# Patient Record
Sex: Female | Born: 1945
Health system: Southern US, Community
[De-identification: ages and names within clinical notes are randomized; demographics above are authoritative.]

## PROBLEM LIST (undated history)

## (undated) DIAGNOSIS — M1990 Unspecified osteoarthritis, unspecified site: Secondary | ICD-10-CM

## (undated) DIAGNOSIS — I4510 Unspecified right bundle-branch block: Secondary | ICD-10-CM

## (undated) DIAGNOSIS — E785 Hyperlipidemia, unspecified: Secondary | ICD-10-CM

## (undated) DIAGNOSIS — Z8673 Personal history of transient ischemic attack (TIA), and cerebral infarction without residual deficits: Secondary | ICD-10-CM

## (undated) DIAGNOSIS — E119 Type 2 diabetes mellitus without complications: Secondary | ICD-10-CM

## (undated) DIAGNOSIS — F32A Depression, unspecified: Secondary | ICD-10-CM

## (undated) DIAGNOSIS — I2510 Atherosclerotic heart disease of native coronary artery without angina pectoris: Secondary | ICD-10-CM

## (undated) DIAGNOSIS — E039 Hypothyroidism, unspecified: Secondary | ICD-10-CM

## (undated) DIAGNOSIS — I471 Supraventricular tachycardia, unspecified: Secondary | ICD-10-CM

## (undated) DIAGNOSIS — F319 Bipolar disorder, unspecified: Secondary | ICD-10-CM

## (undated) DIAGNOSIS — E538 Deficiency of other specified B group vitamins: Secondary | ICD-10-CM

## (undated) DIAGNOSIS — K219 Gastro-esophageal reflux disease without esophagitis: Secondary | ICD-10-CM

## (undated) DIAGNOSIS — E063 Autoimmune thyroiditis: Secondary | ICD-10-CM

## (undated) DIAGNOSIS — I10 Essential (primary) hypertension: Secondary | ICD-10-CM

## (undated) HISTORY — DX: Essential (primary) hypertension: I10

## (undated) HISTORY — DX: Personal history of transient ischemic attack (TIA), and cerebral infarction without residual deficits: Z86.73

## (undated) HISTORY — DX: Bipolar disorder, unspecified: F31.9

## (undated) HISTORY — DX: Type 2 diabetes mellitus without complications: E11.9

## (undated) HISTORY — DX: Autoimmune thyroiditis: E06.3

## (undated) HISTORY — DX: Hyperlipidemia, unspecified: E78.5

## (undated) HISTORY — DX: Deficiency of other specified B group vitamins: E53.8

## (undated) HISTORY — DX: Gastro-esophageal reflux disease without esophagitis: K21.9

## (undated) HISTORY — DX: Supraventricular tachycardia, unspecified: I47.10

## (undated) HISTORY — DX: Supraventricular tachycardia: I47.1

---

## 1997-10-03 ENCOUNTER — Other Ambulatory Visit: Admission: RE | Admit: 1997-10-03 | Discharge: 1997-10-03 | Payer: Self-pay | Admitting: Obstetrics and Gynecology

## 1998-08-14 ENCOUNTER — Ambulatory Visit (HOSPITAL_COMMUNITY): Admission: RE | Admit: 1998-08-14 | Discharge: 1998-08-14 | Payer: Self-pay | Admitting: Gastroenterology

## 1998-11-20 ENCOUNTER — Other Ambulatory Visit: Admission: RE | Admit: 1998-11-20 | Discharge: 1998-11-20 | Payer: Self-pay | Admitting: Obstetrics and Gynecology

## 1999-10-09 ENCOUNTER — Encounter: Payer: Self-pay | Admitting: Obstetrics and Gynecology

## 1999-10-09 ENCOUNTER — Other Ambulatory Visit: Admission: RE | Admit: 1999-10-09 | Discharge: 1999-10-09 | Payer: Self-pay | Admitting: Obstetrics and Gynecology

## 1999-10-09 ENCOUNTER — Encounter: Admission: RE | Admit: 1999-10-09 | Discharge: 1999-10-09 | Payer: Self-pay | Admitting: Obstetrics and Gynecology

## 2000-10-31 ENCOUNTER — Encounter: Payer: Self-pay | Admitting: Obstetrics and Gynecology

## 2000-10-31 ENCOUNTER — Encounter: Admission: RE | Admit: 2000-10-31 | Discharge: 2000-10-31 | Payer: Self-pay | Admitting: Obstetrics and Gynecology

## 2000-10-31 ENCOUNTER — Other Ambulatory Visit: Admission: RE | Admit: 2000-10-31 | Discharge: 2000-10-31 | Payer: Self-pay | Admitting: Obstetrics and Gynecology

## 2001-04-13 ENCOUNTER — Encounter: Payer: Self-pay | Admitting: Family Medicine

## 2001-04-13 ENCOUNTER — Ambulatory Visit (HOSPITAL_COMMUNITY): Admission: RE | Admit: 2001-04-13 | Discharge: 2001-04-13 | Payer: Self-pay | Admitting: Family Medicine

## 2001-06-29 ENCOUNTER — Encounter: Payer: Self-pay | Admitting: Gastroenterology

## 2001-06-29 ENCOUNTER — Encounter: Admission: RE | Admit: 2001-06-29 | Discharge: 2001-06-29 | Payer: Self-pay | Admitting: Gastroenterology

## 2001-11-16 ENCOUNTER — Other Ambulatory Visit: Admission: RE | Admit: 2001-11-16 | Discharge: 2001-11-16 | Payer: Self-pay | Admitting: Obstetrics and Gynecology

## 2001-12-06 ENCOUNTER — Encounter: Admission: RE | Admit: 2001-12-06 | Discharge: 2001-12-06 | Payer: Self-pay | Admitting: Obstetrics and Gynecology

## 2001-12-06 ENCOUNTER — Encounter: Payer: Self-pay | Admitting: Obstetrics and Gynecology

## 2002-11-27 ENCOUNTER — Other Ambulatory Visit: Admission: RE | Admit: 2002-11-27 | Discharge: 2002-11-27 | Payer: Self-pay | Admitting: Obstetrics and Gynecology

## 2002-11-27 ENCOUNTER — Encounter: Admission: RE | Admit: 2002-11-27 | Discharge: 2002-11-27 | Payer: Self-pay | Admitting: Obstetrics and Gynecology

## 2003-12-09 ENCOUNTER — Encounter: Admission: RE | Admit: 2003-12-09 | Discharge: 2003-12-09 | Payer: Self-pay | Admitting: Obstetrics and Gynecology

## 2005-03-17 ENCOUNTER — Ambulatory Visit (HOSPITAL_COMMUNITY): Admission: RE | Admit: 2005-03-17 | Discharge: 2005-03-17 | Payer: Self-pay | Admitting: Family Medicine

## 2006-10-31 ENCOUNTER — Ambulatory Visit (HOSPITAL_COMMUNITY): Admission: RE | Admit: 2006-10-31 | Discharge: 2006-10-31 | Payer: Self-pay | Admitting: Family Medicine

## 2007-01-26 HISTORY — PX: THYROIDECTOMY: SHX17

## 2007-07-18 ENCOUNTER — Encounter: Admission: RE | Admit: 2007-07-18 | Discharge: 2007-07-18 | Payer: Self-pay | Admitting: Endocrinology

## 2007-07-25 ENCOUNTER — Encounter: Admission: RE | Admit: 2007-07-25 | Discharge: 2007-07-25 | Payer: Self-pay | Admitting: Endocrinology

## 2007-07-25 ENCOUNTER — Other Ambulatory Visit: Admission: RE | Admit: 2007-07-25 | Discharge: 2007-07-25 | Payer: Self-pay | Admitting: Interventional Radiology

## 2007-07-25 ENCOUNTER — Encounter (INDEPENDENT_AMBULATORY_CARE_PROVIDER_SITE_OTHER): Payer: Self-pay | Admitting: Interventional Radiology

## 2007-10-19 ENCOUNTER — Encounter: Payer: Self-pay | Admitting: Cardiology

## 2007-10-24 ENCOUNTER — Inpatient Hospital Stay (HOSPITAL_COMMUNITY): Admission: RE | Admit: 2007-10-24 | Discharge: 2007-10-25 | Payer: Self-pay | Admitting: Surgery

## 2007-10-27 ENCOUNTER — Ambulatory Visit (HOSPITAL_COMMUNITY): Admission: RE | Admit: 2007-10-27 | Discharge: 2007-10-27 | Payer: Self-pay | Admitting: Surgery

## 2008-01-26 HISTORY — PX: LAPAROSCOPIC CHOLECYSTECTOMY: SUR755

## 2008-05-01 ENCOUNTER — Ambulatory Visit (HOSPITAL_COMMUNITY): Admission: RE | Admit: 2008-05-01 | Discharge: 2008-05-01 | Payer: Self-pay | Admitting: Family Medicine

## 2008-05-07 ENCOUNTER — Encounter: Admission: RE | Admit: 2008-05-07 | Discharge: 2008-05-07 | Payer: Self-pay | Admitting: Surgery

## 2008-05-31 ENCOUNTER — Encounter (INDEPENDENT_AMBULATORY_CARE_PROVIDER_SITE_OTHER): Payer: Self-pay | Admitting: Surgery

## 2008-05-31 ENCOUNTER — Ambulatory Visit (HOSPITAL_COMMUNITY): Admission: RE | Admit: 2008-05-31 | Discharge: 2008-06-02 | Payer: Self-pay | Admitting: Surgery

## 2008-05-31 ENCOUNTER — Encounter: Payer: Self-pay | Admitting: Cardiology

## 2009-04-22 ENCOUNTER — Encounter: Admission: RE | Admit: 2009-04-22 | Discharge: 2009-04-22 | Payer: Self-pay | Admitting: Neurology

## 2009-04-22 ENCOUNTER — Encounter: Payer: Self-pay | Admitting: Cardiology

## 2009-05-31 ENCOUNTER — Encounter: Payer: Self-pay | Admitting: Cardiology

## 2009-09-30 ENCOUNTER — Encounter: Payer: Self-pay | Admitting: Cardiology

## 2009-11-06 ENCOUNTER — Ambulatory Visit: Payer: Self-pay | Admitting: Cardiology

## 2009-11-06 DIAGNOSIS — Z8669 Personal history of other diseases of the nervous system and sense organs: Secondary | ICD-10-CM | POA: Insufficient documentation

## 2009-11-06 DIAGNOSIS — R002 Palpitations: Secondary | ICD-10-CM | POA: Insufficient documentation

## 2009-11-06 DIAGNOSIS — I10 Essential (primary) hypertension: Secondary | ICD-10-CM | POA: Insufficient documentation

## 2009-11-06 DIAGNOSIS — E785 Hyperlipidemia, unspecified: Secondary | ICD-10-CM | POA: Insufficient documentation

## 2010-02-16 ENCOUNTER — Encounter: Payer: Self-pay | Admitting: Family Medicine

## 2010-02-18 ENCOUNTER — Other Ambulatory Visit: Payer: Self-pay | Admitting: Gastroenterology

## 2010-02-24 NOTE — Consult Note (Signed)
Summary: Consultation Report  Consultation Report   Imported By: Dorise Hiss 11/06/2009 10:19:16  _____________________________________________________________________  External Attachment:    Type:   Image     Comment:   External Document

## 2010-02-24 NOTE — Assessment & Plan Note (Signed)
Summary: NP-DIAPHOR - SOB   Visit Type:  Initial Consult Referring Provider:  Dr. Avie Echevaria Primary Provider:  Dr. Dwana Melena   History of Present Illness: 65 year old woman presents for cardiology consultation. Records are reviewed with history outlined below. She is referred specifically with complaints of episodic diaphoresis and palpitations. She correlates these symptoms to the duration of time that she has been on Plavix, beginning approximately 6 months ago. She describes sudden sweats that occur without known precipitant, no specific rash however. She also reports a feeling of a very forceful heartbeat, not necessarily rapid, and not clearly associated with dizziness or syncope. She states that she stopped Plavix with the approval of Dr. Sandria Manly over the last week or so, and indicates that the symptoms have improved. She states that she almost canceled this visit because she was feeling better.  I also reviewed other reported symptoms. She describes a fairly long-standing, recurrent discomfort in the right subcostal area. This is sporadic and nonexertional. She reports a long-standing history of recurrent syncope since the 1970s, possibly neurocardiogenic based on description. She reports ongoing problems with anxiety, followup with a psychiatrist, and in fact recent recommendation to advance Klonopin dosing. She experiences intermittent forgetfulness, no recent visual changes.  Ms. Dolder indicates that she has no health insurance, and is not particularly interested in pursuing any testing at this time.  She states that she underwent cardiac evaluation several years ago by Dr. Daleen Squibb, including stress testing which by her recollection was normal. I can locate no records at this time.  Preventive Screening-Counseling & Management  Alcohol-Tobacco     Smoking Status: quit     Year Quit: 1985     Pack years: 48yrs/ 1pk per day      Drug Use:  no.    Current Medications (verified): 1)   Lovastatin 20 Mg Tabs (Lovastatin) .... Take 1 Tablet By Mouth Once A Day 2)  Synthroid 100 Mcg Tabs (Levothyroxine Sodium) .... Take 1 Tablet By Mouth Once A Day 3)  Budeprion Xl 150 Mg Xr24h-Tab (Bupropion Hcl) .... Take 1 Tablet By Mouth Once A Day 4)  Align  Caps (Probiotic Product) .... Take 1 Tablet By Mouth Once A Day 5)  Dexilant 60 Mg Cpdr (Dexlansoprazole) .... Take 1 Tablet By Mouth Once A Day 6)  Venlafaxine Hcl 150 Mg Xr24h-Cap (Venlafaxine Hcl) .... Take 1 Tablet By Mouth Once A Day 7)  Promethazine Hcl 25 Mg Tabs (Promethazine Hcl) .... Take One By Mouth Every 4-6 Hours As Needed Nausea 8)  Hyomax-Sl 0.125 Mg Subl (Hyoscyamine Sulfate) .... Take 1 Tablet By Mouth Four Times A Day As Needed Cramps 9)  Clonazepam 1 Mg Tabs (Clonazepam) .... Take 1/2-1 Tablet By Mouth Three Times A Day 10)  Cyanocobalamin 1000 Mcg/ml Soln (Cyanocobalamin) .Marland Kitchen.. 1ml Sq Monthly 11)  Vitamin D 1000 Unit Tabs (Cholecalciferol) .... Take 1 Tablet By Mouth Once A Day 12)  Aleve 220 Mg Tabs (Naproxen Sodium) .... Take 1 Tablet By Mouth Two Times A Day 13)  Caltrate 600+d 600-400 Mg-Unit Tabs (Calcium Carbonate-Vitamin D) .... Take 1 Tablet By Mouth Once A Day 14)  Aspir-Low 81 Mg Tbec (Aspirin) .... Take 1 Tablet By Mouth Once A Day 15)  Folic Acid 800 Mcg Tabs (Folic Acid) .... Take 1 Tablet By Mouth Once A Day 16)  Omnaris 50 Mcg/act Susp (Ciclesonide) .... 2 Spray/nostrils Daily  Allergies (verified): 1)  ! Augmentin 2)  ! Sulfa 3)  ! Jonne Ply  Comments:  Nurse/Medical Assistant: The patient's medication bottles and allergies were reviewed with the patient and were updated in the Medication and Allergy Lists.  Past History:  Family History: Last updated: 11/06/2009 Father: committed suicide at age 52 Mother: died age 72 with stroke Siblings: Sister died age 48 with brain aneurysm, Brother with reported heart disease under age 64  Social History: Last updated: 11/06/2009 Alcohol Use - no Drug  Use - no Married - homemaker Tobacco Use - Former (quit 30 years ago)  Past Medical History: Hyperlipidemia Hypertension Bipolar disorder Hashimoto's thyroiditis Allergic Rhinitis G E R D History of TIA versus migraine as well as right amaurosis fugax and ataxia - Dr. Sandria Manly B12 deficiency  Past Surgical History: Laparoscopic cholecystectomy 2010 Thyroidectomy 2009  Family History: Father: committed suicide at age 48 Mother: died age 43 with stroke Siblings: Sister died age 26 with brain aneurysm, Brother with reported heart disease under age 62  Social History: Alcohol Use - no Drug Use - no Married - homemaker Tobacco Use - Former (quit 30 years ago) Drug Use:  no Smoking Status:  quit Pack years:  36yrs/ 1pk per day  Review of Systems  The patient denies anorexia, fever, chest pain, dyspnea on exertion, peripheral edema, prolonged cough, abdominal pain, melena, hematochezia, and severe indigestion/heartburn.         Reports cataracts in the past. Occasional joint aches. Eczema. Prior hemorrhoids. Otherwise negative except as already outlined above.  Vital Signs:  Patient profile:   65 year old female Height:      61 inches Weight:      176 pounds BMI:     33.38 Pulse rate:   71 / minute BP sitting:   118 / 68  (left arm) Cuff size:   large  Vitals Entered By: Carlye Grippe (November 06, 2009 2:31 PM)  Nutrition Counseling: Patient's BMI is greater than 25 and therefore counseled on weight management options.  Physical Exam  Additional Exam:  Overweight woman in no acute distress. HEENT: Conjunctiva and lids normal, oropharynx with moist mucosa. Neck: Supple, no elevated JVP or carotid bruits. Lungs: Clear to auscultation, nonlabored. Cardiac: Regular rate and rhythm, soft systolic murmur at the base with preserved second heart sound, no pericardial rub or S3 gallop. Abdomen: Soft, nontender, bowel sounds present. Skin: Warm and dry. Musculoskeletal: No  kyphosis. Extremities: No pitting edema, distal pulses full. Neuropsychiatric: Alert and oriented x3, animated but grossly appropriate affect.   Problems:  Medical Problems Added: 1)  Dx of Syncope, Hx of  (ICD-V12.49) 2)  Dx of Palpitations  (ICD-785.1) 3)  Dx of Bipolar Disorder Unspecified  (ICD-296.80) 4)  Dx of Ibs  (ICD-564.1) 5)  Dx of Fatigue  (ICD-780.79) 6)  Dx of Essential Hypertension, Benign  (ICD-401.1) 7)  Dx of Hyperlipidemia  (ICD-272.4) 8)  Dx of Dm  (ICD-250.00)  CXR  Procedure date:  10/19/2007  Findings:       Clinical Data: Preop for thyroid nodule    CHEST - 2 VIEW    Comparison: No priors    Findings: Heart and mediastinal contours normal.  Minimal scarring   at the right medial lung base.  No active process.  Osseous   structures intact.    IMPRESSION:   Minimal scarring at the right medial lung base - no active disease.    Read By:  Bernerd Limbo,  M.D.    EKG  Procedure date:  11/06/2009  Findings:      Normal sinus rhythm  at 74 beats per minute with normal intervals.  Impression & Recommendations:  Problem # 1:  PALPITATIONS (ICD-785.1)  Patient reports episodic feeling of a forceful heartbeat, not necessarily very rapid, and not specifically associated with dizziness or syncope. No clear precipitant for these symptoms. She does seem to relate them loosely to some episodes of diaphoresis that she has experienced over the last several months. The symptoms have improved having stopped Plavix approximately one week ago. While not a common complaint that I hear from patients related to Plavix, this is listed as a potential side effect. Resting electrocardiogram is normal. I did discuss with her the possibility of transient dysrhythmias, and the potential for further testing to include cardiac monitoring and possibly an echocardiogram. At this point she did not want to proceed with any additional testing and preferred observation off Plavix.  She will continue to followup with Dr. Margo Aye. If her symptoms progress, we can certainly see her back and discuss additional evaluation, assuming she is willing to proceed with further testing.  Her updated medication list for this problem includes:    Aspir-low 81 Mg Tbec (Aspirin) .Marland Kitchen... Take 1 tablet by mouth once a day  Problem # 2:  SYNCOPE, HX OF (ICD-V12.49)  Possibly neurocardiogenic based on patient's description. She has had recurrences dating back to the 1970s by her account.  Other Orders: EKG w/ Interpretation (93000)  Patient Instructions: 1)  Follow up as needed.

## 2010-02-24 NOTE — Letter (Signed)
Summary: External Correspondence/ OFFICE VISIT DR. ZACK HALL  External Correspondence/ OFFICE VISIT DR. ZACK HALL   Imported By: Dorise Hiss 10/21/2009 14:57:36  _____________________________________________________________________  External Attachment:    Type:   Image     Comment:   External Document

## 2010-02-24 NOTE — Op Note (Signed)
Summary: Operative Report  Operative Report   Imported By: Dorise Hiss 11/06/2009 10:22:03  _____________________________________________________________________  External Attachment:    Type:   Image     Comment:   External Document

## 2010-03-10 ENCOUNTER — Other Ambulatory Visit: Payer: Self-pay | Admitting: Gastroenterology

## 2010-03-10 DIAGNOSIS — R634 Abnormal weight loss: Secondary | ICD-10-CM

## 2010-03-13 ENCOUNTER — Ambulatory Visit
Admission: RE | Admit: 2010-03-13 | Discharge: 2010-03-13 | Disposition: A | Discharge status: Home / Self Care | Payer: Self-pay | Source: Ambulatory Visit | Attending: Gastroenterology | Admitting: Gastroenterology

## 2010-03-13 DIAGNOSIS — R634 Abnormal weight loss: Secondary | ICD-10-CM

## 2010-03-13 MED ORDER — IOHEXOL 300 MG/ML  SOLN
100.0000 mL | Freq: Once | INTRAMUSCULAR | Status: AC | PRN
  Administered 2010-03-13: 100 mL via INTRAVENOUS

## 2010-03-13 MED ORDER — IOHEXOL 300 MG/ML  SOLN: 100.0000 mL | Freq: Once | INTRAMUSCULAR | Status: AC | PRN

## 2010-04-21 ENCOUNTER — Other Ambulatory Visit: Payer: Self-pay | Admitting: Gastroenterology

## 2010-04-28 ENCOUNTER — Ambulatory Visit
Admission: RE | Admit: 2010-04-28 | Discharge: 2010-04-28 | Disposition: A | Discharge status: Home / Self Care | Payer: Self-pay | Source: Ambulatory Visit | Attending: Gastroenterology | Admitting: Gastroenterology

## 2010-05-05 LAB — COMPREHENSIVE METABOLIC PANEL
ALT: 11 U/L (ref 0–35)
ALT: 22 U/L (ref 0–35)
ALT: 23 U/L (ref 0–35)
AST: 17 U/L (ref 0–37)
AST: 33 U/L (ref 0–37)
AST: 59 U/L — ABNORMAL HIGH (ref 0–37)
Albumin: 2.9 g/dL — ABNORMAL LOW (ref 3.5–5.2)
Albumin: 3.2 g/dL — ABNORMAL LOW (ref 3.5–5.2)
Alkaline Phosphatase: 72 U/L (ref 39–117)
Alkaline Phosphatase: 76 U/L (ref 39–117)
Alkaline Phosphatase: 81 U/L (ref 39–117)
BUN: 3 mg/dL — ABNORMAL LOW (ref 6–23)
BUN: 3 mg/dL — ABNORMAL LOW (ref 6–23)
BUN: 4 mg/dL — ABNORMAL LOW (ref 6–23)
CO2: 27 mEq/L (ref 19–32)
CO2: 28 mEq/L (ref 19–32)
Calcium: 8.5 mg/dL (ref 8.4–10.5)
Calcium: 8.6 mg/dL (ref 8.4–10.5)
Calcium: 9 mg/dL (ref 8.4–10.5)
Chloride: 104 mEq/L (ref 96–112)
Chloride: 105 mEq/L (ref 96–112)
Chloride: 105 mEq/L (ref 96–112)
Creatinine, Ser: 0.7 mg/dL (ref 0.4–1.2)
Creatinine, Ser: 0.74 mg/dL (ref 0.4–1.2)
GFR calc Af Amer: >60 mL/min (ref >60)
GFR calc Af Amer: >60 mL/min (ref >60)
GFR calc non Af Amer: >60 mL/min (ref >60)
GFR calc non Af Amer: >60 mL/min (ref >60)
GFR calc non Af Amer: >60 mL/min (ref >60)
Glucose, Bld: 105 mg/dL — ABNORMAL HIGH (ref 70–99)
Glucose, Bld: 120 mg/dL — ABNORMAL HIGH (ref 70–99)
Potassium: 3.6 mEq/L (ref 3.5–5.1)
Potassium: 3.7 mEq/L (ref 3.5–5.1)
Sodium: 137 mEq/L (ref 135–145)
Sodium: 138 mEq/L (ref 135–145)
Total Bilirubin: 0.4 mg/dL (ref 0.3–1.2)
Total Bilirubin: 0.5 mg/dL (ref 0.3–1.2)
Total Bilirubin: 0.7 mg/dL (ref 0.3–1.2)
Total Protein: 5.8 g/dL — ABNORMAL LOW (ref 6.0–8.3)
Total Protein: 5.9 g/dL — ABNORMAL LOW (ref 6.0–8.3)
Total Protein: 6.6 g/dL (ref 6.0–8.3)

## 2010-05-05 LAB — HEPATIC FUNCTION PANEL
ALT: 24 U/L (ref 0–35)
Bilirubin, Direct: 0.1 mg/dL (ref 0.0–0.3)
Indirect Bilirubin: 0.6 mg/dL (ref 0.3–0.9)
Total Protein: 5.8 g/dL — ABNORMAL LOW (ref 6.0–8.3)

## 2010-05-05 LAB — CBC
HCT: 32.9 % — ABNORMAL LOW (ref 36.0–46.0)
HCT: 33.8 % — ABNORMAL LOW (ref 36.0–46.0)
Hemoglobin: 11.5 g/dL — ABNORMAL LOW (ref 12.0–15.0)
MCHC: 34.2 g/dL (ref 30.0–36.0)
MCHC: 34.5 g/dL (ref 30.0–36.0)
MCHC: 35.6 g/dL (ref 30.0–36.0)
MCV: 88.3 fL (ref 78.0–100.0)
MCV: 89.3 fL (ref 78.0–100.0)
Platelets: 338 10*3/uL (ref 150–400)
Platelets: 349 10*3/uL (ref 150–400)
RBC: 3.62 MIL/uL — ABNORMAL LOW (ref 3.87–5.11)
RBC: 4.18 MIL/uL (ref 3.87–5.11)
RDW: 13.3 % (ref 11.5–15.5)
RDW: 13.6 % (ref 11.5–15.5)
RDW: 13.7 % (ref 11.5–15.5)
WBC: 16.7 10*3/uL — ABNORMAL HIGH (ref 4.0–10.5)
WBC: 17.1 10*3/uL — ABNORMAL HIGH (ref 4.0–10.5)
WBC: 8.5 10*3/uL (ref 4.0–10.5)

## 2010-05-05 LAB — URINALYSIS, ROUTINE W REFLEX MICROSCOPIC
Bilirubin Urine: NEGATIVE
Glucose, UA: NEGATIVE mg/dL
Hgb urine dipstick: NEGATIVE
Ketones, ur: NEGATIVE mg/dL
Nitrite: NEGATIVE
Protein, ur: NEGATIVE mg/dL
Urobilinogen, UA: 0.2 mg/dL (ref 0.0–1.0)

## 2010-05-05 LAB — DIFFERENTIAL
Basophils Relative: 1 % (ref 0–1)
Lymphs Abs: 2.5 10*3/uL (ref 0.7–4.0)
Monocytes Absolute: 0.6 10*3/uL (ref 0.1–1.0)
Monocytes Relative: 8 % (ref 3–12)
Neutro Abs: 4.9 10*3/uL (ref 1.7–7.7)
Neutrophils Relative %: 58 % (ref 43–77)

## 2010-05-05 LAB — TYPE AND SCREEN: ABO/RH(D): A POS

## 2010-05-05 LAB — LIPASE, BLOOD: Lipase: 13 U/L (ref 11–59)

## 2010-06-09 NOTE — Consult Note (Signed)
NAMENEIDY, GUERRIERI NO.:  [id number]   MEDICAL RECORD NO.:  [id number]          PATIENT TYPE:  OIB   LOCATION:  1601                         FACILITY:  [facility]   PHYSICIAN:  Bernette Redbird, M.D.   DATE OF BIRTH:  07-07-1945   DATE OF CONSULTATION:  05/31/2008  DATE OF DISCHARGE:                                 CONSULTATION   REFERRING PHYSICIAN:  Dr. Cyndia Bent   We were asked to see Ms. Cina today in consultation for common bile  duct stones by Dr. Ephriam Knuckles strep of the surgery service.   HISTORY OF PRESENT ILLNESS:  This is a 65 year old female with a history  of diabetes mellitus, manic depression, total thyroidectomy done  September 2009 who reports episodes of epigastric pain in right upper  quadrant, abdominal pain with nausea, increasing gas and bloating,  particularly after eating.  She also reports significant dysphagia to  pills as well as solids, sometimes regurgitating her foods and  medicines.  The patient had a total thyroidectomy in September 2009,  still having symptoms of tightness in the area of her surgery.  A recent  thyroid ultrasound was fortunately negative.  The patient tells me she  had a colonoscopy in 2000 by Dr. Tasia Catchings which was reported to be  normal.   PAST MEDICAL HISTORY:  Significant for hypertension, hyperlipidemia,  type 2 diabetes, IBS, manic depression, Hashimoto's thyroiditis.  She is  status post thyroidectomy October 24, 2007.  Her primary care  physician is Dr. Simone Curia in [place], and her endocrinologist  is Dr. Talmage Nap.   CURRENT MEDICATIONS:  Per chart but include no anticoagulants, no  antiplatelets.   She has allergies to FLAGYL, AUGMENTIN, SULFA AND ASPIRIN.   REVIEW OF SYSTEMS:  Significant for some hearing loss, fatigue,  occasional palpitations, joint pain and tightness in her neck.   SOCIAL HISTORY:  Negative for tobacco and alcohol.   FAMILY HISTORY:  Negative for GI  diseases.   PHYSICAL EXAMINATION:  GENERAL:  She is alert and oriented, very  pleasant to speak with, in no apparent distress.  Her family is in the  room with her. CARDIOVASCULAR SYSTEM:  Mildly tachycardic but has no  murmurs, rubs or gallops.  LUNGS:  Clear to auscultation with no wheezes or crackles.  ABDOMEN:  Soft with no bowel sounds.  Her bandages are clean and dry.   LABORATORY DATA:  Labs done May 28, 2008, are significant for a  hemoglobin of 12.8, hematocrit 37.3, white count 8.5, platelets 427,000.  BMET is within normal limits.  AST 17, ALT 11, alk phos 81, total  bilirubin 0.4.   RADIOLOGICAL EXAMINATIONS:  Include an ultrasound of her abdomen done  May 01, 2008, that showed a gallbladder filled with stones.  Her common  bile duct at that time was 6 mm.  On May 07, 2008, she had an  ultrasound of her thyroid area, post thyroidectomy.  Ultrasound was  normal.  Today, May 31, 2008, she had an intraoperative cholangiogram  that was positive for filling defects suspicious for multiple common  bile duct stones.  ASSESSMENT:  Dr. Molly Maduro Buccini has seen and examined the patient,  collected history and reviewed her chart.  His impression is this is a  65 year old female, status post laparoscopic cholecystectomy today,  common bile duct stones on intraoperative cholangiogram, dysphagia to  solids and pills, thyroidectomy 8 months ago; still symptoms of  tightness remain in her thyroid area.   PLAN:  Upper endoscopy with possible dilation of her esophagus tomorrow.  Endoscopic retrograde cholangiopancreatography immediately following  endoscopy to remove common bile duct stones.  The procedures as well as  the risks, particularly of pancreatitis, sedation, perforation and  bleeding were explained to the patient.  She understands these risks and  consents to the procedures.   Thanks very much for this consultation.      Stephani Police, PA     ______________________________  Bernette Redbird, M.D.    MLY/MEDQ  D:  05/31/2008  T:  06/01/2008  Job:  [id number]   cc:   Donna Bernard, M.D.  Fax: [phone]   Currie Paris, M.D.  [address]  [place]  [place] [address]   Petra Kuba, M.D.  Fax: [phone]

## 2010-06-09 NOTE — Op Note (Signed)
[name], [name]              ACCOUNT NO.:  [id number]   MEDICAL RECORD NO.:  [id number]          PATIENT TYPE:  OIB   LOCATION:  1601                         FACILITY:  [facility]   PHYSICIAN:  John C. Madilyn Fireman, M.D.    DATE OF BIRTH:  1945-07-23   DATE OF PROCEDURE:  06/01/2008  DATE OF DISCHARGE:                               OPERATIVE REPORT   INDICATIONS FOR PROCEDURE:  Common bile duct stones seen on  intraoperative cholangiogram.   PROCEDURE:  The patient was placed in the prone position and placed on  the pulse monitor with continuous low-flow oxygen delivered by nasal  cannula.  She was sedated 100 mcg IV fentanyl, 8 mg IV Versed, 25 mg IV  Phenergan and 1 mg IV glucagon.  The Olympus side-viewing endoscope was  advanced blindly in the oropharynx, esophagus and stomach.  The pylorus  was traversed and papilla of Vater located on the medial duodenal wall.  He had a normal appearance and was cannulated with Wilson-Cook  sphincterotome.  No pancreatic duct cannulation was performed.  A  cholangiogram was obtained which showed a dilated duct and one moderate  sized stone and two or three small fragments.  A large sphincterotomy  was performed, and the stone and smaller fragments were removed.  The  last cholangiogram showed a lot of air from the balloon having popped  but no obvious visible residual stones.  There was good drainage at the  end of the procedure.  Scope was then withdrawn, and the patient  returned to the recovery room in stable condition.  She tolerated the  procedure well.  There were no immediate complications.   IMPRESSION:  Common bile duct stones removed after sphincterotomy.           ______________________________  Everardo All Madilyn Fireman, M.D.     JCH/MEDQ  D:  06/01/2008  T:  06/01/2008  Job:  [id number]   cc:   Currie Paris, M.D.  [address]  [place]  [place] [address]

## 2010-06-09 NOTE — Op Note (Signed)
[name], PARCHMENT NO.:  [id number]   MEDICAL RECORD NO.:  [id number]          PATIENT TYPE:  OIB   LOCATION:  1601                         [facility]ACILITY:  [facility]   PHYSICIAN:  Currie Paris, M.D.DATE OF BIRTH:  [birth year]   DATE OF PROCEDURE:  05/31/2008  DATE OF DISCHARGE:                               OPERATIVE REPORT   OFFICE MEDICAL RECORD NUMBER:  [id number].   PREOPERATIVE DIAGNOSIS:  Chronic calculus cholecystitis.   POSTOPERATIVE DIAGNOSES:  1. Chronic calculus cholecystitis.  2. Choledocholithiasis.   OPERATION PERFORMED:  Laparoscopic cholecystectomy with operative  cholangiogram.   SURGEON:  Dr. Jamey Ripa.   ASSISTANT:  Dr. Zachery Dakins.   ANESTHESIA:  General endotracheal.   CLINICAL HISTORY:  This is a 65 year old lady with biliary type symptoms  and gallstones found.  After discussion with the patient, she elected to  have a cholecystectomy.  The gallbladder ultrasound showed what looked  like a wall echo shadow suggestive of a gallbladder filled with stones.   DESCRIPTION OF PROCEDURE:  The patient was seen in the holding area and  she had no further questions.  She confirmed that cholecystectomy was  the planned procedure.   The patient was taken to the operating room after satisfactory general  endotracheal anesthesia had been obtained.  The abdomen was prepped and  draped and a timeout done.   I started by making an incision in the umbilicus, entering the  peritoneal cavity under direct vision and placing a Hassan.  I used  local for all incisions.   The gallbladder was contracted and covered with omentum.  A 10-11 trocar  was placed in the epigastrium and two 5s laterally.  The patient was  placed in reversed Trendelenburg and tilted to the left.   These adhesions of the omentum were fairly dense and the gallbladder was  quite contracted, white appearing and very firm, consistent with a  gallbladder filled with stones.  The area  of the pylorus was stuck about  halfway up on the gallbladder and carefully taken down sharply with  scissor to avoid cautery injury to the stomach.   Eventually, I was able to free up the ampulla of the gallbladder and  grasped that.  Once that was done, I was then able to start dissecting  in the area of the cystic duct.  Again, it very fibrotic in here.  I did  see initially the common duct and the hepatic artery, so I had those two  structures identified.  I could then see a structure coming off of the  common duct and with some careful dissection and freeing up the  peritoneum on the anterior and posterior side got around the cystic  duct.  It was fairly dilated.  I dissected a little higher and could see  the cystic artery clearly coming off the hepatic artery and dissected  that out.  I put one clip on the cystic artery near its origin, one on  the cystic duct near the gallbladder.   The cystic duct was opened and operative angiography done.  It showed a  dilated common duct  with multiple stones.  The cystic duct actually  looked a little longer than it did visually.  There was good flow in the  duodenum, but clearly several stones.   The cystic duct catheter was removed.  The cystic duct was completely  divided and then ligated doubly with 0 PDS.   The gallbladder was then removed from below to above.  Additional  vessels were noted and clipped.  There was really no plane of dissection  between the back wall and the gallbladder and we entered the gallbladder  was spilled several stones which had to be retrieved at the end of the  case.  I basically had to carve the gallbladder out of the liver.  Once  it was out, we placed it in a bag.  I spent a lot of time irrigating  trying to making sure everything was dry.  A couple of areas that were  oozing on the bed of the gallbladder were cauterized and because of some  oozing, I went ahead and put some Surgicel in.  I waited 5  minutes and  it appeared to be dry, but because of a large raw surface, I also put  some FloSeal on and some additional Surgicel.  After watching this for  few minutes it remained dry, I felt we were okay in terms of no  bleeding.   The lateral ports were removed.  The umbilical port was removed and the  site closed with the pursestring.  The epigastric port was removed.  The  skin was closed with 4-0 Monocryl, subcuticular Dermabond and Steri-  Strips.   The patient tolerated the procedure well and there were no  complications.  Estimated blood loss was about 100 mL.  She was taken to  the recovery room in satisfactory condition.      Currie Paris, M.D.  Electronically Signed     CJS/MEDQ  D:  05/31/2008  T:  06/01/2008  Job:  [id number]   cc:   Donna Bernard, M.D.  Fax: [phone]

## 2010-06-09 NOTE — Op Note (Signed)
NAMEKARLE, DESROSIER NO.:  [id number]   MEDICAL RECORD NO.:  [id number]          PATIENT TYPE:  INP   LOCATION:  0001                         FACILITY:  [facility]   PHYSICIAN:  Currie Paris, M.D.DATE OF BIRTH:  08/06/1945   DATE OF PROCEDURE:  10/24/2007  DATE OF DISCHARGE:                               OPERATIVE REPORT   CCS [phone]   PREOPERATIVE DIAGNOSES:  1. Large right thyroid nodule with atypical cells on biopsy.  2. Possible thyroiditis.   POSTOPERATIVE DIAGNOSES:  1. Large right thyroid nodule with atypical cells on biopsy.  2. Possible thyroiditis.   OPERATION:  Total thyroidectomy.   CLINICAL HISTORY:  This patient is a 65 year old lady who was recently  found to have an approximately 2.5 cm nodule in the mid inferior right  lobe which was dominant.  The left lobe was somewhat smaller but had  some inhomogeneous texture.  After discussion with the patient, we  elected to do a total thyroidectomy.   DESCRIPTION OF PROCEDURE:  Patient was seen in the holding area and had  no further questions.  We reviewed the risks and complications again.   Patient was taken to the operating room.  After satisfactory general  endotracheal anesthesia had been obtained, the neck was prepped and  draped.  A time-out was done.  I made a transverse incision in a skin  fold and divided the platysmal muscle with cautery.  The subplatysmal  flaps were raised, the midline fascia opened, and the thyroid gland  exposed to the right lobe.  A middle vein was clipped.  I rotated the  gland medially and freed up the superior pole vessels and divided those,  ligating with 2-0 silks and clips.  I stayed very close to the thyroid  gland.  The gland was nodular, whitish-appearing, somewhat sclerotic-  looking.  I found some inferior pole vessels that were very superficial  and divided those and a few vessels coming up on the thyroid.  At this  point, I saw what looked like a  parathyroid gland that was clearly  ischemic and was going to have to come out with the thyroid regardless,  as this was fairly anterior.  I took a small biopsy of that and sent  that for frozen section and kept the remainder in some saline for a few  minutes.   I rotated the gland medially, and we located the area of the nerve and  staying well away from that, stayed up on the thyroid gland and divided  the small vessels as they entered the thyroid, staying up in the  surgical plane.  As I got closer where the nerve was entering the  larynx, I was able to stay up on the thyroid and avoid the nerve.  I got  the gland freed up until I was across the midline.   The left lobe was approached in a similar fashion.  There was really no  pyramidal lobe noted.  The left lobe was fairly diminutive and actually  smaller than what it had appeared on the ultrasound.  Nevertheless, we  rotated it medially and again divided the superior pole vessels, found a  few vessels coming into the anterior pole.  Here, I saw what appeared to  be parathyroid glands, and I kept those lateral, again, staying right up  on the gland to avoid injury to parathyroid tissue or the nerve.   Careful palpation of the neck revealed no other thyroid tissue.  There  was nothing to suggest any lymphadenopathy either.  I irrigated to make  sure everything was dry.  There was a small bleeder on the right side,  right as where the nerve entered, and I held some Surgicel on it  for a  few minutes, but it continued to ooze just a little bit, so I elected to  put some FloSeal and then Surgicel on top, and this allowed it to stay  dry.   Once that was accomplished, I closed in layers with 4-0 Vicryl followed  by some staples and Steri-Strips on the skin.  The patient tolerated the  procedure well, and there were no complications.  All counts were  correct.   Prior to closing, we did confirm that the small biopsy showed   parathyroid gland, so I minced the remainder of that parathyroid gland  and implanted it in the right sternohyoid muscle.      Currie Paris, M.D.  Electronically Signed     CJS/MEDQ  D:  10/24/2007  T:  10/24/2007  Job:  [id number]   cc:   Donna Bernard, M.D.  Fax: [phone]   Dorisann Frames, M.D.

## 2010-10-26 LAB — COMPREHENSIVE METABOLIC PANEL
AST: 15
Albumin: 3.8
BUN: 8
Creatinine, Ser: 0.76
GFR calc Af Amer: >60
Total Protein: 6.9

## 2010-10-26 LAB — CBC
HCT: 37.6
MCV: 89.6
Platelets: 427 — ABNORMAL HIGH
RDW: 13.2

## 2010-10-26 LAB — CALCIUM
Calcium: 8.7
Calcium: 8.7

## 2010-10-26 LAB — URINALYSIS, ROUTINE W REFLEX MICROSCOPIC
Bilirubin Urine: NEGATIVE
Glucose, UA: NEGATIVE
Hgb urine dipstick: NEGATIVE
Nitrite: NEGATIVE
Specific Gravity, Urine: 1.017
pH: 7

## 2010-10-26 LAB — DIFFERENTIAL
Lymphocytes Relative: 29
Lymphs Abs: 2.3
Monocytes Absolute: 0.5
Monocytes Relative: 6
Neutro Abs: 5.1

## 2011-01-06 ENCOUNTER — Encounter: Payer: Self-pay | Admitting: Cardiology

## 2011-01-07 ENCOUNTER — Encounter (INDEPENDENT_AMBULATORY_CARE_PROVIDER_SITE_OTHER): Payer: No Typology Code available for payment source | Admitting: *Deleted

## 2011-01-07 ENCOUNTER — Encounter: Payer: No Typology Code available for payment source | Admitting: *Deleted

## 2011-01-07 DIAGNOSIS — R55 Syncope and collapse: Secondary | ICD-10-CM

## 2011-01-07 NOTE — Progress Notes (Unsigned)
Pt presents for holter monitor placement per Dr. Sandria Manly d/t syncope. Monitor placed and instructions for use given to pt. Pt verbalized understanding. She plans to return monitor on 01/11/11.

## 2011-01-11 ENCOUNTER — Telehealth: Payer: Self-pay | Admitting: *Deleted

## 2011-01-11 NOTE — Telephone Encounter (Signed)
Pt left message with Lynden Ang to have nurse call her. Attempted to reach pt. Left message to call back on voicemail.

## 2011-01-15 NOTE — Telephone Encounter (Signed)
Left message for pt to call if she still needs to s/w nurse.

## 2011-01-28 ENCOUNTER — Telehealth: Payer: Self-pay | Admitting: *Deleted

## 2011-01-28 NOTE — Telephone Encounter (Signed)
Pt states she just wanted to report that this week she had a weird experience. She states around 6 pm her heart started pounding. She laid down and it started slowing doing. Then she had chills. She also felt weak. She had a flutter in her chest. She states Dr. Sandria Manly got the results of the heart monitor that he ordered. She also told him that she needed her TSH checked.  Pt notified that she can either schedule appt w/Dr. Diona Browner to reestablish with him, as she has not been seen by him since Oct 2011, or she can notify Dr. Sandria Manly of her concerns, as he has been treating her for these symptoms and ordered the Holter monitor. She states she will d/w Dr. Sandria Manly and call back to schedule if she feels this is necessary.

## 2011-01-28 NOTE — Telephone Encounter (Signed)
Pt left message asking for a return call b/c she would like something that happened noted in her file.

## 2011-02-05 ENCOUNTER — Ambulatory Visit (INDEPENDENT_AMBULATORY_CARE_PROVIDER_SITE_OTHER): Payer: No Typology Code available for payment source | Admitting: Cardiology

## 2011-02-05 ENCOUNTER — Encounter: Payer: Self-pay | Admitting: *Deleted

## 2011-02-05 ENCOUNTER — Encounter: Payer: Self-pay | Admitting: Cardiology

## 2011-02-05 VITALS — BP 121/78 | HR 69 | Ht 61.0 in | Wt 174.0 lb

## 2011-02-05 DIAGNOSIS — I10 Essential (primary) hypertension: Secondary | ICD-10-CM

## 2011-02-05 DIAGNOSIS — I471 Supraventricular tachycardia, unspecified: Secondary | ICD-10-CM

## 2011-02-05 DIAGNOSIS — R0609 Other forms of dyspnea: Secondary | ICD-10-CM | POA: Insufficient documentation

## 2011-02-05 DIAGNOSIS — R0602 Shortness of breath: Secondary | ICD-10-CM

## 2011-02-05 DIAGNOSIS — R0600 Dyspnea, unspecified: Secondary | ICD-10-CM | POA: Insufficient documentation

## 2011-02-05 DIAGNOSIS — R002 Palpitations: Secondary | ICD-10-CM

## 2011-02-05 MED ORDER — ATENOLOL 25 MG PO TABS: 25.0000 mg | ORAL_TABLET | Freq: Every day | ORAL | Status: DC

## 2011-02-05 NOTE — Patient Instructions (Signed)
Follow up as scheduled in [place]. Start Atenolol 25 mg Take 1 tablet by mouth every night.  Your physician has requested that you have a lexiscan myoview. For further information please visit [email or web address] Please follow instruction sheet, as given.  Your physician has requested that you have an echocardiogram. Echocardiography is a painless test that uses sound waves to create images of your heart. It provides your doctor with information about the size and shape of your heart and how well your heart's chambers and valves are working. This procedure takes approximately one hour. There are no restrictions for this procedure.

## 2011-02-05 NOTE — Progress Notes (Signed)
Clinical Summary [name] is a 66 y.o.female presenting to the office for a followup visit. She has not been seen since October 2011 at which time cardiac testing was discussed to further assess palpitations, however she preferred no testing at that point. She has been followed by Dr. Sandria Manly in the interim. He requested a Holter monitor be placed, performed back in December, which revealed sinus rhythm with occasional PVCs and brief bursts of probable PSVT, doubt atrial fibrillation. Heart rate increased to the range of 150-170 beats per minute. There were no pauses.  She reports a fairly chronic history of dyspnea on exertion, intermittent feelings of palpitations described as a forceful heartbeat and also rapid rate, not exclusively when standing. Also reports that she is fatigued most of the time, no reproducible exertional chest pain. Has had episodes where she falls without frank syncope, states that she feels like she is "giving out." It is interesting to note that she experienced none of these symptoms while she was wearing her monitor.  Today we reviewed her monitor results. At this point I am not certain whether she truly is having any PSVT that is symptom provoking. She is on no specific rate lowering medications. Reports that her thyroid status has been stable. She does plan to see Dr. Margo Aye soon for regular visit. She is concerned because of some history of CAD in her family, and we discussed further avenues for cardiac testing, which we had touched on to some degree at her last visit.   Allergies  Allergen Reactions  . Aspirin   . ZOX:[id number] Acid+Aspartame   . Sulfonamide Derivatives     Current Outpatient Prescriptions  Medication Sig Dispense Refill  . acetaminophen (TYLENOL) 500 MG tablet Take 500 mg by mouth every 6 (six) hours as needed.      Marland Kitchen aspirin EC 81 MG tablet Take 81 mg by mouth daily.      . Biotin 1000 MCG tablet Take 1,000 mcg by mouth  daily.      Marland Kitchen buPROPion (WELLBUTRIN XL) 300 MG 24 hr tablet Take 300 mg by mouth daily.      . Cholecalciferol (D3-1000) 1000 UNITS capsule Take 1,000 Units by mouth daily.      . clonazePAM (KLONOPIN) 1 MG tablet Take 0.5-1 mg by mouth 3 (three) times daily.      . Coenzyme Q10 (COQ-10) 200 MG CAPS Take 1 capsule by mouth daily.      . cyanocobalamin (,VITAMIN B-12,) 1000 MCG/ML injection Inject 1,000 mcg into the muscle once.      Marland Kitchen dexlansoprazole (DEXILANT) 60 MG capsule Take 60 mg by mouth daily.      . fluticasone (FLONASE) 50 MCG/ACT nasal spray Place 2 sprays into the nose daily.      . folic acid (FOLVITE) 1 MG tablet Take 1 mg by mouth daily.      . hyoscyamine (LEVSIN, ANASPAZ) 0.125 MG tablet Take 0.125 mg by mouth every 4 (four) hours as needed.      Marland Kitchen levothyroxine (SYNTHROID, LEVOTHROID) 112 MCG tablet Take 112 mcg by mouth daily.      . Probiotic Product (PROBIOTIC FORMULA PO) Take 1 capsule by mouth daily.      . promethazine (PHENERGAN) 25 MG tablet Take 25-50 mg by mouth every 6 (six) hours as needed.      . valsartan (DIOVAN) 80 MG tablet Take 80 mg by mouth daily.      Marland Kitchen venlafaxine (EFFEXOR-XR) 150 MG 24 hr capsule  Take 150 mg by mouth daily.      . vitamin E 200 UNIT capsule Take 200 Units by mouth daily.      Marland Kitchen atenolol (TENORMIN) 25 MG tablet Take 1 tablet (25 mg total) by mouth at bedtime.  30 tablet  6    Past Medical History  Diagnosis Date  . Essential hypertension, benign   . Hyperlipidemia   . Bipolar disorder   . Hashimoto's thyroiditis   . Allergic rhinitis   . GERD (gastroesophageal reflux disease)   . B12 deficiency   . History of transient ischemic attack (TIA)     Or possibly migraine as well as right amaurosis fugax and ataxia - Dr. Sandria Manly    Social History [name] reports that she quit smoking about 28 years ago. Her smoking use included Cigarettes. She has a 5 pack-year smoking history. She has never used smokeless tobacco. [name]  reports that she does not drink alcohol.  Review of Systems Negative except as outlined above.  Physical Examination Filed Vitals:   02/05/11 1038  BP: 121/78  Pulse: 69   Overweight woman in no acute distress.  HEENT: Conjunctiva and lids normal, oropharynx with moist mucosa.  Neck: Supple, no elevated JVP or carotid bruits.  Lungs: Clear to auscultation, nonlabored.  Cardiac: Regular rate and rhythm, soft systolic murmur at the base with preserved second heart sound, no pericardial rub or S3 gallop.  Abdomen: Soft, nontender, bowel sounds present.  Skin: Warm and dry.  Musculoskeletal: No kyphosis.  Extremities: No pitting edema, distal pulses full.  Neuropsychiatric: Alert and oriented x3, animated but grossly appropriate affect.   ECG Normal sinus rhythm at 70 with incomplete right bundle-branch block, normal axis.  Problem List and Plan

## 2011-02-05 NOTE — Assessment & Plan Note (Signed)
Still could be that she is experiencing symptomatic ectopy, although with documentation of possible PSVT as well. It is not however clear that her symptoms are completely explained by this, particularly interesting to note that she did not have any major symptoms while wearing her monitor. We discussed this, and will begin a trial of low-dose atenolol 25 mg in the evening. She will let us know how she tolerates this.

## 2011-02-05 NOTE — Assessment & Plan Note (Signed)
Blood pressure is normal today. 

## 2011-02-05 NOTE — Assessment & Plan Note (Signed)
Seems to be fairly chronic based on description. ECG shows an incomplete right bundle branch block. She has a soft systolic murmur on examination. Some reported history of CAD in the family with personal cardiac risk factors including hypertension and hyperlipidemia. She is more inclined to pursue further testing at this point, and we discussed proceeding with a Lexiscan Cardiolite for ischemic evaluation. Will bring her back to discuss the results. This will be arranged in [place].

## 2011-02-08 ENCOUNTER — Other Ambulatory Visit: Payer: Self-pay | Admitting: *Deleted

## 2011-02-08 DIAGNOSIS — I471 Supraventricular tachycardia: Secondary | ICD-10-CM

## 2011-02-09 ENCOUNTER — Other Ambulatory Visit: Payer: Self-pay | Admitting: *Deleted

## 2011-02-09 DIAGNOSIS — R0602 Shortness of breath: Secondary | ICD-10-CM

## 2011-02-11 ENCOUNTER — Ambulatory Visit (HOSPITAL_COMMUNITY)
Admission: RE | Admit: 2011-02-11 | Discharge: 2011-02-11 | Disposition: A | Discharge status: Home / Self Care | Payer: Self-pay | Source: Ambulatory Visit | Attending: Cardiology | Admitting: Cardiology

## 2011-02-11 ENCOUNTER — Encounter: Payer: No Typology Code available for payment source | Admitting: *Deleted

## 2011-02-11 ENCOUNTER — Encounter (HOSPITAL_COMMUNITY): Payer: Self-pay

## 2011-02-11 ENCOUNTER — Ambulatory Visit (INDEPENDENT_AMBULATORY_CARE_PROVIDER_SITE_OTHER): Payer: Self-pay | Admitting: *Deleted

## 2011-02-11 DIAGNOSIS — E785 Hyperlipidemia, unspecified: Secondary | ICD-10-CM | POA: Insufficient documentation

## 2011-02-11 DIAGNOSIS — R0602 Shortness of breath: Secondary | ICD-10-CM

## 2011-02-11 DIAGNOSIS — I471 Supraventricular tachycardia: Secondary | ICD-10-CM

## 2011-02-11 DIAGNOSIS — I4891 Unspecified atrial fibrillation: Secondary | ICD-10-CM | POA: Insufficient documentation

## 2011-02-11 DIAGNOSIS — R0600 Dyspnea, unspecified: Secondary | ICD-10-CM

## 2011-02-11 DIAGNOSIS — I369 Nonrheumatic tricuspid valve disorder, unspecified: Secondary | ICD-10-CM

## 2011-02-11 DIAGNOSIS — R002 Palpitations: Secondary | ICD-10-CM

## 2011-02-11 DIAGNOSIS — I10 Essential (primary) hypertension: Secondary | ICD-10-CM | POA: Insufficient documentation

## 2011-02-11 MED ORDER — TECHNETIUM TC 99M TETROFOSMIN IV KIT
10.0000 | PACK | Freq: Once | INTRAVENOUS | Status: AC | PRN
  Administered 2011-02-11: 10 via INTRAVENOUS

## 2011-02-11 MED ORDER — TECHNETIUM TC 99M TETROFOSMIN IV KIT
30.0000 | PACK | Freq: Once | INTRAVENOUS | Status: AC | PRN
  Administered 2011-02-11: 29.3 via INTRAVENOUS

## 2011-02-11 NOTE — Progress Notes (Signed)
*  PRELIMINARY RESULTS* Echocardiogram 2D Echocardiogram has been performed.  Conrad Low Moor 02/11/2011, 8:42 AM

## 2011-02-11 NOTE — Progress Notes (Signed)
Stress Lab Nurses Notes - [facility]  ANALICIA SKIBINSKI 02/11/2011  Reason for doing test: Arrhythmia/Palpitations and Dyspnea Type of test: Steffanie Dunn Nurse performing test: Parke Poisson, RN Nuclear Medicine Tech: Lyndel Pleasure Echo Tech: Not Applicable MD performing test: Ival Bible & Joni Reining NP Family MD: Margo Aye Test explained and consent signed: yes IV started: 22g jelco, Saline lock flushed, No redness or edema and Saline lock started in radiology Symptoms: Flushed & stomach discomfort Treatment/Intervention: None Reason test stopped: protocol completed After recovery IV was: Discontinued via X-ray tech and No redness or edema Patient to return to Nuc. Med at : 12:30 Patient discharged: Home Patient's Condition upon discharge was: stable Comments: During test BP 118/62 & HR 79.  Recovery BP 110/58 & HR 73.  Symptoms resolved in recovery. Erskine Speed T

## 2011-02-24 ENCOUNTER — Telehealth: Payer: Self-pay | Admitting: Cardiology

## 2011-02-24 NOTE — Telephone Encounter (Signed)
Results called again to patient of Stress Test.

## 2011-02-24 NOTE — Telephone Encounter (Signed)
Patient states that she got a letter that her Echo was normal but has not heard anything about her stress test.  Both of these tests were ordered from the [place] office.  Patient states that result letter was sent from here though.  Wants to know if she needs to call the [place] office for results of Myoview. / tg

## 2011-03-12 ENCOUNTER — Ambulatory Visit (INDEPENDENT_AMBULATORY_CARE_PROVIDER_SITE_OTHER): Payer: Self-pay | Admitting: Cardiology

## 2011-03-12 ENCOUNTER — Encounter: Payer: Self-pay | Admitting: Cardiology

## 2011-03-12 VITALS — BP 134/77 | HR 69 | Resp 16 | Ht 62.0 in | Wt 176.0 lb

## 2011-03-12 DIAGNOSIS — I10 Essential (primary) hypertension: Secondary | ICD-10-CM

## 2011-03-12 DIAGNOSIS — I471 Supraventricular tachycardia, unspecified: Secondary | ICD-10-CM

## 2011-03-12 NOTE — Assessment & Plan Note (Signed)
Otherwise no change to current regimen.

## 2011-03-12 NOTE — Patient Instructions (Signed)
**Note De-identified  Obfuscation** Your physician recommends that you continue on your current medications as directed. Please refer to the Current Medication list given to you today.  Your physician recommends that you schedule a follow-up appointment in: 6 months  

## 2011-03-12 NOTE — Progress Notes (Signed)
Clinical Summary Ms. Gowans is a 66 y.o.female presenting for followup. She was seen in .January. At that time she was placed on Atenolol for PSVT and palpitations, also scheduled for testing.  Echocardiogram showed LVEF 65-70% with no major valvular abnormalities. Cardiolite was low risk without evidence of ischemia. We reviewed these results again today.  She reports no chest pain, still complains of being fatigued and under stress. She is still experiencing palpitations, perhaps somewhat better on atenolol. We discussed the matter, and at this point she was most comfortable with observation without making any further medication adjustments.   Allergies  Allergen Reactions  . Aspirin   . YNW:GNFAOZHYQMV+HQIONGEXB+MWUXLKGMWN Acid+Aspartame   . Sulfonamide Derivatives     Current Outpatient Prescriptions  Medication Sig Dispense Refill  . acetaminophen (TYLENOL) 500 MG tablet Take 500 mg by mouth every 6 (six) hours as needed.      Marland Kitchen aspirin EC 81 MG tablet Take 81 mg by mouth daily.      Marland Kitchen atenolol (TENORMIN) 25 MG tablet Take 1 tablet (25 mg total) by mouth at bedtime.  30 tablet  6  . Biotin 1000 MCG tablet Take 1,000 mcg by mouth daily.      Marland Kitchen buPROPion (WELLBUTRIN XL) 300 MG 24 hr tablet Take 150 mg by mouth daily.       . Cholecalciferol (D3-1000) 1000 UNITS capsule Take 1,000 Units by mouth daily.      . clonazePAM (KLONOPIN) 1 MG tablet Take 0.5-1 mg by mouth 3 (three) times daily.      . Coenzyme Q10 (COQ-10) 200 MG CAPS Take 1 capsule by mouth daily.      . cyanocobalamin (,VITAMIN B-12,) 1000 MCG/ML injection Inject 1,000 mcg into the muscle once.      Marland Kitchen dexlansoprazole (DEXILANT) 60 MG capsule Take 60 mg by mouth daily.      . fluticasone (FLONASE) 50 MCG/ACT nasal spray Place 2 sprays into the nose daily.      . folic acid (FOLVITE) 1 MG tablet Take 1 mg by mouth daily.      . hyoscyamine (LEVSIN, ANASPAZ) 0.125 MG tablet Take 0.125 mg by mouth every 4 (four) hours as  needed.      Marland Kitchen levothyroxine (SYNTHROID, LEVOTHROID) 112 MCG tablet Take 112 mcg by mouth daily.      . Probiotic Product (PROBIOTIC FORMULA PO) Take 1 capsule by mouth daily.      . promethazine (PHENERGAN) 25 MG tablet Take 25-50 mg by mouth every 6 (six) hours as needed.      . valsartan (DIOVAN) 80 MG tablet Take 80 mg by mouth daily.      Marland Kitchen venlafaxine (EFFEXOR-XR) 150 MG 24 hr capsule Take 150 mg by mouth daily.      . vitamin E 200 UNIT capsule Take 200 Units by mouth daily.        Past Medical History  Diagnosis Date  . Essential hypertension, benign   . Hyperlipidemia   . Bipolar disorder   . Hashimoto's thyroiditis   . Allergic rhinitis   . GERD (gastroesophageal reflux disease)   . B12 deficiency   . History of transient ischemic attack (TIA)     Or possibly migraine as well as right amaurosis fugax and ataxia - Dr. Sandria Manly    Social History Ms. Kaylor reports that she quit smoking about 28 years ago. Her smoking use included Cigarettes. She has a 5 pack-year smoking history. She has never used smokeless tobacco. Ms. Standish  reports that she does not drink alcohol.  Review of Systems As outlined above, otherwise negative.  Physical Examination Filed Vitals:   03/12/11 1407  BP: 134/77  Pulse: 69  Resp:    Overweight woman in no acute distress.  HEENT: Conjunctiva and lids normal, oropharynx with moist mucosa.  Neck: Supple, no elevated JVP or carotid bruits.  Lungs: Clear to auscultation, nonlabored.  Cardiac: Regular rate and rhythm, soft systolic murmur at the base with preserved second heart sound, no pericardial rub or S3 gallop.  Abdomen: Soft, nontender, bowel sounds present.   Extremities: No pitting edema, distal pulses full.     Problem List and Plan

## 2011-03-12 NOTE — Assessment & Plan Note (Signed)
For now, continue observation and current dose of atenolol. Followup arranged. Recent cardiac structural and ischemic assessment was reassuring.

## 2011-09-17 ENCOUNTER — Encounter: Payer: Self-pay | Admitting: Adult Health

## 2011-09-17 ENCOUNTER — Ambulatory Visit (INDEPENDENT_AMBULATORY_CARE_PROVIDER_SITE_OTHER): Payer: Medicare Other | Admitting: Adult Health

## 2011-09-17 VITALS — BP 118/76 | HR 90 | Wt 190.0 lb

## 2011-09-17 DIAGNOSIS — I471 Supraventricular tachycardia, unspecified: Secondary | ICD-10-CM | POA: Diagnosis not present

## 2011-09-17 DIAGNOSIS — I10 Essential (primary) hypertension: Secondary | ICD-10-CM

## 2011-09-17 DIAGNOSIS — I472 Ventricular tachycardia: Secondary | ICD-10-CM | POA: Diagnosis not present

## 2011-09-17 DIAGNOSIS — I4729 Other ventricular tachycardia: Secondary | ICD-10-CM | POA: Diagnosis not present

## 2011-09-17 MED ORDER — ATENOLOL 25 MG PO TABS: 25.0000 mg | ORAL_TABLET | Freq: Every day | ORAL | Status: DC

## 2011-09-17 NOTE — Assessment & Plan Note (Signed)
The patient is having episodes of rapid heart rhythm and palpitations since decreasing her dose of atenolol to 12.5 mg at at bedtime. She did so because she felt was causing her to gain weight. I have asked her to return to the 25 mg at bedtime dose that was prescribed originally by Dr. Diona Browner. She is to follow with her primary care physician concerning her TSH levels and adjustment of medication as well. I have advised her not to adjust any of her medications unless she is seen by her primary care physician. She verbalizes understanding. She requests lab work to be completed as she is to followup with her PCP within the next couple of weeks. I have ordered a CBC, CMET and TSH. We will see her again in one year unless she becomes symptomatic.

## 2011-09-17 NOTE — Progress Notes (Signed)
HPI: [name] is a 66 year old female patient of Dr. Diona Browner we are seeing I 6 months followup. She is being treated by Dr. Diona Browner for PSVT and palpitations. He was recently seen in Dr. Diona Browner in February 2013 for an echocardiogram had been completed revealing LVEF is 65-70% with no major valvular abnormalities. Cardiolite stress test is low-risk without evidence of ischemia.   She has adjusted her medications on her own since last visit. She had been gaining weight until it was related to atenolol. She stopped it for a while and then reduce the dose to 12.5 mg at at bedtime from 25 mg at at bedtime. Patient has a history of thyroid disease ( Hashimoto's thyroiditis) has had a thyroidectomy, and is being followed by Dr. Gerda Diss for ongoing assessment of this. She is also manipulating her thyroid medications as well. She admits to having frequent palpitations especially when she has minimal exertion. She states they usually go away when she is at rest. She denies any chest pain or shortness of breath.  Allergies  Allergen Reactions  . Amoxicillin-Pot Clavulanate   . Aspirin   . Sulfonamide Derivatives     Current Outpatient Prescriptions  Medication Sig Dispense Refill  . acetaminophen (TYLENOL) 500 MG tablet Take 500 mg by mouth every 6 (six) hours as needed.      Marland Kitchen aspirin EC 81 MG tablet Take 81 mg by mouth daily.      Marland Kitchen atenolol (TENORMIN) 25 MG tablet Take 1 tablet (25 mg total) by mouth at bedtime.  90 tablet  3  . Biotin 1000 MCG tablet Take 1,000 mcg by mouth daily.      Marland Kitchen buPROPion (WELLBUTRIN XL) 300 MG 24 hr tablet Take 150 mg by mouth daily.       . Cholecalciferol (D3-1000) 1000 UNITS capsule Take 1,000 Units by mouth daily.      . clonazePAM (KLONOPIN) 1 MG tablet Take 0.5-1 mg by mouth 3 (three) times daily.      . cyanocobalamin (,VITAMIN B-12,) 1000 MCG/ML injection Inject 1,000 mcg into the muscle once.      . cyclobenzaprine (FLEXERIL) 10 MG tablet Take 10 mg by  mouth 3 (three) times daily as needed.      Marland Kitchen dexlansoprazole (DEXILANT) 60 MG capsule Take 60 mg by mouth daily.      . fluticasone (FLONASE) 50 MCG/ACT nasal spray Place 2 sprays into the nose daily.      . folic acid (FOLVITE) 1 MG tablet Take 1 mg by mouth daily.      . hyoscyamine (LEVSIN, ANASPAZ) 0.125 MG tablet Take 0.125 mg by mouth every 4 (four) hours as needed.      Marland Kitchen levothyroxine (SYNTHROID, LEVOTHROID) 125 MCG tablet Take 125 mcg by mouth daily.      Marland Kitchen losartan (COZAAR) 50 MG tablet Take 50 mg by mouth daily.      Marland Kitchen lovastatin (MEVACOR) 10 MG tablet Take 10 mg by mouth at bedtime.      . meloxicam (MOBIC) 15 MG tablet Take 15 mg by mouth daily as needed.      . metroNIDAZOLE (METROGEL) 0.75 % vaginal gel       . Probiotic Product (PROBIOTIC FORMULA PO) Take 1 capsule by mouth daily.      . promethazine (PHENERGAN) 25 MG tablet Take 25-50 mg by mouth every 6 (six) hours as needed.      . traMADol (ULTRAM) 50 MG tablet Take 50 mg by mouth every 6 (  six) hours as needed.      . venlafaxine (EFFEXOR-XR) 150 MG 24 hr capsule Take 150 mg by mouth daily.      . vitamin E 200 UNIT capsule Take 200 Units by mouth daily.      Marland Kitchen DISCONTD: atenolol (TENORMIN) 25 MG tablet Take 1 tablet (25 mg total) by mouth at bedtime.  30 tablet  6  . DISCONTD: atenolol (TENORMIN) 25 MG tablet Take 12.5 mg by mouth at bedtime.        Past Medical History  Diagnosis Date  . Essential hypertension, benign   . Hyperlipidemia   . Bipolar disorder   . Hashimoto's thyroiditis   . Allergic rhinitis   . GERD (gastroesophageal reflux disease)   . B12 deficiency   . History of transient ischemic attack (TIA)     Or possibly migraine as well as right amaurosis fugax and ataxia - Dr. Sandria Manly    Past Surgical History  Procedure Date  . Laparoscopic cholecystectomy 2010  . Thyroidectomy 2009    ZOX:WRUEAV of systems complete and found to be negative unless listed above  PHYSICAL EXAM BP 118/76  Pulse 90   Wt 190 lb (86.183 kg)  General: Well developed, well nourished, in no acute distress Head: Eyes PERRLA, No xanthomas.   Normal cephalic and atramatic  Lungs: Clear bilaterally to auscultation and percussion. Heart: HRRR S1 S2, without MRG.  Pulses are 2+ & equal.            No carotid bruit. No JVD.  No abdominal bruits. No femoral bruits. Abdomen: Bowel sounds are positive, abdomen soft and non-tender without masses or                  Hernia's noted. Msk:  Back normal, normal gait. Normal strength and tone for age. Extremities: No clubbing, cyanosis or edema.  DP +1 Neuro: Alert and oriented X 3. Psych:  Good affect, responds appropriately  :  ASSESSMENT AND PLAN

## 2011-09-17 NOTE — Addendum Note (Signed)
Addended by: Reather Laurence A on: 09/17/2011 04:45 PM   Modules accepted: Orders

## 2011-09-17 NOTE — Patient Instructions (Addendum)
Your physician recommends that you have lab work today: cbc,cmp,estrogen, tsh  Your physician has recommended you make the following change in your medication:  Start Atenolol 25mg  1 tablet daily  Your physician wants you to follow-up in: 12 months  You will receive a reminder letter in the mail two months in advance. If you don't receive a letter, please call our office to schedule the follow-up appointment.

## 2011-09-17 NOTE — Assessment & Plan Note (Signed)
Blood pressure is well-controlled currently at 118/76. Her pulse is in the 90s. She will continue the losartan 50 mg daily. She is to continue all medications as directed without changing doses on her without informing medical  provider.

## 2011-09-18 LAB — TSH: TSH: 1.522 u[IU]/mL (ref 0.350–4.500)

## 2011-09-18 LAB — COMPREHENSIVE METABOLIC PANEL
AST: 16 U/L (ref 0–37)
Alkaline Phosphatase: 74 U/L (ref 39–117)
BUN: 7 mg/dL (ref 6–23)
Creat: 0.65 mg/dL (ref 0.50–1.10)
Glucose, Bld: 73 mg/dL (ref 70–99)
Total Bilirubin: 0.3 mg/dL (ref 0.3–1.2)

## 2011-09-18 LAB — CBC
HCT: 37.2 % (ref 36.0–46.0)
Hemoglobin: 12.7 g/dL (ref 12.0–15.0)
MCH: 30.4 pg (ref 26.0–34.0)
MCV: 89 fL (ref 78.0–100.0)
RBC: 4.18 MIL/uL (ref 3.87–5.11)

## 2011-09-20 ENCOUNTER — Telehealth: Payer: Self-pay | Admitting: Adult Health

## 2011-09-20 NOTE — Telephone Encounter (Signed)
Pt calling for lab results.

## 2011-09-21 DIAGNOSIS — N899 Noninflammatory disorder of vagina, unspecified: Secondary | ICD-10-CM | POA: Diagnosis not present

## 2011-09-21 DIAGNOSIS — N952 Postmenopausal atrophic vaginitis: Secondary | ICD-10-CM | POA: Diagnosis not present

## 2011-09-21 NOTE — Telephone Encounter (Signed)
Called to patient and copy mad for her

## 2011-09-23 DIAGNOSIS — K5901 Slow transit constipation: Secondary | ICD-10-CM | POA: Diagnosis not present

## 2011-09-23 DIAGNOSIS — R1011 Right upper quadrant pain: Secondary | ICD-10-CM | POA: Diagnosis not present

## 2011-10-01 ENCOUNTER — Other Ambulatory Visit (HOSPITAL_COMMUNITY): Payer: Self-pay | Admitting: Sports Medicine

## 2011-10-01 DIAGNOSIS — M25569 Pain in unspecified knee: Secondary | ICD-10-CM | POA: Diagnosis not present

## 2011-10-01 DIAGNOSIS — M25562 Pain in left knee: Secondary | ICD-10-CM

## 2011-10-05 ENCOUNTER — Ambulatory Visit (HOSPITAL_COMMUNITY)
Admission: RE | Admit: 2011-10-05 | Discharge: 2011-10-05 | Disposition: A | Discharge status: Home / Self Care | Payer: Medicare Other | Source: Ambulatory Visit | Attending: Sports Medicine | Admitting: Sports Medicine

## 2011-10-05 DIAGNOSIS — M76899 Other specified enthesopathies of unspecified lower limb, excluding foot: Secondary | ICD-10-CM | POA: Insufficient documentation

## 2011-10-05 DIAGNOSIS — M25569 Pain in unspecified knee: Secondary | ICD-10-CM | POA: Insufficient documentation

## 2011-10-05 DIAGNOSIS — M898X9 Other specified disorders of bone, unspecified site: Secondary | ICD-10-CM | POA: Diagnosis not present

## 2011-10-05 DIAGNOSIS — M23329 Other meniscus derangements, posterior horn of medial meniscus, unspecified knee: Secondary | ICD-10-CM | POA: Insufficient documentation

## 2011-10-05 DIAGNOSIS — M25562 Pain in left knee: Secondary | ICD-10-CM

## 2011-10-05 DIAGNOSIS — Z043 Encounter for examination and observation following other accident: Secondary | ICD-10-CM | POA: Diagnosis not present

## 2011-10-11 DIAGNOSIS — IMO0002 Reserved for concepts with insufficient information to code with codable children: Secondary | ICD-10-CM | POA: Diagnosis not present

## 2011-10-11 DIAGNOSIS — M25569 Pain in unspecified knee: Secondary | ICD-10-CM | POA: Diagnosis not present

## 2011-10-12 DIAGNOSIS — M159 Polyosteoarthritis, unspecified: Secondary | ICD-10-CM | POA: Diagnosis not present

## 2011-10-25 DIAGNOSIS — F441 Dissociative fugue: Secondary | ICD-10-CM | POA: Diagnosis not present

## 2011-11-02 ENCOUNTER — Telehealth: Payer: Self-pay | Admitting: Cardiology

## 2011-11-02 DIAGNOSIS — R209 Unspecified disturbances of skin sensation: Secondary | ICD-10-CM | POA: Diagnosis not present

## 2011-11-02 DIAGNOSIS — G43909 Migraine, unspecified, not intractable, without status migrainosus: Secondary | ICD-10-CM | POA: Diagnosis not present

## 2011-11-02 NOTE — Telephone Encounter (Signed)
PT HAS BEEN TAKING ATENOLOL SYMPTOMS OF FATIGUE AND SWEATING. SHE STOPPED TAKING IT ON Friday AND DID NOT TAKE ANY OVER THE WEEKEND TO SEE IF THIS WAS BECAUSE OF THE MEDICATIONS. SHE STATES THAT SHE FEEL SO MUCH BETTER " BACK TO HER SELF".  SHE ALSO HAS BEEN AWAKEN WITH A SMOTHERING FEELING.  SHE WOULD LIKE TO KNOW IF SHE COULD BE ON A BLOOD PRESSURE MEDICATION THAT INCLUDES SOMETHING FOR HER FAST HEART RATE.

## 2011-11-03 NOTE — Telephone Encounter (Signed)
Last time I saw her earlier in the year she was tolerating atenolol, apparently had cut it back when she saw Samara Deist more recently. If she is not tolerating atenolol, we could try Cardizem CD 120 mg daily.

## 2011-11-03 NOTE — Telephone Encounter (Signed)
Please advise 

## 2011-11-04 ENCOUNTER — Other Ambulatory Visit: Payer: Self-pay | Admitting: *Deleted

## 2011-11-04 MED ORDER — DILTIAZEM HCL ER BEADS 120 MG PO CP24: 120.0000 mg | ORAL_CAPSULE | Freq: Every day | ORAL | Status: DC

## 2011-11-04 NOTE — Telephone Encounter (Signed)
Patient notified of recommendations.  Atenolol intolerance listed in patient's chart.  States she is willing to try Cardizem.

## 2011-11-08 DIAGNOSIS — Z23 Encounter for immunization: Secondary | ICD-10-CM | POA: Diagnosis not present

## 2011-12-02 ENCOUNTER — Telehealth: Payer: Self-pay | Admitting: Cardiology

## 2011-12-02 NOTE — Telephone Encounter (Signed)
Please advise 

## 2011-12-02 NOTE — Telephone Encounter (Signed)
States that she is having palpitations, associated with shortness of breath.  States that it happens with activity and at rest.  Does not happen every day, however has had several episodes over the last 2 weeks.

## 2011-12-02 NOTE — Telephone Encounter (Signed)
Sounds like she is having more difficulty with palpitations, rather than not tolerating the Cardizem CD. We may actually need to increase the dose. Let's try to go up to 180 mg daily.

## 2011-12-02 NOTE — Telephone Encounter (Signed)
PT STATES SHE WAS SWITCHED FORM ATENOLOL TO TIAZAC AND HAS BEEN ON NEW MED FOR A MONTH. SHE IS STILL EXPERIENCING THE SAME SYMPTOMS. SHE IS WANTING TO KNOW WHEN SHE SHOULD EXPECT THE MEDICATION TO START WORKING.

## 2011-12-02 NOTE — Telephone Encounter (Signed)
Please clarify. What symptoms is she actually experiencing? She was reporting difficulty with atenolol including fatigue and sweating. If she is experiencing the same symptoms reported with intolerance of atenolol, she might consider stopping Cardizem CD to see if she feels better. We might at that point keep her off any specific medicines for palpitations and possible PSVT, unless those symptoms worsen.

## 2011-12-03 ENCOUNTER — Other Ambulatory Visit: Payer: Self-pay | Admitting: *Deleted

## 2011-12-03 MED ORDER — DILTIAZEM HCL ER COATED BEADS 180 MG PO CP24: 180.0000 mg | ORAL_CAPSULE | Freq: Every day | ORAL | Status: DC

## 2011-12-03 NOTE — Telephone Encounter (Signed)
Patient made aware of changes.  Verbalized understanding and was advised to contact us if she did not have improvement in symptoms.

## 2011-12-10 ENCOUNTER — Telehealth: Payer: Self-pay | Admitting: *Deleted

## 2011-12-10 DIAGNOSIS — M1710 Unilateral primary osteoarthritis, unspecified knee: Secondary | ICD-10-CM | POA: Diagnosis not present

## 2011-12-10 DIAGNOSIS — IMO0002 Reserved for concepts with insufficient information to code with codable children: Secondary | ICD-10-CM | POA: Diagnosis not present

## 2011-12-10 NOTE — Telephone Encounter (Signed)
Consider giving it more time to see if she adjusts to the higher dose. I'm glad that the palpitations are better. If she cannot tolerate the medication, we can consider another choice, but I hope that things will continue to improve.

## 2011-12-10 NOTE — Telephone Encounter (Signed)
Recommendations called to patient

## 2011-12-10 NOTE — Telephone Encounter (Signed)
Patient states that her palps have improved, however she is now experiencing moderate SOB with the increase in her Cardizem to 180.  Please advise.

## 2011-12-15 DIAGNOSIS — L719 Rosacea, unspecified: Secondary | ICD-10-CM | POA: Diagnosis not present

## 2011-12-15 DIAGNOSIS — L650 Telogen effluvium: Secondary | ICD-10-CM | POA: Diagnosis not present

## 2011-12-30 DIAGNOSIS — I10 Essential (primary) hypertension: Secondary | ICD-10-CM | POA: Diagnosis not present

## 2011-12-30 DIAGNOSIS — R5381 Other malaise: Secondary | ICD-10-CM | POA: Diagnosis not present

## 2011-12-30 DIAGNOSIS — R5383 Other fatigue: Secondary | ICD-10-CM | POA: Diagnosis not present

## 2011-12-30 DIAGNOSIS — E039 Hypothyroidism, unspecified: Secondary | ICD-10-CM | POA: Diagnosis not present

## 2011-12-30 DIAGNOSIS — K219 Gastro-esophageal reflux disease without esophagitis: Secondary | ICD-10-CM | POA: Diagnosis not present

## 2012-01-05 DIAGNOSIS — Z1231 Encounter for screening mammogram for malignant neoplasm of breast: Secondary | ICD-10-CM | POA: Diagnosis not present

## 2012-01-07 ENCOUNTER — Telehealth: Payer: Self-pay | Admitting: Cardiology

## 2012-01-07 NOTE — Telephone Encounter (Signed)
Advised patient to contact her PCP regarding this issue.  Verbalized understanding.

## 2012-01-07 NOTE — Telephone Encounter (Signed)
Patient has question about her platelets. Seems to be increasing.  Is her blood getting too think and should she change aspirin

## 2012-02-01 DIAGNOSIS — F333 Major depressive disorder, recurrent, severe with psychotic symptoms: Secondary | ICD-10-CM | POA: Diagnosis not present

## 2012-02-24 DIAGNOSIS — M25569 Pain in unspecified knee: Secondary | ICD-10-CM | POA: Diagnosis not present

## 2012-02-24 DIAGNOSIS — F441 Dissociative fugue: Secondary | ICD-10-CM | POA: Diagnosis not present

## 2012-04-05 DIAGNOSIS — R110 Nausea: Secondary | ICD-10-CM | POA: Diagnosis not present

## 2012-04-05 DIAGNOSIS — R509 Fever, unspecified: Secondary | ICD-10-CM | POA: Diagnosis not present

## 2012-04-05 DIAGNOSIS — K589 Irritable bowel syndrome without diarrhea: Secondary | ICD-10-CM | POA: Diagnosis not present

## 2012-04-05 DIAGNOSIS — R109 Unspecified abdominal pain: Secondary | ICD-10-CM | POA: Diagnosis not present

## 2012-04-05 DIAGNOSIS — R112 Nausea with vomiting, unspecified: Secondary | ICD-10-CM | POA: Diagnosis not present

## 2012-04-05 DIAGNOSIS — K219 Gastro-esophageal reflux disease without esophagitis: Secondary | ICD-10-CM | POA: Diagnosis not present

## 2012-04-07 DIAGNOSIS — K219 Gastro-esophageal reflux disease without esophagitis: Secondary | ICD-10-CM | POA: Diagnosis not present

## 2012-04-07 DIAGNOSIS — R109 Unspecified abdominal pain: Secondary | ICD-10-CM | POA: Diagnosis not present

## 2012-04-07 DIAGNOSIS — K5901 Slow transit constipation: Secondary | ICD-10-CM | POA: Diagnosis not present

## 2012-05-02 ENCOUNTER — Other Ambulatory Visit: Payer: Self-pay | Admitting: Gastroenterology

## 2012-05-02 DIAGNOSIS — R109 Unspecified abdominal pain: Secondary | ICD-10-CM | POA: Diagnosis not present

## 2012-05-02 DIAGNOSIS — R634 Abnormal weight loss: Secondary | ICD-10-CM | POA: Diagnosis not present

## 2012-05-02 DIAGNOSIS — K219 Gastro-esophageal reflux disease without esophagitis: Secondary | ICD-10-CM | POA: Diagnosis not present

## 2012-05-04 DIAGNOSIS — H524 Presbyopia: Secondary | ICD-10-CM | POA: Diagnosis not present

## 2012-05-04 DIAGNOSIS — H5200 Hypermetropia, unspecified eye: Secondary | ICD-10-CM | POA: Diagnosis not present

## 2012-05-04 DIAGNOSIS — H52229 Regular astigmatism, unspecified eye: Secondary | ICD-10-CM | POA: Diagnosis not present

## 2012-05-04 DIAGNOSIS — H43399 Other vitreous opacities, unspecified eye: Secondary | ICD-10-CM | POA: Diagnosis not present

## 2012-05-05 ENCOUNTER — Ambulatory Visit
Admission: RE | Admit: 2012-05-05 | Discharge: 2012-05-05 | Disposition: A | Discharge status: Home / Self Care | Payer: Medicare Other | Source: Ambulatory Visit | Attending: Gastroenterology | Admitting: Gastroenterology

## 2012-05-05 DIAGNOSIS — K5730 Diverticulosis of large intestine without perforation or abscess without bleeding: Secondary | ICD-10-CM | POA: Diagnosis not present

## 2012-05-05 MED ORDER — IOHEXOL 300 MG/ML  SOLN
100.0000 mL | Freq: Once | INTRAMUSCULAR | Status: AC | PRN
  Administered 2012-05-05: 100 mL via INTRAVENOUS

## 2012-05-15 DIAGNOSIS — R42 Dizziness and giddiness: Secondary | ICD-10-CM | POA: Diagnosis not present

## 2012-05-15 DIAGNOSIS — R945 Abnormal results of liver function studies: Secondary | ICD-10-CM | POA: Diagnosis not present

## 2012-05-15 DIAGNOSIS — E782 Mixed hyperlipidemia: Secondary | ICD-10-CM | POA: Diagnosis not present

## 2012-05-15 DIAGNOSIS — E039 Hypothyroidism, unspecified: Secondary | ICD-10-CM | POA: Diagnosis not present

## 2012-05-16 DIAGNOSIS — R42 Dizziness and giddiness: Secondary | ICD-10-CM | POA: Diagnosis not present

## 2012-05-16 DIAGNOSIS — E785 Hyperlipidemia, unspecified: Secondary | ICD-10-CM | POA: Diagnosis not present

## 2012-05-16 DIAGNOSIS — E039 Hypothyroidism, unspecified: Secondary | ICD-10-CM | POA: Diagnosis not present

## 2012-05-24 DIAGNOSIS — E039 Hypothyroidism, unspecified: Secondary | ICD-10-CM | POA: Diagnosis not present

## 2012-05-30 DIAGNOSIS — F333 Major depressive disorder, recurrent, severe with psychotic symptoms: Secondary | ICD-10-CM | POA: Diagnosis not present

## 2012-06-07 DIAGNOSIS — R109 Unspecified abdominal pain: Secondary | ICD-10-CM | POA: Diagnosis not present

## 2012-06-14 ENCOUNTER — Other Ambulatory Visit: Payer: Self-pay | Admitting: Cardiology

## 2012-06-20 ENCOUNTER — Telehealth: Payer: Self-pay | Admitting: Neurology

## 2012-06-28 DIAGNOSIS — Z01419 Encounter for gynecological examination (general) (routine) without abnormal findings: Secondary | ICD-10-CM | POA: Diagnosis not present

## 2012-06-28 DIAGNOSIS — Z124 Encounter for screening for malignant neoplasm of cervix: Secondary | ICD-10-CM | POA: Diagnosis not present

## 2012-07-04 ENCOUNTER — Encounter: Payer: Self-pay | Admitting: Neurology

## 2012-07-19 DIAGNOSIS — H43819 Vitreous degeneration, unspecified eye: Secondary | ICD-10-CM | POA: Diagnosis not present

## 2012-08-15 DIAGNOSIS — E039 Hypothyroidism, unspecified: Secondary | ICD-10-CM | POA: Diagnosis not present

## 2012-08-15 DIAGNOSIS — D649 Anemia, unspecified: Secondary | ICD-10-CM | POA: Diagnosis not present

## 2012-08-15 DIAGNOSIS — R5381 Other malaise: Secondary | ICD-10-CM | POA: Diagnosis not present

## 2012-08-15 DIAGNOSIS — R5383 Other fatigue: Secondary | ICD-10-CM | POA: Diagnosis not present

## 2012-08-15 DIAGNOSIS — R109 Unspecified abdominal pain: Secondary | ICD-10-CM | POA: Diagnosis not present

## 2012-08-15 DIAGNOSIS — I69921 Dysphasia following unspecified cerebrovascular disease: Secondary | ICD-10-CM | POA: Diagnosis not present

## 2012-08-21 ENCOUNTER — Telehealth: Payer: Self-pay | Admitting: Neurology

## 2012-08-22 ENCOUNTER — Encounter: Payer: Self-pay | Admitting: Neurology

## 2012-08-23 DIAGNOSIS — Z1211 Encounter for screening for malignant neoplasm of colon: Secondary | ICD-10-CM | POA: Diagnosis not present

## 2012-09-06 ENCOUNTER — Encounter: Payer: Self-pay | Admitting: Cardiology

## 2012-09-06 ENCOUNTER — Ambulatory Visit (INDEPENDENT_AMBULATORY_CARE_PROVIDER_SITE_OTHER): Payer: Medicare Other | Admitting: Cardiology

## 2012-09-06 VITALS — BP 130/76 | HR 74 | Ht 61.5 in | Wt 178.0 lb

## 2012-09-06 DIAGNOSIS — I471 Supraventricular tachycardia: Secondary | ICD-10-CM | POA: Diagnosis not present

## 2012-09-06 NOTE — Progress Notes (Signed)
Clinical Summary [name] is a 67 y.o.female last seen in August 2013 by Ms. Lawrence NP. She reports occasional palpitations. Has had some chest pain symptoms since I last saw her, nothing progressive or reproducible. She had a reassuring structural ischemic cardiac workup within the last 2 years.  Recent labwork reviewed per Dr. Margo Aye, TSH was low. ECG today shows sinus rhythm with RBBB, no major change.   Allergies  Allergen Reactions  . Amoxicillin-Pot Clavulanate   . Aspirin   . Atenolol   . Sulfonamide Derivatives     Current Outpatient Prescriptions  Medication Sig Dispense Refill  . acetaminophen (TYLENOL) 500 MG tablet Take 500 mg by mouth every 6 (six) hours as needed.      Marland Kitchen aspirin EC 81 MG tablet Take 81 mg by mouth daily.      . Biotin 1000 MCG tablet Take 1,000 mcg by mouth daily.      Marland Kitchen buPROPion (WELLBUTRIN XL) 300 MG 24 hr tablet Take 150 mg by mouth daily.       . Cholecalciferol (D3-1000) 1000 UNITS capsule Take 1,000 Units by mouth daily.      . clonazePAM (KLONOPIN) 1 MG tablet Take 0.5-1 mg by mouth 3 (three) times daily.      . cyanocobalamin (,VITAMIN B-12,) 1000 MCG/ML injection Inject 1,000 mcg into the muscle once.      . cyclobenzaprine (FLEXERIL) 10 MG tablet Take 10 mg by mouth 3 (three) times daily as needed.      Marland Kitchen dexlansoprazole (DEXILANT) 60 MG capsule Take 60 mg by mouth daily.      Marland Kitchen diltiazem (CARDIZEM CD) 180 MG 24 hr capsule TAKE (1) CAPSULE BY MOUTH ONCE DAILY.  30 capsule  2  . folic acid (FOLVITE) 1 MG tablet Take 1 mg by mouth daily.      . hyoscyamine (LEVSIN, ANASPAZ) 0.125 MG tablet Take 0.125 mg by mouth every 4 (four) hours as needed.      Marland Kitchen levothyroxine (SYNTHROID, LEVOTHROID) 125 MCG tablet Take 125 mcg by mouth daily.      Marland Kitchen losartan (COZAAR) 50 MG tablet Take 50 mg by mouth daily.      . metroNIDAZOLE (METROGEL) 0.75 % vaginal gel       . Probiotic Product (PROBIOTIC FORMULA PO) Take 1 capsule by mouth daily.      .  promethazine (PHENERGAN) 25 MG tablet Take 25-50 mg by mouth every 6 (six) hours as needed.      . venlafaxine (EFFEXOR-XR) 150 MG 24 hr capsule Take 150 mg by mouth daily.      . vitamin E 200 UNIT capsule Take 200 Units by mouth daily.       No current facility-administered medications for this visit.    Past Medical History  Diagnosis Date  . Essential hypertension, benign   . Hyperlipidemia   . Bipolar disorder   . Hashimoto's thyroiditis   . Allergic rhinitis   . GERD (gastroesophageal reflux disease)   . B12 deficiency   . History of transient ischemic attack (TIA)     Or possibly migraine as well as right amaurosis fugax and ataxia - Dr. Sandria Manly  . PSVT (paroxysmal supraventricular tachycardia)     Social History [name] reports that she quit smoking about 29 years ago. Her smoking use included Cigarettes. She has a 5 pack-year smoking history. She has never used smokeless tobacco. [name] reports that she does not drink alcohol.  Review of Systems Habits occasional  chills, feeling of abdominal fullness, belching. She was short of breath with these symptoms. Otherwise negative.  Physical Examination Filed Vitals:   09/06/12 1441  BP: 130/76  Pulse: 74   Filed Weights   09/06/12 1441  Weight: 178 lb (80.74 kg)    Appears comfortable at rest.  HEENT: Conjunctiva and lids normal, oropharynx with moist mucosa.  Neck: Supple, no elevated JVP or carotid bruits.  Lungs: Clear to auscultation, nonlabored.  Cardiac: Regular rate and rhythm, soft systolic murmur at the base with preserved second heart sound, no pericardial rub or S3 gallop.  Abdomen: Soft, nontender, bowel sounds present.  Extremities: No pitting edema, distal pulses full.    Problem List and Plan   PSVT (paroxysmal supraventricular tachycardia) Tolerating Cardizem CD better than previous beta blocker. ECG reviewed and stable. No changes made today. Incidentally, might consider asking her  primary care provider about reducing Synthroid replacement with low TSH. May help to prevent palpitations and recurrent arrhythmia.    Jonelle Sidle, M.D., F.A.C.C.

## 2012-09-06 NOTE — Patient Instructions (Addendum)
Your physician recommends that you schedule a follow-up appointment in: ONE YEAR 

## 2012-09-06 NOTE — Assessment & Plan Note (Signed)
Tolerating Cardizem CD better than previous beta blocker. ECG reviewed and stable. No changes made today. Incidentally, might consider asking her primary care provider about reducing Synthroid replacement with low TSH. May help to prevent palpitations and recurrent arrhythmia.

## 2012-09-14 ENCOUNTER — Encounter: Payer: Self-pay | Admitting: Neurology

## 2012-10-10 ENCOUNTER — Encounter: Payer: Self-pay | Admitting: Neurology

## 2012-10-13 ENCOUNTER — Other Ambulatory Visit: Payer: Self-pay | Admitting: Cardiology

## 2012-11-07 DIAGNOSIS — D518 Other vitamin B12 deficiency anemias: Secondary | ICD-10-CM | POA: Diagnosis not present

## 2012-11-07 DIAGNOSIS — Z23 Encounter for immunization: Secondary | ICD-10-CM | POA: Diagnosis not present

## 2012-11-07 DIAGNOSIS — E039 Hypothyroidism, unspecified: Secondary | ICD-10-CM | POA: Diagnosis not present

## 2012-11-07 DIAGNOSIS — E785 Hyperlipidemia, unspecified: Secondary | ICD-10-CM | POA: Diagnosis not present

## 2012-11-07 DIAGNOSIS — R5381 Other malaise: Secondary | ICD-10-CM | POA: Diagnosis not present

## 2012-11-07 DIAGNOSIS — I10 Essential (primary) hypertension: Secondary | ICD-10-CM | POA: Diagnosis not present

## 2012-11-07 DIAGNOSIS — E559 Vitamin D deficiency, unspecified: Secondary | ICD-10-CM | POA: Diagnosis not present

## 2012-11-14 ENCOUNTER — Ambulatory Visit (INDEPENDENT_AMBULATORY_CARE_PROVIDER_SITE_OTHER): Payer: Medicare Other | Admitting: Neurology

## 2012-11-14 ENCOUNTER — Encounter: Payer: Self-pay | Admitting: Neurology

## 2012-11-14 VITALS — BP 142/65 | HR 71 | Temp 98.2°F | Ht 63.0 in | Wt 179.0 lb

## 2012-11-14 DIAGNOSIS — F3289 Other specified depressive episodes: Secondary | ICD-10-CM | POA: Diagnosis not present

## 2012-11-14 DIAGNOSIS — E079 Disorder of thyroid, unspecified: Secondary | ICD-10-CM | POA: Diagnosis not present

## 2012-11-14 DIAGNOSIS — R413 Other amnesia: Secondary | ICD-10-CM

## 2012-11-14 DIAGNOSIS — F329 Major depressive disorder, single episode, unspecified: Secondary | ICD-10-CM

## 2012-11-14 DIAGNOSIS — F411 Generalized anxiety disorder: Secondary | ICD-10-CM

## 2012-11-14 DIAGNOSIS — R269 Unspecified abnormalities of gait and mobility: Secondary | ICD-10-CM | POA: Diagnosis not present

## 2012-11-14 DIAGNOSIS — F32A Depression, unspecified: Secondary | ICD-10-CM

## 2012-11-14 NOTE — Patient Instructions (Addendum)
I think overall you are doing fairly well but I do want to suggest a few things today:  Remember to drink plenty of fluid, eat healthy meals and do not skip any meals. Try to eat protein with a every meal and eat a healthy snack such as fruit or nuts in between meals. Try to keep a regular sleep-wake schedule and try to exercise daily, particularly in the form of walking, 20-30 minutes a day, if you can.   Engage in social activities in your community and with your family and try to keep up with current events by reading the newspaper or watching the news.   As far as your medications are concerned, I would like to suggest no changes.    As far as diagnostic testing: no new test. We will consider and sleep study. Please ask family and friends or your significant other or bed partner if you snore and if so, how loud it is, and if you have breathing related issues in your sleep, such as: snorting sounds, choking sounds, pauses in your breathing or shallow breathing events. These may be symptoms of obstructive sleep apnea (OSA).   I would like to see you back in 6 months, sooner if we need to. Please call us with any interim questions, concerns, problems, updates or refill requests.  Brett Canales is my clinical assistant and will answer any of your questions and relay your messages to me and also relay most of my messages to you.  Our phone number is [phone]. We also have an after hours call service for urgent matters and there is a physician on-call for urgent questions. For any emergencies you know to call 911 or go to the nearest emergency room.

## 2012-11-14 NOTE — Progress Notes (Signed)
Subjective:    Patient ID: [name] is a 67 y.o. female.  HPI  Interim history:   [name] is a very pleasant 67 year old right-handed woman who presents for followup consultation of her episodic confusion. She is unaccompanied today. This is her first visit after Dr. Imagene Gurney retirement and she was last seen by Dr. Avie Echevaria on 02/24/2012 at which time he felt her episodes may be related to her depression. She has an underlying medical history of thyroid disease (Hashimoto's, s/p thyroidectomy), arthritis in her neck and knees, IBS, hypertension, depression, anxiety, paroxysmal supraventricular tachycardia, vitamin B12 deficiency and confusional episodes. She is currently on losartan, Cardizem, cyclobenzaprine, tramadol, Mobic, lovastatin, Flonase, TUMS, Aleve, folate acid, Effexor XR, Wellbutrin, Klonopin, Phenergan, vitamin D, vitamin B12 injections, Synthroid, baby aspirin, Dexilant. I reviewed her recent labs from 08/15/2012 which indicated a low TSH at 0.181, a normal free T4, normal CMP, and a normal CBC with the exception of a slightly elevated platelet count at 432.  She had a reduction in her synthroid to 112 mcg and had her TSH retested recently. She feels, her memory is worse and her she has problems with attention and focusing. She has tics and suffers from fatigue. She has been more anxious and is on Klonopin per Dr. Betti Cruz. She also has depression and is on Effexor and Wellbutrin for this. She has trouble with sleep maintenance.  She has been noted to snore and wakes up non-refreshed. She endorses daytime somnolence. She has never had a sleep study. She has had her eyes checked. She has visual migraine auras without headaches.   I reviewed Dr. Imagene Gurney prior notes and the patient's records and below is a summary of that review:  She has had gait and balance problems. MRI brain and C-spine from July 2005 showed normal brain and internal auditory canals, cervical spondylosis and  degenerative disc disease without frank herniation or cord compression or foraminal stenoses. VEP and BAER were normal in July 2005. She had right eye vision loss for 1 minute in February 2010. She has no history of migraine headaches. Carotid Doppler studies were normal in March 2010. She was started on aspirin on the presumed diagnosis of TIAs. MRI brain from March 2011 showed small vessel ischemic changes. She has a history of fainting spells since the 70s. She underwent Holter monitoring and was found to have paroxysmal supraventricular tachycardia and was placed on a beta blocker. EEG was normal in October 2013.  Her Past Medical History Is Significant For: Past Medical History  Diagnosis Date  . Essential hypertension, benign   . Hyperlipidemia   . Bipolar disorder   . Hashimoto's thyroiditis   . Allergic rhinitis   . GERD (gastroesophageal reflux disease)   . B12 deficiency   . History of transient ischemic attack (TIA)     Or possibly migraine as well as right amaurosis fugax and ataxia - Dr. Sandria Manly  . PSVT (paroxysmal supraventricular tachycardia)     Her Past Surgical History Is Significant For: Past Surgical History  Procedure Laterality Date  . Laparoscopic cholecystectomy  2010  . Thyroidectomy  2009    Her Family History Is Significant For: Family History  Problem Relation Age of Onset  . Depression Father   . Stroke Mother     Her Social History Is Significant For: History   Social History  . Marital Status: Married    Spouse Name: N/A    Number of Children: 2  . Years of Education:  N/A   Occupational History  . homemaker    Social History Main Topics  . Smoking status: Former Smoker -- 0.50 packs/day for 10 years    Types: Cigarettes    Quit date: 01/26/1983  . Smokeless tobacco: Never Used     Comment: quit 30 years ago  . Alcohol Use: No  . Drug Use: No  . Sexual Activity: None   Other Topics Concern  . None   Social History Narrative  . None     Her Allergies Are:  Allergies  Allergen Reactions  . Amoxicillin-Pot Clavulanate   . Atenolol   . Sulfonamide Derivatives   :   Her Current Medications Are:  Outpatient Encounter Prescriptions as of 11/14/2012  Medication Sig Dispense Refill  . Acetaminophen (APAP 500 PO) Take 1 tablet by mouth every 6 (six) hours as needed.      Marland Kitchen aspirin 81 MG tablet Take 81 mg by mouth daily.      . Biotin (PA BIOTIN) 1000 MCG tablet Take 1,000 mcg by mouth daily.      Marland Kitchen buPROPion (WELLBUTRIN XL) 150 MG 24 hr tablet Take 150 mg by mouth daily.      . Cholecalciferol 1000 UNITS tablet Take 1,000 Units by mouth daily.      . clonazePAM (KLONOPIN) 1 MG tablet Take 1 mg by mouth 3 (three) times daily as needed for anxiety.      . cyanocobalamin (,VITAMIN B-12,) 1000 MCG/ML injection Inject 1,000 mcg into the muscle once.      . cyclobenzaprine (FLEXERIL) 10 MG tablet Take 10 mg by mouth 3 (three) times daily as needed for muscle spasms.      Marland Kitchen dexlansoprazole (DEXILANT) 60 MG capsule Take 60 mg by mouth daily.      Marland Kitchen diltiazem (CARDIZEM CD) 180 MG 24 hr capsule Take 180 mg by mouth daily.      Marland Kitchen ESTRACE VAGINAL 0.1 MG/GM vaginal cream       . folic acid (FOLVITE) 1 MG tablet Take 1 mg by mouth daily.      . hyoscyamine (LEVSIN, ANASPAZ) 0.125 MG tablet Take 0.125 mg by mouth every 4 (four) hours as needed for cramping.      Marland Kitchen levothyroxine (SYNTHROID, LEVOTHROID) 112 MCG tablet Take 112 mcg by mouth daily before breakfast.      . losartan (COZAAR) 50 MG tablet Take 50 mg by mouth daily.      . metroNIDAZOLE (METROGEL) 0.75 % gel Apply 1 application topically 2 (two) times daily.      . Probiotic Product (PROBIOTIC DAILY) CAPS Take 1 capsule by mouth daily.      . promethazine (PHENERGAN) 25 MG tablet Take 25 mg by mouth every 6 (six) hours as needed for nausea.      Marland Kitchen venlafaxine XR (EFFEXOR-XR) 150 MG 24 hr capsule Take 150 mg by mouth daily.      . vitamin E 200 UNIT capsule Take 200 Units by  mouth daily.      . [DISCONTINUED] acetaminophen (TYLENOL) 500 MG tablet Take 500 mg by mouth every 6 (six) hours as needed.      . [DISCONTINUED] aspirin EC 81 MG tablet Take 81 mg by mouth daily.      . [DISCONTINUED] Biotin 1000 MCG tablet Take 1,000 mcg by mouth daily.      . [DISCONTINUED] buPROPion (WELLBUTRIN XL) 300 MG 24 hr tablet Take 150 mg by mouth daily.       . [DISCONTINUED]  Cholecalciferol (D3-1000) 1000 UNITS capsule Take 1,000 Units by mouth daily.      . [DISCONTINUED] clonazePAM (KLONOPIN) 1 MG tablet Take 0.5-1 mg by mouth 3 (three) times daily.      . [DISCONTINUED] cyanocobalamin (,VITAMIN B-12,) 1000 MCG/ML injection Inject 1,000 mcg into the muscle once.      . [DISCONTINUED] cyclobenzaprine (FLEXERIL) 10 MG tablet Take 10 mg by mouth 3 (three) times daily as needed.      . [DISCONTINUED] dexlansoprazole (DEXILANT) 60 MG capsule Take 60 mg by mouth daily.      . [DISCONTINUED] diltiazem (CARDIZEM CD) 180 MG 24 hr capsule TAKE (1) CAPSULE BY MOUTH ONCE DAILY.  30 capsule  10  . [DISCONTINUED] folic acid (FOLVITE) 1 MG tablet Take 1 mg by mouth daily.      . [DISCONTINUED] hyoscyamine (LEVSIN, ANASPAZ) 0.125 MG tablet Take 0.125 mg by mouth every 4 (four) hours as needed.      . [DISCONTINUED] levothyroxine (SYNTHROID, LEVOTHROID) 125 MCG tablet Take 112 mcg by mouth daily.       . [DISCONTINUED] losartan (COZAAR) 50 MG tablet Take 50 mg by mouth daily.      . [DISCONTINUED] metroNIDAZOLE (METROGEL) 0.75 % vaginal gel       . [DISCONTINUED] Probiotic Product (PROBIOTIC FORMULA PO) Take 1 capsule by mouth daily.      . [DISCONTINUED] promethazine (PHENERGAN) 25 MG tablet Take 25-50 mg by mouth every 6 (six) hours as needed.      . [DISCONTINUED] venlafaxine (EFFEXOR-XR) 150 MG 24 hr capsule Take 150 mg by mouth daily.      . [DISCONTINUED] vitamin E 200 UNIT capsule Take 200 Units by mouth daily.       No facility-administered encounter medications on file as of 11/14/2012.   : Review of Systems:  Out of a complete 14 point review of systems, all are reviewed and negative with the exception of these symptoms as listed below:   Review of Systems  Constitutional: Positive for fever, activity change (disinterest in activities), fatigue and unexpected weight change.  HENT: Positive for trouble swallowing.   Eyes: Positive for pain and visual disturbance.  Respiratory: Positive for cough and shortness of breath.        Snoring  Cardiovascular: Positive for palpitations.  Gastrointestinal: Positive for constipation.  Endocrine: Positive for cold intolerance and heat intolerance.  Musculoskeletal: Positive for myalgias.  Allergic/Immunologic: Positive for environmental allergies.  Neurological: Positive for dizziness, weakness and headaches.       Memory loss  Hematological: Bruises/bleeds easily.  Psychiatric/Behavioral: Positive for confusion, sleep disturbance and dysphoric mood. The patient is nervous/anxious.     Objective:  Neurologic Exam  Physical Exam Physical Examination:   Filed Vitals:   11/14/12 1052  BP: 142/65  Pulse: 71  Temp: 98.2 F (36.8 C)    General Examination: The patient is a very pleasant 67 y.o. female in no acute distress. She appears well-developed and well-nourished and very well groomed. She is obese. She is anxious appearing.   HEENT: Normocephalic, atraumatic, pupils are equal, round and reactive to light and accommodation. Funduscopic exam is normal with sharp disc margins noted. Extraocular tracking is good without limitation to gaze excursion or nystagmus noted. Normal smooth pursuit is noted. Hearing is grossly intact. Face is symmetric with normal facial animation and normal facial sensation. Speech is clear with no dysarthria noted. There is no hypophonia. There is no lip, neck/head, jaw or voice tremor. Neck is supple with full range  of passive and active motion. There are no carotid bruits on auscultation. Oropharynx  exam reveals: mild mouth dryness, adequate dental hygiene and moderate airway crowding, due to floppy soft palate. Mallampati is class II. Tongue protrudes centrally and palate elevates symmetrically. Tonsils are absent. Neck size is 15 inches.   Chest: Clear to auscultation without wheezing, rhonchi or crackles noted.  Heart: S1+S2+0, regular and normal without murmurs, rubs or gallops noted.   Abdomen: Soft, non-tender and non-distended with normal bowel sounds appreciated on auscultation.  Extremities: There is no pitting edema in the distal lower extremities bilaterally. Pedal pulses are intact.  Skin: Warm and dry without trophic changes noted. There are no varicose veins.  Musculoskeletal: exam reveals no obvious joint deformities, tenderness or joint swelling or erythema.   Neurologically:  Mental status: The patient is awake, alert and oriented in all 4 spheres. Her memory, attention, language and knowledge are appropriate. There is no aphasia, agnosia, apraxia or anomia. Speech is clear with normal prosody and enunciation. Thought process is linear. Mood is mildly depressed and affect is blunted.  Cranial nerves are as described above under HEENT exam. In addition, shoulder shrug is normal with equal shoulder height noted. Motor exam: Normal bulk, strength and tone is noted. There is no drift, tremor or rebound. Romberg is negative. Reflexes are 2+ throughout. Toes are downgoing bilaterally. Fine motor skills are intact with normal finger taps, normal hand movements, normal rapid alternating patting, normal foot taps and normal foot agility.  Cerebellar testing shows no dysmetria or intention tremor on finger to nose testing. There is no truncal or gait ataxia.  Sensory exam is intact to light touch, pinprick, vibration, temperature sense in the upper and lower extremities.  Gait, station and balance: she stands up with mild difficulty and walks slightly insecurely. No veering to one  side is noted. No leaning to one side is noted. Posture is age-appropriate and stance is narrow based. No problems turning are noted other than slight insecurity. She turns en bloc. Intact toe and heel stance is noted.               Assessment and Plan:   In summary, [name] is a very pleasant 67 y.o.-year old female with an underlying medical history of thyroid disease (Hashimoto's, s/p thyroidectomy), arthritis in her neck and knees, IBS, hypertension, depression, anxiety, paroxysmal supraventricular tachycardia, vitamin B12 deficiency and confusional episodes, who presents for follow up of her memory, fatigue, and gait disorder. She seems to be doing fairly well, with regards to her gait. Her memory is subjectively worse, but I did not pick up on any major changes. She has c/o daytime tiredness, snoring and mood d/o and given her obesity, she may have underlying OSA. However, she would like to hold off on a sleep study at this time until she has had her thyroid issue resolved. She recently had a change in her thyroid medication and a recheck of her thyroid function. Those test results are pending. Otherwise, her physical exam is reassuringly nonfocal. She is reassured in that regard. Residual issues are depression, anxiety, some constitutional symptoms which may be attributable to her thyroid disorder, and she has a followup appointment pending with her psychiatrist. She is wondering whether she should see an endocrinologist again. I encouraged her to discuss her laboratory findings with her primary care provider and and also discuss whether she does or does not need to see a endocrinologists. From my end of things I did  not suggest any medication changes and would like to follow her up in 6 months, sooner if the need arises. In the interim, if she is agreeable to proceeding with a sleep study which I think would be beneficial, we can certainly order one for her. She was in agreement. She is  encouraged to call with any other questions, concerns, problems or updates. She is advised to stay active mentally and physically. She is advised to word puzzles, perhaps go on [email or web address] and keep up with current events. I asked her to maintain a healthy lifestyle in general. I encouraged the patient to eat healthy, exercise daily and keep well hydrated, to keep a scheduled bedtime and wake time routine, to not skip any meals and eat healthy snacks in between meals and to have protein with every meal.  Most of my 40 minute visit today was spent in counseling and coordination of care, reviewing prior test results and reviewing medication.

## 2012-11-28 DIAGNOSIS — F333 Major depressive disorder, recurrent, severe with psychotic symptoms: Secondary | ICD-10-CM | POA: Diagnosis not present

## 2012-12-08 ENCOUNTER — Encounter: Payer: Self-pay | Admitting: Internal Medicine

## 2012-12-08 ENCOUNTER — Ambulatory Visit (INDEPENDENT_AMBULATORY_CARE_PROVIDER_SITE_OTHER): Payer: Medicare Other | Admitting: Internal Medicine

## 2012-12-08 VITALS — BP 106/58 | HR 62 | Temp 98.0°F | Resp 10 | Ht 61.5 in | Wt 178.7 lb

## 2012-12-08 DIAGNOSIS — E063 Autoimmune thyroiditis: Secondary | ICD-10-CM | POA: Diagnosis not present

## 2012-12-08 DIAGNOSIS — E890 Postprocedural hypothyroidism: Secondary | ICD-10-CM | POA: Insufficient documentation

## 2012-12-08 NOTE — Patient Instructions (Signed)
Please continue Synthroid at 112 mcg daily in am. Please come back for a follow-up appointment in 3 months.

## 2012-12-08 NOTE — Progress Notes (Signed)
Patient ID: [name], female   DOB: 02-17-1945, 67 y.o.   MRN: [id number]   HPI  [name] is a 67 y.o.-year-old female, self referred (recommended form Dr Rinaldo Cloud office), for evaluation for hypothyroidism. She saw Dr Talmage Nap in the past.   Pt. has been dx with Hashimoto hypothyroidism in ~2004; then had thyroidectomy in 10/24/2007 for an FNA showing atypical cells >> final EX:[id number] thyroiditis.  Pt is on Synthroid 112 mcg (changes from 125 mcg 2 mo ago), taken: - fasting - with water - separated by >30 min from b'fast  - not taking calcium, iron, multivitamins  - separated by >4h from Dexilat (prn) She was on Armour Thyroid in the past and also on Cytomel >> did not feel well on these.  I reviewed pt's thyroid tests: 11/07/2012: TSH 1.006, fT4 1.28, TT3 94.6 (80-204)Vit D 52, CMP normal 08/15/2012: TSH 0.181, fT4 1.47 (dose decreased from 125 to 112) Lab Results  Component Value Date   TSH 1.522 09/17/2011     she c/o: - + weight gain - + fatigue - + constipation/nausea/bloating - + hair falling - + hot flushes/+ getting cold - + anxiety/+ depression  She has a + FH of thyroid disorders in: mother >> thyroidectomy for goiter; also brother, cousin No FH of thyroid cancer. No h/o radiation tx to head or neck.  I reviewed her chart and she also has a history of high platelets (in the 400s), also HTN and HL. No anemia Checked 2x for celiac ds: negative. Has h/o B12 def., gets B12 injections.  ROS: Constitutional: see HPI, + nocturia Eyes: + blurry vision, no xerophthalmia ENT: no sore throat, + nodules palpated in throat, + dysphagia/no odynophagia, no hoarseness, + decreased hearing Cardiovascular: no CP/+ SOB/+palpitations/leg swelling Respiratory: no cough/+ SOB Gastrointestinal: +N/noV/noD/+C/+ GERD Musculoskeletal: no muscle/joint aches Skin: no rashes, + hair loss,  Easy bruising Neurological: no tremors/numbness/tingling/dizziness Psychiatric: +  depression/+ anxiety  Past Medical History  Diagnosis Date  . Essential hypertension, benign   . Hyperlipidemia   . Bipolar disorder   . Hashimoto's thyroiditis   . Allergic rhinitis   . GERD (gastroesophageal reflux disease)   . B12 deficiency   . History of transient ischemic attack (TIA)     Or possibly migraine as well as right amaurosis fugax and ataxia - Dr. Sandria Manly  . PSVT (paroxysmal supraventricular tachycardia)    Past Surgical History  Procedure Laterality Date  . Laparoscopic cholecystectomy  2010  . Thyroidectomy  2009    Current Outpatient Prescriptions on File Prior to Visit  Medication Sig Dispense Refill  . Acetaminophen (APAP 500 PO) Take 1 tablet by mouth every 6 (six) hours as needed.      Marland Kitchen aspirin 81 MG tablet Take 81 mg by mouth daily.      Marland Kitchen buPROPion (WELLBUTRIN XL) 150 MG 24 hr tablet Take 300 mg by mouth daily.       . Cholecalciferol 1000 UNITS tablet Take 1,000 Units by mouth daily.      . clonazePAM (KLONOPIN) 1 MG tablet Take 1 mg by mouth 3 (three) times daily as needed for anxiety.      . cyanocobalamin (,VITAMIN B-12,) 1000 MCG/ML injection Inject 1,000 mcg into the muscle once.      . cyclobenzaprine (FLEXERIL) 10 MG tablet Take 10 mg by mouth 3 (three) times daily as needed for muscle spasms.      Marland Kitchen dexlansoprazole (DEXILANT) 60 MG capsule Take 60 mg by mouth  daily.      . diltiazem (CARDIZEM CD) 180 MG 24 hr capsule Take 180 mg by mouth daily.      Marland Kitchen ESTRACE VAGINAL 0.1 MG/GM vaginal cream       . folic acid (FOLVITE) 1 MG tablet Take 1 mg by mouth daily.      . hyoscyamine (LEVSIN, ANASPAZ) 0.125 MG tablet Take 0.125 mg by mouth every 4 (four) hours as needed for cramping.      Marland Kitchen levothyroxine (SYNTHROID, LEVOTHROID) 112 MCG tablet Take 112 mcg by mouth daily before breakfast.      . losartan (COZAAR) 50 MG tablet Take 50 mg by mouth daily.      . metroNIDAZOLE (METROGEL) 0.75 % gel Apply 1 application topically 2 (two) times daily.      .  promethazine (PHENERGAN) 25 MG tablet Take 25 mg by mouth every 6 (six) hours as needed for nausea.      Marland Kitchen venlafaxine XR (EFFEXOR-XR) 150 MG 24 hr capsule Take 150 mg by mouth daily.      . Biotin (PA BIOTIN) 1000 MCG tablet Take 1,000 mcg by mouth daily.      . Probiotic Product (PROBIOTIC DAILY) CAPS Take 1 capsule by mouth daily.      . vitamin E 200 UNIT capsule Take 200 Units by mouth daily.       No current facility-administered medications on file prior to visit.   Allergies  Allergen Reactions  . Amoxicillin-Pot Clavulanate   . Atenolol   . Sulfonamide Derivatives    Family History  Problem Relation Age of Onset  . Depression Father   . Stroke Mother    PE: BP 106/58  Pulse 62  Temp(Src) 98 F (36.7 C) (Oral)  Resp 10  Ht 5' 1.5" (1.562 m)  Wt 178 lb 11.2 oz (81.058 kg)  BMI 33.22 kg/m2  SpO2 98% Wt Readings from Last 3 Encounters:  12/08/12 178 lb 11.2 oz (81.058 kg)  11/14/12 179 lb (81.194 kg)  02/24/12 181 lb (82.101 kg)   Constitutional: overweight, in NAD Eyes: PERRLA, EOMI, no exophthalmos ENT: moist mucous membranes, no thyromegaly, healed cervical scar, no cervical lymphadenopathy Cardiovascular: RRR, No MRG Respiratory: CTA B Gastrointestinal: abdomen soft, NT, ND, BS+ Musculoskeletal: no deformities, strength intact in all 4 Skin: moist, warm, no rashes Neurological: no tremor with outstretched hands, DTR normal in all 4  ASSESSMENT: 1. Hypothyroidism  PLAN:  1. Patient with long-standing hypothyroidism, on Synthroid therapy. She appears euthyroid. - We discussed about correct intake of levothyroxine, fasting, with water, separated by at least 30 minutes from breakfast, and separated by more than 4 hours from calcium, iron, multivitamins, acid reflux medications (PPIs). - She had a thyroidectomy in 2009, for goiter, nonmalignant - recent TFTs normal, I reassured pt that the current dose of Synthroid is working well.  - if test start to fluctuate  again, may need Tirosint for more consistent absorption - we reviewed her labs from outside and I tried to answer all her questions regarding:  The need for free t3 >> not needed  The importance of checking reverse T3 >> not needed  Using Armour instead of T4 (used it before, did not feel better) >> explained negative aspects of Armour  Taking iron since her ferritin is 13 >> OK to take a low dose supplement  Why are her Platelets high? >> I am not sure, could be from inflammation  Discussed the benefits of a plant based diet in reducing  inflammation   the benefit of a gluten free diet >> she should try this especially since she has bloating and other GI sxs - RTC in 3 months

## 2012-12-12 ENCOUNTER — Other Ambulatory Visit: Payer: Self-pay | Admitting: *Deleted

## 2012-12-12 ENCOUNTER — Telehealth: Payer: Self-pay | Admitting: *Deleted

## 2012-12-12 ENCOUNTER — Other Ambulatory Visit: Payer: Self-pay | Admitting: Internal Medicine

## 2012-12-12 DIAGNOSIS — E063 Autoimmune thyroiditis: Secondary | ICD-10-CM | POA: Diagnosis not present

## 2012-12-12 NOTE — Telephone Encounter (Signed)
Orders put in the computer, printed and faxed to [phone].

## 2012-12-12 NOTE — Telephone Encounter (Signed)
Pt called and lvm stating that she still feels off. She is still fatigued, her hair is falling out like crazy and her ability to do things are "off." Pt had her B12 injection but feels no change. Pt wants to know if she can have labs done at the [facility] there in [place]. [phone]) Please advise.

## 2012-12-12 NOTE — Telephone Encounter (Signed)
Yes, can you please enter TSH and fT4 - she can have it done there.

## 2012-12-13 LAB — TSH: TSH: 0.84 u[IU]/mL (ref 0.350–4.500)

## 2012-12-25 ENCOUNTER — Other Ambulatory Visit: Payer: Self-pay | Admitting: Dermatology

## 2012-12-25 DIAGNOSIS — L820 Inflamed seborrheic keratosis: Secondary | ICD-10-CM | POA: Diagnosis not present

## 2012-12-25 DIAGNOSIS — L659 Nonscarring hair loss, unspecified: Secondary | ICD-10-CM | POA: Diagnosis not present

## 2012-12-25 DIAGNOSIS — D485 Neoplasm of uncertain behavior of skin: Secondary | ICD-10-CM | POA: Diagnosis not present

## 2012-12-25 DIAGNOSIS — L819 Disorder of pigmentation, unspecified: Secondary | ICD-10-CM | POA: Diagnosis not present

## 2012-12-26 DIAGNOSIS — H43819 Vitreous degeneration, unspecified eye: Secondary | ICD-10-CM | POA: Diagnosis not present

## 2013-01-12 DIAGNOSIS — Z1231 Encounter for screening mammogram for malignant neoplasm of breast: Secondary | ICD-10-CM | POA: Diagnosis not present

## 2013-01-31 ENCOUNTER — Ambulatory Visit (INDEPENDENT_AMBULATORY_CARE_PROVIDER_SITE_OTHER): Payer: Medicare Other | Admitting: Adult Health

## 2013-01-31 ENCOUNTER — Encounter: Payer: Self-pay | Admitting: Adult Health

## 2013-01-31 VITALS — BP 135/67 | HR 71 | Ht 61.5 in | Wt 177.0 lb

## 2013-01-31 DIAGNOSIS — I10 Essential (primary) hypertension: Secondary | ICD-10-CM

## 2013-01-31 DIAGNOSIS — I471 Supraventricular tachycardia, unspecified: Secondary | ICD-10-CM

## 2013-01-31 DIAGNOSIS — E785 Hyperlipidemia, unspecified: Secondary | ICD-10-CM

## 2013-01-31 NOTE — Assessment & Plan Note (Signed)
She continues to have occasional palpitations, but not as much as before. She has stopped caffeine. She is to see PCP for TSH and other labs in 2 weeks.

## 2013-01-31 NOTE — Assessment & Plan Note (Signed)
She will have fasting labs drawn with PCP.

## 2013-01-31 NOTE — Patient Instructions (Addendum)
Your physician recommends that you schedule a follow-up appointment in: 6 months with Dr McDowell You will receive a reminder letter two months in advance reminding you to call and schedule your appointment. If you don't receive this letter, please contact our office.  Your physician recommends that you continue on your current medications as directed. Please refer to the Current Medication list given to you today.   

## 2013-01-31 NOTE — Assessment & Plan Note (Signed)
Blood pressure is well controlled. No changes in medications.   She is requesting a sleep study, due to daytime somulance. She could not make up her mind, but now has decided to wait to see PCP for plans for this study.

## 2013-01-31 NOTE — Progress Notes (Deleted)
Name: [name]    DOB: 1945/12/21  Age: 68 y.o.  MR#: [id number]       PCP:  Delphina Cahill, MD      Insurance: Payor: MEDICARE / Plan: MEDICARE PART B / Product Type: *No Product type* /   CC:    Chief Complaint  Patient presents with  . Tachycardia    VS Filed Vitals:   01/31/13 1558  BP: 135/67  Pulse: 71  Height: 5' 1.5" (1.562 m)  Weight: 177 lb (80.287 kg)    Weights Current Weight  01/31/13 177 lb (80.287 kg)  12/08/12 178 lb 11.2 oz (81.058 kg)  11/14/12 179 lb (81.194 kg)    Blood Pressure  BP Readings from Last 3 Encounters:  01/31/13 135/67  12/08/12 106/58  11/14/12 142/65     Admit date:  (Not on file) Last encounter with RMR:  Visit date not found   Allergy Amoxicillin-pot clavulanate; Atenolol; and Sulfonamide derivatives  Current Outpatient Prescriptions  Medication Sig Dispense Refill  . Acetaminophen (APAP 500 PO) Take 1 tablet by mouth every 6 (six) hours as needed.      Marland Kitchen aspirin 81 MG tablet Take 81 mg by mouth daily.      . Biotin (PA BIOTIN) 1000 MCG tablet Take 1,000 mcg by mouth daily.      Marland Kitchen buPROPion (WELLBUTRIN XL) 150 MG 24 hr tablet Take 300 mg by mouth daily.       . Cholecalciferol 1000 UNITS tablet Take 1,000 Units by mouth daily.      . clonazePAM (KLONOPIN) 1 MG tablet Take 1 mg by mouth 3 (three) times daily as needed for anxiety.      . cyanocobalamin (,VITAMIN B-12,) 1000 MCG/ML injection Inject 1,000 mcg into the muscle once.      . cyclobenzaprine (FLEXERIL) 10 MG tablet Take 10 mg by mouth 3 (three) times daily as needed for muscle spasms.      Marland Kitchen dexlansoprazole (DEXILANT) 60 MG capsule Take 60 mg by mouth daily.      Marland Kitchen diltiazem (CARDIZEM CD) 180 MG 24 hr capsule Take 180 mg by mouth daily.      Marland Kitchen ESTRACE VAGINAL 0.1 MG/GM vaginal cream       . folic acid (FOLVITE) 1 MG tablet Take 1 mg by mouth daily.      . hyoscyamine (LEVSIN, ANASPAZ) 0.125 MG tablet Take 0.125 mg by mouth every 4 (four) hours as needed for cramping.       Marland Kitchen levothyroxine (SYNTHROID, LEVOTHROID) 112 MCG tablet Take 112 mcg by mouth daily before breakfast.      . losartan (COZAAR) 50 MG tablet Take 50 mg by mouth daily.      . metroNIDAZOLE (METROGEL) 0.75 % gel Apply 1 application topically 2 (two) times daily.      . Probiotic Product (PROBIOTIC DAILY) CAPS Take 1 capsule by mouth daily.      . promethazine (PHENERGAN) 25 MG tablet Take 25 mg by mouth every 6 (six) hours as needed for nausea.      Marland Kitchen venlafaxine XR (EFFEXOR-XR) 150 MG 24 hr capsule Take 150 mg by mouth daily.       No current facility-administered medications for this visit.    Discontinued Meds:    Medications Discontinued During This Encounter  Medication Reason  . vitamin E 200 UNIT capsule Error    Patient Active Problem List   Diagnosis Date Noted  . Hashimoto's thyroiditis 12/08/2012  . PSVT (paroxysmal supraventricular  tachycardia) 03/12/2011  . HYPERLIPIDEMIA 11/06/2009  . ESSENTIAL HYPERTENSION, BENIGN 11/06/2009  . PALPITATIONS 11/06/2009    LABS    Component Value Date/Time   NA 136 09/17/2011 1700   NA 137 06/02/2008 0425   NA 138 05/31/2008 1816   K 4.7 09/17/2011 1700   K 3.7 06/02/2008 0425   K 3.6 05/31/2008 1816   CL 98 09/17/2011 1700   CL 105 06/02/2008 0425   CL 105 05/31/2008 1816   CO2 28 09/17/2011 1700   CO2 28 06/02/2008 0425   CO2 27 05/31/2008 1816   GLUCOSE 73 09/17/2011 1700   GLUCOSE 120* 06/02/2008 0425   GLUCOSE 105* 05/31/2008 1816   BUN 7 09/17/2011 1700   BUN 3* 06/02/2008 0425   BUN 3* 05/31/2008 1816   CREATININE 0.65 09/17/2011 1700   CREATININE 0.70 06/02/2008 0425   CREATININE 0.74 05/31/2008 1816   CREATININE 0.73 05/28/2008 1400   CALCIUM 9.8 09/17/2011 1700   CALCIUM 8.6 06/02/2008 0425   CALCIUM 8.5 05/31/2008 1816   GFRNONAA >60 06/02/2008 0425   GFRNONAA >60 05/31/2008 1816   GFRNONAA >60 05/28/2008 1400   GFRAA  Value: >60        The eGFR has been calculated using the MDRD equation. This calculation has not been validated in all clinical  situations. eGFR's persistently <60 mL/min signify possible Chronic Kidney Disease. 06/02/2008 0425   GFRAA  Value: >60        The eGFR has been calculated using the MDRD equation. This calculation has not been validated in all clinical situations. eGFR's persistently <60 mL/min signify possible Chronic Kidney Disease. 05/31/2008 1816   GFRAA  Value: >60        The eGFR has been calculated using the MDRD equation. This calculation has not been validated in all clinical situations. eGFR's persistently <60 mL/min signify possible Chronic Kidney Disease. 05/28/2008 1400   CMP     Component Value Date/Time   NA 136 09/17/2011 1700   K 4.7 09/17/2011 1700   CL 98 09/17/2011 1700   CO2 28 09/17/2011 1700   GLUCOSE 73 09/17/2011 1700   BUN 7 09/17/2011 1700   CREATININE 0.65 09/17/2011 1700   CREATININE 0.70 06/02/2008 0425   CALCIUM 9.8 09/17/2011 1700   PROT 7.2 09/17/2011 1700   ALBUMIN 4.5 09/17/2011 1700   AST 16 09/17/2011 1700   ALT 11 09/17/2011 1700   ALKPHOS 74 09/17/2011 1700   BILITOT 0.3 09/17/2011 1700   GFRNONAA >60 06/02/2008 0425   GFRAA  Value: >60        The eGFR has been calculated using the MDRD equation. This calculation has not been validated in all clinical situations. eGFR's persistently <60 mL/min signify possible Chronic Kidney Disease. 06/02/2008 0425       Component Value Date/Time   WBC 7.6 09/17/2011 1700   WBC 17.1* 06/02/2008 0425   WBC 16.7* 06/01/2008 0350   HGB 12.7 09/17/2011 1700   HGB 11.5* 06/02/2008 0425   HGB 12.0 06/01/2008 0350   HCT 37.2 09/17/2011 1700   HCT 32.9* 06/02/2008 0425   HCT 33.8* 06/01/2008 0350   MCV 89.0 09/17/2011 1700   MCV 90.7 06/02/2008 0425   MCV 88.3 06/01/2008 0350    Lipid Panel  No results found for this basename: chol, trig, hdl, cholhdl, vldl, ldlcalc    ABG No results found for this basename: phart, pco2, pco2art, po2, po2art, hco3, tco2, acidbasedef, o2sat     Lab Results  Component Value  Date   TSH 0.840 12/12/2012   BNP (last 3 results) No  results found for this basename: PROBNP,  in the last 8760 hours Cardiac Panel (last 3 results) No results found for this basename: CKTOTAL, CKMB, TROPONINI, RELINDX,  in the last 72 hours  Iron/TIBC/Ferritin No results found for this basename: iron, tibc, ferritin     EKG Orders placed in visit on 01/31/13  . EKG 12-LEAD     Prior Assessment and Plan Problem List as of 01/31/2013   HYPERLIPIDEMIA   ESSENTIAL HYPERTENSION, BENIGN   Last Assessment & Plan   09/17/2011 Office Visit Written 09/17/2011  4:23 PM by Lendon Colonel, NP     Blood pressure is well-controlled currently at 118/76. Her pulse is in the 90s. She will continue the losartan 50 mg daily. She is to continue all medications as directed without changing doses on her without informing medical  provider.    PALPITATIONS   Last Assessment & Plan   02/05/2011 Office Visit Written 02/05/2011 12:32 PM by Satira Sark, MD     Still could be that she is experiencing symptomatic ectopy, although with documentation of possible PSVT as well. It is not however clear that her symptoms are completely explained by this, particularly interesting to note that she did not have any major symptoms while wearing her monitor. We discussed this, and will begin a trial of low-dose atenolol 25 mg in the evening. She will let us know how she tolerates this.    PSVT (paroxysmal supraventricular tachycardia)   Last Assessment & Plan   09/06/2012 Office Visit Written 09/06/2012  2:54 PM by Satira Sark, MD     Tolerating Cardizem CD better than previous beta blocker. ECG reviewed and stable. No changes made today. Incidentally, might consider asking her primary care provider about reducing Synthroid replacement with low TSH. May help to prevent palpitations and recurrent arrhythmia.    Hashimoto's thyroiditis       Imaging: No results found.

## 2013-01-31 NOTE — Progress Notes (Signed)
HPI: [name] is a 68 year old patient of Dr. Domenic Polite we are following for ongoing assessment and management of palpitations with history of PSVT. The patient had a ischemic cardiac workup which was negative. She was last seen by Dr. Domenic Polite in August of 2014. He was continued on Cardizem CD. She was advised to speak with her primary care physician about thyroid replacement therapy titration. She was also continued on all medications without changes.   She comes today with multiple somatic complaints. She states she is waking up at night short of breath. She is is often tired with times when she sleeps a lot during the day. She brings with her a list of symptoms and problems.  Allergies  Allergen Reactions  . Amoxicillin-Pot Clavulanate   . Atenolol   . Sulfonamide Derivatives     Current Outpatient Prescriptions  Medication Sig Dispense Refill  . Acetaminophen (APAP 500 PO) Take 1 tablet by mouth every 6 (six) hours as needed.      Marland Kitchen aspirin 81 MG tablet Take 81 mg by mouth daily.      . Biotin (PA BIOTIN) 1000 MCG tablet Take 1,000 mcg by mouth daily.      Marland Kitchen buPROPion (WELLBUTRIN XL) 150 MG 24 hr tablet Take 300 mg by mouth daily.       . Cholecalciferol 1000 UNITS tablet Take 1,000 Units by mouth daily.      . clonazePAM (KLONOPIN) 1 MG tablet Take 1 mg by mouth 3 (three) times daily as needed for anxiety.      . cyanocobalamin (,VITAMIN B-12,) 1000 MCG/ML injection Inject 1,000 mcg into the muscle once.      . cyclobenzaprine (FLEXERIL) 10 MG tablet Take 10 mg by mouth 3 (three) times daily as needed for muscle spasms.      Marland Kitchen dexlansoprazole (DEXILANT) 60 MG capsule Take 60 mg by mouth daily.      Marland Kitchen diltiazem (CARDIZEM CD) 180 MG 24 hr capsule Take 180 mg by mouth daily.      Marland Kitchen ESTRACE VAGINAL 0.1 MG/GM vaginal cream       . folic acid (FOLVITE) 1 MG tablet Take 1 mg by mouth daily.      . hyoscyamine (LEVSIN, ANASPAZ) 0.125 MG tablet Take 0.125 mg by mouth every 4 (four)  hours as needed for cramping.      Marland Kitchen levothyroxine (SYNTHROID, LEVOTHROID) 112 MCG tablet Take 112 mcg by mouth daily before breakfast.      . losartan (COZAAR) 50 MG tablet Take 50 mg by mouth daily.      . metroNIDAZOLE (METROGEL) 0.75 % gel Apply 1 application topically 2 (two) times daily.      . Probiotic Product (PROBIOTIC DAILY) CAPS Take 1 capsule by mouth daily.      . promethazine (PHENERGAN) 25 MG tablet Take 25 mg by mouth every 6 (six) hours as needed for nausea.      Marland Kitchen venlafaxine XR (EFFEXOR-XR) 150 MG 24 hr capsule Take 150 mg by mouth daily.       No current facility-administered medications for this visit.    Past Medical History  Diagnosis Date  . Essential hypertension, benign   . Hyperlipidemia   . Bipolar disorder   . Hashimoto's thyroiditis   . Allergic rhinitis   . GERD (gastroesophageal reflux disease)   . B12 deficiency   . History of transient ischemic attack (TIA)     Or possibly migraine as well as right amaurosis fugax and  ataxia - Dr. Erling Cruz  . PSVT (paroxysmal supraventricular tachycardia)     Past Surgical History  Procedure Laterality Date  . Laparoscopic cholecystectomy  2010  . Thyroidectomy  2009    YFV:CBSWHQ of systems complete and found to be negative unless listed above  PHYSICAL EXAM BP 135/67  Pulse 71  Ht 5' 1.5" (1.562 m)  Wt 177 lb (80.287 kg)  BMI 32.91 kg/m2  General: Well developed, well nourished, in no acute distress Head: Eyes PERRLA, No xanthomas.   Normal cephalic and atramatic  Lungs: Clear bilaterally to auscultation and percussion. Heart: HRRR S1 S2, without MRG.  Pulses are 2+ & equal.            No carotid bruit. No JVD.  No abdominal bruits. No femoral bruits. Abdomen: Bowel sounds are positive, abdomen soft and non-tender without masses or                  Hernia's noted. Msk:  Back normal, normal gait. Normal strength and tone for age. Extremities: No clubbing, cyanosis or edema.  DP +1 Neuro: Alert and  oriented X 3. Psych:  Good affect, responds appropriately   ASSESSMENT AND PLAN

## 2013-02-16 ENCOUNTER — Telehealth: Payer: Self-pay | Admitting: *Deleted

## 2013-02-16 NOTE — Telephone Encounter (Signed)
Pt has labs from [place] and has labs scheduled next month so we do not need to order any for her

## 2013-02-16 NOTE — Telephone Encounter (Signed)
Pt is requesting a complete blood panel before she considers sleep study. Can we order that for her?

## 2013-03-06 ENCOUNTER — Telehealth: Payer: Self-pay | Admitting: Neurology

## 2013-03-06 NOTE — Telephone Encounter (Signed)
Patient calling back as she has a question for [employer].

## 2013-03-06 NOTE — Telephone Encounter (Signed)
Called patient to inform her that Dr. Rexene Alberts stated that she would have to follow up with her PCP for the pain in eye and ear. I also informed her that Dr. Rexene Alberts wanted to know if she would be willing to have a sleep study done as they had discuss and that she could set it up with [place] or outside the practice. Patient stated that she would wait until her next appt. To talk about it. FYI

## 2013-03-06 NOTE — Telephone Encounter (Signed)
Calling because she is having pressure over and around her eye. She has been to her eye Dr and he suggests that she comes in and sees Dr. Rexene Alberts please call

## 2013-03-06 NOTE — Telephone Encounter (Signed)
Called patient to get more information concerning her right eye pain and pressure that she is having. Patient states that she has been having this problem for many months. It is also causing ear pain. The pressure is shooting down her neck. Patient also states that she has been having memory loss and confusion. Patient has an appt on 05/15/13. Please advise.

## 2013-03-06 NOTE — Telephone Encounter (Signed)
Eye or ear pain is really not something I have been following her for. She has a history of memory loss and we talked about doing a sleep study as she had some concerning symptoms for sleep apnea. If she has severe pain radiating to her neck she may need to see an ear nose throat doctor. She can request a referral from her primary care physician or we can do a referral. I would recommend pursuing a sleep study however as sleep apnea can cause recurrent headaches. Poor sleep can also have an impact on her mental functioning as well as her memory. She had previously indicated that she wanted to think about the sleep study. She can pursue this outside or with [place]. Please discuss with patient and let me know.

## 2013-03-06 NOTE — Telephone Encounter (Signed)
Returned patient's call concerning a question that she had about having the pain and pressure behind eye and ear. I explained to the patient that Dr. Rexene Alberts stated that she should see a ear, nose and throat doctor to have it checked out to see if something was going on. Patient stated that she would make appt to get that checked out. I advised the patient that if she has any other problems, questions or concerns to call the office. Patient verbalized understanding.

## 2013-03-06 NOTE — Telephone Encounter (Signed)
Patient returning call, please call patient back.  °

## 2013-03-12 DIAGNOSIS — G43809 Other migraine, not intractable, without status migrainosus: Secondary | ICD-10-CM | POA: Diagnosis not present

## 2013-03-12 DIAGNOSIS — H5710 Ocular pain, unspecified eye: Secondary | ICD-10-CM | POA: Diagnosis not present

## 2013-03-16 ENCOUNTER — Encounter: Payer: Self-pay | Admitting: Internal Medicine

## 2013-03-16 ENCOUNTER — Ambulatory Visit (INDEPENDENT_AMBULATORY_CARE_PROVIDER_SITE_OTHER): Payer: Medicare Other | Admitting: Internal Medicine

## 2013-03-16 VITALS — BP 118/62 | HR 75 | Temp 98.3°F | Resp 12 | Wt 182.0 lb

## 2013-03-16 DIAGNOSIS — E063 Autoimmune thyroiditis: Secondary | ICD-10-CM

## 2013-03-16 DIAGNOSIS — E890 Postprocedural hypothyroidism: Secondary | ICD-10-CM | POA: Diagnosis not present

## 2013-03-16 LAB — TSH: TSH: 0.38 u[IU]/mL (ref 0.35–5.50)

## 2013-03-16 LAB — T4, FREE: FREE T4: 1.06 ng/dL (ref 0.60–1.60)

## 2013-03-16 NOTE — Patient Instructions (Signed)
Please come back for a follow-up appointment in 6 months.   

## 2013-03-16 NOTE — Progress Notes (Signed)
Patient ID: [name], female   DOB: 22-May-1945, 68 y.o.   MRN: [id number]   HPI  [name] is a 68 y.o.-year-old female, returning for f/u for hypothyroidism. Last visit 3 mo ago.  Reviewed hx: Pt. has been dx with Hashimoto hypothyroidism in ~2004; then had thyroidectomy in 10/24/2007 for an FNA showing atypical cells >> final [id number] thyroiditis.  Pt is on Synthroid 112 mcg (changed from 125 mcg in 09/2012), taken: - fasting - with water - separated by >30 min from b'fast  - not taking calcium, iron, multivitamins  - separated by >4h from [place] (which isprn) She was on [employer]our Thyroid in the past and also on Cytomel >> did not feel well on these.  I reviewed pt's thyroid tests: 11/07/2012: TSH 1.006, fT4 1.28, TT3 94.6 (80-204), Vit D 52, CMP normal 08/15/2012: TSH 0.181, fT4 1.47 (dose decreased from 125 to 112) Lab Results  Component Value Date   TSH 0.840 12/12/2012   TSH 1.522 09/17/2011   FREET4 1.41 12/12/2012     she c/o: - ++ weight gain - ++ fatigue - + constipation/nausea/bloating/indigestion - + hair falling - + hot flushes/+ getting cold - + anxiety/+ depression - + memory problems - tongue irritated  She also has a history of high platelets (in the 400s), also HTN and HL. No anemia Checked 2x for celiac ds: negative. Has h/o B12 def., gets B12 injections.  ROS: Constitutional: see HPI Eyes: no blurry vision, no xerophthalmia ENT: no sore throat, + nodules palpated in throat, + dysphagia/no odynophagia, no hoarseness, + decreased hearing Cardiovascular: no CP/SOB/+palpitations/leg swelling Respiratory: no cough/SOB Gastrointestinal: +N/noV/noD/+C/+ bloating/+ GERD Musculoskeletal: no muscle/joint aches Skin: no rashes, + hair loss Neurological: no tremors/numbness/tingling/dizziness  I reviewed pt's medications, allergies, PMH, social hx, family hx and no changes required, except as mentioned above.  PE: BP 118/62  Pulse 75   Temp(Src) 98.3 F (36.8 C) (Oral)  Resp 12  Wt 182 lb (82.555 kg)  SpO2 95% Wt Readings from Last 3 Encounters:  03/16/13 182 lb (82.555 kg)  01/31/13 177 lb (80.287 kg)  12/08/12 178 lb 11.2 oz (81.058 kg)   Constitutional: overweight, in NAD Eyes: PERRLA, EOMI, no exophthalmos ENT: moist mucous membranes, no thyromegaly, healed cervical scar, no cervical lymphadenopathy Cardiovascular: RRR, No MRG Respiratory: CTA B Gastrointestinal: abdomen soft, NT, ND, BS+ Musculoskeletal: no deformities, strength intact in all 4 Skin: moist, warm, no rashes  ASSESSMENT: 1. Postsurgical hypothyroidism - s/p thyroidectomy in 2009, for goiter, nonmalignant  PLAN:  1. Patient with long-standing hypothyroidism, on Synthroid therapy. She appears euthyroid. Last set of TFTs 3 mo ago were normal.  - We again discussed about correct intake of levothyroxine, fasting, with water, separated by at least 30 minutes from breakfast, and separated by more than 4 hours from calcium, iron, multivitamins, acid reflux medications (PPIs). - will check TFTs today; discussed about the fact that checking a T3 will not impact management - if test start to fluctuate again, may need Tirosint for more consistent absorption - RTC in 6 months  Office Visit on 03/16/2013  Component Date Value Ref Range Status  . TSH 03/16/2013 0.38  0.35 - 5.50 uIU/mL Final  . Free T4 03/16/2013 1.06  0.60 - 1.60 ng/dL Final   Labs normal, recheck in 6 mo.

## 2013-03-19 ENCOUNTER — Encounter: Payer: Self-pay | Admitting: Internal Medicine

## 2013-03-21 ENCOUNTER — Telehealth: Payer: Self-pay | Admitting: *Deleted

## 2013-03-21 ENCOUNTER — Encounter: Payer: Self-pay | Admitting: *Deleted

## 2013-03-21 MED ORDER — LEVOTHYROXINE SODIUM 112 MCG PO TABS: 112.0000 ug | ORAL_TABLET | Freq: Every day | ORAL | Status: DC

## 2013-03-21 NOTE — Telephone Encounter (Signed)
Pt called requesting an rx refill for Synthroid (levothyroxine). (refill sent) Pt also asked me to send her the TSH results in the mail. Advised her I would. Pt also stated her hair is falling out and would like to know what she can do about this. Please advise.

## 2013-03-21 NOTE — Telephone Encounter (Signed)
I am not sure, but it's likely not from her thyroid as the tests are normal..Marland Kitchen

## 2013-03-28 NOTE — Telephone Encounter (Signed)
Called pt and lvm advising her per Dr Cruzita Lederer, the is unsure, but it is likely not from your thyroid, as your thyroid test are normal. Advised her to discuss with her PCP.

## 2013-04-03 DIAGNOSIS — E785 Hyperlipidemia, unspecified: Secondary | ICD-10-CM | POA: Diagnosis not present

## 2013-04-03 DIAGNOSIS — I10 Essential (primary) hypertension: Secondary | ICD-10-CM | POA: Diagnosis not present

## 2013-04-05 DIAGNOSIS — F329 Major depressive disorder, single episode, unspecified: Secondary | ICD-10-CM | POA: Diagnosis not present

## 2013-04-05 DIAGNOSIS — E039 Hypothyroidism, unspecified: Secondary | ICD-10-CM | POA: Diagnosis not present

## 2013-04-05 DIAGNOSIS — F3289 Other specified depressive episodes: Secondary | ICD-10-CM | POA: Diagnosis not present

## 2013-04-05 DIAGNOSIS — I10 Essential (primary) hypertension: Secondary | ICD-10-CM | POA: Diagnosis not present

## 2013-04-05 DIAGNOSIS — K219 Gastro-esophageal reflux disease without esophagitis: Secondary | ICD-10-CM | POA: Diagnosis not present

## 2013-04-09 ENCOUNTER — Telehealth: Payer: Self-pay | Admitting: Cardiology

## 2013-04-09 NOTE — Telephone Encounter (Signed)
I left message for patient that 81 mg ASA should not make her blood to "thin" in actuality it effects the viscosity of the blood.Fingers are very vascular areas and can bleed quite a bit.I don't know what she pricked her finger with.I advised her to hold continuous pressure for ten minutes to finger.

## 2013-04-13 ENCOUNTER — Other Ambulatory Visit (HOSPITAL_COMMUNITY): Payer: Self-pay

## 2013-04-13 DIAGNOSIS — G4710 Hypersomnia, unspecified: Secondary | ICD-10-CM

## 2013-04-13 DIAGNOSIS — G4730 Sleep apnea, unspecified: Principal | ICD-10-CM

## 2013-04-25 NOTE — Telephone Encounter (Signed)
Closing encounter

## 2013-05-08 DIAGNOSIS — H5200 Hypermetropia, unspecified eye: Secondary | ICD-10-CM | POA: Diagnosis not present

## 2013-05-08 DIAGNOSIS — H52229 Regular astigmatism, unspecified eye: Secondary | ICD-10-CM | POA: Diagnosis not present

## 2013-05-08 DIAGNOSIS — H43819 Vitreous degeneration, unspecified eye: Secondary | ICD-10-CM | POA: Diagnosis not present

## 2013-05-08 DIAGNOSIS — H524 Presbyopia: Secondary | ICD-10-CM | POA: Diagnosis not present

## 2013-05-11 ENCOUNTER — Encounter: Payer: Self-pay | Admitting: Neurology

## 2013-05-11 ENCOUNTER — Ambulatory Visit: Payer: Medicare Other | Attending: Internal Medicine | Admitting: Sleep Medicine

## 2013-05-11 VITALS — Ht 62.4 in | Wt 180.0 lb

## 2013-05-11 DIAGNOSIS — G4730 Sleep apnea, unspecified: Secondary | ICD-10-CM | POA: Diagnosis not present

## 2013-05-11 DIAGNOSIS — G4710 Hypersomnia, unspecified: Secondary | ICD-10-CM

## 2013-05-11 DIAGNOSIS — G4733 Obstructive sleep apnea (adult) (pediatric): Secondary | ICD-10-CM | POA: Diagnosis not present

## 2013-05-11 DIAGNOSIS — Z6833 Body mass index (BMI) 33.0-33.9, adult: Secondary | ICD-10-CM | POA: Diagnosis not present

## 2013-05-15 ENCOUNTER — Encounter (INDEPENDENT_AMBULATORY_CARE_PROVIDER_SITE_OTHER): Payer: Self-pay

## 2013-05-15 ENCOUNTER — Encounter: Payer: Self-pay | Admitting: Neurology

## 2013-05-15 ENCOUNTER — Ambulatory Visit (INDEPENDENT_AMBULATORY_CARE_PROVIDER_SITE_OTHER): Payer: Medicare Other | Admitting: Neurology

## 2013-05-15 VITALS — BP 138/75 | HR 75 | Ht 62.0 in | Wt 180.0 lb

## 2013-05-15 DIAGNOSIS — F3289 Other specified depressive episodes: Secondary | ICD-10-CM | POA: Diagnosis not present

## 2013-05-15 DIAGNOSIS — R269 Unspecified abnormalities of gait and mobility: Secondary | ICD-10-CM

## 2013-05-15 DIAGNOSIS — F329 Major depressive disorder, single episode, unspecified: Secondary | ICD-10-CM | POA: Diagnosis not present

## 2013-05-15 DIAGNOSIS — E079 Disorder of thyroid, unspecified: Secondary | ICD-10-CM | POA: Diagnosis not present

## 2013-05-15 DIAGNOSIS — F32A Depression, unspecified: Secondary | ICD-10-CM

## 2013-05-15 DIAGNOSIS — R413 Other amnesia: Secondary | ICD-10-CM

## 2013-05-15 NOTE — Progress Notes (Signed)
Subjective:    Patient ID: [name] is a 68 y.o. female.  HPI    Interim history:   [name] is a very pleasant 68 year old right-handed woman with an underlying medical history of thyroid disease (Hashimoto's, s/p thyroidectomy), arthritis in her neck and knees, IBS, hypertension, depression, anxiety, paroxysmal supraventricular tachycardia, vitamin B12 deficiency and confusional episodes, who presents for followup consultation of her episodic confusion. She is unaccompanied today. I first met her on 11/14/2012, at which time I felt that she was stable. Her memory was subjectively worse but I clinically felt that she was stable. I suggested a sleep study because of her history of snoring, obesity, and daytime somnolence reported that she elected to hold off on a sleep study until after resolving her thyroid issues. In the interim she called and reported ear pain on 03/06/2013. She was advised to seek consultation with ENT. She recently had a sleep study on 05/11/2013 at [facility], with report pending.    Today, she reports that she overall had not been feeling well, had chills, stomach issues, had been diagnosed by her ophthalmologist with optic migraines. She feels dazed at times. She has a harder time doing chores at home. She has more fatigue. She sees Dr. Reece Levy next month. She is going to see Dr. Janace Hoard in ENT.   She previously followed with Dr. Morene Antu and was last seen by him on 02/24/2012 at which time he felt her episodes may be related to her depression. I reviewed her labs from 08/15/2012 which indicated a low TSH at 0.181, a normal free T4, normal CMP, and a normal CBC with the exception of a slightly elevated platelet count at 432.  She had a reduction in her synthroid to 112 mcg and had her TSH retested recently. She feels, her memory is worse and her she has problems with attention and focusing. She has tics and suffers from fatigue. She has been more anxious  and is on Klonopin per Dr. Reece Levy. She also has depression and is on Effexor and Wellbutrin for this. She has trouble with sleep maintenance.  She has been noted to snore and wakes up non-refreshed. She endorses daytime somnolence. She has never had a sleep study. She has had her eyes checked. She has visual migraine auras without headaches.  She has had gait and balance problems. MRI brain and C-spine from July 2005 showed normal brain and internal auditory canals, cervical spondylosis and degenerative disc disease without frank herniation or cord compression or foraminal stenoses. VEP and BAER were normal in July 2005. She had right eye vision loss for 1 minute in February 2010. She has no history of migraine headaches. Carotid Doppler studies were normal in March 2010. She was started on aspirin on the presumed diagnosis of TIAs. MRI brain from March 2011 showed small vessel ischemic changes. She has a history of fainting spells since the 70s. She underwent Holter monitoring and was found to have paroxysmal supraventricular tachycardia and was placed on a beta blocker. EEG was normal in October 2013.  Her Past Medical History Is Significant For: Past Medical History  Diagnosis Date  . Essential hypertension, benign   . Hyperlipidemia   . Bipolar disorder   . Hashimoto's thyroiditis   . Allergic rhinitis   . GERD (gastroesophageal reflux disease)   . B12 deficiency   . History of transient ischemic attack (TIA)     Or possibly migraine as well as right amaurosis fugax and ataxia -  Dr. Erling Cruz  . PSVT (paroxysmal supraventricular tachycardia)     Her Past Surgical History Is Significant For: Past Surgical History  Procedure Laterality Date  . Laparoscopic cholecystectomy  2010  . Thyroidectomy  2009    Her Family History Is Significant For: Family History  Problem Relation Age of Onset  . Depression Father   . Stroke Mother     Her Social History Is Significant For: History   Social  History  . Marital Status: Married    Spouse Name: N/A    Number of Children: 2  . Years of Education: N/A   Occupational History  . homemaker    Social History Main Topics  . Smoking status: Former Smoker -- 0.50 packs/day for 10 years    Types: Cigarettes    Quit date: 01/26/1983  . Smokeless tobacco: Never Used     Comment: quit 30 years ago  . Alcohol Use: No  . Drug Use: No  . Sexual Activity: No   Other Topics Concern  . None   Social History Narrative   Regular exercise: walk when able   Caffeine use: occasionally    Her Allergies Are:  Allergies  Allergen Reactions  . Amoxicillin-Pot Clavulanate   . Atenolol   . Sulfonamide Derivatives   :   Her Current Medications Are:  Outpatient Encounter Prescriptions as of 05/15/2013  Medication Sig  . Acetaminophen (APAP 500 PO) Take 1 tablet by mouth every 6 (six) hours as needed.  Marland Kitchen aspirin 81 MG tablet Take 81 mg by mouth daily.  . Biotin (PA BIOTIN) 1000 MCG tablet Take 1,000 mcg by mouth daily.  Marland Kitchen buPROPion (WELLBUTRIN XL) 150 MG 24 hr tablet Take 300 mg by mouth daily.   . Cholecalciferol 1000 UNITS tablet Take 1,000 Units by mouth daily.  . clonazePAM (KLONOPIN) 1 MG tablet Take 1 mg by mouth 3 (three) times daily as needed for anxiety.  . cyanocobalamin (,VITAMIN B-12,) 1000 MCG/ML injection Inject 1,000 mcg into the muscle once.  . cyclobenzaprine (FLEXERIL) 10 MG tablet Take 10 mg by mouth 3 (three) times daily as needed for muscle spasms.  Marland Kitchen dexlansoprazole (DEXILANT) 60 MG capsule Take 60 mg by mouth daily.  Marland Kitchen diltiazem (CARDIZEM CD) 180 MG 24 hr capsule Take 180 mg by mouth daily.  Marland Kitchen ESTRACE VAGINAL 0.1 MG/GM vaginal cream   . folic acid (FOLVITE) 1 MG tablet Take 1 mg by mouth daily.  . hyoscyamine (LEVSIN, ANASPAZ) 0.125 MG tablet Take 0.125 mg by mouth every 4 (four) hours as needed for cramping.  Marland Kitchen levothyroxine (SYNTHROID, LEVOTHROID) 112 MCG tablet Take 1 tablet (112 mcg total) by mouth daily before  breakfast.  . losartan (COZAAR) 50 MG tablet Take 50 mg by mouth daily.  . metroNIDAZOLE (METROGEL) 0.75 % gel Apply 1 application topically 2 (two) times daily.  . Probiotic Product (PROBIOTIC DAILY) CAPS Take 1 capsule by mouth daily.  . promethazine (PHENERGAN) 25 MG tablet Take 25 mg by mouth every 6 (six) hours as needed for nausea.  Marland Kitchen venlafaxine XR (EFFEXOR-XR) 150 MG 24 hr capsule Take 150 mg by mouth daily.  Marland Kitchen acetaminophen-codeine (TYLENOL #3) 300-30 MG per tablet Take 1 tablet by mouth every 6 (six) hours as needed.  :  Review of Systems:  Out of a complete 14 point review of systems, all are reviewed and negative with the exception of these symptoms as listed below:  Review of Systems  Constitutional: Positive for chills and fatigue.  HENT: Positive  for ear pain.   Eyes: Positive for pain.  Respiratory: Negative.   Cardiovascular: Negative.   Gastrointestinal: Negative.   Endocrine: Positive for cold intolerance.       Flushing   Genitourinary: Negative.   Musculoskeletal: Positive for neck stiffness.  Skin: Negative.   Allergic/Immunologic: Negative.   Neurological: Positive for dizziness, speech difficulty, weakness and headaches.       Memory loss  Hematological: Bruises/bleeds easily.  Psychiatric/Behavioral: Positive for confusion, dysphoric mood, decreased concentration and agitation. The patient is nervous/anxious.     Objective:  Neurologic Exam  Physical Exam Physical Examination:   Filed Vitals:   05/15/13 1414  BP: 138/75  Pulse: 75    General Examination: The patient is a very pleasant 68 y.o. female in no acute distress. She appears well-developed and well-nourished and very well groomed. She is overweight.   HEENT: Normocephalic, atraumatic, pupils are equal, round and reactive to light and accommodation. Funduscopic exam is normal with sharp disc margins noted. Extraocular tracking is good without limitation to gaze excursion or nystagmus noted.  Normal smooth pursuit is noted. Hearing is grossly intact. Tympanic membranes are clear bilaterally. Face is symmetric with normal facial animation and normal facial sensation. Speech is clear with no dysarthria noted. There is no hypophonia. There is no lip, neck/head, jaw or voice tremor. Neck is supple with full range of passive and active motion. There are no carotid bruits on auscultation. Oropharynx exam reveals: mild mouth dryness, adequate dental hygiene and moderate airway crowding, due to floppy soft palate. Mallampati is class II. Tongue protrudes centrally and palate elevates symmetrically. Tonsils are absent.   Chest: Clear to auscultation without wheezing, rhonchi or crackles noted.  Heart: S1+S2+0, regular and normal without murmurs, rubs or gallops noted.   Abdomen: Soft, non-tender and non-distended with normal bowel sounds appreciated on auscultation.  Extremities: There is no pitting edema in the distal lower extremities bilaterally. Pedal pulses are intact.  Skin: Warm and dry without trophic changes noted. There are no varicose veins.  Musculoskeletal: exam reveals no obvious joint deformities, tenderness or joint swelling or erythema.   Neurologically:  Mental status: The patient is awake, alert and oriented in all 4 spheres. Her memory, attention, language and knowledge are appropriate. There is no aphasia, agnosia, apraxia or anomia. Speech is clear with normal prosody and enunciation. Thought process is linear. Mood fairly normal and affect is slightly blunted.  Cranial nerves are as described above under HEENT exam. In addition, shoulder shrug is normal with equal shoulder height noted. Motor exam: Normal bulk, strength and tone is noted. There is no drift, tremor or rebound. Romberg is negative except for mild sway. Reflexes are 2+ throughout. Toes are downgoing bilaterally. Fine motor skills are intact with normal finger taps, normal hand movements, normal rapid  alternating patting, normal foot taps and normal foot agility.  Cerebellar testing shows no dysmetria or intention tremor on finger to nose testing. There is no truncal or gait ataxia.  Sensory exam is intact to light touch, pinprick, vibration, temperature sense in the upper and lower extremities.  Gait, station and balance: she stands up with no difficulty and walks slightly insecurely. No veering to one side is noted. No leaning to one side is noted. Posture is age-appropriate and stance is narrow based. No problems turning are noted other than slight insecurity. She turns en bloc. Intact toe and heel stance is noted.  Assessment and Plan:   In summary, [name] is a very pleasant 68 year old female with an underlying medical history of thyroid disease (Hashimoto's, s/p thyroidectomy), arthritis in her neck and knees, IBS, hypertension, depression, anxiety, paroxysmal supraventricular tachycardia, vitamin B12 deficiency and confusional episodes, who presents for follow up of her memory issues, fatigue, gait disorder and episodic confusion. She is doing well at this time and her exam is non-focal today and I don't believe, her recent complaints are primarily neurological in nature. She is reassured in that regard. From my end of things I did not suggest any medication changes and suggested to see her on an as needed basis.

## 2013-05-15 NOTE — Patient Instructions (Signed)
I think you are doing well from the neurological standpoint and I can see you on an as needed basis.

## 2013-05-16 NOTE — Sleep Study (Signed)
[place] A. Merlene Laughter, MD     [email or web address]          LOCATION: SLEEP LAB FACILITY: [facility]  PHYSICIAN: Winslow Ederer A. Merlene Laughter, M.D.   DATE OF STUDY: 05/11/2013.  NOCTURNAL POLYSOMNOGRAM   REFERRING PHYSICIAN: Delphina Cahill.  INDICATIONS: This 68 year old presents with snoring, daytime sleepiness and insomnia.  MEDICATIONS:  Prior to Admission medications   Medication Sig Start Date End Date Taking? Authorizing Provider  Acetaminophen (APAP 500 PO) Take 1 tablet by mouth every 6 (six) hours as needed.    Historical Provider, MD  acetaminophen-codeine (TYLENOL #3) 300-30 MG per tablet Take 1 tablet by mouth every 6 (six) hours as needed. 04/18/13   Historical Provider, MD  aspirin 81 MG tablet Take 81 mg by mouth daily.    Historical Provider, MD  Biotin (PA BIOTIN) 1000 MCG tablet Take 1,000 mcg by mouth daily.    Historical Provider, MD  buPROPion (WELLBUTRIN XL) 150 MG 24 hr tablet Take 300 mg by mouth daily.     Historical Provider, MD  Cholecalciferol 1000 UNITS tablet Take 1,000 Units by mouth daily.    Historical Provider, MD  clonazePAM (KLONOPIN) 1 MG tablet Take 1 mg by mouth 3 (three) times daily as needed for anxiety.    Historical Provider, MD  cyanocobalamin (,VITAMIN B-12,) 1000 MCG/ML injection Inject 1,000 mcg into the muscle once.    Historical Provider, MD  cyclobenzaprine (FLEXERIL) 10 MG tablet Take 10 mg by mouth 3 (three) times daily as needed for muscle spasms.    Historical Provider, MD  dexlansoprazole (DEXILANT) 60 MG capsule Take 60 mg by mouth daily.    Historical Provider, MD  diltiazem (CARDIZEM CD) 180 MG 24 hr capsule Take 180 mg by mouth daily.    Historical Provider, MD  ESTRACE VAGINAL 0.1 MG/GM vaginal cream  09/26/12   Historical Provider, MD  folic acid (FOLVITE) 1 MG tablet Take 1 mg by mouth daily.    Historical Provider, MD  hyoscyamine (LEVSIN, ANASPAZ) 0.125 MG tablet Take 0.125 mg by mouth every 4 (four) hours as needed for  cramping.    Historical Provider, MD  levothyroxine (SYNTHROID, LEVOTHROID) 112 MCG tablet Take 1 tablet (112 mcg total) by mouth daily before breakfast. 03/21/13   Philemon Kingdom, MD  losartan (COZAAR) 50 MG tablet Take 50 mg by mouth daily.    Historical Provider, MD  metroNIDAZOLE (METROGEL) 0.75 % gel Apply 1 application topically 2 (two) times daily.    Historical Provider, MD  Probiotic Product (PROBIOTIC DAILY) CAPS Take 1 capsule by mouth daily.    Historical Provider, MD  promethazine (PHENERGAN) 25 MG tablet Take 25 mg by mouth every 6 (six) hours as needed for nausea.    Historical Provider, MD  venlafaxine XR (EFFEXOR-XR) 150 MG 24 hr capsule Take 150 mg by mouth daily.    Historical Provider, MD      EPWORTH SLEEPINESS SCALE: 4.   BMI: 33.   ARCHITECTURAL SUMMARY: Total recording time was 397 minutes. Sleep efficiency 88 %. Sleep latency 7 minutes. REM latency 0 minutes. Stage NI 7 %, N2 78 % and N3 15 % and REM sleep 0 %.    RESPIRATORY DATA:  Baseline oxygen saturation is 97 %. The lowest saturation is 79 %. The diagnostic AHI is 6. The RDI is 8. The REM AHI is 0.  LIMB MOVEMENT SUMMARY: PLM index 64.   ELECTROCARDIOGRAM SUMMARY: Average heart rate is 66 with no significant dysrhythmias observed.   IMPRESSION:  1. Severe periodic limb movement disorder of sleep. Consider dopamine agonist such as Requip or Mirapex. 2. Mild obstructive sleep apnea syndrome not requiring positive pressure treatment.  Thanks for this referral.  Lillee Mooneyhan A. Merlene Laughter, M.D. Diplomat, Tax adviser of Sleep Medicine.

## 2013-05-29 DIAGNOSIS — F333 Major depressive disorder, recurrent, severe with psychotic symptoms: Secondary | ICD-10-CM | POA: Diagnosis not present

## 2013-06-26 DIAGNOSIS — R5381 Other malaise: Secondary | ICD-10-CM | POA: Diagnosis not present

## 2013-06-26 DIAGNOSIS — E039 Hypothyroidism, unspecified: Secondary | ICD-10-CM | POA: Diagnosis not present

## 2013-06-26 DIAGNOSIS — R5383 Other fatigue: Secondary | ICD-10-CM | POA: Diagnosis not present

## 2013-06-26 DIAGNOSIS — D518 Other vitamin B12 deficiency anemias: Secondary | ICD-10-CM | POA: Diagnosis not present

## 2013-07-16 ENCOUNTER — Ambulatory Visit (INDEPENDENT_AMBULATORY_CARE_PROVIDER_SITE_OTHER): Payer: Medicare Other | Admitting: Adult Health

## 2013-07-16 ENCOUNTER — Encounter: Payer: Self-pay | Admitting: Adult Health

## 2013-07-16 VITALS — BP 132/58 | HR 72 | Ht 61.0 in | Wt 173.0 lb

## 2013-07-16 DIAGNOSIS — E785 Hyperlipidemia, unspecified: Secondary | ICD-10-CM | POA: Diagnosis not present

## 2013-07-16 DIAGNOSIS — I10 Essential (primary) hypertension: Secondary | ICD-10-CM

## 2013-07-16 DIAGNOSIS — I471 Supraventricular tachycardia: Secondary | ICD-10-CM

## 2013-07-16 NOTE — Assessment & Plan Note (Signed)
She has occasional elevation in heart rate, but they are nonsustained. She should related to doing mild exertion. She is usually very sedentary because of her overall fatigue. Still getting up and walking from one room to another or doing mild exertion such as carrying laundry does increase her heart rate. She is very sensitive to her heart rate. When she sits down to rest heart rate returns to normal. She denies any chest pain associated with this dizziness, I have given her reassurance, she will continue her current medication regimen as directed. Call me no changes at this time. She is to follow with her primary care physician concerning the chronic fatigue syndrome and Epstein-Barr, with recommendations and treatment modalities per their discretion

## 2013-07-16 NOTE — Patient Instructions (Signed)
Your physician recommends that you schedule a follow-up appointment in: 6 months with Dr McDowell You will receive a reminder letter two months in advance reminding you to call and schedule your appointment. If you don't receive this letter, please contact our office.  Your physician recommends that you continue on your current medications as directed. Please refer to the Current Medication list given to you today.   

## 2013-07-16 NOTE — Assessment & Plan Note (Addendum)
Excellent control of blood pressure. No changes in medication

## 2013-07-16 NOTE — Progress Notes (Signed)
HPI; [name], is a 68 year old female patient of Dr. Domenic Polite her following for ongoing assessment and management of palpitations, history of PSVT, and hypertension. The patient was last seen in the office in January 2015, continued on Cardizem CD she is also being treated by her primary care physician for thyroid replacement therapy and titration.   Last office visit in January she had multiple somatic complaints. No cardiac medications were changed or further testing was recommended at that time. She was planned for a sleep study via her primary care physician.  She comes today with similar and consistent somatic complaints of overall fatigue, weakness, dyspnea on exertion, and occasional palpitations. She has been recently diagnosed with chronic fatigue syndrome Epstein-Barr virus. She is being followed by primary care for this. She is medically compliant. Is also being followed by psychiatrist has increased her dose of Effexor.     Allergies  Allergen Reactions  . Amoxicillin-Pot Clavulanate   . Atenolol   . Sulfonamide Derivatives     Current Outpatient Prescriptions  Medication Sig Dispense Refill  . Acetaminophen (APAP 500 PO) Take 1 tablet by mouth every 6 (six) hours as needed.      Marland Kitchen aspirin 81 MG tablet Take 81 mg by mouth daily.      . Biotin (PA BIOTIN) 1000 MCG tablet Take 1,000 mcg by mouth daily.      Marland Kitchen buPROPion (WELLBUTRIN XL) 150 MG 24 hr tablet Take 300 mg by mouth daily.       . clonazePAM (KLONOPIN) 1 MG tablet Take 1 mg by mouth 3 (three) times daily as needed for anxiety.      . cyanocobalamin (,VITAMIN B-12,) 1000 MCG/ML injection Inject 1,000 mcg into the muscle once.      . cyclobenzaprine (FLEXERIL) 10 MG tablet Take 10 mg by mouth 3 (three) times daily as needed for muscle spasms.      Marland Kitchen dexlansoprazole (DEXILANT) 60 MG capsule Take 60 mg by mouth daily.      Marland Kitchen diltiazem (CARDIZEM CD) 180 MG 24 hr capsule Take 180 mg by mouth daily.      Marland Kitchen ESTRACE  VAGINAL 0.1 MG/GM vaginal cream       . folic acid (FOLVITE) 1 MG tablet Take 1 mg by mouth daily.      . hyoscyamine (LEVSIN, ANASPAZ) 0.125 MG tablet Take 0.125 mg by mouth every 4 (four) hours as needed for cramping.      Marland Kitchen levothyroxine (SYNTHROID, LEVOTHROID) 112 MCG tablet Take 1 tablet (112 mcg total) by mouth daily before breakfast.  90 tablet  1  . losartan (COZAAR) 50 MG tablet Take 50 mg by mouth daily.      . metroNIDAZOLE (METROGEL) 0.75 % gel Apply 1 application topically 2 (two) times daily.      . Probiotic Product (PROBIOTIC DAILY) CAPS Take 1 capsule by mouth daily.      . promethazine (PHENERGAN) 25 MG tablet Take 25 mg by mouth every 6 (six) hours as needed for nausea.      Marland Kitchen venlafaxine XR (EFFEXOR-XR) 150 MG 24 hr capsule Take 150 mg by mouth daily.       No current facility-administered medications for this visit.    Past Medical History  Diagnosis Date  . Essential hypertension, benign   . Hyperlipidemia   . Bipolar disorder   . Hashimoto's thyroiditis   . Allergic rhinitis   . GERD (gastroesophageal reflux disease)   . B12 deficiency   .  History of transient ischemic attack (TIA)     Or possibly migraine as well as right amaurosis fugax and ataxia - Dr. Erling Cruz  . PSVT (paroxysmal supraventricular tachycardia)     Past Surgical History  Procedure Laterality Date  . Laparoscopic cholecystectomy  2010  . Thyroidectomy  2009    DZH:GDJMEQ of systems complete and found to be negative unless listed above  PHYSICAL EXAM BP 132/58  Pulse 72  Ht 5\' 1"  (1.549 m)  Wt 173 lb (78.472 kg)  BMI 32.70 kg/m2 General: Well developed, well nourished, in no acute distress Head: Eyes PERRLA, No xanthomas.   Normal cephalic and atramatic  Lungs: Clear bilaterally to auscultation and percussion. Heart: HRRR S1 S2, without MRG.  Pulses are 2+ & equal.            No carotid bruit. No JVD.  No abdominal bruits. No femoral bruits. Abdomen: Bowel sounds are positive, abdomen  soft and non-tender without masses or                  Hernia's noted. Msk:  Back normal, normal gait. Normal strength and tone for age. Extremities: No clubbing, cyanosis or edema.  DP +1 Neuro: Alert and oriented X 3. Psych:  Good affect, responds appropriately    ASSESSMENT AND PLAN

## 2013-07-16 NOTE — Progress Notes (Deleted)
Name: [name]    DOB: [birth year]  Age: 68 y.o.  MR#: [id number]       PCP:  Delphina Cahill, MD      Insurance: Payor: MEDICARE / Plan: MEDICARE PART B / Product Type: *No Product type* /   CC:    Chief Complaint  Patient presents with  . Hypertension    VS Filed Vitals:   07/16/13 1407  BP: 132/58  Pulse: 72  Height: '5\' 1"'  (1.549 m)  Weight: 173 lb (78.472 kg)    Weights Current Weight  07/16/13 173 lb (78.472 kg)  05/15/13 180 lb (81.647 kg)  05/11/13 180 lb (81.647 kg)    Blood Pressure  BP Readings from Last 3 Encounters:  07/16/13 132/58  05/15/13 138/75  03/16/13 118/62     Admit date:  (Not on file) Last encounter with RMR:  01/31/2013   Allergy Amoxicillin-pot clavulanate; Atenolol; and Sulfonamide derivatives  Current Outpatient Prescriptions  Medication Sig Dispense Refill  . Acetaminophen (APAP 500 PO) Take 1 tablet by mouth every 6 (six) hours as needed.      Marland Kitchen aspirin 81 MG tablet Take 81 mg by mouth daily.      . Biotin (PA BIOTIN) 1000 MCG tablet Take 1,000 mcg by mouth daily.      Marland Kitchen buPROPion (WELLBUTRIN XL) 150 MG 24 hr tablet Take 300 mg by mouth daily.       . clonazePAM (KLONOPIN) 1 MG tablet Take 1 mg by mouth 3 (three) times daily as needed for anxiety.      . cyanocobalamin (,VITAMIN B-12,) 1000 MCG/ML injection Inject 1,000 mcg into the muscle once.      . cyclobenzaprine (FLEXERIL) 10 MG tablet Take 10 mg by mouth 3 (three) times daily as needed for muscle spasms.      Marland Kitchen dexlansoprazole (DEXILANT) 60 MG capsule Take 60 mg by mouth daily.      Marland Kitchen diltiazem (CARDIZEM CD) 180 MG 24 hr capsule Take 180 mg by mouth daily.      Marland Kitchen ESTRACE VAGINAL 0.1 MG/GM vaginal cream       . folic acid (FOLVITE) 1 MG tablet Take 1 mg by mouth daily.      . hyoscyamine (LEVSIN, ANASPAZ) 0.125 MG tablet Take 0.125 mg by mouth every 4 (four) hours as needed for cramping.      Marland Kitchen levothyroxine (SYNTHROID, LEVOTHROID) 112 MCG tablet Take 1 tablet (112 mcg total) by  mouth daily before breakfast.  90 tablet  1  . losartan (COZAAR) 50 MG tablet Take 50 mg by mouth daily.      . metroNIDAZOLE (METROGEL) 0.75 % gel Apply 1 application topically 2 (two) times daily.      . Probiotic Product (PROBIOTIC DAILY) CAPS Take 1 capsule by mouth daily.      . promethazine (PHENERGAN) 25 MG tablet Take 25 mg by mouth every 6 (six) hours as needed for nausea.      Marland Kitchen venlafaxine XR (EFFEXOR-XR) 150 MG 24 hr capsule Take 150 mg by mouth daily.       No current facility-administered medications for this visit.    Discontinued Meds:    Medications Discontinued During This Encounter  Medication Reason  . acetaminophen-codeine (TYLENOL #3) 300-30 MG per tablet Error  . Cholecalciferol 1000 UNITS tablet Error    Patient Active Problem List   Diagnosis Date Noted  . Hashimoto's thyroiditis 12/08/2012  . PSVT (paroxysmal supraventricular tachycardia) 03/12/2011  . HYPERLIPIDEMIA 11/06/2009  .  ESSENTIAL HYPERTENSION, BENIGN 11/06/2009  . PALPITATIONS 11/06/2009    LABS    Component Value Date/Time   NA 136 09/17/2011 1700   NA 137 06/02/2008 0425   NA 138 05/31/2008 1816   K 4.7 09/17/2011 1700   K 3.7 06/02/2008 0425   K 3.6 05/31/2008 1816   CL 98 09/17/2011 1700   CL 105 06/02/2008 0425   CL 105 05/31/2008 1816   CO2 28 09/17/2011 1700   CO2 28 06/02/2008 0425   CO2 27 05/31/2008 1816   GLUCOSE 73 09/17/2011 1700   GLUCOSE 120* 06/02/2008 0425   GLUCOSE 105* 05/31/2008 1816   BUN 7 09/17/2011 1700   BUN 3* 06/02/2008 0425   BUN 3* 05/31/2008 1816   CREATININE 0.65 09/17/2011 1700   CREATININE 0.70 06/02/2008 0425   CREATININE 0.74 05/31/2008 1816   CREATININE 0.73 05/28/2008 1400   CALCIUM 9.8 09/17/2011 1700   CALCIUM 8.6 06/02/2008 0425   CALCIUM 8.5 05/31/2008 1816   GFRNONAA >60 06/02/2008 0425   GFRNONAA >60 05/31/2008 1816   GFRNONAA >60 05/28/2008 1400   GFRAA  Value: >60        The eGFR has been calculated using the MDRD equation. This calculation has not been validated in all  clinical situations. eGFR's persistently <60 mL/min signify possible Chronic Kidney Disease. 06/02/2008 0425   GFRAA  Value: >60        The eGFR has been calculated using the MDRD equation. This calculation has not been validated in all clinical situations. eGFR's persistently <60 mL/min signify possible Chronic Kidney Disease. 05/31/2008 1816   GFRAA  Value: >60        The eGFR has been calculated using the MDRD equation. This calculation has not been validated in all clinical situations. eGFR's persistently <60 mL/min signify possible Chronic Kidney Disease. 05/28/2008 1400   CMP     Component Value Date/Time   NA 136 09/17/2011 1700   K 4.7 09/17/2011 1700   CL 98 09/17/2011 1700   CO2 28 09/17/2011 1700   GLUCOSE 73 09/17/2011 1700   BUN 7 09/17/2011 1700   CREATININE 0.65 09/17/2011 1700   CREATININE 0.70 06/02/2008 0425   CALCIUM 9.8 09/17/2011 1700   PROT 7.2 09/17/2011 1700   ALBUMIN 4.5 09/17/2011 1700   AST 16 09/17/2011 1700   ALT 11 09/17/2011 1700   ALKPHOS 74 09/17/2011 1700   BILITOT 0.3 09/17/2011 1700   GFRNONAA >60 06/02/2008 0425   GFRAA  Value: >60        The eGFR has been calculated using the MDRD equation. This calculation has not been validated in all clinical situations. eGFR's persistently <60 mL/min signify possible Chronic Kidney Disease. 06/02/2008 0425       Component Value Date/Time   WBC 7.6 09/17/2011 1700   WBC 17.1* 06/02/2008 0425   WBC 16.7* 06/01/2008 0350   HGB 12.7 09/17/2011 1700   HGB 11.5* 06/02/2008 0425   HGB 12.0 06/01/2008 0350   HCT 37.2 09/17/2011 1700   HCT 32.9* 06/02/2008 0425   HCT 33.8* 06/01/2008 0350   MCV 89.0 09/17/2011 1700   MCV 90.7 06/02/2008 0425   MCV 88.3 06/01/2008 0350    Lipid Panel  No results found for this basename: chol, trig, hdl, cholhdl, vldl, ldlcalc    ABG No results found for this basename: phart, pco2, pco2art, po2, po2art, hco3, tco2, acidbasedef, o2sat     Lab Results  Component Value Date   TSH 0.38 03/16/2013  BNP (last 3  results) No results found for this basename: PROBNP,  in the last 8760 hours Cardiac Panel (last 3 results) No results found for this basename: CKTOTAL, CKMB, TROPONINI, RELINDX,  in the last 72 hours  Iron/TIBC/Ferritin No results found for this basename: iron, tibc, ferritin     EKG Orders placed in visit on 01/31/13  . EKG 12-LEAD     Prior Assessment and Plan Problem List as of 07/16/2013     Cardiovascular and Mediastinum   ESSENTIAL HYPERTENSION, BENIGN   Last Assessment & Plan   01/31/2013 Office Visit Written 01/31/2013  4:51 PM by Lendon Colonel, NP     Blood pressure is well controlled. No changes in medications.   She is requesting a sleep study, due to daytime somulance. She could not make up her mind, but now has decided to wait to see PCP for plans for this study.    PSVT (paroxysmal supraventricular tachycardia)   Last Assessment & Plan   01/31/2013 Office Visit Written 01/31/2013  4:49 PM by Lendon Colonel, NP     She continues to have occasional palpitations, but not as much as before. She has stopped caffeine. She is to see PCP for TSH and other labs in 2 weeks.       Endocrine   Hashimoto's thyroiditis     Other   HYPERLIPIDEMIA   Last Assessment & Plan   01/31/2013 Office Visit Written 01/31/2013  4:51 PM by Lendon Colonel, NP     She will have fasting labs drawn with PCP.     PALPITATIONS   Last Assessment & Plan   02/05/2011 Office Visit Written 02/05/2011 12:32 PM by Satira Sark, MD     Still could be that she is experiencing symptomatic ectopy, although with documentation of possible PSVT as well. It is not however clear that her symptoms are completely explained by this, particularly interesting to note that she did not have any major symptoms while wearing her monitor. We discussed this, and will begin a trial of low-dose atenolol 25 mg in the evening. She will let us know how she tolerates this.        Imaging: No results  found.

## 2013-07-16 NOTE — Assessment & Plan Note (Signed)
Labs for primary care. She sees him often.

## 2013-08-02 DIAGNOSIS — K589 Irritable bowel syndrome without diarrhea: Secondary | ICD-10-CM | POA: Diagnosis not present

## 2013-08-02 DIAGNOSIS — R5381 Other malaise: Secondary | ICD-10-CM | POA: Diagnosis not present

## 2013-08-02 DIAGNOSIS — H9209 Otalgia, unspecified ear: Secondary | ICD-10-CM | POA: Diagnosis not present

## 2013-08-02 DIAGNOSIS — I10 Essential (primary) hypertension: Secondary | ICD-10-CM | POA: Diagnosis not present

## 2013-09-13 ENCOUNTER — Ambulatory Visit: Payer: Medicare Other | Admitting: Internal Medicine

## 2013-09-14 DIAGNOSIS — R05 Cough: Secondary | ICD-10-CM | POA: Diagnosis not present

## 2013-09-14 DIAGNOSIS — R059 Cough, unspecified: Secondary | ICD-10-CM | POA: Diagnosis not present

## 2013-09-14 DIAGNOSIS — H9209 Otalgia, unspecified ear: Secondary | ICD-10-CM | POA: Diagnosis not present

## 2013-09-14 DIAGNOSIS — K219 Gastro-esophageal reflux disease without esophagitis: Secondary | ICD-10-CM | POA: Diagnosis not present

## 2013-09-14 DIAGNOSIS — R498 Other voice and resonance disorders: Secondary | ICD-10-CM | POA: Diagnosis not present

## 2013-09-14 DIAGNOSIS — R070 Pain in throat: Secondary | ICD-10-CM | POA: Diagnosis not present

## 2013-09-17 ENCOUNTER — Other Ambulatory Visit: Payer: Self-pay | Admitting: Cardiology

## 2013-10-09 DIAGNOSIS — I10 Essential (primary) hypertension: Secondary | ICD-10-CM | POA: Diagnosis not present

## 2013-11-12 DIAGNOSIS — Z124 Encounter for screening for malignant neoplasm of cervix: Secondary | ICD-10-CM | POA: Diagnosis not present

## 2013-11-12 DIAGNOSIS — Z01419 Encounter for gynecological examination (general) (routine) without abnormal findings: Secondary | ICD-10-CM | POA: Diagnosis not present

## 2013-11-12 DIAGNOSIS — R1031 Right lower quadrant pain: Secondary | ICD-10-CM | POA: Diagnosis not present

## 2013-11-12 DIAGNOSIS — Z780 Asymptomatic menopausal state: Secondary | ICD-10-CM | POA: Diagnosis not present

## 2013-11-27 DIAGNOSIS — F3181 Bipolar II disorder: Secondary | ICD-10-CM | POA: Diagnosis not present

## 2013-11-29 DIAGNOSIS — E039 Hypothyroidism, unspecified: Secondary | ICD-10-CM | POA: Diagnosis not present

## 2013-11-29 DIAGNOSIS — D519 Vitamin B12 deficiency anemia, unspecified: Secondary | ICD-10-CM | POA: Diagnosis not present

## 2013-11-29 DIAGNOSIS — D6949 Other primary thrombocytopenia: Secondary | ICD-10-CM | POA: Diagnosis not present

## 2013-11-29 DIAGNOSIS — D509 Iron deficiency anemia, unspecified: Secondary | ICD-10-CM | POA: Diagnosis not present

## 2013-12-03 DIAGNOSIS — D509 Iron deficiency anemia, unspecified: Secondary | ICD-10-CM | POA: Diagnosis not present

## 2013-12-03 DIAGNOSIS — D6949 Other primary thrombocytopenia: Secondary | ICD-10-CM | POA: Diagnosis not present

## 2013-12-03 DIAGNOSIS — Z23 Encounter for immunization: Secondary | ICD-10-CM | POA: Diagnosis not present

## 2013-12-03 DIAGNOSIS — E039 Hypothyroidism, unspecified: Secondary | ICD-10-CM | POA: Diagnosis not present

## 2014-01-11 ENCOUNTER — Encounter: Payer: Self-pay | Admitting: Cardiology

## 2014-01-11 ENCOUNTER — Encounter: Payer: Medicare Other | Admitting: Cardiology

## 2014-01-11 NOTE — Progress Notes (Signed)
Patient canceled.  This encounter was created in error - please disregard. 

## 2014-02-06 ENCOUNTER — Ambulatory Visit: Payer: Medicare Other | Admitting: Neurology

## 2014-02-19 ENCOUNTER — Encounter: Payer: Self-pay | Admitting: Neurology

## 2014-02-19 ENCOUNTER — Ambulatory Visit (INDEPENDENT_AMBULATORY_CARE_PROVIDER_SITE_OTHER): Payer: Medicare Other | Admitting: Neurology

## 2014-02-19 VITALS — BP 146/70 | HR 70 | Temp 97.7°F | Resp 18 | Ht 61.0 in | Wt 186.3 lb

## 2014-02-19 DIAGNOSIS — G43109 Migraine with aura, not intractable, without status migrainosus: Secondary | ICD-10-CM | POA: Diagnosis not present

## 2014-02-19 DIAGNOSIS — R270 Ataxia, unspecified: Secondary | ICD-10-CM | POA: Diagnosis not present

## 2014-02-19 DIAGNOSIS — R413 Other amnesia: Secondary | ICD-10-CM

## 2014-02-19 DIAGNOSIS — R404 Transient alteration of awareness: Secondary | ICD-10-CM

## 2014-02-19 NOTE — Patient Instructions (Addendum)
1.  Follow the sleep instructions 2.  To evaluate memory, we will refer you for neuropsychological testing. You have been referred to Dr Nathanial Rancher for Neuro Psych testing. They will call you directly to schedule an appointment. Please call [phone] if you do not hear from them.  3.  To evaluate the episodes of blackouts, we will get an EEG 4.  Increase exercise and improve diet.  Drink plenty of water. 5.  Follow up in 4 months.

## 2014-02-19 NOTE — Progress Notes (Signed)
NEUROLOGY CONSULTATION NOTE  [name] MRN: [id number] DOB: Jul 11, 1945  Referring provider: Dr. Nevada Crane Primary care provider: Dr. Nevada Crane  Reason for consult:  Memory problems, ataxia, transient altered awareness  HISTORY OF PRESENT ILLNESS: [name] is a 69 year old right-handed woman with hypertension, hyperlipidemia, Bipolar disorder, Hashimoto's thyroiditis, GERD, PSVT and history of TIA versus migraine who presents for episodic confusion and ataxia.  Records, labs, and MRI of the brain from March 2011 reviewed.  She was previously followed at [facility] by Dr. Morene Antu and Dr. Star Age, but presents for transfer of care.    She reports that most of these symptoms started last August, however she was followed by neurology for several years for these same issues.  1.  Episodic confusion:  She will have brief periods of blackouts, which she cannot recall what just happened.  For example, she will be on the phone with somebody and the next thing she does not remember ending the conversation and hanging up the phone.  Another time, she would be sitting in a chair and the next thing she recalls, she is standing on the other side of the room..    2.  Gait abnormalities:  She has had gait issues for at least 40 years.  When she walks, she feels unsteady with loss of balance.  It is not associated with dizziness or vertigo.  It has improved over the years.  She no longer has frequent falls.    MRI of the brain and cervical spine revealed no intracranial abnormalities or cervical stenosis.  She also had normal VEP and BAER.  She had an MRI of the brain performed in March 2011, which showed small vessel ischemic changes.  3.  In February 2010, she had episode of vision loss in the right eye, lasting 1 minute.  Carotid doppler from March 2010 was normal.  She was started on aspirin for possible amaurosis fugax.    4.  Migraines:  She has had them for about 2  years.  She reports sharp burning pain over the right eye which radiates to her crown, associated with ice-pick pain in the right ear radiating down the neck.  It is associated with feeling of right-sided facial fullness.  She reports a visual aura of seeing different colors, as well as a scotoma which may last for prolonged period of time.  There is photophobia and phonophobia, but no nausea.  It usually lasts 30 to 60 minutes.  It occurs about once a week.  She doesn't take any medication for it.  5.  Memory problems:  Chronic problem.  She repeats questions.  She requires having to write everything down.  She usually sets up her husband's medications and said she rarely has done it incorrectly.  She only drives locally in [place] because she does not stay focus.  She does not get disoriented on the road.  6.  Since has history of recurrent syncope for over 40 years.  EEG from October 2013 was normal.  A holter monitor revealed paroxysmal supraventricular tachycardia.  She is taking a beta blocker.  7.  She has hypersomnolence.  She had a sleep study in April 2015, which showed only mild OSA, but revealed periodic limb movements of sleep.  Thyroid studies were unremarkable.  She was subsequently diagnosed with chronic fatigue syndrome.  MRI of the brain and cervical spine from several years ago revealed no intracranial abnormalities or cervical stenosis.  She also had  normal VEP and BAER.  She had an MRI of the brain performed in March 2011, which showed small vessel ischemic changes.  She sees a psychiatrist, Dr. Reece Levy for what she says is Bipolar disorder.  She takes Wellbutrin and Cymbalta.  She was previously on Effexor. PAST MEDICAL HISTORY: Past Medical History  Diagnosis Date  . Essential hypertension, benign   . Hyperlipidemia   . Bipolar disorder   . Hashimoto's thyroiditis   . Allergic rhinitis   . GERD (gastroesophageal reflux disease)   . B12 deficiency   . History of transient  ischemic attack (TIA)     Or possibly migraine as well as right amaurosis fugax and ataxia - Dr. Erling Cruz  . PSVT (paroxysmal supraventricular tachycardia)     PAST SURGICAL HISTORY: Past Surgical History  Procedure Laterality Date  . Laparoscopic cholecystectomy  2010  . Thyroidectomy  2009    MEDICATIONS: Current Outpatient Prescriptions on File Prior to Visit  Medication Sig Dispense Refill  . Acetaminophen (APAP 500 PO) Take 1 tablet by mouth every 6 (six) hours as needed.    Marland Kitchen aspirin 81 MG tablet Take 81 mg by mouth daily.    . Biotin (PA BIOTIN) 1000 MCG tablet Take 1,000 mcg by mouth daily.    . clonazePAM (KLONOPIN) 1 MG tablet Take 1 mg by mouth 3 (three) times daily as needed for anxiety.    . cyanocobalamin (,VITAMIN B-12,) 1000 MCG/ML injection Inject 1,000 mcg into the muscle once.    . cyclobenzaprine (FLEXERIL) 10 MG tablet Take 10 mg by mouth 3 (three) times daily as needed for muscle spasms.    Marland Kitchen dexlansoprazole (DEXILANT) 60 MG capsule Take 60 mg by mouth daily.    Marland Kitchen diltiazem (CARDIZEM CD) 180 MG 24 hr capsule TAKE (1) CAPSULE BY MOUTH ONCE DAILY. 30 capsule 11  . ESTRACE VAGINAL 0.1 MG/GM vaginal cream     . folic acid (FOLVITE) 1 MG tablet Take 1 mg by mouth daily.    . hyoscyamine (LEVSIN, ANASPAZ) 0.125 MG tablet Take 0.125 mg by mouth every 4 (four) hours as needed for cramping.    Marland Kitchen levothyroxine (SYNTHROID, LEVOTHROID) 112 MCG tablet Take 1 tablet (112 mcg total) by mouth daily before breakfast. 90 tablet 1  . losartan (COZAAR) 50 MG tablet Take 50 mg by mouth daily.    . metroNIDAZOLE (METROGEL) 0.75 % gel Apply 1 application topically 2 (two) times daily.    . Probiotic Product (PROBIOTIC DAILY) CAPS Take 1 capsule by mouth daily.    . promethazine (PHENERGAN) 25 MG tablet Take 25 mg by mouth every 6 (six) hours as needed for nausea.    Marland Kitchen buPROPion (WELLBUTRIN XL) 150 MG 24 hr tablet Take 300 mg by mouth daily.     Marland Kitchen venlafaxine XR (EFFEXOR-XR) 150 MG 24 hr  capsule Take 150 mg by mouth daily.     No current facility-administered medications on file prior to visit.    ALLERGIES: Allergies  Allergen Reactions  . Amoxicillin-Pot Clavulanate   . Atenolol   . Sulfonamide Derivatives     FAMILY HISTORY: Family History  Problem Relation Age of Onset  . Depression Father   . Stroke Mother   . Thyroid disease Mother   . Hypertension Sister   . Aneurysm Sister   . Diabetes Brother   . Heart attack Brother   . Hypertension Brother   . Thyroid disease Brother   . Stroke Maternal Grandmother   . Heart failure Maternal Grandmother   .  Heart failure Maternal Grandfather   . Stroke Maternal Grandfather   . Heart failure Paternal Grandmother     SOCIAL HISTORY: History   Social History  . Marital Status: Married    Spouse Name: N/A    Number of Children: 2  . Years of Education: N/A   Occupational History  . homemaker    Social History Main Topics  . Smoking status: Former Smoker -- 0.50 packs/day for 10 years    Types: Cigarettes    Quit date: 01/26/1983  . Smokeless tobacco: Never Used     Comment: quit 30 years ago  . Alcohol Use: No  . Drug Use: No  . Sexual Activity: No   Other Topics Concern  . Not on file   Social History Narrative   Regular exercise: walk when able   Caffeine use: occasionally    REVIEW OF SYSTEMS: Constitutional: No fevers, chills, or sweats, no generalized fatigue, change in appetite Eyes: No visual changes, double vision, eye pain Ear, nose and throat: No hearing loss, ear pain, nasal congestion, sore throat Cardiovascular: No chest pain, palpitations Respiratory:  No shortness of breath at rest or with exertion, wheezes GastrointestinaI: No nausea, vomiting, diarrhea, abdominal pain, fecal incontinence Genitourinary:  No dysuria, urinary retention or frequency Musculoskeletal:  No neck pain, back pain Integumentary: rosacea Neurological: as above Psychiatric: Depression Endocrine: No  palpitations, fatigue, diaphoresis, mood swings, change in appetite, change in weight, increased thirst Hematologic/Lymphatic:  No anemia, purpura, petechiae. Allergic/Immunologic: no itchy/runny eyes, nasal congestion, recent allergic reactions, rashes  PHYSICAL EXAM: Filed Vitals:   02/19/14 1241  BP: 146/70  Pulse: 70  Temp: 97.7 F (36.5 C)  Resp: 18   General: No acute distress Head:  Normocephalic/atraumatic Eyes:  fundi unremarkable, without vessel changes, exudates, hemorrhages or papilledema. Neck: supple, no paraspinal tenderness, full range of motion Back: No paraspinal tenderness Heart: regular rate and rhythm Lungs: Clear to auscultation bilaterally. Vascular: No carotid bruits. Neurological Exam: Mental status: alert and oriented to person, place, and time, remote memory intact, fund of knowledge intact, attention and concentration adequate, speech fluent and not dysarthric, language intact. Montreal Cognitive Assessment  02/19/2014  Visuospatial/ Executive (0/5) 2  Naming (0/3) 2  Attention: Read list of digits (0/2) 2  Attention: Read list of letters (0/1) 1  Attention: Serial 7 subtraction starting at 100 (0/3) 2  Language: Repeat phrase (0/2) 2  Language : Fluency (0/1) 1  Abstraction (0/2) 1  Delayed Recall (0/5) 2  Orientation (0/6) 5  Total 20  Adjusted Score (based on education) 21   Cranial nerves: CN I: not tested CN II: pupils equal, round and reactive to light, visual fields intact, fundi unremarkable, without vessel changes, exudates, hemorrhages or papilledema. CN III, IV, VI:  full range of motion, no nystagmus, no ptosis CN V: facial sensation intact CN VII: upper and lower face symmetric CN VIII: hearing intact CN IX, X: gag intact, uvula midline CN XI: sternocleidomastoid and trapezius muscles intact CN XII: tongue midline Bulk & Tone: normal, no fasciculations. Motor:  5/5 throughout Sensation:  Pinprick and vibration intact Deep  Tendon Reflexes:  2+ throughout except 3+ in the patellars and symmetric.  Toes downgoing. Finger to nose testing:  No dysmetria Heel to shin:  No dysmetria Gait:  Mildly wide-based gait.  Able to turn around.  Able to walk on toes and heels.  Unable to walk in tandem. Romberg with sway.  IMPRESSION: Recurrent episodes of brief altered awareness in which  she blacks out for a couple of minutes. Migraine with aura Gait instability.  The loss of balance when trying to tandem walk did not look physiologic. Memory problems  PLAN: 1.  Refer for neuropsychological testing 2.  Will get another EEG 3.  Discussed sleep hygiene, diet and exercise 4.  Follow up in 4 months.  45 minutes spent with patient, over 50% spent discussing management and plan.  Thank you for allowing me to take part in the care of this patient.  Metta Clines, DO  CC: Delphina Cahill, MD

## 2014-02-25 ENCOUNTER — Ambulatory Visit (INDEPENDENT_AMBULATORY_CARE_PROVIDER_SITE_OTHER): Payer: Medicare Other | Admitting: Cardiology

## 2014-02-25 ENCOUNTER — Encounter: Payer: Self-pay | Admitting: Cardiology

## 2014-02-25 VITALS — BP 128/82 | HR 72 | Ht 61.5 in | Wt 185.0 lb

## 2014-02-25 DIAGNOSIS — I471 Supraventricular tachycardia: Secondary | ICD-10-CM | POA: Diagnosis not present

## 2014-02-25 NOTE — Progress Notes (Signed)
Reason for visit: PSVT  Clinical Summary [name] is a 69 y.o.female last seen in June 2015 by Ms. Lawrence NP. She presents for a routine visit today, overall seems to be doing fairly well in terms of palpitations. She continues on Cardizem CD at 180 mg daily, also aspirin. She reports chronic shortness of breath, just got back to her walking regimen. She is also limited by knee arthritis. She has had no exertional chest pain or syncope.   Allergies  Allergen Reactions  . Amoxicillin-Pot Clavulanate   . Atenolol   . Sulfonamide Derivatives     Current Outpatient Prescriptions  Medication Sig Dispense Refill  . Acetaminophen (APAP 500 PO) Take 1 tablet by mouth every 6 (six) hours as needed.    Marland Kitchen aspirin 81 MG tablet Take 81 mg by mouth daily.    . Biotin (PA BIOTIN) 1000 MCG tablet Take 1,000 mcg by mouth daily.    Marland Kitchen buPROPion (WELLBUTRIN XL) 150 MG 24 hr tablet Take 300 mg by mouth daily.     Marland Kitchen buPROPion (WELLBUTRIN XL) 300 MG 24 hr tablet     . clonazePAM (KLONOPIN) 1 MG tablet Take 1 mg by mouth 3 (three) times daily as needed for anxiety.    . cyanocobalamin (,VITAMIN B-12,) 1000 MCG/ML injection Inject 1,000 mcg into the muscle once.    Marland Kitchen dexlansoprazole (DEXILANT) 60 MG capsule Take 60 mg by mouth daily.    Marland Kitchen diltiazem (CARDIZEM CD) 180 MG 24 hr capsule TAKE (1) CAPSULE BY MOUTH ONCE DAILY. 30 capsule 11  . DULoxetine (CYMBALTA) 60 MG capsule     . ESTRACE VAGINAL 0.1 MG/GM vaginal cream     . folic acid (FOLVITE) 1 MG tablet Take 1 mg by mouth daily.    . hyoscyamine (LEVSIN, ANASPAZ) 0.125 MG tablet Take 0.125 mg by mouth every 4 (four) hours as needed for cramping.    . Iron-FA-B Cmp-C-Biot-Probiotic (FUSION PLUS PO) Take by mouth daily.    Marland Kitchen levothyroxine (SYNTHROID, LEVOTHROID) 112 MCG tablet Take 1 tablet (112 mcg total) by mouth daily before breakfast. 90 tablet 1  . losartan (COZAAR) 50 MG tablet Take 50 mg by mouth daily.    . metroNIDAZOLE (METROGEL) 0.75 %  gel Apply 1 application topically 2 (two) times daily.    . Probiotic Product (PROBIOTIC DAILY) CAPS Take 1 capsule by mouth daily.    . promethazine (PHENERGAN) 25 MG tablet Take 25 mg by mouth every 6 (six) hours as needed for nausea.     No current facility-administered medications for this visit.    Past Medical History  Diagnosis Date  . Essential hypertension, benign   . Hyperlipidemia   . Bipolar disorder   . Hashimoto's thyroiditis   . Allergic rhinitis   . GERD (gastroesophageal reflux disease)   . B12 deficiency   . History of transient ischemic attack (TIA)     Or possibly migraine as well as right amaurosis fugax and ataxia - Dr. Erling Cruz  . PSVT (paroxysmal supraventricular tachycardia)     Social History [name] reports that she quit smoking about 31 years ago. Her smoking use included Cigarettes. She has a 5 pack-year smoking history. She has never used smokeless tobacco. [name] reports that she does not drink alcohol.  Review of Systems Complete review of systems negative except as otherwise outlined in the clinical summary and also the following. Arthritic pains in her left hand and arm. No orthopnea or PND.  Physical Examination Filed  Vitals:   02/25/14 1633  BP: 128/82  Pulse: 72    Wt Readings from Last 3 Encounters:  02/25/14 185 lb (83.915 kg)  02/19/14 186 lb 4.8 oz (84.505 kg)  07/16/13 173 lb (78.472 kg)   Appears comfortable at rest.  HEENT: Conjunctiva and lids normal, oropharynx with moist mucosa.  Neck: Supple, no elevated JVP or carotid bruits.  Lungs: Clear to auscultation, nonlabored.  Cardiac: Regular rate and rhythm, soft systolic murmur at the base with preserved second heart sound, no pericardial rub or S3 gallop.  Abdomen: Soft, nontender, bowel sounds present.  Extremities: No pitting edema, distal pulses full.    Problem List and Plan   PSVT (paroxysmal supraventricular tachycardia) Symptomatically controlled on  Cardizem CD 180 mg daily. No changes made today. We will see her back on an annual basis unless symptoms flare up.     [name], M.D., F.A.C.C.

## 2014-02-25 NOTE — Assessment & Plan Note (Signed)
Symptomatically controlled on Cardizem CD 180 mg daily. No changes made today. We will see her back on an annual basis unless symptoms flare up.

## 2014-02-25 NOTE — Patient Instructions (Signed)

## 2014-03-11 ENCOUNTER — Other Ambulatory Visit: Payer: Medicare Other

## 2014-03-11 ENCOUNTER — Telehealth: Payer: Self-pay | Admitting: Neurology

## 2014-03-11 NOTE — Telephone Encounter (Signed)
Pt cancelled today's routine EEG due to weather.  Seth Bake - please call patient to r/s appt / Sherri S.

## 2014-03-12 NOTE — Telephone Encounter (Signed)
appt has been r/s / [facility]

## 2014-03-27 ENCOUNTER — Ambulatory Visit (INDEPENDENT_AMBULATORY_CARE_PROVIDER_SITE_OTHER): Payer: BLUE CROSS/BLUE SHIELD | Admitting: Neurology

## 2014-03-27 DIAGNOSIS — R413 Other amnesia: Secondary | ICD-10-CM

## 2014-03-27 DIAGNOSIS — R270 Ataxia, unspecified: Secondary | ICD-10-CM

## 2014-03-27 DIAGNOSIS — R404 Transient alteration of awareness: Secondary | ICD-10-CM

## 2014-03-27 DIAGNOSIS — G43109 Migraine with aura, not intractable, without status migrainosus: Secondary | ICD-10-CM

## 2014-04-01 DIAGNOSIS — E782 Mixed hyperlipidemia: Secondary | ICD-10-CM | POA: Diagnosis not present

## 2014-04-01 DIAGNOSIS — E039 Hypothyroidism, unspecified: Secondary | ICD-10-CM | POA: Diagnosis not present

## 2014-04-04 ENCOUNTER — Telehealth: Payer: Self-pay | Admitting: *Deleted

## 2014-04-04 NOTE — Telephone Encounter (Signed)
Patient requesting EEG results  C/b [phone]

## 2014-04-04 NOTE — Telephone Encounter (Signed)
error 

## 2014-05-02 NOTE — Procedures (Addendum)
ELECTROENCEPHALOGRAM REPORT  Date of Study: 03/27/2014  Patient's Name: [name] MRN: [id number] Date of Birth: Dec 22, 1945  Indication: Episodes of confusion and blackouts  Medications: Klonopin Synthroid Wellbutrin Cymbalta  Technical Summary: This is a multichannel digital EEG recording, using the international 10-20 placement system.  Spike detection software was employed.  Description: The EEG background is symmetric, with a well-developed posterior dominant rhythm of 9 Hz, which is reactive to eye opening and closing.  Diffuse beta activity is seen, with a bilateral frontal preponderance.  No focal or generalized abnormalities are seen.  No focal or generalized epileptiform discharges are seen.  Stage II sleep is seen, with normal and symmetric sleep patterns.  Hyperventilation and photic stimulation were performed, and produced no abnormalities.  ECG revealed normal cardiac rate and rhythm.  Impression: This is a normal routine EEG of the awake and asleep states, with activating procedures.  A normal study does not rule out the possibility of a seizure disorder in this patient.  Chaquana Nichols R. Tomi Likens, DO

## 2014-05-03 ENCOUNTER — Encounter: Payer: Self-pay | Admitting: Neurology

## 2014-05-03 ENCOUNTER — Ambulatory Visit (INDEPENDENT_AMBULATORY_CARE_PROVIDER_SITE_OTHER): Payer: Medicare Other | Admitting: Neurology

## 2014-05-03 VITALS — BP 130/60 | HR 60 | Ht 61.5 in | Wt 182.0 lb

## 2014-05-03 DIAGNOSIS — F419 Anxiety disorder, unspecified: Secondary | ICD-10-CM

## 2014-05-03 DIAGNOSIS — R2681 Unsteadiness on feet: Secondary | ICD-10-CM

## 2014-05-03 DIAGNOSIS — F418 Other specified anxiety disorders: Secondary | ICD-10-CM | POA: Diagnosis not present

## 2014-05-03 DIAGNOSIS — F329 Major depressive disorder, single episode, unspecified: Secondary | ICD-10-CM

## 2014-05-03 DIAGNOSIS — R404 Transient alteration of awareness: Secondary | ICD-10-CM

## 2014-05-03 DIAGNOSIS — F32A Depression, unspecified: Secondary | ICD-10-CM

## 2014-05-03 DIAGNOSIS — F481 Depersonalization-derealization syndrome: Secondary | ICD-10-CM

## 2014-05-03 DIAGNOSIS — R413 Other amnesia: Secondary | ICD-10-CM | POA: Diagnosis not present
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
## 2014-05-09 ENCOUNTER — Telehealth: Payer: Self-pay | Admitting: Neurology

## 2014-05-09 NOTE — Telephone Encounter (Signed)
Pt needs to talk to someone about her memory. She states that she is not remembering things pt phone number is [phone]

## 2014-05-09 NOTE — Telephone Encounter (Signed)
Lmom to return my call. 

## 2014-05-10 NOTE — Telephone Encounter (Signed)
Pt is returning your call please her at [phone]

## 2014-05-10 NOTE — Telephone Encounter (Signed)
We discussed this at her last session.  I suspect it is related to anxiety and that she should discuss this with Dr. Reece Levy, her psychiatrist.  The only other test I would recommend would be formal neuropsychological testing, but she did not want to do this.

## 2014-05-10 NOTE — Telephone Encounter (Signed)
Patient notified of advisement. She states she will discuss this with Dr. Reece Levy.

## 2014-05-10 NOTE — Telephone Encounter (Signed)
I spoke with [name]. She wants to know what she's supposed to do about her memory issues, she states she doesn't have a clear answer of what next steps or treatment options are. Please advise.

## 2014-05-28 DIAGNOSIS — F3181 Bipolar II disorder: Secondary | ICD-10-CM | POA: Diagnosis not present

## 2014-05-28 DIAGNOSIS — F413 Other mixed anxiety disorders: Secondary | ICD-10-CM | POA: Diagnosis not present

## 2014-06-05 ENCOUNTER — Other Ambulatory Visit: Payer: Medicare Other

## 2014-06-06 ENCOUNTER — Emergency Department (HOSPITAL_COMMUNITY): Payer: No Typology Code available for payment source

## 2014-06-06 ENCOUNTER — Encounter (HOSPITAL_COMMUNITY): Payer: Self-pay | Admitting: *Deleted

## 2014-06-06 ENCOUNTER — Emergency Department (HOSPITAL_COMMUNITY)
Admission: EM | Admit: 2014-06-06 | Discharge: 2014-06-06 | Disposition: A | Discharge status: Home / Self Care | Payer: No Typology Code available for payment source | Attending: Emergency Medicine | Admitting: Emergency Medicine

## 2014-06-06 DIAGNOSIS — Y998 Other external cause status: Secondary | ICD-10-CM | POA: Insufficient documentation

## 2014-06-06 DIAGNOSIS — Y9389 Activity, other specified: Secondary | ICD-10-CM | POA: Diagnosis not present

## 2014-06-06 DIAGNOSIS — Y92410 Unspecified street and highway as the place of occurrence of the external cause: Secondary | ICD-10-CM | POA: Diagnosis not present

## 2014-06-06 DIAGNOSIS — I10 Essential (primary) hypertension: Secondary | ICD-10-CM | POA: Diagnosis not present

## 2014-06-06 DIAGNOSIS — Z7982 Long term (current) use of aspirin: Secondary | ICD-10-CM | POA: Diagnosis not present

## 2014-06-06 DIAGNOSIS — I471 Supraventricular tachycardia: Secondary | ICD-10-CM | POA: Insufficient documentation

## 2014-06-06 DIAGNOSIS — Z8673 Personal history of transient ischemic attack (TIA), and cerebral infarction without residual deficits: Secondary | ICD-10-CM | POA: Diagnosis not present

## 2014-06-06 DIAGNOSIS — F319 Bipolar disorder, unspecified: Secondary | ICD-10-CM | POA: Insufficient documentation

## 2014-06-06 DIAGNOSIS — E538 Deficiency of other specified B group vitamins: Secondary | ICD-10-CM | POA: Insufficient documentation

## 2014-06-06 DIAGNOSIS — Z87891 Personal history of nicotine dependence: Secondary | ICD-10-CM | POA: Diagnosis not present

## 2014-06-06 DIAGNOSIS — E063 Autoimmune thyroiditis: Secondary | ICD-10-CM | POA: Insufficient documentation

## 2014-06-06 DIAGNOSIS — K219 Gastro-esophageal reflux disease without esophagitis: Secondary | ICD-10-CM | POA: Insufficient documentation

## 2014-06-06 DIAGNOSIS — S8002XA Contusion of left knee, initial encounter: Secondary | ICD-10-CM | POA: Diagnosis not present

## 2014-06-06 DIAGNOSIS — Z792 Long term (current) use of antibiotics: Secondary | ICD-10-CM | POA: Diagnosis not present

## 2014-06-06 DIAGNOSIS — Z8709 Personal history of other diseases of the respiratory system: Secondary | ICD-10-CM | POA: Insufficient documentation

## 2014-06-06 DIAGNOSIS — M25562 Pain in left knee: Secondary | ICD-10-CM | POA: Diagnosis not present

## 2014-06-06 DIAGNOSIS — Z79899 Other long term (current) drug therapy: Secondary | ICD-10-CM | POA: Diagnosis not present

## 2014-06-06 DIAGNOSIS — S39012A Strain of muscle, fascia and tendon of lower back, initial encounter: Secondary | ICD-10-CM

## 2014-06-06 DIAGNOSIS — Z880 Allergy status to penicillin: Secondary | ICD-10-CM | POA: Insufficient documentation

## 2014-06-06 DIAGNOSIS — S8992XA Unspecified injury of left lower leg, initial encounter: Secondary | ICD-10-CM | POA: Diagnosis not present

## 2014-06-06 DIAGNOSIS — M545 Low back pain: Secondary | ICD-10-CM | POA: Diagnosis not present

## 2014-06-06 DIAGNOSIS — S3992XA Unspecified injury of lower back, initial encounter: Secondary | ICD-10-CM | POA: Diagnosis not present
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
## 2014-06-26 ENCOUNTER — Ambulatory Visit: Payer: Medicare Other | Admitting: Neurology

## 2014-06-26 DIAGNOSIS — M25511 Pain in right shoulder: Secondary | ICD-10-CM | POA: Diagnosis not present

## 2014-06-26 DIAGNOSIS — S8002XA Contusion of left knee, initial encounter: Secondary | ICD-10-CM | POA: Diagnosis not present

## 2014-06-26 DIAGNOSIS — M1712 Unilateral primary osteoarthritis, left knee: Secondary | ICD-10-CM | POA: Diagnosis not present

## 2014-06-27 DIAGNOSIS — H04129 Dry eye syndrome of unspecified lacrimal gland: Secondary | ICD-10-CM | POA: Diagnosis not present

## 2014-06-27 DIAGNOSIS — H5203 Hypermetropia, bilateral: Secondary | ICD-10-CM | POA: Diagnosis not present

## 2014-06-27 DIAGNOSIS — H43811 Vitreous degeneration, right eye: Secondary | ICD-10-CM | POA: Diagnosis not present

## 2014-06-27 DIAGNOSIS — H52223 Regular astigmatism, bilateral: Secondary | ICD-10-CM | POA: Diagnosis not present

## 2014-07-02 DIAGNOSIS — Z1231 Encounter for screening mammogram for malignant neoplasm of breast: Secondary | ICD-10-CM | POA: Diagnosis not present

## 2014-07-04 ENCOUNTER — Encounter (HOSPITAL_COMMUNITY): Payer: Self-pay | Admitting: Emergency Medicine

## 2014-07-04 ENCOUNTER — Emergency Department (HOSPITAL_COMMUNITY): Payer: Medicare Other

## 2014-07-04 ENCOUNTER — Emergency Department (HOSPITAL_COMMUNITY)
Admission: EM | Admit: 2014-07-04 | Discharge: 2014-07-04 | Disposition: A | Discharge status: Home / Self Care | Payer: Medicare Other | Attending: Emergency Medicine | Admitting: Emergency Medicine

## 2014-07-04 DIAGNOSIS — E538 Deficiency of other specified B group vitamins: Secondary | ICD-10-CM | POA: Insufficient documentation

## 2014-07-04 DIAGNOSIS — Z880 Allergy status to penicillin: Secondary | ICD-10-CM | POA: Insufficient documentation

## 2014-07-04 DIAGNOSIS — K219 Gastro-esophageal reflux disease without esophagitis: Secondary | ICD-10-CM | POA: Diagnosis not present

## 2014-07-04 DIAGNOSIS — I471 Supraventricular tachycardia: Secondary | ICD-10-CM | POA: Insufficient documentation

## 2014-07-04 DIAGNOSIS — Z87891 Personal history of nicotine dependence: Secondary | ICD-10-CM | POA: Insufficient documentation

## 2014-07-04 DIAGNOSIS — Z7982 Long term (current) use of aspirin: Secondary | ICD-10-CM | POA: Diagnosis not present

## 2014-07-04 DIAGNOSIS — R4182 Altered mental status, unspecified: Secondary | ICD-10-CM | POA: Diagnosis not present

## 2014-07-04 DIAGNOSIS — F4489 Other dissociative and conversion disorders: Secondary | ICD-10-CM | POA: Diagnosis not present

## 2014-07-04 DIAGNOSIS — E063 Autoimmune thyroiditis: Secondary | ICD-10-CM | POA: Diagnosis not present

## 2014-07-04 DIAGNOSIS — M25671 Stiffness of right ankle, not elsewhere classified: Secondary | ICD-10-CM | POA: Diagnosis not present

## 2014-07-04 DIAGNOSIS — I10 Essential (primary) hypertension: Secondary | ICD-10-CM | POA: Insufficient documentation

## 2014-07-04 DIAGNOSIS — M25471 Effusion, right ankle: Secondary | ICD-10-CM | POA: Diagnosis not present

## 2014-07-04 DIAGNOSIS — I6789 Other cerebrovascular disease: Secondary | ICD-10-CM | POA: Diagnosis not present

## 2014-07-04 DIAGNOSIS — E860 Dehydration: Secondary | ICD-10-CM | POA: Diagnosis not present

## 2014-07-04 DIAGNOSIS — M6281 Muscle weakness (generalized): Secondary | ICD-10-CM | POA: Diagnosis not present

## 2014-07-04 DIAGNOSIS — Z79899 Other long term (current) drug therapy: Secondary | ICD-10-CM | POA: Insufficient documentation

## 2014-07-04 DIAGNOSIS — Z8673 Personal history of transient ischemic attack (TIA), and cerebral infarction without residual deficits: Secondary | ICD-10-CM | POA: Insufficient documentation

## 2014-07-04 DIAGNOSIS — R410 Disorientation, unspecified: Secondary | ICD-10-CM | POA: Insufficient documentation

## 2014-07-04 DIAGNOSIS — M25571 Pain in right ankle and joints of right foot: Secondary | ICD-10-CM | POA: Diagnosis not present

## 2014-07-04 LAB — DIFFERENTIAL
BASOS ABS: 0 10*3/uL (ref 0.0–0.1)
Basophils Relative: 0 % (ref 0–1)
EOS ABS: 0.3 10*3/uL (ref 0.0–0.7)
EOS PCT: 4 % (ref 0–5)
Lymphocytes Relative: 19 % (ref 12–46)
Lymphs Abs: 1.8 10*3/uL (ref 0.7–4.0)
MONO ABS: 1.1 10*3/uL — AB (ref 0.1–1.0)
MONOS PCT: 11 % (ref 3–12)
NEUTROS PCT: 66 % (ref 43–77)
Neutro Abs: 6.5 10*3/uL (ref 1.7–7.7)

## 2014-07-04 LAB — URINALYSIS, ROUTINE W REFLEX MICROSCOPIC
Bilirubin Urine: NEGATIVE
Glucose, UA: NEGATIVE mg/dL
HGB URINE DIPSTICK: NEGATIVE
KETONES UR: NEGATIVE mg/dL
NITRITE: NEGATIVE
Protein, ur: NEGATIVE mg/dL
Specific Gravity, Urine: 1.01 (ref 1.005–1.030)
Urobilinogen, UA: 0.2 mg/dL (ref 0.0–1.0)
pH: 6 (ref 5.0–8.0)

## 2014-07-04 LAB — CBC
HCT: 37.6 % (ref 36.0–46.0)
Hemoglobin: 12.7 g/dL (ref 12.0–15.0)
MCH: 30.7 pg (ref 26.0–34.0)
MCHC: 33.8 g/dL (ref 30.0–36.0)
MCV: 90.8 fL (ref 78.0–100.0)
PLATELETS: 435 10*3/uL — AB (ref 150–400)
RBC: 4.14 MIL/uL (ref 3.87–5.11)
RDW: 13 % (ref 11.5–15.5)
WBC: 9.8 10*3/uL (ref 4.0–10.5)

## 2014-07-04 LAB — COMPREHENSIVE METABOLIC PANEL
ALBUMIN: 4.1 g/dL (ref 3.5–5.0)
ALK PHOS: 69 U/L (ref 38–126)
ALT: 16 U/L (ref 14–54)
AST: 19 U/L (ref 15–41)
Anion gap: 9 (ref 5–15)
BUN: 8 mg/dL (ref 6–20)
CHLORIDE: 97 mmol/L — AB (ref 101–111)
CO2: 27 mmol/L (ref 22–32)
CREATININE: 0.65 mg/dL (ref 0.44–1.00)
Calcium: 9.2 mg/dL (ref 8.9–10.3)
GFR calc Af Amer: >60 mL/min (ref >60)
GFR calc non Af Amer: >60 mL/min (ref >60)
GLUCOSE: 90 mg/dL (ref 65–99)
Potassium: 4.7 mmol/L (ref 3.5–5.1)
Sodium: 133 mmol/L — ABNORMAL LOW (ref 135–145)
Total Bilirubin: 0.4 mg/dL (ref 0.3–1.2)
Total Protein: 7.2 g/dL (ref 6.5–8.1)

## 2014-07-04 LAB — URINE MICROSCOPIC-ADD ON

## 2014-07-04 LAB — APTT: APTT: 25 s (ref 24–37)

## 2014-07-04 LAB — I-STAT TROPONIN, ED: TROPONIN I, POC: 0.01 ng/mL (ref 0.00–0.08)

## 2014-07-04 LAB — PROTIME-INR
INR: 0.97 (ref 0.00–1.49)
Prothrombin Time: 13.1 seconds (ref 11.6–15.2)

## 2014-07-04 NOTE — ED Notes (Signed)
Per EMS pt PT reports pt was confused and disoriented at therapy today. Reports pt tongue appeared to be deviated to left. Pt alert and oriented x 4 at present, but states she "just doesn't like right, like in a daze". Speech clear. Moves all extremities equally. nad noted.

## 2014-07-04 NOTE — ED Notes (Signed)
Patient taken to CT for scan.
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
## 2014-07-04 NOTE — ED Notes (Signed)
Patient has returned from CT

## 2014-07-04 NOTE — Discharge Instructions (Signed)
Confusion Confusion is the inability to think with your usual speed or clarity. Confusion may come on quickly or slowly over time. How quickly the confusion comes on depends on the cause. Confusion can be due to any number of causes. CAUSES   Concussion, head injury, or head trauma.  Seizures.  Stroke.  Fever.  Brain tumor.  Age related decreased brain function (dementia).  Heightened emotional states like rage or terror.  Mental illness in which the person loses the ability to determine what is real and what is not (hallucinations).  Infections such as a urinary tract infection (UTI).  Toxic effects from alcohol, drugs, or prescription medicines.  Dehydration and an imbalance of salts in the body (electrolytes).  Lack of sleep.  Low blood sugar (diabetes).  Low levels of oxygen from conditions such as chronic lung disorders.  Drug interactions or other medicine side effects.  Nutritional deficiencies, especially niacin, thiamine, vitamin C, or vitamin B.  Sudden drop in body temperature (hypothermia).  Change in routine, such as when traveling or hospitalized. SIGNS AND SYMPTOMS  People often describe their thinking as cloudy or unclear when they are confused. Confusion can also include feeling disoriented. That means you are unaware of where or who you are. You may also not know what the date or time is. If confused, you may also have difficulty paying attention, remembering, and making decisions. Some people also act aggressively when they are confused.  DIAGNOSIS  The medical evaluation of confusion may include:  Blood and urine tests.  X-rays.  Brain and nervous system tests.  Analyzing your brain waves (electroencephalogram or EEG).  Magnetic resonance imaging (MRI) of your head.  Computed tomography (CT) scan of your head.  Mental status tests in which your health care provider may ask many questions. Some of these questions may seem silly or strange,  but they are a very important test to help diagnose and treat confusion. TREATMENT  An admission to the hospital may not be needed, but a person with confusion should not be left alone. Stay with a family member or friend until the confusion clears. Avoid alcohol, pain relievers, or sedative drugs until you have fully recovered. Do not drive until directed by your health care provider. HOME CARE INSTRUCTIONS  What family and friends can do:  To find out if someone is confused, ask the person to state his or her name, age, and the date. If the person is unsure or answers incorrectly, he or she is confused.  Always introduce yourself, no matter how well the person knows you.  Often remind the person of his or her location.  Place a calendar and clock near the confused person.  Help the person with his or her medicines. You may want to use a pill box, an alarm as a reminder, or give the person each dose as prescribed.  Talk about current events and plans for the day.  Try to keep the environment calm, quiet, and peaceful.  Make sure the person keeps follow-up visits with his or her health care provider. PREVENTION  Ways to prevent confusion:  Avoid alcohol.  Eat a balanced diet.  Get enough sleep.  Take medicine only as directed by your health care provider.  Do not become isolated. Spend time with other people and make plans for your days.  Keep careful watch on your blood sugar levels if you are diabetic. SEEK IMMEDIATE MEDICAL CARE IF:   You develop severe headaches, repeated vomiting, seizures, blackouts, or   slurred speech.  There is increasing confusion, weakness, numbness, restlessness, or personality changes.  You develop a loss of balance, have marked dizziness, feel uncoordinated, or fall.  You have delusions, hallucinations, or develop severe anxiety.  Your family members think you need to be rechecked. Document Released: 02/19/2004 Document Revised: 05/28/2013  Document Reviewed: 02/16/2013 Exit[employer] Patient Information 2015 [employer]. This information is not intended to replace advice given to you by your health care provider. Make sure you discuss any questions you have with your health care provider.  

## 2014-07-08 DIAGNOSIS — E039 Hypothyroidism, unspecified: Secondary | ICD-10-CM | POA: Diagnosis not present

## 2014-07-08 DIAGNOSIS — S8000XA Contusion of unspecified knee, initial encounter: Secondary | ICD-10-CM | POA: Diagnosis not present

## 2014-07-08 DIAGNOSIS — R410 Disorientation, unspecified: Secondary | ICD-10-CM | POA: Diagnosis not present

## 2014-07-08 DIAGNOSIS — E871 Hypo-osmolality and hyponatremia: Secondary | ICD-10-CM | POA: Diagnosis not present

## 2014-07-08 DIAGNOSIS — R270 Ataxia, unspecified: Secondary | ICD-10-CM | POA: Diagnosis not present

## 2014-07-15 DIAGNOSIS — M25671 Stiffness of right ankle, not elsewhere classified: Secondary | ICD-10-CM | POA: Diagnosis not present

## 2014-07-15 DIAGNOSIS — M6281 Muscle weakness (generalized): Secondary | ICD-10-CM | POA: Diagnosis not present

## 2014-07-15 DIAGNOSIS — M25471 Effusion, right ankle: Secondary | ICD-10-CM | POA: Diagnosis not present

## 2014-07-15 DIAGNOSIS — M25571 Pain in right ankle and joints of right foot: Secondary | ICD-10-CM | POA: Diagnosis not present

## 2014-07-17 ENCOUNTER — Telehealth: Payer: Self-pay | Admitting: Neurology

## 2014-07-17 NOTE — Telephone Encounter (Signed)
Called patient back and she wanted to speak to [place] she had wrong person. I asked Patient if I could help her with anything patient stated she wanted to talk to Richmond Campbell.

## 2014-07-17 NOTE — Telephone Encounter (Signed)
Called patient and left her a message stating that Hinton Dyer Was gone for the day and she will return her call the next business day.

## 2014-07-17 NOTE — Telephone Encounter (Signed)
Patient called and requested to speak with Rance Muir. Please call and advise.

## 2014-07-18 ENCOUNTER — Other Ambulatory Visit: Payer: Self-pay | Admitting: Neurology

## 2014-07-22 DIAGNOSIS — M6281 Muscle weakness (generalized): Secondary | ICD-10-CM | POA: Diagnosis not present

## 2014-07-22 DIAGNOSIS — M25671 Stiffness of right ankle, not elsewhere classified: Secondary | ICD-10-CM | POA: Diagnosis not present

## 2014-07-22 DIAGNOSIS — M25471 Effusion, right ankle: Secondary | ICD-10-CM | POA: Diagnosis not present

## 2014-07-22 DIAGNOSIS — M25571 Pain in right ankle and joints of right foot: Secondary | ICD-10-CM | POA: Diagnosis not present

## 2014-07-23 ENCOUNTER — Encounter: Payer: Self-pay | Admitting: Adult Health

## 2014-07-23 ENCOUNTER — Telehealth: Payer: Self-pay | Admitting: Cardiology

## 2014-07-23 ENCOUNTER — Ambulatory Visit (INDEPENDENT_AMBULATORY_CARE_PROVIDER_SITE_OTHER): Payer: Medicare Other | Admitting: Adult Health

## 2014-07-23 VITALS — BP 138/76 | HR 69 | Ht 61.0 in | Wt 177.0 lb

## 2014-07-23 DIAGNOSIS — I10 Essential (primary) hypertension: Secondary | ICD-10-CM

## 2014-07-23 DIAGNOSIS — R42 Dizziness and giddiness: Secondary | ICD-10-CM | POA: Diagnosis not present

## 2014-07-23 NOTE — Patient Instructions (Signed)
Your physician wants you to follow-up in: Feb.  You will receive a reminder letter in the mail two months in advance. If you don't receive a letter, please call our office to schedule the follow-up appointment.  Your physician recommends that you continue on your current medications as directed. Please refer to the Current Medication list given to you today.  Your physician recommends that you return for lab work in: Today  Thank you for choosing [facility]!

## 2014-07-23 NOTE — Telephone Encounter (Signed)
Pt is having problems w/ her new medication Abilify

## 2014-07-23 NOTE — Progress Notes (Signed)
Cardiology Office Note   Date:  07/23/2014   ID:  [name], DOB 06/24/1945, MRN [id number] PCP:  Delphina Cahill, MD  Cardiologist:  Hollace Kinnier, NP Domenic Polite MD  Chief Complaint  Patient presents with  . Dizziness  . Palpitations      History of Present Illness: [name] is a 69 y.o. female who presents  A walk-in with complaints of "out of body experience" and dizziness after being started on a new medication by her psychiatrist, Abilify 2mg  daily.she was concerned that this is a resting with her cardiac medicines causing her to feel her symptoms.  Orthostatic blood pressures were completed here in the office and she was found to be negative.  Heart rate remained stable.  She denies chest pain, shortness of breath, which is worse, and/ or syncope.    Past Medical History  Diagnosis Date  . Essential hypertension, benign   . Hyperlipidemia   . Bipolar disorder   . Hashimoto's thyroiditis   . Allergic rhinitis   . GERD (gastroesophageal reflux disease)   . B12 deficiency   . History of transient ischemic attack (TIA)     Or possibly migraine as well as right amaurosis fugax and ataxia - Dr. Erling Cruz  . PSVT (paroxysmal supraventricular tachycardia)     Past Surgical History  Procedure Laterality Date  . Laparoscopic cholecystectomy  2010  . Thyroidectomy  2009     Current Outpatient Prescriptions  Medication Sig Dispense Refill  . ARIPiprazole (ABILIFY) 2 MG tablet Take 2 mg by mouth daily.    Marland Kitchen aspirin EC 81 MG tablet Take 81 mg by mouth daily.    . Biotin 5000 MCG TABS Take 5,000 mcg by mouth daily.     Marland Kitchen buPROPion (WELLBUTRIN XL) 300 MG 24 hr tablet Take 300 mg by mouth daily.     . clonazePAM (KLONOPIN) 1 MG tablet Take 1 mg by mouth at bedtime. May take up to 3 times daily as needed for anxiety    . cyanocobalamin (,VITAMIN B-12,) 1000 MCG/ML injection Inject 1,000 mcg into the muscle every 30 (thirty) days.     Marland Kitchen dexlansoprazole (DEXILANT) 60 MG  capsule Take 60 mg by mouth at bedtime.     Marland Kitchen diltiazem (CARDIZEM CD) 180 MG 24 hr capsule TAKE (1) CAPSULE BY MOUTH ONCE DAILY. 30 capsule 11  . DULoxetine (CYMBALTA) 60 MG capsule Take 60 mg by mouth 2 (two) times daily.     . folic acid (FOLVITE) 1 MG tablet Take 1 mg by mouth daily.    . hyoscyamine (LEVSIN, ANASPAZ) 0.125 MG tablet Take 0.125 mg by mouth every 4 (four) hours as needed for cramping.    Marland Kitchen levothyroxine (SYNTHROID, LEVOTHROID) 112 MCG tablet Take 1 tablet (112 mcg total) by mouth daily before breakfast. 90 tablet 1  . losartan (COZAAR) 50 MG tablet Take 50 mg by mouth daily.    . Probiotic Product (PROBIOTIC DAILY) CAPS Take 1 capsule by mouth daily.    . promethazine (PHENERGAN) 25 MG tablet Take 25 mg by mouth every 6 (six) hours as needed for nausea.     No current facility-administered medications for this visit.    Allergies:   Penicillins; Amoxicillin-pot clavulanate; Atenolol; and Sulfonamide derivatives    Social History:  The patient  reports that she quit smoking about 31 years ago. Her smoking use included Cigarettes. She has a 5 pack-year smoking history. She has never used smokeless tobacco. She reports that she does not  drink alcohol or use illicit drugs.   Family History:  The patient's family history includes Aneurysm in her sister; Depression in her father; Diabetes in her brother; Heart attack in her brother; Heart failure in her maternal grandfather, maternal grandmother, and paternal grandmother; Hypertension in her brother and sister; Stroke in her maternal grandfather, maternal grandmother, and mother; Thyroid disease in her brother and mother.    ROS: .   All other systems are reviewed and negative.Unless otherwise mentioned in H&P above.   PHYSICAL EXAM: VS:  BP 138/76 mmHg  Pulse 69  Ht 5\' 1"  (1.549 m)  Wt 177 lb (80.287 kg)  BMI 33.46 kg/m2  SpO2 97% , BMI Body mass index is 33.46 kg/(m^2). GEN: Well nourished, well developed, in no acute  distress HEENT: normal Neck: no JVD, carotid bruits, or masses Cardiac: RRR; no murmurs, rubs, or gallops,no edema  Respiratory:  clear to auscultation bilaterally, normal work of breathing GI: soft, nontender, nondistended, + BS MS: no deformity or atrophy Skin: warm and dry, no rash Neuro:  Strength and sensation are intact Psych: euthymic mood, full affect  Recent Labs: 07/04/2014: ALT 16; BUN 8; Creatinine, Ser 0.65; Hemoglobin 12.7; Platelets 435*; Potassium 4.7; Sodium 133*      Wt Readings from Last 3 Encounters:  07/23/14 177 lb (80.287 kg)  07/04/14 180 lb (81.647 kg)  06/06/14 180 lb (81.647 kg)      Other studies Reviewed: Additional studies/ records that were reviewed today include: None Review of the above records demonstrates: N/A   ASSESSMENT AND PLAN:  1.Dizziness: She describes a "out of body experience" with addition of a medication called Abilify 10 mg daily.  She is on multiple medications, which can cause dizziness.  We did check orthostatic blood pressures and were found to be negative.  I reassured her that it is unlikely that her heart medications are contributing.  As I am unfamiliar with the psychotropic medications.  I advised her to follow-up with her psychiatrist in order to adjust medications in case the doses can be decreased with her symptoms.  In the interim, I will check a CBC to for anemia, BMET for dehydration, and magnesium.  The patient is on dexilent, which case can cause some hypomagnesemia.  2. Palpitations: she denies any recurrent palpitations.  Checking labs.     Current medicines are reviewed at length with the patient today.    Labs/ tests ordered today include: CBC, BMET, magnesium. No orders of the defined types were placed in this encounter.     Disposition:   FU with Dr. Domenic Polite in February of 2017. Signed, Jory Sims, NP  07/23/2014 3:17 PM    [facility] [address] [place], [place]  [address] Phone: [phone]; Fax: [phone]

## 2014-07-23 NOTE — Telephone Encounter (Signed)
Patient request OV today. She started taking Abilify 30 days ago. Pt is now is now c/o feeling dizzy, fatigue, " out of body experience", hearing things that were not said. Pt states she has talked to her psychiatrics, that states this is not the Abilify. Pt. Thinks that she may be having a drug interaction with Diltiazem. Please advise.

## 2014-07-23 NOTE — Telephone Encounter (Signed)
To be seen today by Ms lawrence

## 2014-07-24 DIAGNOSIS — M6281 Muscle weakness (generalized): Secondary | ICD-10-CM | POA: Diagnosis not present

## 2014-07-24 DIAGNOSIS — M25471 Effusion, right ankle: Secondary | ICD-10-CM | POA: Diagnosis not present

## 2014-07-24 DIAGNOSIS — M25571 Pain in right ankle and joints of right foot: Secondary | ICD-10-CM | POA: Diagnosis not present

## 2014-07-24 DIAGNOSIS — M25671 Stiffness of right ankle, not elsewhere classified: Secondary | ICD-10-CM | POA: Diagnosis not present

## 2014-07-24 LAB — CBC WITH DIFFERENTIAL/PLATELET
BASOS PCT: 0 % (ref 0–1)
Basophils Absolute: 0 10*3/uL (ref 0.0–0.1)
Eosinophils Absolute: 0.3 10*3/uL (ref 0.0–0.7)
Eosinophils Relative: 3 % (ref 0–5)
HCT: 36.9 % (ref 36.0–46.0)
Hemoglobin: 12.5 g/dL (ref 12.0–15.0)
LYMPHS ABS: 1.6 10*3/uL (ref 0.7–4.0)
LYMPHS PCT: 18 % (ref 12–46)
MCH: 30.3 pg (ref 26.0–34.0)
MCHC: 33.9 g/dL (ref 30.0–36.0)
MCV: 89.6 fL (ref 78.0–100.0)
MONO ABS: 0.8 10*3/uL (ref 0.1–1.0)
MPV: 8.9 fL (ref 8.6–12.4)
Monocytes Relative: 9 % (ref 3–12)
NEUTROS ABS: 6.2 10*3/uL (ref 1.7–7.7)
Neutrophils Relative %: 70 % (ref 43–77)
PLATELETS: 487 10*3/uL — AB (ref 150–400)
RBC: 4.12 MIL/uL (ref 3.87–5.11)
RDW: 14 % (ref 11.5–15.5)
WBC: 8.9 10*3/uL (ref 4.0–10.5)

## 2014-07-24 LAB — BASIC METABOLIC PANEL
BUN: 9 mg/dL (ref 6–23)
CO2: 28 mEq/L (ref 19–32)
CREATININE: 0.62 mg/dL (ref 0.50–1.10)
Calcium: 9.7 mg/dL (ref 8.4–10.5)
Chloride: 96 mEq/L (ref 96–112)
Glucose, Bld: 82 mg/dL (ref 70–99)
Potassium: 4.3 mEq/L (ref 3.5–5.3)
Sodium: 136 mEq/L (ref 135–145)

## 2014-07-24 LAB — MAGNESIUM: Magnesium: 2.2 mg/dL (ref 1.5–2.5)

## 2014-07-25 DIAGNOSIS — S8002XA Contusion of left knee, initial encounter: Secondary | ICD-10-CM | POA: Diagnosis not present

## 2014-07-25 DIAGNOSIS — M1712 Unilateral primary osteoarthritis, left knee: Secondary | ICD-10-CM | POA: Diagnosis not present

## 2014-07-25 DIAGNOSIS — M25571 Pain in right ankle and joints of right foot: Secondary | ICD-10-CM | POA: Diagnosis not present

## 2014-08-05 DIAGNOSIS — M25671 Stiffness of right ankle, not elsewhere classified: Secondary | ICD-10-CM | POA: Diagnosis not present

## 2014-08-05 DIAGNOSIS — M25571 Pain in right ankle and joints of right foot: Secondary | ICD-10-CM | POA: Diagnosis not present

## 2014-08-05 DIAGNOSIS — M6281 Muscle weakness (generalized): Secondary | ICD-10-CM | POA: Diagnosis not present

## 2014-08-05 DIAGNOSIS — M25471 Effusion, right ankle: Secondary | ICD-10-CM | POA: Diagnosis not present

## 2014-08-07 DIAGNOSIS — M25471 Effusion, right ankle: Secondary | ICD-10-CM | POA: Diagnosis not present

## 2014-08-07 DIAGNOSIS — M25571 Pain in right ankle and joints of right foot: Secondary | ICD-10-CM | POA: Diagnosis not present

## 2014-08-07 DIAGNOSIS — M25671 Stiffness of right ankle, not elsewhere classified: Secondary | ICD-10-CM | POA: Diagnosis not present

## 2014-08-07 DIAGNOSIS — M6281 Muscle weakness (generalized): Secondary | ICD-10-CM | POA: Diagnosis not present

## 2014-08-08 DIAGNOSIS — Z23 Encounter for immunization: Secondary | ICD-10-CM | POA: Diagnosis not present

## 2014-08-14 DIAGNOSIS — M25571 Pain in right ankle and joints of right foot: Secondary | ICD-10-CM | POA: Diagnosis not present

## 2014-08-14 DIAGNOSIS — M25471 Effusion, right ankle: Secondary | ICD-10-CM | POA: Diagnosis not present

## 2014-08-14 DIAGNOSIS — M6281 Muscle weakness (generalized): Secondary | ICD-10-CM | POA: Diagnosis not present

## 2014-08-14 DIAGNOSIS — M25671 Stiffness of right ankle, not elsewhere classified: Secondary | ICD-10-CM | POA: Diagnosis not present

## 2014-08-15 DIAGNOSIS — M25471 Effusion, right ankle: Secondary | ICD-10-CM | POA: Diagnosis not present

## 2014-08-15 DIAGNOSIS — M25671 Stiffness of right ankle, not elsewhere classified: Secondary | ICD-10-CM | POA: Diagnosis not present

## 2014-08-15 DIAGNOSIS — M25571 Pain in right ankle and joints of right foot: Secondary | ICD-10-CM | POA: Diagnosis not present

## 2014-08-15 DIAGNOSIS — M6281 Muscle weakness (generalized): Secondary | ICD-10-CM | POA: Diagnosis not present

## 2014-08-19 DIAGNOSIS — M25571 Pain in right ankle and joints of right foot: Secondary | ICD-10-CM | POA: Diagnosis not present

## 2014-08-19 DIAGNOSIS — M25471 Effusion, right ankle: Secondary | ICD-10-CM | POA: Diagnosis not present

## 2014-08-19 DIAGNOSIS — M25671 Stiffness of right ankle, not elsewhere classified: Secondary | ICD-10-CM | POA: Diagnosis not present

## 2014-08-19 DIAGNOSIS — M6281 Muscle weakness (generalized): Secondary | ICD-10-CM | POA: Diagnosis not present

## 2014-08-20 ENCOUNTER — Other Ambulatory Visit: Payer: Self-pay | Admitting: Adult Health

## 2014-08-21 DIAGNOSIS — M25671 Stiffness of right ankle, not elsewhere classified: Secondary | ICD-10-CM | POA: Diagnosis not present

## 2014-08-21 DIAGNOSIS — M6281 Muscle weakness (generalized): Secondary | ICD-10-CM | POA: Diagnosis not present

## 2014-08-21 DIAGNOSIS — M25571 Pain in right ankle and joints of right foot: Secondary | ICD-10-CM | POA: Diagnosis not present

## 2014-08-21 DIAGNOSIS — M25471 Effusion, right ankle: Secondary | ICD-10-CM | POA: Diagnosis not present

## 2014-08-22 DIAGNOSIS — M25571 Pain in right ankle and joints of right foot: Secondary | ICD-10-CM | POA: Diagnosis not present

## 2014-08-22 DIAGNOSIS — M25671 Stiffness of right ankle, not elsewhere classified: Secondary | ICD-10-CM | POA: Diagnosis not present

## 2014-08-22 DIAGNOSIS — M25471 Effusion, right ankle: Secondary | ICD-10-CM | POA: Diagnosis not present

## 2014-08-22 DIAGNOSIS — M6281 Muscle weakness (generalized): Secondary | ICD-10-CM | POA: Diagnosis not present

## 2014-09-04 DIAGNOSIS — M25571 Pain in right ankle and joints of right foot: Secondary | ICD-10-CM | POA: Diagnosis not present

## 2014-09-04 DIAGNOSIS — M1712 Unilateral primary osteoarthritis, left knee: Secondary | ICD-10-CM | POA: Diagnosis not present

## 2014-09-10 ENCOUNTER — Other Ambulatory Visit (HOSPITAL_COMMUNITY): Payer: Self-pay | Admitting: Orthopedic Surgery

## 2014-09-10 DIAGNOSIS — M25571 Pain in right ankle and joints of right foot: Secondary | ICD-10-CM

## 2014-09-17 ENCOUNTER — Ambulatory Visit: Payer: BLUE CROSS/BLUE SHIELD | Admitting: Neurology

## 2014-09-19 DIAGNOSIS — J302 Other seasonal allergic rhinitis: Secondary | ICD-10-CM | POA: Diagnosis not present

## 2014-09-19 DIAGNOSIS — B351 Tinea unguium: Secondary | ICD-10-CM | POA: Diagnosis not present

## 2014-09-19 DIAGNOSIS — Z23 Encounter for immunization: Secondary | ICD-10-CM | POA: Diagnosis not present

## 2014-09-20 ENCOUNTER — Ambulatory Visit (HOSPITAL_COMMUNITY)
Admission: RE | Admit: 2014-09-20 | Discharge: 2014-09-20 | Disposition: A | Discharge status: Home / Self Care | Payer: Medicare Other | Source: Ambulatory Visit | Attending: Orthopedic Surgery | Admitting: Orthopedic Surgery

## 2014-09-20 DIAGNOSIS — M25571 Pain in right ankle and joints of right foot: Secondary | ICD-10-CM | POA: Diagnosis not present

## 2014-09-20 DIAGNOSIS — R937 Abnormal findings on diagnostic imaging of other parts of musculoskeletal system: Secondary | ICD-10-CM | POA: Diagnosis not present

## 2014-09-20 DIAGNOSIS — M722 Plantar fascial fibromatosis: Secondary | ICD-10-CM | POA: Diagnosis not present

## 2014-09-26 DIAGNOSIS — B351 Tinea unguium: Secondary | ICD-10-CM | POA: Diagnosis not present

## 2014-10-14 DIAGNOSIS — L603 Nail dystrophy: Secondary | ICD-10-CM | POA: Diagnosis not present

## 2014-10-14 DIAGNOSIS — L719 Rosacea, unspecified: Secondary | ICD-10-CM | POA: Diagnosis not present

## 2014-10-15 DIAGNOSIS — M84374A Stress fracture, right foot, initial encounter for fracture: Secondary | ICD-10-CM | POA: Diagnosis not present

## 2014-10-15 DIAGNOSIS — S86311A Strain of muscle(s) and tendon(s) of peroneal muscle group at lower leg level, right leg, initial encounter: Secondary | ICD-10-CM | POA: Diagnosis not present

## 2014-11-11 ENCOUNTER — Ambulatory Visit (INDEPENDENT_AMBULATORY_CARE_PROVIDER_SITE_OTHER): Payer: Medicare Other | Admitting: Internal Medicine

## 2014-11-11 ENCOUNTER — Encounter: Payer: Self-pay | Admitting: Internal Medicine

## 2014-11-11 VITALS — BP 108/68 | HR 73 | Temp 97.7°F | Resp 12 | Wt 180.8 lb

## 2014-11-11 DIAGNOSIS — E063 Autoimmune thyroiditis: Secondary | ICD-10-CM | POA: Diagnosis not present

## 2014-11-11 DIAGNOSIS — N760 Acute vaginitis: Secondary | ICD-10-CM | POA: Diagnosis not present

## 2014-11-11 LAB — T4, FREE: Free T4: 0.97 ng/dL (ref 0.60–1.60)

## 2014-11-11 LAB — TSH: TSH: 2.39 u[IU]/mL (ref 0.35–4.50)

## 2014-11-11 NOTE — Patient Instructions (Signed)
Please come back for a follow-up appointment in 6 months.  Please stop at the lab.    

## 2014-11-11 NOTE — Progress Notes (Signed)
Patient ID: [name], female   DOB: [birth year], 69 y.o.   MRN: [id number]   HPI  [name] is a 69 y.o.-year-old female, returning for f/u for postsurgical hypothyroidism. Last visit 1 year and 8 mo ago.  Since last visit, she started on Abilify and Cymbalta for depression.  Reviewed hx: Pt. has been dx with Hashimoto hypothyroidism in ~2004; then had thyroidectomy in 10/24/2007 for an FNA showing atypical cells >> final [id number] thyroiditis.  Pt is on Synthroid DAW 112 mcg >> 125 mcg (cannot remember the last TSH level) - 3 mo ago by PCP, taken: - fasting - with water - separated by >30 min from b'fast  - takes calcium (Tums) later - no iron, multivitamins  - separated by >4h from [place] (which is prn) - on Biotin, but not today  She feels much better after the increase in Synthroid dose.  She was on Armour Thyroid in the past and also on Cytomel >> did not feel well on these.  I reviewed pt's thyroid tests: Lab Results  Component Value Date   TSH 0.38 03/16/2013   TSH 0.840 12/12/2012   TSH 1.522 09/17/2011   FREET4 1.06 03/16/2013   FREET4 1.41 12/12/2012  11/07/2012: TSH 1.006, fT4 1.28, TT3 94.6 (80-204), Vit D 52, CMP normal 08/15/2012: TSH 0.181, fT4 1.47 (dose decreased from 125 to 112 mcg daily)    She c/o: - + weight gain - + fatigue - + constipation - + hair loss - on Biotin for this - + hot flushes/+ getting cold - + anxiety/+ depression - + memory problems/fogginess  She also has a history of high platelets (in the 400s), also HTN and HL. No anemia. Checked 2x for celiac ds: negative. Has h/o B12 def., gets B12 injections.  ROS: Constitutional: see HPI, + nocturia Eyes: + blurry vision (with migraines), no xerophthalmia ENT: no sore throat, no nodules palpated in throat, + dysphagia/no odynophagia, no hoarseness Cardiovascular: no CP/SOB/+palpitations/no leg swelling Respiratory: no cough/SOB Gastrointestinal: no N/V/D/+ C/no acid  reflux Musculoskeletal: no muscle/joint aches Skin: no rashes, + hair loss Neurological: no tremors/numbness/tingling/dizziness, + migraines  I reviewed pt's medications, allergies, PMH, social hx, family hx, and changes were documented in the history of present illness. Otherwise, unchanged from my initial visit note.  PE: BP 108/68 mmHg  Pulse 73  Temp(Src) 97.7 F (36.5 C) (Oral)  Resp 12  Wt 180 lb 12.8 oz (82.01 kg)  SpO2 96% Body mass index is 33.06 kg/(m^2). Wt Readings from Last 3 Encounters:  11/11/14 180 lb 12.8 oz (82.01 kg)  09/20/14 180 lb (81.647 kg)  07/23/14 177 lb (80.287 kg)   Constitutional: overweight, in NAD Eyes: PERRLA, EOMI, no exophthalmos ENT: moist mucous membranes, no neck masses palpable., healed cervical scar, no cervical lymphadenopathy Cardiovascular: RRR, No MRG Respiratory: CTA B Gastrointestinal: abdomen soft, NT, ND, BS+ Musculoskeletal: no deformities, strength intact in all 4 Skin: moist, warm, no rashes Neuro: mild tremor in R arm (outstretched), not new  ASSESSMENT: 1. Postsurgical hypothyroidism - s/p thyroidectomy in 2009, for goiter, nonmalignant  PLAN:  1. Patient with long-standing hypothyroidism, on Synthroid therapy. She appears euthyroid. Last set of TFTs reviewed were normal, but they were checked >1.5 years ago. She had more recent TFTs at her PCP's office >> dose of LT4 was increased (no records available for review) and she feels much better. Will continue this dose and recheck the TFTs today. We discussed about possible consequences of overreplacement with LT4, especially  arrhythmia. She is on Cardizem. She has palpitations, which are not new. - We again discussed about correct intake of levothyroxine, fasting, with water, separated by at least 30 minutes from breakfast, and separated by more than 4 hours from calcium, iron, multivitamins, acid reflux medications (PPIs). She is taking it correctly. - if test start to fluctuate  again, may need Tirosint for more consistent absorption  - RTC in 6 months  Component     Latest Ref Rng 11/11/2014  TSH     0.35 - 4.50 uIU/mL 2.39  Free T4     0.60 - 1.60 ng/dL 0.97   Normal thyroid tests >>  we can continue the same dose of Synthroid, 125 g daily.

## 2014-11-26 DIAGNOSIS — F3181 Bipolar II disorder: Secondary | ICD-10-CM | POA: Diagnosis not present

## 2014-12-02 ENCOUNTER — Telehealth: Payer: Self-pay | Admitting: Internal Medicine

## 2014-12-02 MED ORDER — SYNTHROID 125 MCG PO TABS: 125.0000 ug | ORAL_TABLET | Freq: Every day | ORAL | Status: DC

## 2014-12-02 NOTE — Telephone Encounter (Signed)
Patient need a refill of her Synthroid, send to [place]

## 2014-12-02 NOTE — Telephone Encounter (Signed)
[name], This is your patient. Thanks!

## 2014-12-23 DIAGNOSIS — S86311D Strain of muscle(s) and tendon(s) of peroneal muscle group at lower leg level, right leg, subsequent encounter: Secondary | ICD-10-CM | POA: Diagnosis not present

## 2014-12-23 DIAGNOSIS — M84374D Stress fracture, right foot, subsequent encounter for fracture with routine healing: Secondary | ICD-10-CM | POA: Diagnosis not present

## 2014-12-23 DIAGNOSIS — L603 Nail dystrophy: Secondary | ICD-10-CM | POA: Diagnosis not present

## 2014-12-23 DIAGNOSIS — L728 Other follicular cysts of the skin and subcutaneous tissue: Secondary | ICD-10-CM | POA: Diagnosis not present

## 2014-12-27 ENCOUNTER — Telehealth: Payer: Self-pay | Admitting: Internal Medicine

## 2014-12-27 NOTE — Telephone Encounter (Signed)
In interacts with our thyroid tests assay.

## 2014-12-27 NOTE — Telephone Encounter (Signed)
She can take biotin at anytime she wants, however, do not take it for 2 days before lab draw for thyroid tests.  Vitamin D, folic acid, or B vitamins DO NOT interfere with her levothyroxine absorption so she can take those anytime she wants, also. Only multivitamins do interfere with it, because they contain calcium and iron - they need to be taken at least 4 hours after thyroid hormones.

## 2014-12-27 NOTE — Telephone Encounter (Signed)
Called pt and advised her. She voiced understanding.

## 2014-12-27 NOTE — Telephone Encounter (Signed)
Please read message below and advise.  

## 2014-12-27 NOTE — Telephone Encounter (Signed)
Pt asked if she could take it at bedtime? Pt has been taking her Vitamin D and folic acid after her thyroid meds. Advised her that she should take those supplements at least 4 hrs after taking the thyroid med. Pt asked if she could take them at bedtime.

## 2014-12-27 NOTE — Telephone Encounter (Signed)
Patient what's the reason why Dr Cruzita Lederer don't wont her to take biotin, please advise

## 2015-01-13 ENCOUNTER — Telehealth: Payer: Self-pay | Admitting: Internal Medicine

## 2015-01-13 NOTE — Telephone Encounter (Signed)
See note below and please advise, Thanks! 

## 2015-01-13 NOTE — Telephone Encounter (Signed)
Yes, we can check TSH and fT4 - but don't forget to stop Biotin before the tests, ideally, for high Biotin doses, stop for 5 days before the tests.

## 2015-01-13 NOTE — Telephone Encounter (Signed)
Pt is having severe hair loss is there anything we can do? Can she have another TSH check and see if there could be any med she is on that could be called in

## 2015-01-14 ENCOUNTER — Other Ambulatory Visit: Payer: Self-pay | Admitting: *Deleted

## 2015-01-14 DIAGNOSIS — E063 Autoimmune thyroiditis: Secondary | ICD-10-CM

## 2015-01-14 NOTE — Telephone Encounter (Signed)
Called pt and advised her. Pt to call back and schedule appt.

## 2015-01-21 ENCOUNTER — Other Ambulatory Visit (INDEPENDENT_AMBULATORY_CARE_PROVIDER_SITE_OTHER): Payer: Medicare Other

## 2015-01-21 DIAGNOSIS — E063 Autoimmune thyroiditis: Secondary | ICD-10-CM

## 2015-01-21 LAB — T4, FREE: FREE T4: 1.12 ng/dL (ref 0.60–1.60)

## 2015-01-21 LAB — TSH: TSH: 0.71 u[IU]/mL (ref 0.35–4.50)

## 2015-01-28 ENCOUNTER — Telehealth: Payer: Self-pay | Admitting: Internal Medicine

## 2015-01-28 NOTE — Telephone Encounter (Signed)
Pt is making sure its ok for her to take vitamin c 500/1000 with each meal. Will this mess with her levels

## 2015-01-28 NOTE — Telephone Encounter (Signed)
Returned pt's call and lvm advising her to be sure there is at least 4 hrs between the time she takes her thyroid medication and her vitamins. Advised hope that helps answer her question.

## 2015-02-24 DIAGNOSIS — L659 Nonscarring hair loss, unspecified: Secondary | ICD-10-CM | POA: Diagnosis not present

## 2015-02-24 DIAGNOSIS — L650 Telogen effluvium: Secondary | ICD-10-CM | POA: Diagnosis not present

## 2015-03-04 DIAGNOSIS — Z01419 Encounter for gynecological examination (general) (routine) without abnormal findings: Secondary | ICD-10-CM | POA: Diagnosis not present

## 2015-03-10 ENCOUNTER — Ambulatory Visit (INDEPENDENT_AMBULATORY_CARE_PROVIDER_SITE_OTHER): Payer: PPO | Admitting: Cardiology

## 2015-03-10 ENCOUNTER — Encounter: Payer: Self-pay | Admitting: Cardiology

## 2015-03-10 VITALS — BP 120/70 | HR 78 | Ht 61.5 in | Wt 167.0 lb

## 2015-03-10 DIAGNOSIS — I10 Essential (primary) hypertension: Secondary | ICD-10-CM

## 2015-03-10 DIAGNOSIS — I471 Supraventricular tachycardia: Secondary | ICD-10-CM

## 2015-03-10 NOTE — Progress Notes (Signed)
Cardiology Office Note  Date: 03/10/2015   ID: [name], DOB 1945-07-30, MRN [id number]  PCP: Wende Neighbors, MD  Primary Cardiologist: Rozann Lesches, MD   Chief Complaint  Patient presents with  . PSVT    History of Present Illness: [name] is a 70 y.o. female last seen by Ms. Lawrence NP in June 2016. She presents for a routine follow-up visit. Since last encounter she states that she has not had any progressive sense of palpitations, no dizziness or syncope. She denies any chest pain.  We reviewed her medications are outlined below. She continues on aspirin as well as Cardizem CD 180 mg daily. Blood pressure today is normal. She is also on Cozaar.  She states that she has a 24-month-old grandchild that she has been spending a lot of time with recently.  Past Medical History  Diagnosis Date  . Essential hypertension, benign   . Hyperlipidemia   . Bipolar disorder ([place])   . Hashimoto's thyroiditis   . Allergic rhinitis   . GERD (gastroesophageal reflux disease)   . B12 deficiency   . History of transient ischemic attack (TIA)     Or possibly migraine as well as right amaurosis fugax and ataxia - Dr. Erling Cruz  . PSVT (paroxysmal supraventricular tachycardia) (HCC)     Current Outpatient Prescriptions  Medication Sig Dispense Refill  . aspirin EC 81 MG tablet Take 81 mg by mouth daily.    . Biotin 5000 MCG TABS Take 5,000 mcg by mouth daily.     Marland Kitchen buPROPion (WELLBUTRIN XL) 300 MG 24 hr tablet Take 300 mg by mouth daily.     . Cholecalciferol (VITAMIN D3) 1000 UNITS CAPS Take 2 capsules by mouth daily.    . clonazePAM (KLONOPIN) 1 MG tablet Take 1 mg by mouth at bedtime. May take up to 3 times daily as needed for anxiety    . cyanocobalamin (,VITAMIN B-12,) 1000 MCG/ML injection Inject 1,000 mcg into the muscle every 30 (thirty) days.     Marland Kitchen dexlansoprazole (DEXILANT) 60 MG capsule Take 60 mg by mouth at bedtime.     Marland Kitchen diltiazem (CARDIZEM CD) 180 MG 24 hr capsule  TAKE (1) CAPSULE BY MOUTH ONCE DAILY. 30 capsule 11  . DULoxetine (CYMBALTA) 60 MG capsule Take 60 mg by mouth 2 (two) times daily.     . folic acid (FOLVITE) 1 MG tablet Take 1 mg by mouth daily.    . hyoscyamine (LEVSIN, ANASPAZ) 0.125 MG tablet Take 0.125 mg by mouth every 4 (four) hours as needed for cramping.    Marland Kitchen losartan (COZAAR) 50 MG tablet Take 50 mg by mouth daily.    . Probiotic Product (PROBIOTIC DAILY) CAPS Take 1 capsule by mouth daily.    . promethazine (PHENERGAN) 25 MG tablet Take 25 mg by mouth every 6 (six) hours as needed for nausea.    Marland Kitchen pyridOXINE (VITAMIN B-6) 100 MG tablet Take 100 mg by mouth daily.    Marland Kitchen SYNTHROID 125 MCG tablet Take 1 tablet (125 mcg total) by mouth daily before breakfast. 30 tablet 5   No current facility-administered medications for this visit.   Allergies:  Penicillins; Amoxicillin-pot clavulanate; Atenolol; and Sulfonamide derivatives   Social History: The patient  reports that she quit smoking about 32 years ago. Her smoking use included Cigarettes. She has a 5 pack-year smoking history. She has never used smokeless tobacco. She reports that she does not drink alcohol or use illicit drugs.  ROS:  Please see the history of present illness. Otherwise, complete review of systems is positive for fatigue, iron deficiency anemia.  All other systems are reviewed and negative.   Physical Exam: VS:  BP 120/70 mmHg  Pulse 78  Ht 5' 1.5" (1.562 m)  Wt 167 lb (75.751 kg)  BMI 31.05 kg/m2  SpO2 99%, BMI Body mass index is 31.05 kg/(m^2).  Wt Readings from Last 3 Encounters:  03/10/15 167 lb (75.751 kg)  11/11/14 180 lb 12.8 oz (82.01 kg)  09/20/14 180 lb (81.647 kg)    Appears comfortable at rest.  HEENT: Conjunctiva and lids normal, oropharynx with moist mucosa.  Neck: Supple, no elevated JVP or carotid bruits.  Lungs: Clear to auscultation, nonlabored.  Cardiac: Regular rate and rhythm, soft systolic murmur at the base with preserved  second heart sound, no pericardial rub or S3 gallop.  Abdomen: Soft, nontender, bowel sounds present.  Extremities: No pitting edema, distal pulses full.   ECG: I personally reviewed the prior tracing from 07/05/2014 which showed normal sinus rhythm with incomplete right bundle branch block.  Recent Labwork: 07/04/2014: ALT 16; AST 19 07/23/2014: BUN 9; Creat 0.62; Hemoglobin 12.5; Magnesium 2.2; Platelets 487*; Potassium 4.3; Sodium 136 01/21/2015: TSH 0.71   Other Studies Reviewed Today:  Echocardiogram 02/11/2011: Study Conclusions  - Left ventricle: The cavity size was normal. Wall thickness was at the upper limits of normal. Systolic function was vigorous. The estimated ejection fraction was in the range of 65% to 70%. Wall motion was normal; there were no regional wall motion abnormalities. The study is not technically sufficient to allow evaluation of LV diastolic function. - Mitral valve: Trivial regurgitation. - Atrial septum: No clear defect or patent foramen ovale was identified. - Tricuspid valve: Mild regurgitation. - Pulmonary arteries: PA peak pressure: 2mm Hg (S). - Pericardium, extracardiac: There was no pericardial effusion.  Assessment and Plan:  1. History of PSVT, no significant change in symptomatology on calcium channel blocker. We will continue observation.  2. Essential hypertension, blood pressure control is good today, no changes made to current regimen.  Current medicines were reviewed with the patient today.  Disposition: FU with me in 6 months.   Signed, Satira Sark, MD, [facility] 03/10/2015 1:16 PM    [facility] at [facility] [address] [place],  [address] Phone: [phone]; Fax: [phone]

## 2015-03-10 NOTE — Patient Instructions (Signed)
Your physician wants you to follow-up in: 6 months with Dr McDowell You will receive a reminder letter in the mail two months in advance. If you don't receive a letter, please call our office to schedule the follow-up appointment.     Your physician recommends that you continue on your current medications as directed. Please refer to the Current Medication list given to you today.    If you need a refill on your cardiac medications before your next appointment, please call your pharmacy.     Thank you for choosing [facility] !        

## 2015-03-20 DIAGNOSIS — I10 Essential (primary) hypertension: Secondary | ICD-10-CM | POA: Diagnosis not present

## 2015-04-07 DIAGNOSIS — Z780 Asymptomatic menopausal state: Secondary | ICD-10-CM | POA: Diagnosis not present

## 2015-04-07 DIAGNOSIS — M85852 Other specified disorders of bone density and structure, left thigh: Secondary | ICD-10-CM | POA: Diagnosis not present

## 2015-04-07 DIAGNOSIS — M85851 Other specified disorders of bone density and structure, right thigh: Secondary | ICD-10-CM | POA: Diagnosis not present

## 2015-04-21 ENCOUNTER — Other Ambulatory Visit: Payer: Self-pay

## 2015-04-21 DIAGNOSIS — D485 Neoplasm of uncertain behavior of skin: Secondary | ICD-10-CM | POA: Diagnosis not present

## 2015-04-21 DIAGNOSIS — L603 Nail dystrophy: Secondary | ICD-10-CM | POA: Diagnosis not present

## 2015-04-21 DIAGNOSIS — D239 Other benign neoplasm of skin, unspecified: Secondary | ICD-10-CM | POA: Diagnosis not present

## 2015-04-21 DIAGNOSIS — D2261 Melanocytic nevi of right upper limb, including shoulder: Secondary | ICD-10-CM | POA: Diagnosis not present

## 2015-04-28 DIAGNOSIS — M8580 Other specified disorders of bone density and structure, unspecified site: Secondary | ICD-10-CM | POA: Diagnosis not present

## 2015-05-12 ENCOUNTER — Ambulatory Visit (INDEPENDENT_AMBULATORY_CARE_PROVIDER_SITE_OTHER): Payer: PPO | Admitting: Internal Medicine

## 2015-05-12 ENCOUNTER — Encounter: Payer: Self-pay | Admitting: Internal Medicine

## 2015-05-12 VITALS — BP 108/76 | HR 77 | Temp 98.3°F | Ht 61.5 in | Wt 158.5 lb

## 2015-05-12 DIAGNOSIS — E890 Postprocedural hypothyroidism: Secondary | ICD-10-CM

## 2015-05-12 LAB — T4, FREE: FREE T4: 1.25 ng/dL (ref 0.60–1.60)

## 2015-05-12 LAB — TSH: TSH: 0.99 u[IU]/mL (ref 0.35–4.50)

## 2015-05-12 NOTE — Progress Notes (Signed)
Patient ID: [name], female   DOB: 1946-01-02, 70 y.o.   MRN: [id number]   HPI  [name] is a 70 y.o.-year-old female, returning for f/u for postsurgical hypothyroidism. She has a h/o Hashimoto's thyroiditis before her thyroidectomy. Last visit 6 mo ago.  She intentionall lost 22 lbs since last visit! She cut down sweets and started to walk. She also has an 8-mo grandbaby. "I feel the best I ever felt".  Reviewed hx: Pt. has been dx with Hashimoto hypothyroidism in ~2004; then had thyroidectomy in 10/24/2007 for an FNA showing atypical cells >> final [id number] thyroiditis.   She was on Armour Thyroid in the past and also on Cytomel >> did not feel well on these.  Pt is on Synthroid DAW 125 mcg, taken: - fasting - with water - separated by >30 min from b'fast  - takes calcium (Tums) later - no iron, multivitamins  - separated by >4h from [place] (which is prn) - on Biotin, but not in last 2 days  She is taking Rogaine for female pattern baldness.   I reviewed pt's thyroid tests: Lab Results  Component Value Date   TSH 0.71 01/21/2015   TSH 2.39 11/11/2014   TSH 0.38 03/16/2013   TSH 0.840 12/12/2012   TSH 1.522 09/17/2011   FREET4 1.12 01/21/2015   FREET4 0.97 11/11/2014   FREET4 1.06 03/16/2013   FREET4 1.41 12/12/2012  11/07/2012: TSH 1.006, fT4 1.28, TT3 94.6 (80-204), Vit D 52, CMP normal 08/15/2012: TSH 0.181, fT4 1.47 (dose decreased from 125 to 112 mcg daily)    She c/o: - + weight loss - no fatigue - + constipation - + hair loss - on Biotin for this - + hot flushes - no memory problems/fogginess  She also has a history of high platelets (in the 400s), also HTN and HL. No anemia. Checked 2x for celiac ds: negative. Has h/o B12 def., gets B12 injections.  ROS: Constitutional: see HPI Eyes: no blurry vision , no xerophthalmia ENT: + sore throat, no nodules palpated in throat, no dysphagia/no odynophagia, no hoarseness Cardiovascular: no  CP/SOB/+palpitations/no leg swelling Respiratory: no cough/SOB Gastrointestinal: no N/V/D/+ C/no acid reflux Musculoskeletal: no muscle/joint aches Skin: no rashes, + hair loss Neurological: no tremors/numbness/tingling/dizziness  I reviewed pt's medications, allergies, PMH, social hx, family hx, and changes were documented in the history of present illness. Otherwise, unchanged from my initial visit note.  PE: BP 108/76 mmHg  Pulse 77  Temp(Src) 98.3 F (36.8 C) (Oral)  Ht 5' 1.5" (1.562 m)  Wt 158 lb 8 oz (71.895 kg)  BMI 29.47 kg/m2  SpO2 99% Body mass index is 29.47 kg/(m^2). Wt Readings from Last 3 Encounters:  05/12/15 158 lb 8 oz (71.895 kg)  03/10/15 167 lb (75.751 kg)  11/11/14 180 lb 12.8 oz (82.01 kg)   Constitutional: overweight, in NAD Eyes: PERRLA, EOMI, no exophthalmos ENT: moist mucous membranes, no neck masses palpable., healed cervical scar, no cervical lymphadenopathy Cardiovascular: RRR, No MRG Respiratory: CTA B Gastrointestinal: abdomen soft, NT, ND, BS+ Musculoskeletal: no deformities, strength intact in all 4 Skin: moist, warm, no rashes Neuro: no tremors with outstretched hands  ASSESSMENT: 1. Postsurgical hypothyroidism - s/p thyroidectomy in 2009, for goiter, nonmalignant - h/o Hashimoto's thyroiditis  PLAN:  1. Patient with long-standing hypothyroidism, on Synthroid therapy. She appears euthyroid and feels great. Last set of TFTs reviewed were normal 4 mo ago.  - Will recheck the TFTs today.  - She has a h/o palpitations,  not current. She is on Cardizem.  - We again discussed about correct intake of levothyroxine, fasting, with water, separated by at least 30 minutes from breakfast, and separated by more than 4 hours from calcium, iron, multivitamins, acid reflux medications (PPIs). She is taking it correctly. - I also advised her to continue to stay off Biotin for at least 2 days before labs, but may need to extend to 4-5 days in the future. -  RTC in 1 year  Office Visit on 05/12/2015  Component Date Value Ref Range Status  . Free T4 05/12/2015 1.25  0.60 - 1.60 ng/dL Final  . TSH 05/12/2015 0.99  0.35 - 4.50 uIU/mL Final   Labs are great!

## 2015-05-12 NOTE — Patient Instructions (Addendum)
Please stop at the lab.  Please continue Synthroid 125 mcg daily.  Take the thyroid hormone every day, with water, at least 30 minutes before breakfast, separated by at least 4 hours from: - acid reflux medications - calcium - iron - multivitamins  Please return in 1 year.  

## 2015-05-12 NOTE — Progress Notes (Signed)
Pre visit review using our clinic review tool, if applicable. No additional management support is needed unless otherwise documented below in the visit note. 

## 2015-05-22 DIAGNOSIS — F3181 Bipolar II disorder: Secondary | ICD-10-CM | POA: Diagnosis not present

## 2015-05-23 DIAGNOSIS — Z5181 Encounter for therapeutic drug level monitoring: Secondary | ICD-10-CM | POA: Diagnosis not present

## 2015-05-23 DIAGNOSIS — D239 Other benign neoplasm of skin, unspecified: Secondary | ICD-10-CM | POA: Diagnosis not present

## 2015-05-23 DIAGNOSIS — L650 Telogen effluvium: Secondary | ICD-10-CM | POA: Diagnosis not present

## 2015-05-23 DIAGNOSIS — L659 Nonscarring hair loss, unspecified: Secondary | ICD-10-CM | POA: Diagnosis not present

## 2015-05-28 DIAGNOSIS — J029 Acute pharyngitis, unspecified: Secondary | ICD-10-CM | POA: Diagnosis not present

## 2015-06-09 ENCOUNTER — Other Ambulatory Visit: Payer: Self-pay | Admitting: Internal Medicine

## 2015-06-19 ENCOUNTER — Telehealth: Payer: Self-pay | Admitting: Internal Medicine

## 2015-06-19 ENCOUNTER — Other Ambulatory Visit: Payer: Self-pay | Admitting: *Deleted

## 2015-06-19 DIAGNOSIS — E890 Postprocedural hypothyroidism: Secondary | ICD-10-CM

## 2015-06-19 NOTE — Telephone Encounter (Signed)
Labs were normal 1 mo ago but we can repeat them: TSH, fT4

## 2015-06-19 NOTE — Telephone Encounter (Signed)
Please read message below and advise.  

## 2015-06-19 NOTE — Telephone Encounter (Signed)
Patient stated that her tsh was up, and is sweating profusely, feels like she need to have some labs drawn, please advise

## 2015-06-19 NOTE — Telephone Encounter (Signed)
Called pt and advised her. She scheduled lab appt.

## 2015-06-24 ENCOUNTER — Other Ambulatory Visit (INDEPENDENT_AMBULATORY_CARE_PROVIDER_SITE_OTHER): Payer: PPO

## 2015-06-24 DIAGNOSIS — E890 Postprocedural hypothyroidism: Secondary | ICD-10-CM | POA: Diagnosis not present

## 2015-06-24 LAB — T4, FREE: Free T4: 1.05 ng/dL (ref 0.60–1.60)

## 2015-06-24 LAB — TSH: TSH: 1.3 u[IU]/mL (ref 0.35–4.50)

## 2015-08-21 ENCOUNTER — Other Ambulatory Visit: Payer: Self-pay

## 2015-08-21 DIAGNOSIS — L659 Nonscarring hair loss, unspecified: Secondary | ICD-10-CM | POA: Diagnosis not present

## 2015-08-21 DIAGNOSIS — L820 Inflamed seborrheic keratosis: Secondary | ICD-10-CM | POA: Diagnosis not present

## 2015-08-21 DIAGNOSIS — D485 Neoplasm of uncertain behavior of skin: Secondary | ICD-10-CM | POA: Diagnosis not present

## 2015-08-26 DIAGNOSIS — L659 Nonscarring hair loss, unspecified: Secondary | ICD-10-CM | POA: Diagnosis not present

## 2015-09-03 DIAGNOSIS — H43811 Vitreous degeneration, right eye: Secondary | ICD-10-CM | POA: Diagnosis not present

## 2015-09-08 NOTE — Progress Notes (Signed)
Cardiology Office Note  Date: 09/09/2015   ID: [name], DOB 02/16/1945, MRN [id number]  PCP: Carrie Neighbors, MD  Primary Cardiologist: Carrie Lesches, MD   Chief Complaint  Patient presents with  . PSVT    History of Present Illness: [name] is a 70 y.o. female last seen in February. She presents for a routine follow-up visit. Continues to do very well, all in one brief episode of palpitations since last visit. She remains compliant with Cardizem CD 180 mg daily. She reports no change in stamina, no exertional chest pain or unusual shortness of breath. She has had no syncope. I reviewed her ECG today which shows sinus rhythm with right bundle branch block.  Past Medical History:  Diagnosis Date  . Allergic rhinitis   . B12 deficiency   . Bipolar disorder ([place])   . Essential hypertension, benign   . GERD (gastroesophageal reflux disease)   . Hashimoto's thyroiditis   . History of transient ischemic attack (TIA)    Or possibly migraine as well as right amaurosis fugax and ataxia - Dr. Erling [name]  . Hyperlipidemia   . PSVT (paroxysmal supraventricular tachycardia) (HCC)     Current Outpatient Prescriptions  Medication Sig Dispense Refill  . aspirin EC 81 MG tablet Take 81 mg by mouth daily.    . Biotin 5000 MCG TABS Take 5,000 mcg by mouth daily.     Marland Kitchen buPROPion (WELLBUTRIN XL) 300 MG 24 hr tablet Take 300 mg by mouth daily.     . Cholecalciferol (VITAMIN D3) 1000 UNITS CAPS Take 2 capsules by mouth daily.    . clonazePAM (KLONOPIN) 1 MG tablet Take 1 mg by mouth at bedtime. May take up to 3 times daily as needed for anxiety    . cyanocobalamin (,VITAMIN B-12,) 1000 MCG/ML injection Inject 1,000 mcg into the muscle every 30 (thirty) days.     Marland Kitchen dexlansoprazole (DEXILANT) 60 MG capsule Take 60 mg by mouth at bedtime.     Marland Kitchen diltiazem (CARDIZEM CD) 180 MG 24 hr capsule TAKE (1) CAPSULE BY MOUTH ONCE DAILY. 30 capsule 11  . DULoxetine (CYMBALTA) 60 MG capsule Take 60 mg  by mouth 2 (two) times daily.     . folic acid (FOLVITE) 1 MG tablet Take 1 mg by mouth daily.    . hyoscyamine (LEVSIN, ANASPAZ) 0.125 MG tablet Take 0.125 mg by mouth every 4 (four) hours as needed for cramping.    Marland Kitchen losartan (COZAAR) 50 MG tablet Take 50 mg by mouth daily.    . Probiotic Product (PROBIOTIC DAILY) CAPS Take 1 capsule by mouth daily.    . promethazine (PHENERGAN) 25 MG tablet Take 25 mg by mouth every 6 (six) hours as needed for nausea.    Marland Kitchen pyridOXINE (VITAMIN B-6) 100 MG tablet Take 100 mg by mouth daily.    Marland Kitchen SYNTHROID 125 MCG tablet TAKE 1 TABLET BY MOUTH DAILY BEFORE BREAKFAST. 30 tablet 0   No current facility-administered medications for this visit.    Allergies:  Penicillins; Amoxicillin-pot clavulanate; Atenolol; and Sulfonamide derivatives   Social History: The patient  reports that she quit smoking about 32 years ago. Her smoking use included Cigarettes. She has a 5.00 pack-year smoking history. She has never used smokeless tobacco. She reports that she does not drink alcohol or use drugs.   ROS:  Please see the history of present illness. Otherwise, complete review of systems is positive for none.  All other systems are reviewed and  negative.   Physical Exam: VS:  BP (!) 146/76   Pulse 73   Ht 5' 1.5" (1.562 m)   Wt 163 lb (73.9 kg)   SpO2 97%   BMI 30.30 kg/m , BMI Body mass index is 30.3 kg/m.  Wt Readings from Last 3 Encounters:  09/09/15 163 lb (73.9 kg)  05/12/15 158 lb 8 oz (71.9 kg)  03/10/15 167 lb (75.8 kg)    Appears comfortable at rest.  HEENT: Conjunctiva and lids normal, oropharynx with moist mucosa.  Neck: Supple, no elevated JVP or carotid bruits.  Lungs: Clear to auscultation, nonlabored.  Cardiac: Regular rate and rhythm, soft systolic murmur at the base with preserved second heart sound, no pericardial rub or S3 gallop.  Abdomen: Soft, nontender, bowel sounds present.  Extremities: No pitting edema, distal pulses  full.  ECG: I personally reviewed the tracing from 07/05/2014 which showed normal sinus rhythm with incomplete right bundle branch block.  Recent Labwork: 06/24/2015: TSH 1.30  07/04/2014: ALT 16; AST 19 07/23/2014: BUN 9; Creat 0.62; Hemoglobin 12.5; Magnesium 2.2; Platelets 487*; Potassium 4.3; Sodium 136  Other Studies Reviewed Today:  Echocardiogram 02/11/2011: Study Conclusions  - Left ventricle: The cavity size was normal. Wall thickness was at the upper limits of normal. Systolic function was vigorous. The estimated ejection fraction was in the range of 65% to 70%. Wall motion was normal; there were no regional wall motion abnormalities. The study is not technically sufficient to allow evaluation of LV diastolic function. - Mitral valve: Trivial regurgitation. - Atrial septum: No clear defect or patent foramen ovale was identified. - Tricuspid valve: Mild regurgitation. - Pulmonary arteries: PA peak pressure: 75mm Hg (S). - Pericardium, extracardiac: There was no pericardial effusion.  Assessment and Plan:  1. PSVT, symptomatically stable on current dose of Cardizem CD. Continue observation.  2. Essential hypertension. Keep follow-up with Dr. [place] [name].  Current medicines were reviewed with the patient today.   Orders Placed This Encounter  Procedures  . EKG 12-Lead    Disposition: Follow-up with me in one year.  Signed, Satira Sark, MD, [facility] 09/09/2015 11:18 AM    [place] at [facility] [address], [place], [place] [address] Phone: [phone]; Fax: [phone]

## 2015-09-09 ENCOUNTER — Ambulatory Visit (INDEPENDENT_AMBULATORY_CARE_PROVIDER_SITE_OTHER): Payer: PPO | Admitting: Cardiology

## 2015-09-09 ENCOUNTER — Encounter: Payer: Self-pay | Admitting: Cardiology

## 2015-09-09 VITALS — BP 146/76 | HR 73 | Ht 61.5 in | Wt 163.0 lb

## 2015-09-09 DIAGNOSIS — I471 Supraventricular tachycardia: Secondary | ICD-10-CM | POA: Diagnosis not present

## 2015-09-09 DIAGNOSIS — I10 Essential (primary) hypertension: Secondary | ICD-10-CM | POA: Diagnosis not present

## 2015-09-09 NOTE — Patient Instructions (Signed)

## 2015-09-15 ENCOUNTER — Other Ambulatory Visit: Payer: Self-pay | Admitting: Adult Health

## 2015-09-23 DIAGNOSIS — E782 Mixed hyperlipidemia: Secondary | ICD-10-CM | POA: Diagnosis not present

## 2015-09-23 DIAGNOSIS — D510 Vitamin B12 deficiency anemia due to intrinsic factor deficiency: Secondary | ICD-10-CM | POA: Diagnosis not present

## 2015-09-23 DIAGNOSIS — E039 Hypothyroidism, unspecified: Secondary | ICD-10-CM | POA: Diagnosis not present

## 2015-09-25 DIAGNOSIS — Z23 Encounter for immunization: Secondary | ICD-10-CM | POA: Diagnosis not present

## 2015-09-25 DIAGNOSIS — E039 Hypothyroidism, unspecified: Secondary | ICD-10-CM | POA: Diagnosis not present

## 2015-09-25 DIAGNOSIS — I10 Essential (primary) hypertension: Secondary | ICD-10-CM | POA: Diagnosis not present

## 2015-09-25 DIAGNOSIS — E782 Mixed hyperlipidemia: Secondary | ICD-10-CM | POA: Diagnosis not present

## 2015-09-25 DIAGNOSIS — L659 Nonscarring hair loss, unspecified: Secondary | ICD-10-CM | POA: Diagnosis not present

## 2015-09-25 DIAGNOSIS — F411 Generalized anxiety disorder: Secondary | ICD-10-CM | POA: Diagnosis not present

## 2015-09-25 DIAGNOSIS — K589 Irritable bowel syndrome without diarrhea: Secondary | ICD-10-CM | POA: Diagnosis not present

## 2015-09-25 DIAGNOSIS — D509 Iron deficiency anemia, unspecified: Secondary | ICD-10-CM | POA: Diagnosis not present

## 2015-09-25 DIAGNOSIS — F339 Major depressive disorder, recurrent, unspecified: Secondary | ICD-10-CM | POA: Diagnosis not present

## 2015-10-24 DIAGNOSIS — E039 Hypothyroidism, unspecified: Secondary | ICD-10-CM | POA: Diagnosis not present

## 2015-10-24 DIAGNOSIS — D509 Iron deficiency anemia, unspecified: Secondary | ICD-10-CM | POA: Diagnosis not present

## 2015-10-24 DIAGNOSIS — D649 Anemia, unspecified: Secondary | ICD-10-CM | POA: Diagnosis not present

## 2015-10-24 DIAGNOSIS — R5383 Other fatigue: Secondary | ICD-10-CM | POA: Diagnosis not present

## 2015-11-20 DIAGNOSIS — F3181 Bipolar II disorder: Secondary | ICD-10-CM | POA: Diagnosis not present

## 2016-01-22 DIAGNOSIS — Z0000 Encounter for general adult medical examination without abnormal findings: Secondary | ICD-10-CM | POA: Diagnosis not present

## 2016-02-04 DIAGNOSIS — M25561 Pain in right knee: Secondary | ICD-10-CM | POA: Diagnosis not present

## 2016-02-04 DIAGNOSIS — M170 Bilateral primary osteoarthritis of knee: Secondary | ICD-10-CM | POA: Diagnosis not present

## 2016-02-04 DIAGNOSIS — M1711 Unilateral primary osteoarthritis, right knee: Secondary | ICD-10-CM | POA: Diagnosis not present

## 2016-02-04 DIAGNOSIS — M1712 Unilateral primary osteoarthritis, left knee: Secondary | ICD-10-CM | POA: Diagnosis not present

## 2016-03-18 ENCOUNTER — Other Ambulatory Visit: Payer: Self-pay

## 2016-03-18 DIAGNOSIS — L659 Nonscarring hair loss, unspecified: Secondary | ICD-10-CM | POA: Diagnosis not present

## 2016-03-18 DIAGNOSIS — L728 Other follicular cysts of the skin and subcutaneous tissue: Secondary | ICD-10-CM | POA: Diagnosis not present

## 2016-03-18 DIAGNOSIS — D170 Benign lipomatous neoplasm of skin and subcutaneous tissue of head, face and neck: Secondary | ICD-10-CM | POA: Diagnosis not present

## 2016-03-18 DIAGNOSIS — D492 Neoplasm of unspecified behavior of bone, soft tissue, and skin: Secondary | ICD-10-CM | POA: Diagnosis not present

## 2016-03-18 DIAGNOSIS — D2261 Melanocytic nevi of right upper limb, including shoulder: Secondary | ICD-10-CM | POA: Diagnosis not present

## 2016-03-19 DIAGNOSIS — M170 Bilateral primary osteoarthritis of knee: Secondary | ICD-10-CM | POA: Diagnosis not present

## 2016-03-26 DIAGNOSIS — M170 Bilateral primary osteoarthritis of knee: Secondary | ICD-10-CM | POA: Diagnosis not present

## 2016-04-02 DIAGNOSIS — M170 Bilateral primary osteoarthritis of knee: Secondary | ICD-10-CM | POA: Diagnosis not present

## 2016-04-09 DIAGNOSIS — E039 Hypothyroidism, unspecified: Secondary | ICD-10-CM | POA: Diagnosis not present

## 2016-04-09 DIAGNOSIS — D509 Iron deficiency anemia, unspecified: Secondary | ICD-10-CM | POA: Diagnosis not present

## 2016-04-19 DIAGNOSIS — Z1159 Encounter for screening for other viral diseases: Secondary | ICD-10-CM | POA: Diagnosis not present

## 2016-04-19 DIAGNOSIS — I10 Essential (primary) hypertension: Secondary | ICD-10-CM | POA: Diagnosis not present

## 2016-04-19 DIAGNOSIS — E039 Hypothyroidism, unspecified: Secondary | ICD-10-CM | POA: Diagnosis not present

## 2016-04-19 DIAGNOSIS — D509 Iron deficiency anemia, unspecified: Secondary | ICD-10-CM | POA: Diagnosis not present

## 2016-04-22 DIAGNOSIS — F339 Major depressive disorder, recurrent, unspecified: Secondary | ICD-10-CM | POA: Diagnosis not present

## 2016-04-22 DIAGNOSIS — F411 Generalized anxiety disorder: Secondary | ICD-10-CM | POA: Diagnosis not present

## 2016-04-22 DIAGNOSIS — K589 Irritable bowel syndrome without diarrhea: Secondary | ICD-10-CM | POA: Diagnosis not present

## 2016-04-22 DIAGNOSIS — E782 Mixed hyperlipidemia: Secondary | ICD-10-CM | POA: Diagnosis not present

## 2016-04-22 DIAGNOSIS — L659 Nonscarring hair loss, unspecified: Secondary | ICD-10-CM | POA: Diagnosis not present

## 2016-04-22 DIAGNOSIS — D519 Vitamin B12 deficiency anemia, unspecified: Secondary | ICD-10-CM | POA: Diagnosis not present

## 2016-04-22 DIAGNOSIS — I10 Essential (primary) hypertension: Secondary | ICD-10-CM | POA: Diagnosis not present

## 2016-04-22 DIAGNOSIS — K5900 Constipation, unspecified: Secondary | ICD-10-CM | POA: Diagnosis not present

## 2016-04-22 DIAGNOSIS — E039 Hypothyroidism, unspecified: Secondary | ICD-10-CM | POA: Diagnosis not present

## 2016-05-11 ENCOUNTER — Ambulatory Visit: Payer: PPO | Admitting: Internal Medicine

## 2016-05-17 DIAGNOSIS — D229 Melanocytic nevi, unspecified: Secondary | ICD-10-CM | POA: Diagnosis not present

## 2016-05-17 DIAGNOSIS — L659 Nonscarring hair loss, unspecified: Secondary | ICD-10-CM | POA: Diagnosis not present

## 2016-05-21 DIAGNOSIS — R5383 Other fatigue: Secondary | ICD-10-CM | POA: Diagnosis not present

## 2016-05-21 DIAGNOSIS — F339 Major depressive disorder, recurrent, unspecified: Secondary | ICD-10-CM | POA: Diagnosis not present

## 2016-05-21 DIAGNOSIS — D649 Anemia, unspecified: Secondary | ICD-10-CM | POA: Diagnosis not present

## 2016-05-21 DIAGNOSIS — L659 Nonscarring hair loss, unspecified: Secondary | ICD-10-CM | POA: Diagnosis not present

## 2016-05-21 DIAGNOSIS — I10 Essential (primary) hypertension: Secondary | ICD-10-CM | POA: Diagnosis not present

## 2016-05-21 DIAGNOSIS — E039 Hypothyroidism, unspecified: Secondary | ICD-10-CM | POA: Diagnosis not present

## 2016-05-21 DIAGNOSIS — Z6831 Body mass index (BMI) 31.0-31.9, adult: Secondary | ICD-10-CM | POA: Diagnosis not present

## 2016-05-21 DIAGNOSIS — D519 Vitamin B12 deficiency anemia, unspecified: Secondary | ICD-10-CM | POA: Diagnosis not present

## 2016-05-21 DIAGNOSIS — F411 Generalized anxiety disorder: Secondary | ICD-10-CM | POA: Diagnosis not present

## 2016-05-27 DIAGNOSIS — M1711 Unilateral primary osteoarthritis, right knee: Secondary | ICD-10-CM | POA: Diagnosis not present

## 2016-05-27 DIAGNOSIS — M1712 Unilateral primary osteoarthritis, left knee: Secondary | ICD-10-CM | POA: Diagnosis not present

## 2016-06-02 DIAGNOSIS — Z6832 Body mass index (BMI) 32.0-32.9, adult: Secondary | ICD-10-CM | POA: Diagnosis not present

## 2016-06-02 DIAGNOSIS — M79642 Pain in left hand: Secondary | ICD-10-CM | POA: Diagnosis not present

## 2016-06-02 DIAGNOSIS — M25522 Pain in left elbow: Secondary | ICD-10-CM | POA: Diagnosis not present

## 2016-06-02 DIAGNOSIS — S0180XA Unspecified open wound of other part of head, initial encounter: Secondary | ICD-10-CM | POA: Diagnosis not present

## 2016-06-17 DIAGNOSIS — F332 Major depressive disorder, recurrent severe without psychotic features: Secondary | ICD-10-CM | POA: Diagnosis not present

## 2016-06-29 DIAGNOSIS — N760 Acute vaginitis: Secondary | ICD-10-CM | POA: Diagnosis not present

## 2016-06-29 DIAGNOSIS — Z1231 Encounter for screening mammogram for malignant neoplasm of breast: Secondary | ICD-10-CM | POA: Diagnosis not present

## 2016-07-07 ENCOUNTER — Encounter (HOSPITAL_COMMUNITY)
Admission: RE | Admit: 2016-07-07 | Discharge: 2016-07-07 | Disposition: A | Discharge status: Home / Self Care | Payer: PPO | Source: Ambulatory Visit | Attending: Internal Medicine | Admitting: Internal Medicine

## 2016-07-07 ENCOUNTER — Encounter (HOSPITAL_COMMUNITY): Payer: Self-pay

## 2016-07-07 DIAGNOSIS — D509 Iron deficiency anemia, unspecified: Secondary | ICD-10-CM | POA: Diagnosis not present

## 2016-07-07 MED ORDER — FERRIC CARBOXYMALTOSE 750 MG/15ML IV SOLN
750.0000 mg | Freq: Once | INTRAVENOUS | Status: AC
  Administered 2016-07-07: 750 mg via INTRAVENOUS
  Filled 2016-07-07: qty 15

## 2016-07-07 MED ORDER — SODIUM CHLORIDE 0.9 % IV SOLN
Freq: Once | INTRAVENOUS | Status: AC
  Administered 2016-07-07: 12:00:00 via INTRAVENOUS

## 2016-07-14 ENCOUNTER — Encounter (HOSPITAL_COMMUNITY)
Admission: RE | Admit: 2016-07-14 | Discharge: 2016-07-14 | Disposition: A | Discharge status: Home / Self Care | Payer: PPO | Source: Ambulatory Visit | Attending: Internal Medicine | Admitting: Internal Medicine

## 2016-07-14 DIAGNOSIS — D509 Iron deficiency anemia, unspecified: Secondary | ICD-10-CM | POA: Diagnosis not present

## 2016-07-14 MED ORDER — SODIUM CHLORIDE 0.9 % IV SOLN
INTRAVENOUS | Status: DC
  Administered 2016-07-14: 250 mL via INTRAVENOUS

## 2016-07-14 MED ORDER — SODIUM CHLORIDE 0.9 % IV SOLN
750.0000 mg | Freq: Once | INTRAVENOUS | Status: AC
  Administered 2016-07-14: 750 mg via INTRAVENOUS
  Filled 2016-07-14: qty 15

## 2016-07-31 DIAGNOSIS — J449 Chronic obstructive pulmonary disease, unspecified: Secondary | ICD-10-CM | POA: Diagnosis not present

## 2016-07-31 DIAGNOSIS — J4540 Moderate persistent asthma, uncomplicated: Secondary | ICD-10-CM | POA: Diagnosis not present

## 2016-08-12 ENCOUNTER — Other Ambulatory Visit: Payer: Self-pay | Admitting: Cardiology

## 2016-09-06 DIAGNOSIS — H524 Presbyopia: Secondary | ICD-10-CM | POA: Diagnosis not present

## 2016-09-06 DIAGNOSIS — H52223 Regular astigmatism, bilateral: Secondary | ICD-10-CM | POA: Diagnosis not present

## 2016-09-06 DIAGNOSIS — H5203 Hypermetropia, bilateral: Secondary | ICD-10-CM | POA: Diagnosis not present

## 2016-09-06 DIAGNOSIS — H43811 Vitreous degeneration, right eye: Secondary | ICD-10-CM | POA: Diagnosis not present

## 2016-09-14 NOTE — Progress Notes (Signed)
Cardiology Office Note  Date: 09/15/2016   ID: Tava, Peery 1945/11/18, MRN [id number]  PCP: Celene Squibb, MD  Primary Cardiologist: Rozann Lesches, MD   Chief Complaint  Patient presents with  . PSVT    History of Present Illness: [name] is a 71 y.o. female last seen in August 2017. She presents for a routine follow-up visit. Reports intermittent brief palpitations but no prolonged episodes, no lightheadedness, chest pain, or syncope.   I personally reviewed her ECG today which shows sinus rhythm with right bundle branch block.  We would over her medications. She continues on aspirin and Cardizem CD at 180 mg daily. She is also on Synthroid and follows thyroid with Dr. Nevada Crane.  Past Medical History:  Diagnosis Date  . Allergic rhinitis   . B12 deficiency   . Bipolar disorder ([place])   . Essential hypertension, benign   . GERD (gastroesophageal reflux disease)   . Hashimoto's thyroiditis   . History of transient ischemic attack (TIA)    Or possibly migraine as well as right amaurosis fugax and ataxia - Dr. Erling Cruz  . Hyperlipidemia   . PSVT (paroxysmal supraventricular tachycardia) ([place])     Past Surgical History:  Procedure Laterality Date  . LAPAROSCOPIC CHOLECYSTECTOMY  2010  . THYROIDECTOMY  2009    Current Outpatient Prescriptions  Medication Sig Dispense Refill  . aspirin EC 81 MG tablet Take 81 mg by mouth daily.    . Biotin 5000 MCG TABS Take 5,000 mcg by mouth daily.     Marland Kitchen buPROPion (WELLBUTRIN XL) 300 MG 24 hr tablet Take 300 mg by mouth daily.     . Cholecalciferol (VITAMIN D3) 1000 UNITS CAPS Take 2 capsules by mouth daily.    . clonazePAM (KLONOPIN) 1 MG tablet Take 1 mg by mouth at bedtime. May take up to 3 times daily as needed for anxiety    . cyanocobalamin (,VITAMIN B-12,) 1000 MCG/ML injection Inject 1,000 mcg into the muscle every 30 (thirty) days.     Marland Kitchen dexlansoprazole (DEXILANT) 60 MG capsule Take 60 mg by mouth at bedtime.     Marland Kitchen  diltiazem (CARDIZEM CD) 180 MG 24 hr capsule TAKE (1) CAPSULE BY MOUTH ONCE DAILY. 30 capsule 6  . DULoxetine (CYMBALTA) 60 MG capsule Take 60 mg by mouth 2 (two) times daily.     . folic acid (FOLVITE) 1 MG tablet Take 1 mg by mouth daily.    . hyoscyamine (LEVSIN, ANASPAZ) 0.125 MG tablet Take 0.125 mg by mouth every 4 (four) hours as needed for cramping.    Marland Kitchen losartan (COZAAR) 50 MG tablet Take 50 mg by mouth daily.    . Probiotic Product (PROBIOTIC DAILY) CAPS Take 1 capsule by mouth daily.    . promethazine (PHENERGAN) 25 MG tablet Take 25 mg by mouth every 6 (six) hours as needed for nausea.    Marland Kitchen pyridOXINE (VITAMIN B-6) 100 MG tablet Take 100 mg by mouth daily.    Marland Kitchen SYNTHROID 125 MCG tablet TAKE 1 TABLET BY MOUTH DAILY BEFORE BREAKFAST. 30 tablet 0   No current facility-administered medications for this visit.    Allergies:  Penicillins; Amoxicillin-pot clavulanate; Atenolol; and Sulfonamide derivatives   Social History: The patient  reports that she quit smoking about 33 years ago. Her smoking use included Cigarettes. She has a 5.00 pack-year smoking history. She has never used smokeless tobacco. She reports that she does not drink alcohol or use drugs.   ROS:  Please see the history of present illness. Otherwise, complete review of systems is positive for none.  All other systems are reviewed and negative.   Physical Exam: VS:  BP 118/68   Pulse 79   Ht 5\' 2"  (1.575 m)   Wt 172 lb (78 kg)   SpO2 95%   BMI 31.46 kg/m , BMI Body mass index is 31.46 kg/m.  Wt Readings from Last 3 Encounters:  09/15/16 172 lb (78 kg)  09/09/15 163 lb (73.9 kg)  05/12/15 158 lb 8 oz (71.9 kg)    Appears comfortable at rest.  HEENT: Conjunctiva and lids normal, oropharynx with moist mucosa.  Neck: Supple, no elevated JVP or carotid bruits.  Lungs: Clear to auscultation, nonlabored.  Cardiac: Regular rate and rhythm, soft systolic murmur at the base with preserved second heart sound, no  pericardial rub or S3 gallop.  Abdomen: Soft, nontender, bowel sounds present.  Extremities: No pitting edema, distal pulses full.  ECG: I personally reviewed the tracing from 09/09/2015 which showed sinus rhythm with right bundle branch block.  Recent Labwork:  06/24/2015: TSH 1.30   Other Studies Reviewed Today:  Echocardiogram 02/11/2011: Study Conclusions  - Left ventricle: The cavity size was normal. Wall thickness was at the upper limits of normal. Systolic function was vigorous. The estimated ejection fraction was in the range of 65% to 70%. Wall motion was normal; there were no regional wall motion abnormalities. The study is not technically sufficient to allow evaluation of LV diastolic function. - Mitral valve: Trivial regurgitation. - Atrial septum: No clear defect or patent foramen ovale was identified. - Tricuspid valve: Mild regurgitation. - Pulmonary arteries: PA peak pressure: 41mm Hg (S). - Pericardium, extracardiac: There was no pericardial effusion.  Assessment and Plan:  1. PSVT, symptomatically controlled on current dose of Cardizem CD. Not report any progressive palpitations and we will continue with observation at this point. ECG reviewed.  2. Hypothyroidism, on Synthroid with follow-up per Dr. Nevada Crane.  Current medicines were reviewed with the patient today.   Orders Placed This Encounter  Procedures  . EKG 12-Lead    Disposition: Follow-up in one year.  Signed, Satira Sark, MD, [facility] 09/15/2016 1:27 PM    [facility] at [facility] [address], [place], [place] [address] Phone: [phone]; Fax: [phone]

## 2016-09-15 ENCOUNTER — Encounter: Payer: Self-pay | Admitting: Cardiology

## 2016-09-15 ENCOUNTER — Ambulatory Visit (INDEPENDENT_AMBULATORY_CARE_PROVIDER_SITE_OTHER): Payer: PPO | Admitting: Cardiology

## 2016-09-15 VITALS — BP 118/68 | HR 79 | Ht 62.0 in | Wt 172.0 lb

## 2016-09-15 DIAGNOSIS — E039 Hypothyroidism, unspecified: Secondary | ICD-10-CM

## 2016-09-15 DIAGNOSIS — I471 Supraventricular tachycardia, unspecified: Secondary | ICD-10-CM

## 2016-09-15 NOTE — Patient Instructions (Signed)
Your physician wants you to follow-up in: 1 year with Dr.McDowell You will receive a reminder letter in the mail two months in advance. If you don't receive a letter, please call our office to schedule the follow-up appointment.      Your physician recommends that you continue on your current medications as directed. Please refer to the Current Medication list given to you today.    If you need a refill on your cardiac medications before your next appointment, please call your pharmacy.      No lab work or testing ordered today.        Thank you for choosing [facility] !          

## 2016-09-29 DIAGNOSIS — N952 Postmenopausal atrophic vaginitis: Secondary | ICD-10-CM | POA: Diagnosis not present

## 2016-09-29 DIAGNOSIS — R39198 Other difficulties with micturition: Secondary | ICD-10-CM | POA: Diagnosis not present

## 2016-09-29 DIAGNOSIS — Z01419 Encounter for gynecological examination (general) (routine) without abnormal findings: Secondary | ICD-10-CM | POA: Diagnosis not present

## 2016-10-01 DIAGNOSIS — J449 Chronic obstructive pulmonary disease, unspecified: Secondary | ICD-10-CM | POA: Diagnosis not present

## 2016-10-01 DIAGNOSIS — J4540 Moderate persistent asthma, uncomplicated: Secondary | ICD-10-CM | POA: Diagnosis not present

## 2016-11-02 DIAGNOSIS — D509 Iron deficiency anemia, unspecified: Secondary | ICD-10-CM | POA: Diagnosis not present

## 2016-11-02 DIAGNOSIS — E039 Hypothyroidism, unspecified: Secondary | ICD-10-CM | POA: Diagnosis not present

## 2016-11-02 DIAGNOSIS — Z23 Encounter for immunization: Secondary | ICD-10-CM | POA: Diagnosis not present

## 2016-11-16 DIAGNOSIS — E039 Hypothyroidism, unspecified: Secondary | ICD-10-CM | POA: Diagnosis not present

## 2016-11-16 DIAGNOSIS — D509 Iron deficiency anemia, unspecified: Secondary | ICD-10-CM | POA: Diagnosis not present

## 2016-12-09 DIAGNOSIS — F3181 Bipolar II disorder: Secondary | ICD-10-CM | POA: Diagnosis not present

## 2016-12-22 DIAGNOSIS — D509 Iron deficiency anemia, unspecified: Secondary | ICD-10-CM | POA: Diagnosis not present

## 2016-12-22 DIAGNOSIS — D519 Vitamin B12 deficiency anemia, unspecified: Secondary | ICD-10-CM | POA: Diagnosis not present

## 2016-12-22 DIAGNOSIS — D6949 Other primary thrombocytopenia: Secondary | ICD-10-CM | POA: Diagnosis not present

## 2016-12-22 DIAGNOSIS — E782 Mixed hyperlipidemia: Secondary | ICD-10-CM | POA: Diagnosis not present

## 2016-12-22 DIAGNOSIS — E039 Hypothyroidism, unspecified: Secondary | ICD-10-CM | POA: Diagnosis not present

## 2016-12-22 DIAGNOSIS — I10 Essential (primary) hypertension: Secondary | ICD-10-CM | POA: Diagnosis not present

## 2016-12-22 DIAGNOSIS — R270 Ataxia, unspecified: Secondary | ICD-10-CM | POA: Diagnosis not present

## 2016-12-22 DIAGNOSIS — L659 Nonscarring hair loss, unspecified: Secondary | ICD-10-CM | POA: Diagnosis not present

## 2016-12-22 DIAGNOSIS — E871 Hypo-osmolality and hyponatremia: Secondary | ICD-10-CM | POA: Diagnosis not present

## 2016-12-22 DIAGNOSIS — F339 Major depressive disorder, recurrent, unspecified: Secondary | ICD-10-CM | POA: Diagnosis not present

## 2016-12-22 DIAGNOSIS — F411 Generalized anxiety disorder: Secondary | ICD-10-CM | POA: Diagnosis not present

## 2016-12-22 DIAGNOSIS — K589 Irritable bowel syndrome without diarrhea: Secondary | ICD-10-CM | POA: Diagnosis not present

## 2016-12-29 DIAGNOSIS — M25562 Pain in left knee: Secondary | ICD-10-CM | POA: Diagnosis not present

## 2016-12-29 DIAGNOSIS — I10 Essential (primary) hypertension: Secondary | ICD-10-CM | POA: Diagnosis not present

## 2016-12-29 DIAGNOSIS — L659 Nonscarring hair loss, unspecified: Secondary | ICD-10-CM | POA: Diagnosis not present

## 2016-12-29 DIAGNOSIS — D519 Vitamin B12 deficiency anemia, unspecified: Secondary | ICD-10-CM | POA: Diagnosis not present

## 2016-12-29 DIAGNOSIS — E039 Hypothyroidism, unspecified: Secondary | ICD-10-CM | POA: Diagnosis not present

## 2016-12-29 DIAGNOSIS — F339 Major depressive disorder, recurrent, unspecified: Secondary | ICD-10-CM | POA: Diagnosis not present

## 2016-12-29 DIAGNOSIS — K589 Irritable bowel syndrome without diarrhea: Secondary | ICD-10-CM | POA: Diagnosis not present

## 2016-12-29 DIAGNOSIS — Z6832 Body mass index (BMI) 32.0-32.9, adult: Secondary | ICD-10-CM | POA: Diagnosis not present

## 2016-12-29 DIAGNOSIS — K5900 Constipation, unspecified: Secondary | ICD-10-CM | POA: Diagnosis not present

## 2016-12-29 DIAGNOSIS — E782 Mixed hyperlipidemia: Secondary | ICD-10-CM | POA: Diagnosis not present

## 2016-12-29 DIAGNOSIS — F411 Generalized anxiety disorder: Secondary | ICD-10-CM | POA: Diagnosis not present

## 2017-02-08 DIAGNOSIS — D519 Vitamin B12 deficiency anemia, unspecified: Secondary | ICD-10-CM | POA: Diagnosis not present

## 2017-02-08 DIAGNOSIS — L659 Nonscarring hair loss, unspecified: Secondary | ICD-10-CM | POA: Diagnosis not present

## 2017-02-08 DIAGNOSIS — D509 Iron deficiency anemia, unspecified: Secondary | ICD-10-CM | POA: Diagnosis not present

## 2017-02-08 DIAGNOSIS — E039 Hypothyroidism, unspecified: Secondary | ICD-10-CM | POA: Diagnosis not present

## 2017-03-21 ENCOUNTER — Other Ambulatory Visit: Payer: Self-pay | Admitting: Cardiology

## 2017-03-30 DIAGNOSIS — D519 Vitamin B12 deficiency anemia, unspecified: Secondary | ICD-10-CM | POA: Diagnosis not present

## 2017-03-30 DIAGNOSIS — F339 Major depressive disorder, recurrent, unspecified: Secondary | ICD-10-CM | POA: Diagnosis not present

## 2017-03-30 DIAGNOSIS — L659 Nonscarring hair loss, unspecified: Secondary | ICD-10-CM | POA: Diagnosis not present

## 2017-03-30 DIAGNOSIS — D509 Iron deficiency anemia, unspecified: Secondary | ICD-10-CM | POA: Diagnosis not present

## 2017-03-30 DIAGNOSIS — K589 Irritable bowel syndrome without diarrhea: Secondary | ICD-10-CM | POA: Diagnosis not present

## 2017-03-30 DIAGNOSIS — D6949 Other primary thrombocytopenia: Secondary | ICD-10-CM | POA: Diagnosis not present

## 2017-03-30 DIAGNOSIS — R270 Ataxia, unspecified: Secondary | ICD-10-CM | POA: Diagnosis not present

## 2017-03-30 DIAGNOSIS — I10 Essential (primary) hypertension: Secondary | ICD-10-CM | POA: Diagnosis not present

## 2017-03-30 DIAGNOSIS — E0590 Thyrotoxicosis, unspecified without thyrotoxic crisis or storm: Secondary | ICD-10-CM | POA: Diagnosis not present

## 2017-03-30 DIAGNOSIS — R5383 Other fatigue: Secondary | ICD-10-CM | POA: Diagnosis not present

## 2017-03-30 DIAGNOSIS — E039 Hypothyroidism, unspecified: Secondary | ICD-10-CM | POA: Diagnosis not present

## 2017-03-30 DIAGNOSIS — F411 Generalized anxiety disorder: Secondary | ICD-10-CM | POA: Diagnosis not present

## 2017-03-30 DIAGNOSIS — E871 Hypo-osmolality and hyponatremia: Secondary | ICD-10-CM | POA: Diagnosis not present

## 2017-03-30 DIAGNOSIS — E782 Mixed hyperlipidemia: Secondary | ICD-10-CM | POA: Diagnosis not present

## 2017-03-30 DIAGNOSIS — Z6831 Body mass index (BMI) 31.0-31.9, adult: Secondary | ICD-10-CM | POA: Diagnosis not present

## 2017-05-03 DIAGNOSIS — F411 Generalized anxiety disorder: Secondary | ICD-10-CM | POA: Diagnosis not present

## 2017-05-03 DIAGNOSIS — K589 Irritable bowel syndrome without diarrhea: Secondary | ICD-10-CM | POA: Diagnosis not present

## 2017-05-03 DIAGNOSIS — D6949 Other primary thrombocytopenia: Secondary | ICD-10-CM | POA: Diagnosis not present

## 2017-05-03 DIAGNOSIS — E871 Hypo-osmolality and hyponatremia: Secondary | ICD-10-CM | POA: Diagnosis not present

## 2017-05-03 DIAGNOSIS — R270 Ataxia, unspecified: Secondary | ICD-10-CM | POA: Diagnosis not present

## 2017-05-03 DIAGNOSIS — D509 Iron deficiency anemia, unspecified: Secondary | ICD-10-CM | POA: Diagnosis not present

## 2017-05-03 DIAGNOSIS — E782 Mixed hyperlipidemia: Secondary | ICD-10-CM | POA: Diagnosis not present

## 2017-05-03 DIAGNOSIS — D519 Vitamin B12 deficiency anemia, unspecified: Secondary | ICD-10-CM | POA: Diagnosis not present

## 2017-05-03 DIAGNOSIS — F339 Major depressive disorder, recurrent, unspecified: Secondary | ICD-10-CM | POA: Diagnosis not present

## 2017-05-03 DIAGNOSIS — E039 Hypothyroidism, unspecified: Secondary | ICD-10-CM | POA: Diagnosis not present

## 2017-05-03 DIAGNOSIS — L659 Nonscarring hair loss, unspecified: Secondary | ICD-10-CM | POA: Diagnosis not present

## 2017-05-03 DIAGNOSIS — I10 Essential (primary) hypertension: Secondary | ICD-10-CM | POA: Diagnosis not present

## 2017-05-31 DIAGNOSIS — J309 Allergic rhinitis, unspecified: Secondary | ICD-10-CM | POA: Diagnosis not present

## 2017-05-31 DIAGNOSIS — M25562 Pain in left knee: Secondary | ICD-10-CM | POA: Diagnosis not present

## 2017-05-31 DIAGNOSIS — E039 Hypothyroidism, unspecified: Secondary | ICD-10-CM | POA: Diagnosis not present

## 2017-05-31 DIAGNOSIS — Z6832 Body mass index (BMI) 32.0-32.9, adult: Secondary | ICD-10-CM | POA: Diagnosis not present

## 2017-05-31 DIAGNOSIS — R5383 Other fatigue: Secondary | ICD-10-CM | POA: Diagnosis not present

## 2017-05-31 DIAGNOSIS — E782 Mixed hyperlipidemia: Secondary | ICD-10-CM | POA: Diagnosis not present

## 2017-07-08 DIAGNOSIS — E871 Hypo-osmolality and hyponatremia: Secondary | ICD-10-CM | POA: Diagnosis not present

## 2017-07-08 DIAGNOSIS — F339 Major depressive disorder, recurrent, unspecified: Secondary | ICD-10-CM | POA: Diagnosis not present

## 2017-07-08 DIAGNOSIS — E039 Hypothyroidism, unspecified: Secondary | ICD-10-CM | POA: Diagnosis not present

## 2017-07-08 DIAGNOSIS — D519 Vitamin B12 deficiency anemia, unspecified: Secondary | ICD-10-CM | POA: Diagnosis not present

## 2017-07-08 DIAGNOSIS — M25562 Pain in left knee: Secondary | ICD-10-CM | POA: Diagnosis not present

## 2017-07-08 DIAGNOSIS — I10 Essential (primary) hypertension: Secondary | ICD-10-CM | POA: Diagnosis not present

## 2017-07-08 DIAGNOSIS — E559 Vitamin D deficiency, unspecified: Secondary | ICD-10-CM | POA: Diagnosis not present

## 2017-07-27 DIAGNOSIS — F3181 Bipolar II disorder: Secondary | ICD-10-CM | POA: Diagnosis not present

## 2017-08-17 DIAGNOSIS — D519 Vitamin B12 deficiency anemia, unspecified: Secondary | ICD-10-CM | POA: Diagnosis not present

## 2017-08-17 DIAGNOSIS — K589 Irritable bowel syndrome without diarrhea: Secondary | ICD-10-CM | POA: Diagnosis not present

## 2017-08-17 DIAGNOSIS — E039 Hypothyroidism, unspecified: Secondary | ICD-10-CM | POA: Diagnosis not present

## 2017-08-17 DIAGNOSIS — Z6832 Body mass index (BMI) 32.0-32.9, adult: Secondary | ICD-10-CM | POA: Diagnosis not present

## 2017-08-17 DIAGNOSIS — L659 Nonscarring hair loss, unspecified: Secondary | ICD-10-CM | POA: Diagnosis not present

## 2017-08-17 DIAGNOSIS — M25562 Pain in left knee: Secondary | ICD-10-CM | POA: Diagnosis not present

## 2017-08-17 DIAGNOSIS — D6949 Other primary thrombocytopenia: Secondary | ICD-10-CM | POA: Diagnosis not present

## 2017-08-17 DIAGNOSIS — R270 Ataxia, unspecified: Secondary | ICD-10-CM | POA: Diagnosis not present

## 2017-08-17 DIAGNOSIS — M1990 Unspecified osteoarthritis, unspecified site: Secondary | ICD-10-CM | POA: Diagnosis not present

## 2017-08-17 DIAGNOSIS — E0590 Thyrotoxicosis, unspecified without thyrotoxic crisis or storm: Secondary | ICD-10-CM | POA: Diagnosis not present

## 2017-08-17 DIAGNOSIS — D509 Iron deficiency anemia, unspecified: Secondary | ICD-10-CM | POA: Diagnosis not present

## 2017-08-17 DIAGNOSIS — F339 Major depressive disorder, recurrent, unspecified: Secondary | ICD-10-CM | POA: Diagnosis not present

## 2017-09-01 DIAGNOSIS — M25562 Pain in left knee: Secondary | ICD-10-CM | POA: Diagnosis not present

## 2017-09-01 DIAGNOSIS — M25561 Pain in right knee: Secondary | ICD-10-CM | POA: Diagnosis not present

## 2017-09-12 DIAGNOSIS — M1710 Unilateral primary osteoarthritis, unspecified knee: Secondary | ICD-10-CM | POA: Insufficient documentation

## 2017-09-12 DIAGNOSIS — M179 Osteoarthritis of knee, unspecified: Secondary | ICD-10-CM | POA: Insufficient documentation

## 2017-10-12 DIAGNOSIS — D519 Vitamin B12 deficiency anemia, unspecified: Secondary | ICD-10-CM | POA: Diagnosis not present

## 2017-10-12 DIAGNOSIS — E782 Mixed hyperlipidemia: Secondary | ICD-10-CM | POA: Diagnosis not present

## 2017-10-12 DIAGNOSIS — D6949 Other primary thrombocytopenia: Secondary | ICD-10-CM | POA: Diagnosis not present

## 2017-10-12 DIAGNOSIS — D509 Iron deficiency anemia, unspecified: Secondary | ICD-10-CM | POA: Diagnosis not present

## 2017-10-12 DIAGNOSIS — F411 Generalized anxiety disorder: Secondary | ICD-10-CM | POA: Diagnosis not present

## 2017-10-12 DIAGNOSIS — L659 Nonscarring hair loss, unspecified: Secondary | ICD-10-CM | POA: Diagnosis not present

## 2017-10-12 DIAGNOSIS — I10 Essential (primary) hypertension: Secondary | ICD-10-CM | POA: Diagnosis not present

## 2017-10-12 DIAGNOSIS — E871 Hypo-osmolality and hyponatremia: Secondary | ICD-10-CM | POA: Diagnosis not present

## 2017-10-12 DIAGNOSIS — E039 Hypothyroidism, unspecified: Secondary | ICD-10-CM | POA: Diagnosis not present

## 2017-10-12 DIAGNOSIS — R270 Ataxia, unspecified: Secondary | ICD-10-CM | POA: Diagnosis not present

## 2017-10-12 DIAGNOSIS — F339 Major depressive disorder, recurrent, unspecified: Secondary | ICD-10-CM | POA: Diagnosis not present

## 2017-10-12 DIAGNOSIS — K589 Irritable bowel syndrome without diarrhea: Secondary | ICD-10-CM | POA: Diagnosis not present

## 2017-10-21 DIAGNOSIS — Z0000 Encounter for general adult medical examination without abnormal findings: Secondary | ICD-10-CM | POA: Diagnosis not present

## 2017-10-21 DIAGNOSIS — B373 Candidiasis of vulva and vagina: Secondary | ICD-10-CM | POA: Diagnosis not present

## 2017-10-21 DIAGNOSIS — Z6832 Body mass index (BMI) 32.0-32.9, adult: Secondary | ICD-10-CM | POA: Diagnosis not present

## 2017-11-03 DIAGNOSIS — N760 Acute vaginitis: Secondary | ICD-10-CM | POA: Diagnosis not present

## 2017-11-03 DIAGNOSIS — M8580 Other specified disorders of bone density and structure, unspecified site: Secondary | ICD-10-CM | POA: Diagnosis not present

## 2017-11-03 DIAGNOSIS — Z1231 Encounter for screening mammogram for malignant neoplasm of breast: Secondary | ICD-10-CM | POA: Diagnosis not present

## 2017-11-03 DIAGNOSIS — Z23 Encounter for immunization: Secondary | ICD-10-CM | POA: Diagnosis not present

## 2017-11-03 DIAGNOSIS — Z01419 Encounter for gynecological examination (general) (routine) without abnormal findings: Secondary | ICD-10-CM | POA: Diagnosis not present

## 2017-11-28 DIAGNOSIS — K589 Irritable bowel syndrome without diarrhea: Secondary | ICD-10-CM | POA: Diagnosis not present

## 2017-11-28 DIAGNOSIS — R270 Ataxia, unspecified: Secondary | ICD-10-CM | POA: Diagnosis not present

## 2017-11-28 DIAGNOSIS — F339 Major depressive disorder, recurrent, unspecified: Secondary | ICD-10-CM | POA: Diagnosis not present

## 2017-11-28 DIAGNOSIS — D519 Vitamin B12 deficiency anemia, unspecified: Secondary | ICD-10-CM | POA: Diagnosis not present

## 2017-11-28 DIAGNOSIS — L659 Nonscarring hair loss, unspecified: Secondary | ICD-10-CM | POA: Diagnosis not present

## 2017-11-28 DIAGNOSIS — F411 Generalized anxiety disorder: Secondary | ICD-10-CM | POA: Diagnosis not present

## 2017-11-28 DIAGNOSIS — E039 Hypothyroidism, unspecified: Secondary | ICD-10-CM | POA: Diagnosis not present

## 2017-11-28 DIAGNOSIS — E871 Hypo-osmolality and hyponatremia: Secondary | ICD-10-CM | POA: Diagnosis not present

## 2017-11-28 DIAGNOSIS — E782 Mixed hyperlipidemia: Secondary | ICD-10-CM | POA: Diagnosis not present

## 2017-11-28 DIAGNOSIS — I10 Essential (primary) hypertension: Secondary | ICD-10-CM | POA: Diagnosis not present

## 2017-11-28 DIAGNOSIS — Z0000 Encounter for general adult medical examination without abnormal findings: Secondary | ICD-10-CM | POA: Diagnosis not present

## 2017-11-28 DIAGNOSIS — B373 Candidiasis of vulva and vagina: Secondary | ICD-10-CM | POA: Diagnosis not present

## 2017-12-07 ENCOUNTER — Other Ambulatory Visit: Payer: Self-pay

## 2017-12-07 DIAGNOSIS — L905 Scar conditions and fibrosis of skin: Secondary | ICD-10-CM | POA: Diagnosis not present

## 2017-12-09 DIAGNOSIS — D485 Neoplasm of uncertain behavior of skin: Secondary | ICD-10-CM | POA: Diagnosis not present

## 2017-12-09 DIAGNOSIS — D229 Melanocytic nevi, unspecified: Secondary | ICD-10-CM | POA: Diagnosis not present

## 2017-12-09 DIAGNOSIS — L668 Other cicatricial alopecia: Secondary | ICD-10-CM | POA: Diagnosis not present

## 2017-12-13 ENCOUNTER — Ambulatory Visit: Payer: PPO | Admitting: Cardiology

## 2018-01-11 ENCOUNTER — Other Ambulatory Visit: Payer: Self-pay | Admitting: Cardiology

## 2018-02-09 ENCOUNTER — Other Ambulatory Visit: Payer: Self-pay | Admitting: Cardiology

## 2018-02-09 NOTE — Telephone Encounter (Signed)
Pt needs appointment for future refills 

## 2018-02-27 ENCOUNTER — Telehealth: Payer: Self-pay | Admitting: Cardiology

## 2018-02-27 ENCOUNTER — Other Ambulatory Visit: Payer: Self-pay | Admitting: Cardiology

## 2018-02-27 MED ORDER — DILTIAZEM HCL ER COATED BEADS 180 MG PO CP24: ORAL_CAPSULE | ORAL | 0 refills | Status: DC

## 2018-02-27 NOTE — Telephone Encounter (Signed)
Sent!

## 2018-02-27 NOTE — Telephone Encounter (Signed)
Needing refill for diltiazem (CARDIZEM CD) 180 MG 24 hr capsule [[id number]]  Sent to [facility], pt's scheduled for end of Feb to see Dr. Domenic Polite

## 2018-03-09 ENCOUNTER — Other Ambulatory Visit: Payer: Self-pay

## 2018-03-09 DIAGNOSIS — D485 Neoplasm of uncertain behavior of skin: Secondary | ICD-10-CM | POA: Diagnosis not present

## 2018-03-09 DIAGNOSIS — D2262 Melanocytic nevi of left upper limb, including shoulder: Secondary | ICD-10-CM | POA: Diagnosis not present

## 2018-03-15 DIAGNOSIS — F3181 Bipolar II disorder: Secondary | ICD-10-CM | POA: Diagnosis not present

## 2018-03-17 DIAGNOSIS — L089 Local infection of the skin and subcutaneous tissue, unspecified: Secondary | ICD-10-CM | POA: Diagnosis not present

## 2018-03-21 NOTE — Progress Notes (Signed)
Cardiology Office Note  Date: 03/22/2018   ID: [name], [name] [birth year], MRN [id number]  PCP: Carrie Squibb, MD  Primary Cardiologist: Carrie Lesches, MD   Chief Complaint  Patient presents with  . PSVT    History of Present Illness: [name] is a 72 y.o. adult last seen in August 2018.  She is here for a routine visit.  She describes only brief palpitations occasionally, no increasing frequency or duration.  She has had no dizziness or syncope.  She has been primary caregiver for her husband who has had recent health changes and underwent surgery.  I reviewed her medications.  She continues on stable dose of Cardizem CD 880 mg daily.  I personally reviewed her ECG today which shows normal sinus rhythm with right bundle branch block.  Past Medical History:  Diagnosis Date  . Allergic rhinitis   . B12 deficiency   . Bipolar disorder ([place])   . Essential hypertension, benign   . GERD (gastroesophageal reflux disease)   . Hashimoto's thyroiditis   . History of transient ischemic attack (TIA)    Or possibly migraine as well as right amaurosis fugax and ataxia - Dr. Erling [name]  . Hyperlipidemia   . PSVT (paroxysmal supraventricular tachycardia) ([place])     Past Surgical History:  Procedure Laterality Date  . LAPAROSCOPIC CHOLECYSTECTOMY  2010  . THYROIDECTOMY  2009    Current Outpatient Medications  Medication Sig Dispense Refill  . aspirin EC 81 MG tablet Take 81 mg by mouth daily.    . Biotin 5000 MCG TABS Take 5,000 mcg by mouth daily.     Marland Kitchen buPROPion (WELLBUTRIN XL) 300 MG 24 hr tablet Take 300 mg by mouth daily.     . calcium carbonate (TUMS - DOSED IN MG ELEMENTAL CALCIUM) 500 MG chewable tablet Chew 4 tablets by mouth daily.    . cephALEXin (KEFLEX) 500 MG capsule     . Cholecalciferol (VITAMIN D3) 1000 UNITS CAPS Take 2 capsules by mouth daily.    . clonazePAM (KLONOPIN) 1 MG tablet Take 1 mg by mouth at bedtime. May take up to 3 times daily as needed for  anxiety    . cyanocobalamin (,VITAMIN B-12,) 1000 MCG/ML injection Inject 1,000 mcg into the muscle every 30 (thirty) days.     Marland Kitchen dexlansoprazole (DEXILANT) 60 MG capsule Take 60 mg by mouth at bedtime.     Marland Kitchen diltiazem (CARDIZEM CD) 180 MG 24 hr capsule TAKE (1) CAPSULE BY MOUTH ONCE DAILY. 90 capsule 2  . DULoxetine (CYMBALTA) 60 MG capsule Take 60 mg by mouth 2 (two) times daily.     Marland Kitchen losartan (COZAAR) 50 MG tablet Take 50 mg by mouth daily.    . Probiotic Product (PROBIOTIC DAILY) CAPS Take 1 capsule by mouth daily.    Marland Kitchen SYNTHROID 150 MCG tablet      No current facility-administered medications for this visit.    Allergies:  Penicillins; Amoxicillin-pot clavulanate; Atenolol; and Sulfonamide derivatives   Social History: The patient  reports that he quit smoking about 35 years ago. His smoking use included cigarettes. He has a 5.00 pack-year smoking history. He has never used smokeless tobacco. He reports that he does not drink alcohol or use drugs.   ROS:  Please see the history of present illness. Otherwise, complete review of systems is positive for arthritic knee pain.  All other systems are reviewed and negative.   Physical Exam: VS:  BP 115/65   Pulse  74   Ht 5\' 2"  (1.575 m)   Wt 177 lb (80.3 kg)   SpO2 98%   BMI 32.37 kg/m , BMI Body mass index is 32.37 kg/m.  Wt Readings from Last 3 Encounters:  03/22/18 177 lb (80.3 kg)  09/15/16 172 lb (78 kg)  09/09/15 163 lb (73.9 kg)    General: Patient appears comfortable at rest. HEENT: Conjunctiva and lids normal, oropharynx clear. Neck: Supple, no elevated JVP or carotid bruits, no thyromegaly. Lungs: Clear to auscultation, nonlabored breathing at rest. Cardiac: Regular rate and rhythm, no S3, soft systolic murmur. Abdomen: Soft, nontender, bowel sounds present. Extremities: No pitting edema, distal pulses 2+.  ECG: I personally reviewed the tracing from 09/15/2016 which showed sinus rhythm with right bundle branch  block.  Other Studies Reviewed Today:  Echocardiogram 02/11/2011: Study Conclusions  - Left ventricle: The cavity size was normal. Wall thickness was at the upper limits of normal. Systolic function was vigorous. The estimated ejection fraction was in the range of 65% to 70%. Wall motion was normal; there were no regional wall motion abnormalities. The study is not technically sufficient to allow evaluation of LV diastolic function. - Mitral valve: Trivial regurgitation. - Atrial septum: No clear defect or patent foramen ovale was identified. - Tricuspid valve: Mild regurgitation. - Pulmonary arteries: PA peak pressure: 41mm Hg (S). - Pericardium, extracardiac: There was no pericardial effusion.  Assessment and Plan:  1.  PSVT, no progressive palpitations on Cardizem CD 180 mg daily.  ECG reviewed and stable.  Continue with observation.  2.  Hypothyroidism on Synthroid.  She continues to follow with Dr. Nevada [name].  Current medicines were reviewed with the patient today.   Orders Placed This Encounter  Procedures  . EKG 12-Lead    Disposition: Follow-up in 1 year.  Signed, Carrie Sark, MD, [facility] 03/22/2018 1:47 PM    [place] at [facility] [address] [place], [place] [address] Phone: [phone]; Fax: [phone]

## 2018-03-22 ENCOUNTER — Encounter: Payer: Self-pay | Admitting: Cardiology

## 2018-03-22 ENCOUNTER — Ambulatory Visit: Payer: PPO | Admitting: Cardiology

## 2018-03-22 VITALS — BP 115/65 | HR 74 | Ht 62.0 in | Wt 177.0 lb

## 2018-03-22 DIAGNOSIS — E039 Hypothyroidism, unspecified: Secondary | ICD-10-CM | POA: Diagnosis not present

## 2018-03-22 DIAGNOSIS — I471 Supraventricular tachycardia: Secondary | ICD-10-CM | POA: Diagnosis not present

## 2018-03-22 NOTE — Patient Instructions (Signed)

## 2018-03-27 ENCOUNTER — Other Ambulatory Visit: Payer: Self-pay | Admitting: Cardiology

## 2018-04-27 DIAGNOSIS — I10 Essential (primary) hypertension: Secondary | ICD-10-CM | POA: Diagnosis not present

## 2018-04-27 DIAGNOSIS — E039 Hypothyroidism, unspecified: Secondary | ICD-10-CM | POA: Diagnosis not present

## 2018-04-27 DIAGNOSIS — E782 Mixed hyperlipidemia: Secondary | ICD-10-CM | POA: Diagnosis not present

## 2018-04-27 DIAGNOSIS — F411 Generalized anxiety disorder: Secondary | ICD-10-CM | POA: Diagnosis not present

## 2018-04-27 DIAGNOSIS — I479 Paroxysmal tachycardia, unspecified: Secondary | ICD-10-CM | POA: Diagnosis not present

## 2018-04-27 DIAGNOSIS — D519 Vitamin B12 deficiency anemia, unspecified: Secondary | ICD-10-CM | POA: Diagnosis not present

## 2018-04-27 DIAGNOSIS — D509 Iron deficiency anemia, unspecified: Secondary | ICD-10-CM | POA: Diagnosis not present

## 2018-04-27 DIAGNOSIS — F339 Major depressive disorder, recurrent, unspecified: Secondary | ICD-10-CM | POA: Diagnosis not present

## 2018-04-27 DIAGNOSIS — K219 Gastro-esophageal reflux disease without esophagitis: Secondary | ICD-10-CM | POA: Diagnosis not present

## 2018-06-01 DIAGNOSIS — K219 Gastro-esophageal reflux disease without esophagitis: Secondary | ICD-10-CM | POA: Diagnosis not present

## 2018-06-01 DIAGNOSIS — F419 Anxiety disorder, unspecified: Secondary | ICD-10-CM | POA: Diagnosis not present

## 2018-06-01 DIAGNOSIS — F39 Unspecified mood [affective] disorder: Secondary | ICD-10-CM | POA: Diagnosis not present

## 2018-06-01 DIAGNOSIS — D649 Anemia, unspecified: Secondary | ICD-10-CM | POA: Diagnosis not present

## 2018-06-01 DIAGNOSIS — E039 Hypothyroidism, unspecified: Secondary | ICD-10-CM | POA: Diagnosis not present

## 2018-06-01 DIAGNOSIS — E782 Mixed hyperlipidemia: Secondary | ICD-10-CM | POA: Diagnosis not present

## 2018-06-01 DIAGNOSIS — I10 Essential (primary) hypertension: Secondary | ICD-10-CM | POA: Diagnosis not present

## 2018-06-12 DIAGNOSIS — Z0000 Encounter for general adult medical examination without abnormal findings: Secondary | ICD-10-CM | POA: Diagnosis not present

## 2018-06-22 DIAGNOSIS — R270 Ataxia, unspecified: Secondary | ICD-10-CM | POA: Diagnosis not present

## 2018-06-22 DIAGNOSIS — D519 Vitamin B12 deficiency anemia, unspecified: Secondary | ICD-10-CM | POA: Diagnosis not present

## 2018-06-22 DIAGNOSIS — L659 Nonscarring hair loss, unspecified: Secondary | ICD-10-CM | POA: Diagnosis not present

## 2018-06-22 DIAGNOSIS — E871 Hypo-osmolality and hyponatremia: Secondary | ICD-10-CM | POA: Diagnosis not present

## 2018-06-22 DIAGNOSIS — I10 Essential (primary) hypertension: Secondary | ICD-10-CM | POA: Diagnosis not present

## 2018-06-22 DIAGNOSIS — E782 Mixed hyperlipidemia: Secondary | ICD-10-CM | POA: Diagnosis not present

## 2018-06-22 DIAGNOSIS — K589 Irritable bowel syndrome without diarrhea: Secondary | ICD-10-CM | POA: Diagnosis not present

## 2018-06-22 DIAGNOSIS — F339 Major depressive disorder, recurrent, unspecified: Secondary | ICD-10-CM | POA: Diagnosis not present

## 2018-06-22 DIAGNOSIS — F411 Generalized anxiety disorder: Secondary | ICD-10-CM | POA: Diagnosis not present

## 2018-06-22 DIAGNOSIS — E039 Hypothyroidism, unspecified: Secondary | ICD-10-CM | POA: Diagnosis not present

## 2018-06-22 DIAGNOSIS — D649 Anemia, unspecified: Secondary | ICD-10-CM | POA: Diagnosis not present

## 2018-06-22 DIAGNOSIS — R350 Frequency of micturition: Secondary | ICD-10-CM | POA: Diagnosis not present

## 2018-06-22 DIAGNOSIS — F419 Anxiety disorder, unspecified: Secondary | ICD-10-CM | POA: Diagnosis not present

## 2018-06-22 DIAGNOSIS — B373 Candidiasis of vulva and vagina: Secondary | ICD-10-CM | POA: Diagnosis not present

## 2018-06-22 DIAGNOSIS — F39 Unspecified mood [affective] disorder: Secondary | ICD-10-CM | POA: Diagnosis not present

## 2018-06-22 DIAGNOSIS — Z0000 Encounter for general adult medical examination without abnormal findings: Secondary | ICD-10-CM | POA: Diagnosis not present

## 2018-06-26 DIAGNOSIS — F339 Major depressive disorder, recurrent, unspecified: Secondary | ICD-10-CM | POA: Diagnosis not present

## 2018-06-26 DIAGNOSIS — I10 Essential (primary) hypertension: Secondary | ICD-10-CM | POA: Diagnosis not present

## 2018-06-26 DIAGNOSIS — F411 Generalized anxiety disorder: Secondary | ICD-10-CM | POA: Diagnosis not present

## 2018-06-26 DIAGNOSIS — D519 Vitamin B12 deficiency anemia, unspecified: Secondary | ICD-10-CM | POA: Diagnosis not present

## 2018-06-26 DIAGNOSIS — E782 Mixed hyperlipidemia: Secondary | ICD-10-CM | POA: Diagnosis not present

## 2018-06-26 DIAGNOSIS — E039 Hypothyroidism, unspecified: Secondary | ICD-10-CM | POA: Diagnosis not present

## 2018-06-26 DIAGNOSIS — K219 Gastro-esophageal reflux disease without esophagitis: Secondary | ICD-10-CM | POA: Diagnosis not present

## 2018-06-26 DIAGNOSIS — I479 Paroxysmal tachycardia, unspecified: Secondary | ICD-10-CM | POA: Diagnosis not present

## 2018-06-27 ENCOUNTER — Other Ambulatory Visit: Payer: Self-pay | Admitting: Internal Medicine

## 2018-06-27 DIAGNOSIS — Z780 Asymptomatic menopausal state: Secondary | ICD-10-CM

## 2018-08-24 ENCOUNTER — Other Ambulatory Visit: Payer: Self-pay

## 2018-09-13 DIAGNOSIS — F332 Major depressive disorder, recurrent severe without psychotic features: Secondary | ICD-10-CM | POA: Diagnosis not present

## 2018-09-29 ENCOUNTER — Other Ambulatory Visit: Payer: Self-pay | Admitting: Cardiology

## 2018-10-09 DIAGNOSIS — F411 Generalized anxiety disorder: Secondary | ICD-10-CM | POA: Diagnosis not present

## 2018-10-09 DIAGNOSIS — I10 Essential (primary) hypertension: Secondary | ICD-10-CM | POA: Diagnosis not present

## 2018-10-09 DIAGNOSIS — K219 Gastro-esophageal reflux disease without esophagitis: Secondary | ICD-10-CM | POA: Diagnosis not present

## 2018-10-09 DIAGNOSIS — D519 Vitamin B12 deficiency anemia, unspecified: Secondary | ICD-10-CM | POA: Diagnosis not present

## 2018-10-09 DIAGNOSIS — Z23 Encounter for immunization: Secondary | ICD-10-CM | POA: Diagnosis not present

## 2018-10-23 DIAGNOSIS — I10 Essential (primary) hypertension: Secondary | ICD-10-CM | POA: Diagnosis not present

## 2018-10-23 DIAGNOSIS — F411 Generalized anxiety disorder: Secondary | ICD-10-CM | POA: Diagnosis not present

## 2018-10-23 DIAGNOSIS — D519 Vitamin B12 deficiency anemia, unspecified: Secondary | ICD-10-CM | POA: Diagnosis not present

## 2018-10-23 DIAGNOSIS — K219 Gastro-esophageal reflux disease without esophagitis: Secondary | ICD-10-CM | POA: Diagnosis not present

## 2018-12-05 DIAGNOSIS — K219 Gastro-esophageal reflux disease without esophagitis: Secondary | ICD-10-CM | POA: Diagnosis not present

## 2018-12-05 DIAGNOSIS — Z1211 Encounter for screening for malignant neoplasm of colon: Secondary | ICD-10-CM | POA: Diagnosis not present

## 2018-12-05 DIAGNOSIS — R140 Abdominal distension (gaseous): Secondary | ICD-10-CM | POA: Diagnosis not present

## 2018-12-08 DIAGNOSIS — E039 Hypothyroidism, unspecified: Secondary | ICD-10-CM | POA: Diagnosis not present

## 2018-12-08 DIAGNOSIS — E559 Vitamin D deficiency, unspecified: Secondary | ICD-10-CM | POA: Diagnosis not present

## 2018-12-08 DIAGNOSIS — I10 Essential (primary) hypertension: Secondary | ICD-10-CM | POA: Diagnosis not present

## 2018-12-08 DIAGNOSIS — E782 Mixed hyperlipidemia: Secondary | ICD-10-CM | POA: Diagnosis not present

## 2018-12-08 DIAGNOSIS — D509 Iron deficiency anemia, unspecified: Secondary | ICD-10-CM | POA: Diagnosis not present

## 2018-12-08 DIAGNOSIS — E0590 Thyrotoxicosis, unspecified without thyrotoxic crisis or storm: Secondary | ICD-10-CM | POA: Diagnosis not present

## 2018-12-08 DIAGNOSIS — D649 Anemia, unspecified: Secondary | ICD-10-CM | POA: Diagnosis not present

## 2018-12-08 DIAGNOSIS — E871 Hypo-osmolality and hyponatremia: Secondary | ICD-10-CM | POA: Diagnosis not present

## 2018-12-08 DIAGNOSIS — D519 Vitamin B12 deficiency anemia, unspecified: Secondary | ICD-10-CM | POA: Diagnosis not present

## 2018-12-12 DIAGNOSIS — I10 Essential (primary) hypertension: Secondary | ICD-10-CM | POA: Diagnosis not present

## 2018-12-12 DIAGNOSIS — K219 Gastro-esophageal reflux disease without esophagitis: Secondary | ICD-10-CM | POA: Diagnosis not present

## 2018-12-12 DIAGNOSIS — E039 Hypothyroidism, unspecified: Secondary | ICD-10-CM | POA: Diagnosis not present

## 2018-12-12 DIAGNOSIS — F411 Generalized anxiety disorder: Secondary | ICD-10-CM | POA: Diagnosis not present

## 2018-12-12 DIAGNOSIS — I479 Paroxysmal tachycardia, unspecified: Secondary | ICD-10-CM | POA: Diagnosis not present

## 2018-12-12 DIAGNOSIS — D519 Vitamin B12 deficiency anemia, unspecified: Secondary | ICD-10-CM | POA: Diagnosis not present

## 2018-12-12 DIAGNOSIS — F339 Major depressive disorder, recurrent, unspecified: Secondary | ICD-10-CM | POA: Diagnosis not present

## 2018-12-12 DIAGNOSIS — E782 Mixed hyperlipidemia: Secondary | ICD-10-CM | POA: Diagnosis not present

## 2019-01-03 DIAGNOSIS — K219 Gastro-esophageal reflux disease without esophagitis: Secondary | ICD-10-CM | POA: Diagnosis not present

## 2019-01-03 DIAGNOSIS — F339 Major depressive disorder, recurrent, unspecified: Secondary | ICD-10-CM | POA: Diagnosis not present

## 2019-01-03 DIAGNOSIS — E7849 Other hyperlipidemia: Secondary | ICD-10-CM | POA: Diagnosis not present

## 2019-01-03 DIAGNOSIS — I10 Essential (primary) hypertension: Secondary | ICD-10-CM | POA: Diagnosis not present

## 2019-01-03 DIAGNOSIS — F411 Generalized anxiety disorder: Secondary | ICD-10-CM | POA: Diagnosis not present

## 2019-01-03 DIAGNOSIS — D519 Vitamin B12 deficiency anemia, unspecified: Secondary | ICD-10-CM | POA: Diagnosis not present

## 2019-01-10 DIAGNOSIS — F3181 Bipolar II disorder: Secondary | ICD-10-CM | POA: Diagnosis not present

## 2019-01-15 DIAGNOSIS — K589 Irritable bowel syndrome without diarrhea: Secondary | ICD-10-CM | POA: Diagnosis not present

## 2019-01-15 DIAGNOSIS — Z0000 Encounter for general adult medical examination without abnormal findings: Secondary | ICD-10-CM | POA: Diagnosis not present

## 2019-01-15 DIAGNOSIS — L659 Nonscarring hair loss, unspecified: Secondary | ICD-10-CM | POA: Diagnosis not present

## 2019-01-15 DIAGNOSIS — D649 Anemia, unspecified: Secondary | ICD-10-CM | POA: Diagnosis not present

## 2019-01-15 DIAGNOSIS — F339 Major depressive disorder, recurrent, unspecified: Secondary | ICD-10-CM | POA: Diagnosis not present

## 2019-01-15 DIAGNOSIS — E782 Mixed hyperlipidemia: Secondary | ICD-10-CM | POA: Diagnosis not present

## 2019-01-15 DIAGNOSIS — I10 Essential (primary) hypertension: Secondary | ICD-10-CM | POA: Diagnosis not present

## 2019-01-15 DIAGNOSIS — D519 Vitamin B12 deficiency anemia, unspecified: Secondary | ICD-10-CM | POA: Diagnosis not present

## 2019-01-15 DIAGNOSIS — B373 Candidiasis of vulva and vagina: Secondary | ICD-10-CM | POA: Diagnosis not present

## 2019-01-15 DIAGNOSIS — F419 Anxiety disorder, unspecified: Secondary | ICD-10-CM | POA: Diagnosis not present

## 2019-01-15 DIAGNOSIS — R350 Frequency of micturition: Secondary | ICD-10-CM | POA: Diagnosis not present

## 2019-01-15 DIAGNOSIS — F39 Unspecified mood [affective] disorder: Secondary | ICD-10-CM | POA: Diagnosis not present

## 2019-02-27 DIAGNOSIS — D519 Vitamin B12 deficiency anemia, unspecified: Secondary | ICD-10-CM | POA: Diagnosis not present

## 2019-02-27 DIAGNOSIS — I10 Essential (primary) hypertension: Secondary | ICD-10-CM | POA: Diagnosis not present

## 2019-02-27 DIAGNOSIS — E039 Hypothyroidism, unspecified: Secondary | ICD-10-CM | POA: Diagnosis not present

## 2019-02-27 DIAGNOSIS — I479 Paroxysmal tachycardia, unspecified: Secondary | ICD-10-CM | POA: Diagnosis not present

## 2019-02-27 DIAGNOSIS — D509 Iron deficiency anemia, unspecified: Secondary | ICD-10-CM | POA: Diagnosis not present

## 2019-02-27 DIAGNOSIS — F339 Major depressive disorder, recurrent, unspecified: Secondary | ICD-10-CM | POA: Diagnosis not present

## 2019-02-27 DIAGNOSIS — F411 Generalized anxiety disorder: Secondary | ICD-10-CM | POA: Diagnosis not present

## 2019-02-27 DIAGNOSIS — K219 Gastro-esophageal reflux disease without esophagitis: Secondary | ICD-10-CM | POA: Diagnosis not present

## 2019-02-27 DIAGNOSIS — E782 Mixed hyperlipidemia: Secondary | ICD-10-CM | POA: Diagnosis not present

## 2019-03-19 DIAGNOSIS — R140 Abdominal distension (gaseous): Secondary | ICD-10-CM | POA: Diagnosis not present

## 2019-03-30 DIAGNOSIS — Z01419 Encounter for gynecological examination (general) (routine) without abnormal findings: Secondary | ICD-10-CM | POA: Diagnosis not present

## 2019-03-30 DIAGNOSIS — Z1231 Encounter for screening mammogram for malignant neoplasm of breast: Secondary | ICD-10-CM | POA: Diagnosis not present

## 2019-04-03 DIAGNOSIS — F332 Major depressive disorder, recurrent severe without psychotic features: Secondary | ICD-10-CM | POA: Diagnosis not present

## 2019-04-13 DIAGNOSIS — F332 Major depressive disorder, recurrent severe without psychotic features: Secondary | ICD-10-CM | POA: Diagnosis not present

## 2019-04-20 DIAGNOSIS — J069 Acute upper respiratory infection, unspecified: Secondary | ICD-10-CM | POA: Diagnosis not present

## 2019-05-01 DIAGNOSIS — F3181 Bipolar II disorder: Secondary | ICD-10-CM | POA: Diagnosis not present

## 2019-05-11 DIAGNOSIS — R0602 Shortness of breath: Secondary | ICD-10-CM | POA: Diagnosis not present

## 2019-05-11 DIAGNOSIS — D649 Anemia, unspecified: Secondary | ICD-10-CM | POA: Diagnosis not present

## 2019-05-11 DIAGNOSIS — E7849 Other hyperlipidemia: Secondary | ICD-10-CM | POA: Diagnosis not present

## 2019-05-11 DIAGNOSIS — M25562 Pain in left knee: Secondary | ICD-10-CM | POA: Diagnosis not present

## 2019-05-11 DIAGNOSIS — D6949 Other primary thrombocytopenia: Secondary | ICD-10-CM | POA: Diagnosis not present

## 2019-05-11 DIAGNOSIS — E559 Vitamin D deficiency, unspecified: Secondary | ICD-10-CM | POA: Diagnosis not present

## 2019-05-11 DIAGNOSIS — E0590 Thyrotoxicosis, unspecified without thyrotoxic crisis or storm: Secondary | ICD-10-CM | POA: Diagnosis not present

## 2019-05-11 DIAGNOSIS — H6121 Impacted cerumen, right ear: Secondary | ICD-10-CM | POA: Diagnosis not present

## 2019-05-11 DIAGNOSIS — F411 Generalized anxiety disorder: Secondary | ICD-10-CM | POA: Diagnosis not present

## 2019-05-11 DIAGNOSIS — E039 Hypothyroidism, unspecified: Secondary | ICD-10-CM | POA: Diagnosis not present

## 2019-05-11 DIAGNOSIS — E782 Mixed hyperlipidemia: Secondary | ICD-10-CM | POA: Diagnosis not present

## 2019-05-11 DIAGNOSIS — E871 Hypo-osmolality and hyponatremia: Secondary | ICD-10-CM | POA: Diagnosis not present

## 2019-05-11 DIAGNOSIS — D519 Vitamin B12 deficiency anemia, unspecified: Secondary | ICD-10-CM | POA: Diagnosis not present

## 2019-05-11 DIAGNOSIS — B373 Candidiasis of vulva and vagina: Secondary | ICD-10-CM | POA: Diagnosis not present

## 2019-05-11 DIAGNOSIS — D509 Iron deficiency anemia, unspecified: Secondary | ICD-10-CM | POA: Diagnosis not present

## 2019-05-11 DIAGNOSIS — F339 Major depressive disorder, recurrent, unspecified: Secondary | ICD-10-CM | POA: Diagnosis not present

## 2019-05-11 DIAGNOSIS — F331 Major depressive disorder, recurrent, moderate: Secondary | ICD-10-CM | POA: Diagnosis not present

## 2019-05-11 DIAGNOSIS — N390 Urinary tract infection, site not specified: Secondary | ICD-10-CM | POA: Diagnosis not present

## 2019-05-15 ENCOUNTER — Telehealth: Payer: Self-pay | Admitting: Cardiology

## 2019-05-15 NOTE — Telephone Encounter (Signed)
  Patient Consent for Virtual Visit         [name] has provided verbal consent on 05/15/2019 for a virtual visit (video or telephone).   CONSENT FOR VIRTUAL VISIT FOR:  [name]  By participating in this virtual visit I agree to the following:  I hereby voluntarily request, consent and authorize [facility] and its employed or contracted physicians, physician assistants, nurse practitioners or other licensed health care professionals (the Practitioner), to provide me with telemedicine health care services (the "Services") as deemed necessary by the treating Practitioner. I acknowledge and consent to receive the Services by the Practitioner via telemedicine. I understand that the telemedicine visit will involve communicating with the Practitioner through live audiovisual communication technology and the disclosure of certain medical information by electronic transmission. I acknowledge that I have been given the opportunity to request an in-person assessment or other available alternative prior to the telemedicine visit and am voluntarily participating in the telemedicine visit.  I understand that I have the right to withhold or withdraw my consent to the use of telemedicine in the course of my care at any time, without affecting my right to future care or treatment, and that the Practitioner or I may terminate the telemedicine visit at any time. I understand that I have the right to inspect all information obtained and/or recorded in the course of the telemedicine visit and may receive copies of available information for a reasonable fee.  I understand that some of the potential risks of receiving the Services via telemedicine include:  Marland Kitchen Delay or interruption in medical evaluation due to technological equipment failure or disruption; . Information transmitted may not be sufficient (e.g. poor resolution of images) to allow for appropriate medical decision making by the  Practitioner; and/or  . In rare instances, security protocols could fail, causing a breach of personal health information.  Furthermore, I acknowledge that it is my responsibility to provide information about my medical history, conditions and care that is complete and accurate to the best of my ability. I acknowledge that Practitioner's advice, recommendations, and/or decision may be based on factors not within their control, such as incomplete or inaccurate data provided by me or distortions of diagnostic images or specimens that may result from electronic transmissions. I understand that the practice of medicine is not an exact science and that Practitioner makes no warranties or guarantees regarding treatment outcomes. I acknowledge that a copy of this consent can be made available to me via my patient portal ([facility]), or I can request a printed copy by calling the office of [facility].    I understand that my insurance will be billed for this visit.   I have read or had this consent read to me. . I understand the contents of this consent, which adequately explains the benefits and risks of the Services being provided via telemedicine.  . I have been provided ample opportunity to ask questions regarding this consent and the Services and have had my questions answered to my satisfaction. . I give my informed consent for the services to be provided through the use of telemedicine in my medical care

## 2019-05-16 ENCOUNTER — Encounter: Payer: Self-pay | Admitting: Cardiology

## 2019-05-16 ENCOUNTER — Telehealth (INDEPENDENT_AMBULATORY_CARE_PROVIDER_SITE_OTHER): Payer: PPO | Admitting: Cardiology

## 2019-05-16 VITALS — Ht 62.0 in | Wt 170.0 lb

## 2019-05-16 DIAGNOSIS — E039 Hypothyroidism, unspecified: Secondary | ICD-10-CM | POA: Diagnosis not present

## 2019-05-16 DIAGNOSIS — I471 Supraventricular tachycardia: Secondary | ICD-10-CM | POA: Diagnosis not present

## 2019-05-16 NOTE — Patient Instructions (Signed)

## 2019-05-16 NOTE — Progress Notes (Signed)
Virtual Visit via Telephone Note   This visit type was conducted due to national recommendations for restrictions regarding the COVID-19 Pandemic (e.g. social distancing) in an effort to limit this patient's exposure and mitigate transmission in our community.  Due to his co-morbid illnesses, this patient is at least at moderate risk for complications without adequate follow up.  This format is felt to be most appropriate for this patient at this time.  The patient did not have access to video technology/had technical difficulties with video requiring transitioning to audio format only (telephone).  All issues noted in this document were discussed and addressed.  No physical exam could be performed with this format.  Please refer to the patient's chart for his  consent to telehealth for [facility].   The patient was identified using 2 identifiers.  Date:  05/16/2019   ID:  [name], DOB 31-May-1945, MRN [id number]  Patient Location: Home Provider Location: Office  PCP:  Celene Squibb, MD  Cardiologist:  Rozann Lesches, MD Electrophysiologist:  None   Evaluation Performed:  Follow-Up Visit  Chief Complaint:  Cardiac follow-up  History of Present Illness:    [name] is a 74 y.o. adult last seen in February 2020.  We spoke by phone today.  She does not report any progressive palpitations, no dizziness or syncope.  She tells me that her husband passed away in 03-04-2022 of this year, he seems to have good support from family members, also a [employer].  I reviewed her medications.  She continues on Cardizem CD 180 mg daily.  The patient does not have symptoms concerning for COVID-19 infection (fever, chills, cough, or new shortness of breath).    Past Medical History:  Diagnosis Date  . Allergic rhinitis   . B12 deficiency   . Bipolar disorder ([place])   . Essential hypertension   . GERD (gastroesophageal reflux disease)   . Hashimoto's thyroiditis   . History  of transient ischemic attack (TIA)    Or possibly migraine as well as right amaurosis fugax and ataxia - Dr. Erling Cruz  . Hyperlipidemia   . PSVT (paroxysmal supraventricular tachycardia) ([place])    Past Surgical History:  Procedure Laterality Date  . LAPAROSCOPIC CHOLECYSTECTOMY  2010  . THYROIDECTOMY  2009     Current Meds  Medication Sig  . aspirin EC 81 MG tablet Take 81 mg by mouth daily.  Marland Kitchen buPROPion (WELLBUTRIN XL) 300 MG 24 hr tablet Take 300 mg by mouth daily.   . calcium carbonate (TUMS - DOSED IN MG ELEMENTAL CALCIUM) 500 MG chewable tablet Chew 4 tablets by mouth daily.  . clonazePAM (KLONOPIN) 1 MG tablet Take 1 mg by mouth at bedtime. May take up to 3 times daily as needed for anxiety  . dexlansoprazole (DEXILANT) 60 MG capsule Take 60 mg by mouth at bedtime.   Marland Kitchen diltiazem (CARDIZEM CD) 180 MG 24 hr capsule TAKE (1) CAPSULE BY MOUTH ONCE DAILY.  . DULoxetine (CYMBALTA) 60 MG capsule Take 60 mg by mouth daily.   . famotidine (PEPCID) 10 MG tablet Take 10 mg by mouth 2 (two) times daily.  Marland Kitchen losartan (COZAAR) 50 MG tablet Take 50 mg by mouth daily.  . meloxicam (MOBIC) 15 MG tablet Take 1 tablet by mouth daily.  . Multiple Vitamin (MULTIVITAMIN) tablet Take 1 tablet by mouth daily.  . nitrofurantoin, macrocrystal-monohydrate, (MACROBID) 100 MG capsule Take 100 mg by mouth 2 (two) times daily.  . Saccharomyces boulardii (FLORASTOR PO)  Take 1 tablet by mouth in the morning and at bedtime.  Marland Kitchen SYNTHROID 125 MCG tablet Take 125 mcg by mouth every morning.  . vitamin B-12 (CYANOCOBALAMIN) 500 MCG tablet Take 500 mcg by mouth daily.     Allergies:   Penicillins, Amoxicillin-pot clavulanate, Atenolol, and Sulfonamide derivatives   ROS:   No syncope.  Prior CV studies:   The following studies were reviewed today:  Echocardiogram 02/11/2011: Study Conclusions  - Left ventricle: The cavity size was normal. Wall thickness was at the upper limits of normal. Systolic function  was vigorous. The estimated ejection fraction was in the range of 65% to 70%. Wall motion was normal; there were no regional wall motion abnormalities. The study is not technically sufficient to allow evaluation of LV diastolic function. - Mitral valve: Trivial regurgitation. - Atrial septum: No clear defect or patent foramen ovale was identified. - Tricuspid valve: Mild regurgitation. - Pulmonary arteries: PA peak pressure: 87mm Hg (S). - Pericardium, extracardiac: There was no pericardial effusion.  Labs/Other Tests and Data Reviewed:    EKG:  An ECG dated 03/22/2018 was personally reviewed today and demonstrated:  Normal sinus rhythm with right bundle branch block.  Recent Labs:  No interval lab work for review today.  Wt Readings from Last 3 Encounters:  05/16/19 170 lb (77.1 kg)  03/22/18 177 lb (80.3 kg)  09/15/16 172 lb (78 kg)     Objective:    Vital Signs:  Ht 5\' 2"  (1.575 m)   Wt 170 lb (77.1 kg)   BMI 31.09 kg/m    Unable to obtain vital signs today. Patient spoke in full sentences, not short of breath. No audible wheezing or coughing.  ASSESSMENT & PLAN:    1.  PSVT, doing well without significant palpitations on Cardizem CD 180 mg daily.  Obtain ECG for next visit.  2.  Hypothyroidism, she continues on Synthroid with follow-up by Dr. Nevada Crane.   Time:   Today, I have spent 5 minutes with the patient with telehealth technology discussing the above problems.     Medication Adjustments/Labs and Tests Ordered: Current medicines are reviewed at length with the patient today.  Concerns regarding medicines are outlined above.   Tests Ordered: No orders of the defined types were placed in this encounter.   Medication Changes: No orders of the defined types were placed in this encounter.   Follow Up:  In Person 1 year in the [facility] office.  Signed, Rozann Lesches, MD  05/16/2019 3:20 PM    [place]

## 2019-05-17 DIAGNOSIS — F3181 Bipolar II disorder: Secondary | ICD-10-CM | POA: Diagnosis not present

## 2019-06-14 ENCOUNTER — Telehealth: Payer: Self-pay | Admitting: Cardiology

## 2019-06-14 DIAGNOSIS — E782 Mixed hyperlipidemia: Secondary | ICD-10-CM | POA: Diagnosis not present

## 2019-06-14 DIAGNOSIS — D6949 Other primary thrombocytopenia: Secondary | ICD-10-CM | POA: Diagnosis not present

## 2019-06-14 DIAGNOSIS — E871 Hypo-osmolality and hyponatremia: Secondary | ICD-10-CM | POA: Diagnosis not present

## 2019-06-14 DIAGNOSIS — D649 Anemia, unspecified: Secondary | ICD-10-CM | POA: Diagnosis not present

## 2019-06-14 DIAGNOSIS — E0590 Thyrotoxicosis, unspecified without thyrotoxic crisis or storm: Secondary | ICD-10-CM | POA: Diagnosis not present

## 2019-06-14 DIAGNOSIS — B373 Candidiasis of vulva and vagina: Secondary | ICD-10-CM | POA: Diagnosis not present

## 2019-06-14 DIAGNOSIS — F331 Major depressive disorder, recurrent, moderate: Secondary | ICD-10-CM | POA: Diagnosis not present

## 2019-06-14 DIAGNOSIS — N390 Urinary tract infection, site not specified: Secondary | ICD-10-CM | POA: Diagnosis not present

## 2019-06-14 DIAGNOSIS — R5383 Other fatigue: Secondary | ICD-10-CM | POA: Diagnosis not present

## 2019-06-14 DIAGNOSIS — D519 Vitamin B12 deficiency anemia, unspecified: Secondary | ICD-10-CM | POA: Diagnosis not present

## 2019-06-14 DIAGNOSIS — D509 Iron deficiency anemia, unspecified: Secondary | ICD-10-CM | POA: Diagnosis not present

## 2019-06-14 DIAGNOSIS — E039 Hypothyroidism, unspecified: Secondary | ICD-10-CM | POA: Diagnosis not present

## 2019-06-14 DIAGNOSIS — E7849 Other hyperlipidemia: Secondary | ICD-10-CM | POA: Diagnosis not present

## 2019-06-14 DIAGNOSIS — R350 Frequency of micturition: Secondary | ICD-10-CM | POA: Diagnosis not present

## 2019-06-14 DIAGNOSIS — K219 Gastro-esophageal reflux disease without esophagitis: Secondary | ICD-10-CM | POA: Diagnosis not present

## 2019-06-14 DIAGNOSIS — E559 Vitamin D deficiency, unspecified: Secondary | ICD-10-CM | POA: Diagnosis not present

## 2019-06-14 NOTE — Telephone Encounter (Signed)
Returned pt call. No answer. Left msg to call back.  

## 2019-06-14 NOTE — Telephone Encounter (Signed)
Pt says SOB lasted 30 mins yesterday - pt is anxious - went to pcp this afternoon for UTI and they are also checking labs since pt c/o fatigue - denies chest pain/dizziness/swelling - doesn't know what HR/BP has been - aware to continue to monitor symptoms and if worsen or SOB become more frequent to call us back

## 2019-06-14 NOTE — Telephone Encounter (Signed)
Patient left voicemail stating she had severe SOB yesterday for about 30 mins and wanted Dr. Domenic Polite to be aware-- stated she has had a UTI but wasn't sure if it had anything to do with that.   Please give pt a call @ [phone]

## 2019-06-18 ENCOUNTER — Telehealth: Payer: Self-pay

## 2019-06-18 ENCOUNTER — Ambulatory Visit: Payer: PPO

## 2019-06-18 ENCOUNTER — Other Ambulatory Visit: Payer: Self-pay

## 2019-06-18 ENCOUNTER — Encounter: Payer: Self-pay | Admitting: Cardiology

## 2019-06-18 ENCOUNTER — Ambulatory Visit: Payer: PPO | Admitting: Cardiology

## 2019-06-18 VITALS — BP 128/72 | HR 76 | Ht 62.0 in | Wt 178.6 lb

## 2019-06-18 DIAGNOSIS — I471 Supraventricular tachycardia, unspecified: Secondary | ICD-10-CM

## 2019-06-18 DIAGNOSIS — I10 Essential (primary) hypertension: Secondary | ICD-10-CM | POA: Diagnosis not present

## 2019-06-18 DIAGNOSIS — R0602 Shortness of breath: Secondary | ICD-10-CM | POA: Diagnosis not present

## 2019-06-18 NOTE — Telephone Encounter (Signed)
New 7 Day Zio patch placed on the pt.  [id number] per Dr. Domenic Polite.   Will forward to the Monitor dept to have it registered.   Placed 06/18/19 at 3:15 pm

## 2019-06-18 NOTE — Patient Instructions (Signed)
Medication Instructions:  Your physician recommends that you continue on your current medications as directed. Please refer to the Current Medication list given to you today.  *If you need a refill on your cardiac medications before your next appointment, please call your pharmacy*   Lab Work: none If you have labs (blood work) drawn today and your tests are completely normal, you will receive your results only by: Marland Kitchen MyChart Message (if you have MyChart) OR . A paper copy in the mail If you have any lab test that is abnormal or we need to change your treatment, we will call you to review the results.   Testing/Procedures: Your physician has recommended that you wear a Zio Patch. A Zio Patch is a medical devices that record the heart's electrical activity. Doctors most often use these monitors to diagnose arrhythmias. Arrhythmias are problems with the speed or rhythm of the heartbeat. The monitor is a small, portable device. You can wear one while you do your normal daily activities. This is usually used to diagnose what is causing palpitations/syncope (passing out).  Your physician has requested that you have an echocardiogram. Echocardiography is a painless test that uses sound waves to create images of your heart. It provides your doctor with information about the size and shape of your heart and how well your heart's chambers and valves are working. This procedure takes approximately one hour. There are no restrictions for this procedure.     Follow-Up: At [facility], you and your health needs are our priority.  As part of our continuing mission to provide you with exceptional heart care, we have created designated Provider Care Teams.  These Care Teams include your primary Cardiologist (physician) and Advanced Practice Providers (APPs -  Physician Assistants and Nurse Practitioners) who all work together to provide you with the care you need, when you need it.  We recommend signing  up for the patient portal called "MyChart".  Sign up information is provided on this After Visit Summary.  MyChart is used to connect with patients for Virtual Visits (Telemedicine).  Patients are able to view lab/test results, encounter notes, upcoming appointments, etc.  Non-urgent messages can be sent to your provider as well.   To learn more about what you can do with MyChart, go to [email or web address].    Your next appointment: We will call after testing.

## 2019-06-18 NOTE — Telephone Encounter (Signed)
Pt states SOB has returned  Please call [phone]  Thanks renee

## 2019-06-18 NOTE — Telephone Encounter (Signed)
Pt has an appt today with Dr. Domenic Polite at 2pm.

## 2019-06-18 NOTE — Progress Notes (Signed)
Cardiology Office Note  Date: 06/18/2019   ID: Cathalina, Damp 06-10-1945, MRN [id number]  PCP:  Celene Squibb, MD  Cardiologist:  Rozann Lesches, MD Electrophysiologist:  None   Chief Complaint  Patient presents with  . Cardiac follow-up    History of Present Illness: RANNAH HODGKINS is a 74 y.o. adult last assessed via telehealth encounter in April.  She presents to the office describing recent episodes of sudden onset breathlessness.  She has had 3 distinct events within a week, the first 2 occurred while she was in the shower, the second 1 when she was at a store.  She states that she feels at baseline and then suddenly gets very short of breath and fatigued, not necessarily with a typical sense of palpitations as with prior episodes of PSVT, but suddenly within a few minutes feels back to baseline.  She has had no syncope.  She continues on stable medications, Cardizem CD 180 mg daily.  Echocardiogram from 2013 as outlined below, LVEF 65 to 70% at that point, PASP estimated at 34 mmHg.  Past Medical History:  Diagnosis Date  . Allergic rhinitis   . B12 deficiency   . Bipolar disorder ([place])   . Essential hypertension   . GERD (gastroesophageal reflux disease)   . Hashimoto's thyroiditis   . History of transient ischemic attack (TIA)    Or possibly migraine as well as right amaurosis fugax and ataxia - Dr. Erling Cruz  . Hyperlipidemia   . PSVT (paroxysmal supraventricular tachycardia) ([place])     Past Surgical History:  Procedure Laterality Date  . LAPAROSCOPIC CHOLECYSTECTOMY  2010  . THYROIDECTOMY  2009    Current Outpatient Medications  Medication Sig Dispense Refill  . Ascorbic Acid (VITAMIN C PO) Take 1 tablet by mouth daily.    Marland Kitchen aspirin EC 81 MG tablet Take 81 mg by mouth daily.    Marland Kitchen buPROPion (WELLBUTRIN XL) 300 MG 24 hr tablet Take 300 mg by mouth daily.     . calcium carbonate (TUMS - DOSED IN MG ELEMENTAL CALCIUM) 500 MG chewable tablet Chew 4 tablets by  mouth daily.    . clonazePAM (KLONOPIN) 1 MG tablet Take 1 mg by mouth at bedtime. May take up to 3 times daily as needed for anxiety    . dexlansoprazole (DEXILANT) 60 MG capsule Take 60 mg by mouth at bedtime.     Marland Kitchen diltiazem (CARDIZEM CD) 180 MG 24 hr capsule TAKE (1) CAPSULE BY MOUTH ONCE DAILY. 30 capsule 11  . DULoxetine (CYMBALTA) 60 MG capsule Take 60 mg by mouth 2 (two) times daily.     . famotidine (PEPCID) 10 MG tablet Take 10 mg by mouth 2 (two) times daily.    Marland Kitchen losartan (COZAAR) 50 MG tablet Take 50 mg by mouth daily.    . Multiple Vitamin (MULTIVITAMIN) tablet Take 1 tablet by mouth daily.    . Polysaccharide Iron Complex (IRON UP) 15 MG/0.5ML LIQD Take 15 mLs by mouth daily.    . Saccharomyces boulardii (FLORASTOR PO) Take 1 tablet by mouth in the morning and at bedtime.    Marland Kitchen SYNTHROID 125 MCG tablet Take 125 mcg by mouth every morning.    . vitamin B-12 (CYANOCOBALAMIN) 500 MCG tablet Take 500 mcg by mouth daily.     No current facility-administered medications for this visit.   Allergies:  Penicillins, Amoxicillin-pot clavulanate, Atenolol, and Sulfonamide derivatives   ROS:   No syncope.  Physical Exam: VS:  BP 128/72   Pulse 76   Ht 5\' 2"  (1.575 m)   Wt 178 lb 9.6 oz (81 kg)   SpO2 96%   BMI 32.67 kg/m , BMI Body mass index is 32.67 kg/m.  Wt Readings from Last 3 Encounters:  06/18/19 178 lb 9.6 oz (81 kg)  05/16/19 170 lb (77.1 kg)  03/22/18 177 lb (80.3 kg)    General: Patient appears comfortable at rest. HEENT: Conjunctiva and lids normal, wearing a mask. Neck: Supple, no elevated JVP or carotid bruits, no thyromegaly. Lungs: Clear to auscultation, nonlabored breathing at rest. Cardiac: Regular rate and rhythm, no S3, soft systolic murmur. Abdomen: Soft, bowel sounds present, no guarding or rebound. Extremities: No pitting edema, distal pulses 2+.  ECG:  An ECG dated 03/22/2018 was personally reviewed today and demonstrated:  Normal sinus rhythm with  right bundle branch block.  Recent Labwork:  No interval lab work for review today.  Other Studies Reviewed Today:  Echocardiogram 02/11/2011: Study Conclusions  - Left ventricle: The cavity size was normal. Wall thickness was at the upper limits of normal. Systolic function was vigorous. The estimated ejection fraction was in the range of 65% to 70%. Wall motion was normal; there were no regional wall motion abnormalities. The study is not technically sufficient to allow evaluation of LV diastolic function. - Mitral valve: Trivial regurgitation. - Atrial septum: No clear defect or patent foramen ovale was identified. - Tricuspid valve: Mild regurgitation. - Pulmonary arteries: PA peak pressure: 44mm Hg (S). - Pericardium, extracardiac: There was no pericardial effusion.  Assessment and Plan:  1.  Episodic breathlessness with fairly sudden onset and offset raising likelihood of recurring PSVT although she does not specifically report palpitations each time.  Plan is to obtain a 7-day ZIO AT to investigate paroxysmal arrhythmia, also follow-up echocardiogram to reevaluate cardiac structure and function in comparison to prior study from 2013.  For now continue Cardizem CD at 180 mg daily.  2.  Essential hypertension by history, systolic blood pressure 0000000 today.  She is also on Cozaar.  Medication Adjustments/Labs and Tests Ordered: Current medicines are reviewed at length with the patient today.  Concerns regarding medicines are outlined above.   Tests Ordered: Orders Placed This Encounter  Procedures  . LONG TERM MONITOR (3-14 DAYS)  . ECHOCARDIOGRAM COMPLETE    Medication Changes: No orders of the defined types were placed in this encounter.   Disposition:  Follow up test results and determine disposition.  Signed, Satira Sark, MD, [facility] 06/18/2019 3:04 PM    [facility] [address], [place], [place]  [address] Phone: [phone]; Fax: [phone]

## 2019-06-19 DIAGNOSIS — F332 Major depressive disorder, recurrent severe without psychotic features: Secondary | ICD-10-CM | POA: Diagnosis not present

## 2019-06-20 NOTE — Telephone Encounter (Signed)
Pt has questions [id number]  Please call [phone]  Thanks renee

## 2019-06-20 NOTE — Telephone Encounter (Signed)
Pt had questions regarding the diary and if she should use it.She has not been doing so but will use this feature ongoing

## 2019-06-26 NOTE — Telephone Encounter (Signed)
I am glad that you were able to work things out and get her scheduled for a new PCP evaluation in the next few weeks.  I think that would be a good opportunity to address the questions you have about her electrolytes and anemia.

## 2019-06-29 DIAGNOSIS — F332 Major depressive disorder, recurrent severe without psychotic features: Secondary | ICD-10-CM | POA: Diagnosis not present

## 2019-07-02 ENCOUNTER — Other Ambulatory Visit: Payer: Self-pay

## 2019-07-02 ENCOUNTER — Ambulatory Visit (HOSPITAL_COMMUNITY)
Admission: RE | Admit: 2019-07-02 | Discharge: 2019-07-02 | Disposition: A | Discharge status: Home / Self Care | Payer: PPO | Source: Ambulatory Visit | Attending: Cardiology | Admitting: Cardiology

## 2019-07-02 DIAGNOSIS — I471 Supraventricular tachycardia, unspecified: Secondary | ICD-10-CM

## 2019-07-02 NOTE — Progress Notes (Signed)
*  PRELIMINARY RESULTS* Echocardiogram 2D Echocardiogram has been performed.  [name] 07/02/2019, 2:30 PM

## 2019-07-04 ENCOUNTER — Telehealth: Payer: Self-pay

## 2019-07-04 NOTE — Telephone Encounter (Signed)
Pt is asking for monitor results   Please call [phone]   Thanks renee

## 2019-07-05 NOTE — Telephone Encounter (Signed)
Returned pt call, no answer. Left msg to call back.

## 2019-07-05 NOTE — Telephone Encounter (Signed)
Pt notified that monitor results have not been received by office at this time. Pt notified that she will be called when results are uploaded.  Pt voiced understanding and thankful for update.

## 2019-07-10 ENCOUNTER — Telehealth: Payer: Self-pay

## 2019-07-10 NOTE — Telephone Encounter (Signed)
Pt requesting Heart Study results   PLerase call [phone]   Thanks renee

## 2019-07-10 NOTE — Telephone Encounter (Signed)
Returned pt call. No answer. No voicemail. Will try later.

## 2019-07-11 ENCOUNTER — Other Ambulatory Visit: Payer: Self-pay | Admitting: *Deleted

## 2019-07-11 ENCOUNTER — Ambulatory Visit (INDEPENDENT_AMBULATORY_CARE_PROVIDER_SITE_OTHER): Payer: PPO

## 2019-07-11 ENCOUNTER — Other Ambulatory Visit: Payer: Self-pay

## 2019-07-11 ENCOUNTER — Telehealth: Payer: Self-pay

## 2019-07-11 DIAGNOSIS — I471 Supraventricular tachycardia, unspecified: Secondary | ICD-10-CM

## 2019-07-11 NOTE — Telephone Encounter (Signed)
Call to update pt on monitor results. No answer. Left msg to call back.

## 2019-07-11 NOTE — Telephone Encounter (Signed)
Patient would like monitor results  Please call [phone]   Thanks renee

## 2019-07-12 DIAGNOSIS — H25812 Combined forms of age-related cataract, left eye: Secondary | ICD-10-CM | POA: Diagnosis not present

## 2019-07-12 NOTE — Telephone Encounter (Signed)
[name] spoke with patient earlier today

## 2019-07-12 NOTE — Telephone Encounter (Signed)
Returned call to pt. No answer. Left msg to call back.  

## 2019-07-12 NOTE — Telephone Encounter (Signed)
Spoke with pt. Results given for echo. Pt notified that monitor results downloaded on yesterday and we will call when available. Pt voiced understanding and thankful for the call.

## 2019-07-16 ENCOUNTER — Telehealth: Payer: Self-pay

## 2019-07-16 MED ORDER — DILTIAZEM HCL ER COATED BEADS 240 MG PO CP24: 240.0000 mg | ORAL_CAPSULE | Freq: Every day | ORAL | 3 refills | Status: DC

## 2019-07-16 NOTE — Telephone Encounter (Signed)
-----   Message from Satira Sark, MD sent at 07/15/2019  7:54 PM EDT ----- Results reviewed.  Episodes of PSVT noted, longest 1 lasted for nearly 35 minutes.  I suspect this is contributing to the symptoms that we discussed at her recent office visit.  Try and increase Cardizem CD to 240 mg daily.  Schedule 6-week follow-up.

## 2019-07-16 NOTE — Telephone Encounter (Signed)
Results given, will start Cardizem 240 mg qd, e-scribed to [facility]. will make 6 week f/u

## 2019-07-17 DIAGNOSIS — E559 Vitamin D deficiency, unspecified: Secondary | ICD-10-CM | POA: Diagnosis not present

## 2019-07-17 DIAGNOSIS — R002 Palpitations: Secondary | ICD-10-CM | POA: Diagnosis not present

## 2019-07-17 DIAGNOSIS — K582 Mixed irritable bowel syndrome: Secondary | ICD-10-CM | POA: Diagnosis not present

## 2019-07-17 DIAGNOSIS — R5383 Other fatigue: Secondary | ICD-10-CM | POA: Diagnosis not present

## 2019-07-17 DIAGNOSIS — D649 Anemia, unspecified: Secondary | ICD-10-CM | POA: Diagnosis not present

## 2019-07-17 DIAGNOSIS — E538 Deficiency of other specified B group vitamins: Secondary | ICD-10-CM | POA: Diagnosis not present

## 2019-07-17 DIAGNOSIS — H6123 Impacted cerumen, bilateral: Secondary | ICD-10-CM | POA: Diagnosis not present

## 2019-07-17 DIAGNOSIS — R270 Ataxia, unspecified: Secondary | ICD-10-CM | POA: Diagnosis not present

## 2019-07-17 DIAGNOSIS — F39 Unspecified mood [affective] disorder: Secondary | ICD-10-CM | POA: Diagnosis not present

## 2019-07-17 DIAGNOSIS — M1712 Unilateral primary osteoarthritis, left knee: Secondary | ICD-10-CM | POA: Diagnosis not present

## 2019-07-17 DIAGNOSIS — E890 Postprocedural hypothyroidism: Secondary | ICD-10-CM | POA: Diagnosis not present

## 2019-07-17 DIAGNOSIS — I10 Essential (primary) hypertension: Secondary | ICD-10-CM | POA: Diagnosis not present

## 2019-07-17 DIAGNOSIS — R358 Other polyuria: Secondary | ICD-10-CM | POA: Diagnosis not present

## 2019-07-19 ENCOUNTER — Telehealth: Payer: Self-pay | Admitting: *Deleted

## 2019-07-19 NOTE — Telephone Encounter (Signed)
Patient was returning a call to [facility]. She can be reached at [phone]

## 2019-07-23 ENCOUNTER — Telehealth: Payer: Self-pay | Admitting: Cardiology

## 2019-07-23 NOTE — Telephone Encounter (Signed)
Spoke with daughter [name] who states she was unable to have blood pressure checked today. [name] states she will call with reading on tomorrow.

## 2019-07-23 NOTE — Telephone Encounter (Signed)
Please give pt's daughter a call concerning the My Chart message she sent  Please call [phone]

## 2019-07-23 NOTE — Telephone Encounter (Signed)
Patients daughter [name] called to tell [name] that she was having her mother's BP checked around 4:30 pm today. Stated that mother is not taking her Cardizm as it was making her dizzy. She just wanted to relay this message to [facility], said she might be able to call back with that reading before we close today.

## 2019-07-23 NOTE — Telephone Encounter (Signed)
With that reported blood pressure I am not certain that the increased dose of Cardizem would be necessarily be causing her symptoms unless she also had a particularly slow heart rate.  If she is substantially anemic, that could be contributing.

## 2019-07-24 ENCOUNTER — Telehealth: Payer: Self-pay | Admitting: Cardiology

## 2019-07-24 NOTE — Telephone Encounter (Signed)
Thank you.  I think that is reasonable.  Would keep an eye on heart rate as well however.  Main concern would be to make sure that she is not too bradycardic.

## 2019-07-24 NOTE — Telephone Encounter (Signed)
Pt's BP yesterday was 124/66. Daughter unable to provide HR at this time. [name] states that the pt has decided to restart diltiazem and see if it still makes her dizzy. Please advise.

## 2019-07-24 NOTE — Telephone Encounter (Signed)
Daughter called and would like to give recent BP readings   Please call [name] [phone]   Thanks renee

## 2019-07-24 NOTE — Telephone Encounter (Signed)
BP received and given to Dr. Domenic Polite.

## 2019-07-27 NOTE — Telephone Encounter (Signed)
Pt notified and voiced understanding 

## 2019-07-27 NOTE — Telephone Encounter (Signed)
Please give pt a call concerning BP readings --[phone]

## 2019-07-27 NOTE — Telephone Encounter (Signed)
Thank you for the update.  None of these pressures are too low, would continue with the current plan.

## 2019-07-27 NOTE — Telephone Encounter (Signed)
Spoke with pt who called to give BP readings. Please advise.  6/29 117/65          127/83 6/30  130/77          140/89 7/1    130/70          130/85  7/2    125/72

## 2019-07-27 NOTE — Telephone Encounter (Signed)
Returned call to pt. No answer. Left msg to call back.  

## 2019-07-31 DIAGNOSIS — M1711 Unilateral primary osteoarthritis, right knee: Secondary | ICD-10-CM | POA: Diagnosis not present

## 2019-07-31 DIAGNOSIS — M1712 Unilateral primary osteoarthritis, left knee: Secondary | ICD-10-CM | POA: Diagnosis not present

## 2019-07-31 DIAGNOSIS — M179 Osteoarthritis of knee, unspecified: Secondary | ICD-10-CM | POA: Diagnosis not present

## 2019-07-31 DIAGNOSIS — M170 Bilateral primary osteoarthritis of knee: Secondary | ICD-10-CM | POA: Diagnosis not present

## 2019-08-04 ENCOUNTER — Ambulatory Visit
Admission: EM | Admit: 2019-08-04 | Discharge: 2019-08-04 | Disposition: A | Discharge status: Home / Self Care | Payer: PPO | Attending: Emergency Medicine | Admitting: Emergency Medicine

## 2019-08-04 ENCOUNTER — Other Ambulatory Visit: Payer: Self-pay

## 2019-08-04 ENCOUNTER — Encounter: Payer: Self-pay | Admitting: Emergency Medicine

## 2019-08-04 DIAGNOSIS — H8110 Benign paroxysmal vertigo, unspecified ear: Secondary | ICD-10-CM

## 2019-08-04 DIAGNOSIS — J0100 Acute maxillary sinusitis, unspecified: Secondary | ICD-10-CM | POA: Diagnosis not present

## 2019-08-04 LAB — POCT RAPID STREP A (OFFICE): Rapid Strep A Screen: NEGATIVE

## 2019-08-04 MED ORDER — DOXYCYCLINE HYCLATE 100 MG PO CAPS: 100.0000 mg | ORAL_CAPSULE | Freq: Two times a day (BID) | ORAL | 0 refills | Status: DC

## 2019-08-04 MED ORDER — PREDNISONE 20 MG PO TABS: 20.0000 mg | ORAL_TABLET | Freq: Two times a day (BID) | ORAL | 0 refills | Status: AC

## 2019-08-04 NOTE — ED Triage Notes (Signed)
Over 1 week pt has been having dizziness, with right ear pain, sore throat. Has been taking meclizine.

## 2019-08-04 NOTE — ED Provider Notes (Signed)
[place]   [id number] 08/04/19 Arrival Time: 5027   CC: URI  SUBJECTIVE: History from: patient.  [name] is a 74 y.o. female who presents with sinus pain, pressure, nasal congestion, sore throat, and cough x 1 week.  Denies sick exposure to COVID, flu or strep.  Denies recent travel.   Has tried OTC medications without relief.  Denies aggravating factors.  Reports previous symptoms in the past.   Complains of associated dizziness that is intermittent and feels like the room is spinning.  Denies fever, chills, SOB, wheezing, chest pain, nausea, changes in bowel or bladder habits.    ROS: As per HPI.  All other pertinent ROS negative.     Past Medical History:  Diagnosis Date   Allergic rhinitis    B12 deficiency    Bipolar disorder ([place])    Essential hypertension    GERD (gastroesophageal reflux disease)    Hashimoto's thyroiditis    History of transient ischemic attack (TIA)    Or possibly migraine as well as right amaurosis fugax and ataxia - Dr. Erling Cruz   Hyperlipidemia    PSVT (paroxysmal supraventricular tachycardia) [facility])    Past Surgical History:  Procedure Laterality Date   LAPAROSCOPIC CHOLECYSTECTOMY  2010   THYROIDECTOMY  2009   Allergies  Allergen Reactions   Penicillins     rash   Amoxicillin-Pot Clavulanate Rash   Atenolol Rash   Sulfonamide Derivatives Rash   No current facility-administered medications on file prior to encounter.   Current Outpatient Medications on File Prior to Encounter  Medication Sig Dispense Refill   Ascorbic Acid (VITAMIN C PO) Take 1 tablet by mouth daily.     aspirin EC 81 MG tablet Take 81 mg by mouth daily.     buPROPion (WELLBUTRIN XL) 300 MG 24 hr tablet Take 300 mg by mouth daily.      calcium carbonate (TUMS - DOSED IN MG ELEMENTAL CALCIUM) 500 MG chewable tablet Chew 4 tablets by mouth daily.     clonazePAM (KLONOPIN) 1 MG tablet Take 1 mg by mouth at bedtime. May take up to 3 times  daily as needed for anxiety     dexlansoprazole (DEXILANT) 60 MG capsule Take 60 mg by mouth at bedtime.      diltiazem (CARDIZEM CD) 240 MG 24 hr capsule Take 1 capsule (240 mg total) by mouth daily. 90 capsule 3   DULoxetine (CYMBALTA) 60 MG capsule Take 60 mg by mouth 2 (two) times daily.      famotidine (PEPCID) 10 MG tablet Take 10 mg by mouth 2 (two) times daily.     losartan (COZAAR) 50 MG tablet Take 50 mg by mouth daily.     meloxicam (MOBIC) 15 MG tablet TAKE ONE TABLET BY MOUTHMONCE DAILY FOR 2 WEEKS; THEN AS NEEDED.     Multiple Vitamin (MULTIVITAMIN) tablet Take 1 tablet by mouth daily.     Polysaccharide Iron Complex (IRON UP) 15 MG/0.5ML LIQD Take 15 mLs by mouth daily.     Saccharomyces boulardii (FLORASTOR PO) Take 1 tablet by mouth in the morning and at bedtime.     SYNTHROID 125 MCG tablet Take 125 mcg by mouth every morning.     vitamin B-12 (CYANOCOBALAMIN) 500 MCG tablet Take 500 mcg by mouth daily.     Social History   Socioeconomic History   Marital status: Married    Spouse name: Not on file   Number of children: 2   Years of education: Not  on file   Highest education level: Not on file  Occupational History   Occupation: homemaker    Employer: RETIRED  Tobacco Use   Smoking status: Former Smoker    Packs/day: 0.50    Years: 10.00    Pack years: 5.00    Types: Cigarettes    Quit date: 01/26/1983    Years since quitting: 36.5   Smokeless tobacco: Never Used   Tobacco comment: quit 30 years ago  Vaping Use   Vaping Use: Never used  Substance and Sexual Activity   Alcohol use: No    Alcohol/week: 0.0 standard drinks   Drug use: No   Sexual activity: Never  Other Topics Concern   Not on file  Social History Narrative   Regular exercise: walk when able   Caffeine use: occasionally   Social Determinants of Health   Financial Resource Strain:    Difficulty of Paying Living Expenses:   Food Insecurity:    Worried About  Charity fundraiser in the Last Year:    Arboriculturist in the Last Year:   Transportation Needs:    Film/video editor (Medical):    Lack of Transportation (Non-Medical):   Physical Activity:    Days of Exercise per Week:    Minutes of Exercise per Session:   Stress:    Feeling of Stress :   Social Connections:    Frequency of Communication with Friends and Family:    Frequency of Social Gatherings with Friends and Family:    Attends Religious Services:    Active Member of Clubs or Organizations:    Attends Music therapist:    Marital Status:   Intimate Partner Violence:    Fear of Current or Ex-Partner:    Emotionally Abused:    Physically Abused:    Sexually Abused:    Family History  Problem Relation Age of Onset   Stroke Mother    Thyroid disease Mother    Depression Father    Hypertension Sister    Aneurysm Sister    Diabetes Brother    Heart attack Brother    Hypertension Brother    Heart failure Brother    Thyroid disease Brother    Stroke Maternal Grandmother    Heart failure Maternal Grandmother    Heart failure Maternal Grandfather    Stroke Maternal Grandfather    Heart failure Paternal Grandmother     OBJECTIVE:  Vitals:   08/04/19 1503 08/04/19 1504  BP: (!) 148/69   Pulse: 68   Resp: 16   Temp: 98.1 F (36.7 C)   TempSrc: Oral   SpO2: 93%   Weight:  180 lb (81.6 kg)    General appearance: alert; appears mildly fatigued, but nontoxic; speaking in full sentences and tolerating own secretions HEENT: NCAT; Ears: EACs clear, TMs pearly gray; Eyes: PERRL.  EOM grossly intact. Sinuses: TTP; Nose: nares patent without rhinorrhea, Throat: oropharynx clear, tonsils non erythematous or enlarged, uvula midline  Neck: supple without LAD Lungs: unlabored respirations, symmetrical air entry; cough: absent; no respiratory distress; CTAB Heart: regular rate and rhythm.   Skin: warm and dry Psychological:  alert and cooperative; normal mood and affect  LABS:  Strep negative   ASSESSMENT & PLAN:  1. Acute non-recurrent maxillary sinusitis   2. Benign paroxysmal positional vertigo, unspecified laterality     Meds ordered this encounter  Medications   predniSONE (DELTASONE) 20 MG tablet    Sig: Take 1 tablet (20 mg  total) by mouth 2 (two) times daily with a meal for 5 days.    Dispense:  10 tablet    Refill:  0    Order Specific Question:   Supervising Provider    Answer:   Raylene Everts [[id number]]   doxycycline (VIBRAMYCIN) 100 MG capsule    Sig: Take 1 capsule (100 mg total) by mouth 2 (two) times daily.    Dispense:  20 capsule    Refill:  0    Order Specific Question:   Supervising Provider    Answer:   Raylene Everts [[id number]]   COVID testing ordered.  It will take between 2-5 days for test results.  Someone will contact you regarding abnormal results.    In the meantime: You should remain isolated in your home for 10 days from symptom onset AND greater than 72 hours after symptoms resolution (absence of fever without the use of fever-reducing medication and improvement in respiratory symptoms), whichever is longer Get plenty of rest and push fluids Prednisone and doxycycline prescribed.  Take as directed and to completion Use medications daily for symptom relief Use OTC medications like ibuprofen or tylenol as needed fever or pain Call or go to the ED if you have any new or worsening symptoms such as fever, cough, shortness of breath, chest tightness, chest pain, turning blue, changes in mental status, etc...   Reviewed expectations re: course of current medical issues. Questions answered. Outlined signs and symptoms indicating need for more acute intervention. Patient verbalized understanding. After Visit Summary given.         Lestine Box, PA-C 08/04/19 1524

## 2019-08-04 NOTE — Discharge Instructions (Signed)
COVID testing ordered.  It will take between 2-5 days for test results.  Someone will contact you regarding abnormal results.    In the meantime: You should remain isolated in your home for 10 days from symptom onset AND greater than 72 hours after symptoms resolution (absence of fever without the use of fever-reducing medication and improvement in respiratory symptoms), whichever is longer Get plenty of rest and push fluids Prednisone and doxycycline prescribed.  Take as directed and to completion Use medications daily for symptom relief Use OTC medications like ibuprofen or tylenol as needed fever or pain Call or go to the ED if you have any new or worsening symptoms such as fever, cough, shortness of breath, chest tightness, chest pain, turning blue, changes in mental status, etc..Marland Kitchen

## 2019-08-07 LAB — CULTURE, GROUP A STREP (THRC)

## 2019-08-09 DIAGNOSIS — R142 Eructation: Secondary | ICD-10-CM | POA: Diagnosis not present

## 2019-08-09 DIAGNOSIS — K5900 Constipation, unspecified: Secondary | ICD-10-CM | POA: Diagnosis not present

## 2019-08-09 DIAGNOSIS — D509 Iron deficiency anemia, unspecified: Secondary | ICD-10-CM | POA: Diagnosis not present

## 2019-08-09 DIAGNOSIS — R140 Abdominal distension (gaseous): Secondary | ICD-10-CM | POA: Diagnosis not present

## 2019-08-09 DIAGNOSIS — K219 Gastro-esophageal reflux disease without esophagitis: Secondary | ICD-10-CM | POA: Diagnosis not present

## 2019-08-22 ENCOUNTER — Ambulatory Visit
Admission: EM | Admit: 2019-08-22 | Discharge: 2019-08-22 | Disposition: A | Discharge status: Home / Self Care | Payer: PPO | Attending: Emergency Medicine | Admitting: Emergency Medicine

## 2019-08-22 ENCOUNTER — Encounter: Payer: Self-pay | Admitting: Emergency Medicine

## 2019-08-22 DIAGNOSIS — R319 Hematuria, unspecified: Secondary | ICD-10-CM | POA: Diagnosis not present

## 2019-08-22 DIAGNOSIS — R109 Unspecified abdominal pain: Secondary | ICD-10-CM

## 2019-08-22 LAB — POCT URINALYSIS DIP (MANUAL ENTRY)
Glucose, UA: NEGATIVE mg/dL
Ketones, POC UA: NEGATIVE mg/dL
Leukocytes, UA: NEGATIVE
Nitrite, UA: NEGATIVE
Protein Ur, POC: NEGATIVE mg/dL
Spec Grav, UA: 1.01 (ref 1.010–1.025)
Urobilinogen, UA: 1 E.U./dL
pH, UA: 6 (ref 5.0–8.0)

## 2019-08-22 MED ORDER — IBUPROFEN 800 MG PO TABS: 800.0000 mg | ORAL_TABLET | Freq: Two times a day (BID) | ORAL | 0 refills | Status: DC | PRN

## 2019-08-22 MED ORDER — FLUCONAZOLE 200 MG PO TABS: ORAL_TABLET | ORAL | 0 refills | Status: DC

## 2019-08-22 MED ORDER — KETOROLAC TROMETHAMINE 30 MG/ML IJ SOLN
30.0000 mg | Freq: Once | INTRAMUSCULAR | Status: AC
  Administered 2019-08-22: 30 mg via INTRAMUSCULAR

## 2019-08-22 NOTE — ED Triage Notes (Signed)
RT flank pain and urinary frequency x 3weeks

## 2019-08-22 NOTE — Discharge Instructions (Signed)
Urine showed trace blood, but otherwise without obvious signs of infection We will culture your urine and follow up with you regarding abnormal results Drink plenty of fluids and get rest Toradol shot given in office Ibuprofen prescribed.   We will also cover for possible yeast infection based on symptoms.  Diflucan prescribed.  Take as directed and to completion Follow up with PCP if symptoms persist Return or go to ER if you have any new or worsening symptoms (difficulty urinating, blood in urine, pain that does not moderate with medication, fever, chills, abdominal pain, etc...)

## 2019-08-22 NOTE — ED Provider Notes (Signed)
[facility]   CC: Burning with urination  SUBJECTIVE:  [name] is a 74 y.o. female who complains of RT flank pain and urinary frequency x 3 weeks.  Radiates into RT groin. Patient denies a precipitating event, or trauma.  Localizes the pain to the RT flank.  Pain is 4/10.  Describes as intermittent and achy.  Denies alleviating or aggravating factors.  Admits to similar symptoms in the past with UTI.   Also mentions white vagina discharge and slight itching x couple of weeks.  Reports hx of yeast with similar symptoms.    Denies fever, chills, nausea, vomiting, abdominal pain, flank pain, hematuria.    LMP: No LMP recorded. Patient is postmenopausal.  ROS: As in HPI.  All other pertinent ROS negative.     Past Medical History:  Diagnosis Date  . Allergic rhinitis   . B12 deficiency   . Bipolar disorder ([place])   . Essential hypertension   . GERD (gastroesophageal reflux disease)   . Hashimoto's thyroiditis   . History of transient ischemic attack (TIA)    Or possibly migraine as well as right amaurosis fugax and ataxia - Dr. Erling Cruz  . Hyperlipidemia   . PSVT (paroxysmal supraventricular tachycardia) ([place])    Past Surgical History:  Procedure Laterality Date  . LAPAROSCOPIC CHOLECYSTECTOMY  2010  . THYROIDECTOMY  2009   Allergies  Allergen Reactions  . Penicillins     rash  . Amoxicillin-Pot Clavulanate Rash  . Atenolol Rash  . Sulfonamide Derivatives Rash   No current facility-administered medications on file prior to encounter.   Current Outpatient Medications on File Prior to Encounter  Medication Sig Dispense Refill  . Ascorbic Acid (VITAMIN C PO) Take 1 tablet by mouth daily.    Marland Kitchen aspirin EC 81 MG tablet Take 81 mg by mouth daily.    Marland Kitchen buPROPion (WELLBUTRIN XL) 300 MG 24 hr tablet Take 300 mg by mouth daily.     . calcium carbonate (TUMS - DOSED IN MG ELEMENTAL CALCIUM) 500 MG chewable tablet Chew 4 tablets by mouth daily.    . clonazePAM  (KLONOPIN) 1 MG tablet Take 1 mg by mouth at bedtime. May take up to 3 times daily as needed for anxiety    . dexlansoprazole (DEXILANT) 60 MG capsule Take 60 mg by mouth at bedtime.     Marland Kitchen diltiazem (CARDIZEM CD) 240 MG 24 hr capsule Take 1 capsule (240 mg total) by mouth daily. 90 capsule 3  . doxycycline (VIBRAMYCIN) 100 MG capsule Take 1 capsule (100 mg total) by mouth 2 (two) times daily. 20 capsule 0  . DULoxetine (CYMBALTA) 60 MG capsule Take 60 mg by mouth 2 (two) times daily.     . famotidine (PEPCID) 10 MG tablet Take 10 mg by mouth 2 (two) times daily.    Marland Kitchen losartan (COZAAR) 50 MG tablet Take 50 mg by mouth daily.    . meloxicam (MOBIC) 15 MG tablet TAKE ONE TABLET BY MOUTHMONCE DAILY FOR 2 WEEKS; THEN AS NEEDED.    . Multiple Vitamin (MULTIVITAMIN) tablet Take 1 tablet by mouth daily.    . Polysaccharide Iron Complex (IRON UP) 15 MG/0.5ML LIQD Take 15 mLs by mouth daily.    . Saccharomyces boulardii (FLORASTOR PO) Take 1 tablet by mouth in the morning and at bedtime.    Marland Kitchen SYNTHROID 125 MCG tablet Take 125 mcg by mouth every morning.    . vitamin B-12 (CYANOCOBALAMIN) 500 MCG tablet Take 500 mcg by  mouth daily.     Social History   Socioeconomic History  . Marital status: Married    Spouse name: Not on file  . Number of children: 2  . Years of education: Not on file  . Highest education level: Not on file  Occupational History  . Occupation: homemaker    Employer: RETIRED  Tobacco Use  . Smoking status: Former Smoker    Packs/day: 0.50    Years: 10.00    Pack years: 5.00    Types: Cigarettes    Quit date: 01/26/1983    Years since quitting: 36.5  . Smokeless tobacco: Never Used  . Tobacco comment: quit 30 years ago  Vaping Use  . Vaping Use: Never used  Substance and Sexual Activity  . Alcohol use: No    Alcohol/week: 0.0 standard drinks  . Drug use: No  . Sexual activity: Never  Other Topics Concern  . Not on file  Social History Narrative   Regular exercise:  walk when able   Caffeine use: occasionally   Social Determinants of Health   Financial Resource Strain:   . Difficulty of Paying Living Expenses:   Food Insecurity:   . Worried About Charity fundraiser in the Last Year:   . Arboriculturist in the Last Year:   Transportation Needs:   . Film/video editor (Medical):   Marland Kitchen Lack of Transportation (Non-Medical):   Physical Activity:   . Days of Exercise per Week:   . Minutes of Exercise per Session:   Stress:   . Feeling of Stress :   Social Connections:   . Frequency of Communication with Friends and Family:   . Frequency of Social Gatherings with Friends and Family:   . Attends Religious Services:   . Active Member of Clubs or Organizations:   . Attends Archivist Meetings:   Marland Kitchen Marital Status:   Intimate Partner Violence:   . Fear of Current or Ex-Partner:   . Emotionally Abused:   Marland Kitchen Physically Abused:   . Sexually Abused:    Family History  Problem Relation Age of Onset  . Stroke Mother   . Thyroid disease Mother   . Depression Father   . Hypertension Sister   . Aneurysm Sister   . Diabetes Brother   . Heart attack Brother   . Hypertension Brother   . Heart failure Brother   . Thyroid disease Brother   . Stroke Maternal Grandmother   . Heart failure Maternal Grandmother   . Heart failure Maternal Grandfather   . Stroke Maternal Grandfather   . Heart failure Paternal Grandmother     OBJECTIVE:  Vitals:   08/22/19 1223 08/22/19 1224  BP:  (!) 141/74  Pulse:  65  Resp:  17  Temp:  99.1 F (37.3 C)  TempSrc:  Temporal  SpO2:  95%  Weight: 178 lb 9.2 oz (81 kg)   Height: 5\' 2"  (1.575 m)    General appearance: Alert in no acute distress HEENT: NCAT.  Oropharynx clear.  Lungs: clear to auscultation bilaterally without adventitious breath sounds Heart: regular rate and rhythm.   Abdomen: soft; non-distended; no tenderness; bowel sounds present; no guarding Back: + mild RT sided CVA  tenderness Extremities: no edema; symmetrical with no gross deformities Skin: warm and dry Neurologic: Ambulates from chair to exam table without difficulty Psychological: alert and cooperative; normal mood and affect  Labs Reviewed  POCT URINALYSIS DIP (MANUAL ENTRY) - Abnormal; Notable for the  following components:      Result Value   Clarity, UA hazy (*)    Bilirubin, UA small (*)    Blood, UA trace-intact (*)    All other components within normal limits  URINE CULTURE    ASSESSMENT & PLAN:  1. Right flank pain   2. Hematuria, unspecified type     Meds ordered this encounter  Medications  . ketorolac (TORADOL) 30 MG/ML injection 30 mg  . ibuprofen (ADVIL) 800 MG tablet    Sig: Take 1 tablet (800 mg total) by mouth every 12 (twelve) hours as needed.    Dispense:  10 tablet    Refill:  0    Order Specific Question:   Supervising Provider    Answer:   Raylene Everts [[id number]]   Urine showed trace blood, but otherwise without obvious signs of infection We will culture your urine and follow up with you regarding abnormal results Drink plenty of fluids and get rest Toradol shot given in office Ibuprofen prescribed.   We will also cover for possible yeast infection based on symptoms.  Diflucan prescribed.  Take as directed and to completion Follow up with PCP if symptoms persist Return or go to ER if you have any new or worsening symptoms (difficulty urinating, blood in urine, pain that does not moderate with medication, fever, chills, abdominal pain, etc...)   Outlined signs and symptoms indicating need for more acute intervention. Patient verbalized understanding. After Visit Summary given.     Lestine Box, PA-C 08/22/19 1303

## 2019-08-23 LAB — URINE CULTURE: Culture: NO GROWTH

## 2019-08-28 DIAGNOSIS — H903 Sensorineural hearing loss, bilateral: Secondary | ICD-10-CM | POA: Diagnosis not present

## 2019-08-28 DIAGNOSIS — R42 Dizziness and giddiness: Secondary | ICD-10-CM | POA: Diagnosis not present

## 2019-08-28 DIAGNOSIS — H9201 Otalgia, right ear: Secondary | ICD-10-CM | POA: Diagnosis not present

## 2019-09-03 DIAGNOSIS — R42 Dizziness and giddiness: Secondary | ICD-10-CM | POA: Diagnosis not present

## 2019-09-06 DIAGNOSIS — F332 Major depressive disorder, recurrent severe without psychotic features: Secondary | ICD-10-CM | POA: Diagnosis not present

## 2019-09-12 DIAGNOSIS — H832X2 Labyrinthine dysfunction, left ear: Secondary | ICD-10-CM | POA: Diagnosis not present

## 2019-09-12 DIAGNOSIS — H9201 Otalgia, right ear: Secondary | ICD-10-CM | POA: Diagnosis not present

## 2019-09-12 DIAGNOSIS — R42 Dizziness and giddiness: Secondary | ICD-10-CM | POA: Diagnosis not present

## 2019-09-14 ENCOUNTER — Ambulatory Visit
Admission: EM | Admit: 2019-09-14 | Discharge: 2019-09-14 | Disposition: A | Discharge status: Home / Self Care | Payer: PPO | Attending: Family Medicine | Admitting: Family Medicine

## 2019-09-14 ENCOUNTER — Other Ambulatory Visit: Payer: Self-pay

## 2019-09-14 ENCOUNTER — Encounter: Payer: Self-pay | Admitting: Emergency Medicine

## 2019-09-14 DIAGNOSIS — Z5321 Procedure and treatment not carried out due to patient leaving prior to being seen by health care provider: Secondary | ICD-10-CM | POA: Diagnosis not present

## 2019-09-14 DIAGNOSIS — R350 Frequency of micturition: Secondary | ICD-10-CM | POA: Diagnosis not present

## 2019-09-14 DIAGNOSIS — N390 Urinary tract infection, site not specified: Secondary | ICD-10-CM

## 2019-09-14 LAB — POCT URINALYSIS DIP (MANUAL ENTRY)
Glucose, UA: NEGATIVE mg/dL
Leukocytes, UA: NEGATIVE
Nitrite, UA: NEGATIVE
Protein Ur, POC: 30 mg/dL — AB
Spec Grav, UA: >=1.03 — AB (ref 1.010–1.025)
Urobilinogen, UA: 0.2 E.U./dL
pH, UA: 5.5 (ref 5.0–8.0)

## 2019-09-14 NOTE — ED Triage Notes (Signed)
urinary frequency and urgnecy x 3 weeks denies pain or burning

## 2019-09-14 NOTE — ED Provider Notes (Signed)
Left without being seen   Faustino Congress, NP 09/14/19 1646

## 2019-09-16 LAB — URINE CULTURE

## 2019-09-24 DIAGNOSIS — Z1159 Encounter for screening for other viral diseases: Secondary | ICD-10-CM | POA: Diagnosis not present

## 2019-09-25 ENCOUNTER — Ambulatory Visit: Payer: PPO | Admitting: Cardiology

## 2019-09-25 ENCOUNTER — Ambulatory Visit (INDEPENDENT_AMBULATORY_CARE_PROVIDER_SITE_OTHER): Payer: PPO | Admitting: Cardiology

## 2019-09-25 ENCOUNTER — Encounter: Payer: Self-pay | Admitting: Cardiology

## 2019-09-25 VITALS — BP 130/70 | HR 70 | Ht 62.0 in | Wt 181.6 lb

## 2019-09-25 DIAGNOSIS — I10 Essential (primary) hypertension: Secondary | ICD-10-CM

## 2019-09-25 DIAGNOSIS — I471 Supraventricular tachycardia: Secondary | ICD-10-CM | POA: Diagnosis not present

## 2019-09-25 NOTE — Patient Instructions (Addendum)
Medication Instructions:    Your physician recommends that you continue on your current medications as directed. Please refer to the Current Medication list given to you today.  Labwork:  None  Testing/Procedures:  None  Follow-Up:  Your physician recommends that you schedule a follow-up appointment in: 6 months in [place].   Any Other Special Instructions Will Be Listed Below (If Applicable).  If you need a refill on your cardiac medications before your next appointment, please call your pharmacy. 

## 2019-09-25 NOTE — Progress Notes (Signed)
Cardiology Office Note  Date: 09/25/2019   ID: [name], DOB May 06, 1945, MRN [id number]  PCP:  Caren Macadam, MD  Cardiologist:  Rozann Lesches, MD Electrophysiologist:  None   Chief Complaint  Patient presents with  . Cardiac follow-up    History of Present Illness: [name] is a 74 y.o. female last seen in May.  She presents for a routine visit.  States that since we increased her Cardizem CD to 240 mg daily she has done much better in terms of palpitations.  We will continue to manage PSVT medically at this point.  Echocardiogram and cardiac monitor results from June are outlined below.  Past Medical History:  Diagnosis Date  . Allergic rhinitis   . B12 deficiency   . Bipolar disorder ([place])   . Essential hypertension   . GERD (gastroesophageal reflux disease)   . Hashimoto's thyroiditis   . History of transient ischemic attack (TIA)    Or possibly migraine as well as right amaurosis fugax and ataxia - Dr. Erling Cruz  . Hyperlipidemia   . PSVT (paroxysmal supraventricular tachycardia) ([place])     Past Surgical History:  Procedure Laterality Date  . LAPAROSCOPIC CHOLECYSTECTOMY  2010  . THYROIDECTOMY  2009    Current Outpatient Medications  Medication Sig Dispense Refill  . Ascorbic Acid (VITAMIN C PO) Take 1 tablet by mouth daily.    Marland Kitchen aspirin EC 81 MG tablet Take 81 mg by mouth daily.    Marland Kitchen buPROPion (WELLBUTRIN XL) 300 MG 24 hr tablet Take 300 mg by mouth daily.     . calcium carbonate (TUMS - DOSED IN MG ELEMENTAL CALCIUM) 500 MG chewable tablet Chew 4 tablets by mouth daily.    . clonazePAM (KLONOPIN) 1 MG tablet Take 1 mg by mouth at bedtime. May take up to 3 times daily as needed for anxiety    . diltiazem (CARDIZEM CD) 240 MG 24 hr capsule Take 1 capsule (240 mg total) by mouth daily. 90 capsule 3  . DULoxetine (CYMBALTA) 60 MG capsule Take 60 mg by mouth 2 (two) times daily.     . famotidine (PEPCID) 10 MG tablet Take 10 mg by mouth 2 (two) times  daily.    . fluconazole (DIFLUCAN) 200 MG tablet Take one dose by mouth, wait 72 hours, and then take second dose by mouth 2 tablet 0  . ibuprofen (ADVIL) 800 MG tablet Take 1 tablet (800 mg total) by mouth every 12 (twelve) hours as needed. 10 tablet 0  . losartan (COZAAR) 50 MG tablet Take 50 mg by mouth daily.    . meloxicam (MOBIC) 15 MG tablet TAKE ONE TABLET BY MOUTHMONCE DAILY FOR 2 WEEKS; THEN AS NEEDED.    . methocarbamol (ROBAXIN) 500 MG tablet Take 500 mg by mouth at bedtime.    . Multiple Vitamin (MULTIVITAMIN) tablet Take 1 tablet by mouth daily.    . pantoprazole (PROTONIX) 40 MG tablet Take 40 mg by mouth every morning.    . Polysaccharide Iron Complex (IRON UP) 15 MG/0.5ML LIQD Take 15 mLs by mouth daily.    . Saccharomyces boulardii (FLORASTOR PO) Take 1 tablet by mouth in the morning and at bedtime.    Marland Kitchen SYNTHROID 112 MCG tablet Take 112 mcg by mouth every morning.    . vitamin B-12 (CYANOCOBALAMIN) 500 MCG tablet Take 500 mcg by mouth daily.     No current facility-administered medications for this visit.   Allergies:  Penicillins, Amoxicillin-pot clavulanate, Atenolol, and  Sulfonamide derivatives   ROS:   No chest pain or syncope.  Physical Exam: VS:  BP 130/70   Pulse 70   Ht 5\' 2"  (1.575 m)   Wt 181 lb 9.6 oz (82.4 kg)   SpO2 97%   BMI 33.22 kg/m , BMI Body mass index is 33.22 kg/m.  Wt Readings from Last 3 Encounters:  09/25/19 181 lb 9.6 oz (82.4 kg)  08/22/19 178 lb 9.2 oz (81 kg)  08/04/19 180 lb (81.6 kg)    General: Patient appears comfortable at rest. HEENT: Conjunctiva and lids normal, wearing a mask. Neck: Supple, no elevated JVP or carotid bruits, no thyromegaly. Lungs: Clear to auscultation, nonlabored breathing at rest. Cardiac: Regular rate and rhythm, no S3 or significant systolic murmur, no pericardial rub. Extremities: No pitting edema, distal pulses 2+.  ECG:  An ECG dated 03/22/2018 was personally reviewed today and demonstrated:  Sinus  rhythm with incomplete right bundle branch block.  Recent Labwork:  No interval lab work for review today.  Other Studies Reviewed Today:  Echocardiogram 07/02/2019: 1. Left ventricular ejection fraction, by estimation, is 65 to 70%. The  left ventricle has normal function. The left ventricle has no regional  wall motion abnormalities. There is mild left ventricular hypertrophy.  Left ventricular diastolic parameters  are indeterminate.  2. Right ventricular systolic function is normal. The right ventricular  size is normal. Tricuspid regurgitation signal is inadequate for assessing  PA pressure.  3. Left atrial size was mildly dilated.  4. The mitral valve is grossly normal. Trivial mitral valve  regurgitation.  5. The aortic valve is tricuspid. Aortic valve regurgitation is not  visualized.  6. The inferior vena cava is normal in size with greater than 50%  respiratory variability, suggesting right atrial pressure of 3 mmHg.   Cardiac monitor June 2021: ZIO XT reviewed.  7 days 2 hours analyzed.  Predominant rhythm is sinus with heart rate ranging from 54 bpm up to 113 bpm and average heart rate 77 bpm.  There were rare PACs and PVCs representing less than 1% total beats.  Episodes described as atrial fibrillation/flutter look to be more likely PSVT, fairly regular SVT with at times retrograde P waves.  Longest episode lasted for nearly 35 minutes.  Assessment and Plan:  1.  PSVT, cardiac monitor results from June are outlined above.  She is doing well now on higher dose Cardizem CD 240 mg daily.  Continue with observation.  2.  Essential hypertension, no changes in current regimen which also includes Cozaar.  Medication Adjustments/Labs and Tests Ordered: Current medicines are reviewed at length with the patient today.  Concerns regarding medicines are outlined above.   Tests Ordered: Orders Placed This Encounter  Procedures  . EKG 12-Lead    Medication Changes: No  orders of the defined types were placed in this encounter.   Disposition:  Follow up 6 months in the [place] office.  Signed, Satira Sark, MD, [facility] 09/25/2019 3:41 PM    [facility] [place], [place], [place] [address] Phone: [phone]; Fax: [phone]

## 2019-10-11 DIAGNOSIS — D509 Iron deficiency anemia, unspecified: Secondary | ICD-10-CM | POA: Diagnosis not present

## 2019-10-11 DIAGNOSIS — R413 Other amnesia: Secondary | ICD-10-CM | POA: Diagnosis not present

## 2019-10-14 DIAGNOSIS — R351 Nocturia: Secondary | ICD-10-CM | POA: Diagnosis not present

## 2019-10-14 DIAGNOSIS — R399 Unspecified symptoms and signs involving the genitourinary system: Secondary | ICD-10-CM | POA: Diagnosis not present

## 2019-10-14 DIAGNOSIS — R0600 Dyspnea, unspecified: Secondary | ICD-10-CM | POA: Diagnosis not present

## 2019-10-14 DIAGNOSIS — R7303 Prediabetes: Secondary | ICD-10-CM | POA: Diagnosis not present

## 2019-10-15 ENCOUNTER — Telehealth: Payer: Self-pay | Admitting: *Deleted

## 2019-10-15 DIAGNOSIS — G479 Sleep disorder, unspecified: Secondary | ICD-10-CM

## 2019-10-15 NOTE — Telephone Encounter (Signed)
We can certainly refer her to one of our sleep medicine specialist in [place] and they can set up the appropriate testing.

## 2019-10-15 NOTE — Telephone Encounter (Signed)
We can certainly refer her to one of our sleep medicine specialist in [place] and they can set up the appropriate testing

## 2019-10-19 DIAGNOSIS — E890 Postprocedural hypothyroidism: Secondary | ICD-10-CM | POA: Diagnosis not present

## 2019-10-19 DIAGNOSIS — I10 Essential (primary) hypertension: Secondary | ICD-10-CM | POA: Diagnosis not present

## 2019-10-19 DIAGNOSIS — R413 Other amnesia: Secondary | ICD-10-CM | POA: Diagnosis not present

## 2019-10-19 DIAGNOSIS — Z23 Encounter for immunization: Secondary | ICD-10-CM | POA: Diagnosis not present

## 2019-10-19 DIAGNOSIS — R270 Ataxia, unspecified: Secondary | ICD-10-CM | POA: Diagnosis not present

## 2019-10-19 DIAGNOSIS — R42 Dizziness and giddiness: Secondary | ICD-10-CM | POA: Diagnosis not present

## 2019-10-19 DIAGNOSIS — F39 Unspecified mood [affective] disorder: Secondary | ICD-10-CM | POA: Diagnosis not present

## 2019-10-22 ENCOUNTER — Telehealth: Payer: Self-pay | Admitting: Cardiology

## 2019-10-22 DIAGNOSIS — R5383 Other fatigue: Secondary | ICD-10-CM

## 2019-10-22 DIAGNOSIS — G479 Sleep disorder, unspecified: Secondary | ICD-10-CM

## 2019-10-22 NOTE — Progress Notes (Signed)
Additional information received about patient's symptoms following office encounter in August per interval communication with the patient's daughter.  [name] is described as having had disordered sleep with associated fatigue, also frequent urination at night.  Per subsequent evaluation with urgent care provider, a sleep study was recommended for further investigation.  We are seeking to get this accomplished.

## 2019-10-22 NOTE — Telephone Encounter (Signed)
Referral for Dr. Radford Pax for sleeping difficulty. She does not have a CPAP machine and had a sleep study years ago.

## 2019-10-23 ENCOUNTER — Other Ambulatory Visit: Payer: Self-pay | Admitting: Family Medicine

## 2019-10-23 ENCOUNTER — Telehealth: Payer: Self-pay | Admitting: Cardiology

## 2019-10-23 NOTE — Telephone Encounter (Signed)
We have not specifically discussed her symptoms in person, I arranged for a sleep study based on information provided me regarding an urgent care provider evaluation when she was visiting her daughter in [place] and mentioned symptoms of fatigue and poor sleep pattern.  It would certainly be reasonable to pursue those symptoms with a sleep study if she is willing, but I would have to defer to the patient and her daughter regarding the financial implications mentioned and whether or not they would like to defer it for now.

## 2019-10-23 NOTE — Telephone Encounter (Signed)
I will forward to Dr.McDowell for dispo. 

## 2019-10-23 NOTE — Telephone Encounter (Signed)
Referral sent to sleep pool for prior authorization.

## 2019-10-23 NOTE — Telephone Encounter (Signed)
New message    Patient wants to know if Dr Domenic Polite feels that she really needs to have a sleep study? She has been thinking about it and she has paid out a lot of money for medical costs and if they doesn't think she really needs this, then she will cancel the sleep study

## 2019-10-24 ENCOUNTER — Other Ambulatory Visit: Payer: Self-pay | Admitting: Family Medicine

## 2019-10-24 ENCOUNTER — Encounter (HOSPITAL_COMMUNITY): Payer: Self-pay | Admitting: Physical Therapy

## 2019-10-24 ENCOUNTER — Ambulatory Visit (HOSPITAL_COMMUNITY): Payer: PPO | Attending: Otolaryngology | Admitting: Physical Therapy

## 2019-10-24 ENCOUNTER — Other Ambulatory Visit: Payer: Self-pay

## 2019-10-24 DIAGNOSIS — R42 Dizziness and giddiness: Secondary | ICD-10-CM

## 2019-10-24 DIAGNOSIS — R262 Difficulty in walking, not elsewhere classified: Secondary | ICD-10-CM | POA: Insufficient documentation

## 2019-10-24 DIAGNOSIS — Z9181 History of falling: Secondary | ICD-10-CM | POA: Diagnosis not present

## 2019-10-24 DIAGNOSIS — R413 Other amnesia: Secondary | ICD-10-CM

## 2019-10-24 DIAGNOSIS — R270 Ataxia, unspecified: Secondary | ICD-10-CM

## 2019-10-24 NOTE — Telephone Encounter (Signed)
Spoke with pt who states that she would like to wait on having the sleep study at this time. Pt states that she feels some of her fatigue may be coming from her depression. She would like to wait and see if she starts to feel better.

## 2019-10-24 NOTE — Therapy (Signed)
[place], [place], [address] Phone: [phone]   Fax:  [phone]  Physical Therapy Evaluation  Patient Details  Name: [name] MRN: [id number] Date of Birth: 05-07-1945 Referring Provider (PT): su teoh   Encounter Date: 10/24/2019   PT End of Session - 10/24/19 1439    Visit Number 1    Number of Visits 12    Date for PT Re-Evaluation 12/05/19    Authorization Type [employer]alt[employer] no VL, no auth    Progress Note Due on Visit 10    PT Start Time 1440    PT Stop Time 1515    PT Time Calculation (min) 35 min    Activity Tolerance Patient limited by lethargy    Behavior During Therapy [facility] for tasks assessed/performed           Past Medical History:  Diagnosis Date  . Allergic rhinitis   . B12 deficiency   . Bipolar disorder ([place])   . Essential hypertension   . GERD (gastroesophageal reflux disease)   . Hashimoto's thyroiditis   . History of transient ischemic attack (TIA)    Or possibly migraine as well as right amaurosis fugax and ataxia - Dr. Erling Cruz  . Hyperlipidemia   . PSVT (paroxysmal supraventricular tachycardia) ([place])     Past Surgical History:  Procedure Laterality Date  . LAPAROSCOPIC CHOLECYSTECTOMY  2010  . THYROIDECTOMY  2009    There were no vitals filed for this visit.    Subjective Assessment - 10/24/19 1451    Subjective States that she has been short of breath as of lately. States that she has dizziness to the point of where she almost feels like she is going to fall to the floor. States that she saw the MD and they examined her for 3 hours and did all sorts of testing. States that they felt like she needed PT. States she doesn't know when she will have her dizziness and it comes out of no where and it has been going on for some time (about 1-2 years). States that it has progressively gotten more noticeable as time as progressed. States she feels off balanced and has  fallen a lot. States she has a cane but she forgot it at home today as she was in a hurry. States she forgets her cane a lot. States sometimes the cane gets in the way. States the last time she fell was a few months ago. States her left knee is also painful and she needs to get shots in and it has stopped her activity. States she used to walk but is unable to walk for pleasure anymore because of her pain in her knee. Reports mild  dizziness with transitional movements but specific movement. States that she no longer pays attention to the dizziness as she has had it for so long. States there is no real thing she can think of that causes her dizziness. States her last fall she fell out of her car and she blacked out. States her right leg "walked itself out to the side." Dizziness occurs when she is standing up. States she also has history of right ear pain. States she has an MRI scheduled to rule out an aneurysm secondary to family history and pain in he eye. States she also has really bad memory and mixes things up.    Pertinent History blacks out, hx of falls    Patient Stated Goals states she would like  to walk up and down an aisle and be straight and not be afraid she is going to fall    Currently in Pain? Yes    Pain Score 1     Pain Location Knee    Pain Orientation Left    Pain Descriptors / Indicators Aching              [facility] PT Assessment - 10/24/19 0001      Assessment   Medical Diagnosis dizziness    Referring Provider (PT) su teoh      Balance Screen   Has the patient fallen in the past 6 months Yes    How many times? 4    Has the patient had a decrease in activity level because of a fear of falling?  Yes    Is the patient reluctant to leave their home because of a fear of falling?  No      Home Environment   Living Environment Private residence    Available Help at Discharge Family    Type of [place] Access Level entry    [place] - single point;Shower seat      Prior Function   Level of Independence Independent with household mobility with device      Cognition   Overall Cognitive Status Within Functional Limits for tasks assessed      Observation/Other Assessments   Focus on Therapeutic Outcomes (FOTO)  50% limited      ROM / Strength   AROM / PROM / Strength AROM      AROM   AROM Assessment Site Cervical    Cervical Flexion 30   dizziness - light is moving side to side   Cervical Extension 30   dizziness (floor is moving back and forth)   Cervical - Right Side Bend 20   things moving side to side   Cervical - Left Side Bend 25   no symptoms.    Cervical - Right Rotation 70   dizziness   Cervical - Left Rotation 60   dizziness     Ambulation/Gait   Ambulation/Gait Yes    Assistive device None    Gait Pattern --   crosses midline, staggers backwards, deviates from projected           Cardiovascular - BP 160/87 at rest      Vestibular Assessment - 10/24/19 0001      Oculomotor Exam   Head shaking Horizontal L beating nystagmus    Head Shaking Vertical --   with both directions   Smooth Pursuits Intact    Saccades Intact              Objective measurements completed on examination: See above findings.        Vestibular Treatment/Exercise - 10/24/19 0001      Vestibular Treatment/Exercise   Gaze Exercises X1 Viewing Horizontal      X1 Viewing Horizontal   Reps 5   fuzzy after 5 reps                PT Education - 10/24/19 1523    Education Details on importance of continued use of cane and not forgetting it. on plan moving forward. on how dizziness and balance could benefit from PT/.    Person(s) Educated Patient    Methods Explanation    Comprehension Verbalized understanding  PT Short Term Goals - 10/24/19 1523      PT SHORT TERM GOAL #1   Title Patient will report at least 25% improvement in overall symptoms and/or function to demonstrate  improved functional mobility    Time 3    Period Weeks    Status New    Target Date 11/14/19      PT SHORT TERM GOAL #2   Title Patient will be able to stand on either leg for atleast 8 seconds to demonstrate improved static balance    Time 3    Period Weeks    Status New    Target Date 11/14/19      PT SHORT TERM GOAL #3   Title Patient will be independent in self management strategies to improve quality of life and functional outcomes.    Time 3    Period Weeks    Status New    Target Date 11/14/19             PT Long Term Goals - 10/24/19 1524      PT LONG TERM GOAL #1   Title Patient will report at least 50% improvement in overall symptoms and/or function to demonstrate improved functional mobility    Time 6    Period Weeks    Status New    Target Date 12/05/19      PT LONG TERM GOAL #2   Title Patient will be able to improve in DGI score by at least 4 points to demonstrate improved dynamic balance    Baseline will assess next session    Time 6    Period Weeks    Status New    Target Date 12/05/19      PT LONG TERM GOAL #3   Title Patient will improve on FOTO score to meet predicted outcomes to demonstrate improved functional mobility.    Time 6    Period Weeks    Status New    Target Date 12/05/19                  Plan - 10/24/19 1525    Clinical Impression Statement Patient presents to therapy with complaints of dizziness and balance issues that occur randomly. Of interest patient also reports that her feet "walk themselves" and she does not have control of them at times and at these times she typically falls. This occurred while patient was being escorted to the front office at the end of session when patient started walking backward without being aware of it. Patient presents with central symptoms possibly causing dizziness. Will assess dynamic balance as this is patient's primary complaint. Patient would greatly benefit from skilled physical  therapy to address functional limitations and improve patient's overall safety at home and in the community.    Personal Factors and Comorbidities Comorbidity 1;Comorbidity 2;Comorbidity 3+    Comorbidities short of breath, right ear pain, left cataracts, hx of falls    Examination-Activity Limitations Bathing;Bed Mobility;Carry;Locomotion Level;Lift;Transfers;Stand;Stairs;Squat    Examination-Participation Restrictions Community Activity;Shop    Stability/Clinical Decision Making Evolving/Moderate complexity    Clinical Decision Making Moderate    Rehab Potential Fair    PT Frequency 2x / week    PT Duration 6 weeks    PT Treatment/Interventions ADLs/Self Care Home Management;Balance training;Therapeutic exercise;Therapeutic activities;Functional mobility training;Stair training;Gait training;DME Instruction;Neuromuscular re-education;Manual techniques    PT Next Visit Plan perform DGI, gaze stabilization, habituation exercises, balance exercises.    PT Home Exercise Plan x1 gaze stabilization  Consulted and Agree with Plan of Care Patient           Patient will benefit from skilled therapeutic intervention in order to improve the following deficits and impairments:  Abnormal gait, Decreased endurance, Decreased activity tolerance, Decreased balance, Difficulty walking, Decreased range of motion, Decreased safety awareness, Decreased knowledge of use of DME, Pain  Visit Diagnosis: Dizziness and giddiness  Difficulty in walking, not elsewhere classified  History of falling     Problem List Patient Active Problem List   Diagnosis Date Noted  . Dizziness, nonspecific 07/23/2014  . Postsurgical hypothyroidism 12/08/2012  . PSVT (paroxysmal supraventricular tachycardia) ([place]) 03/12/2011  . HYPERLIPIDEMIA 11/06/2009  . ESSENTIAL HYPERTENSION, BENIGN 11/06/2009  . PALPITATIONS 11/06/2009   3:31 PM, 10/24/19 Jerene Pitch, DPT Physical Therapy with [facility]  [phone] office  [place] [address] [place], [place], [address] Phone: [phone]   Fax:  [phone]  Name: [name] MRN: [id number] Date of Birth: [birth year]

## 2019-10-26 ENCOUNTER — Telehealth: Payer: Self-pay | Admitting: *Deleted

## 2019-10-26 ENCOUNTER — Ambulatory Visit (HOSPITAL_COMMUNITY): Payer: PPO

## 2019-10-26 ENCOUNTER — Telehealth (HOSPITAL_COMMUNITY): Payer: Self-pay

## 2019-10-26 NOTE — Telephone Encounter (Signed)
[name], Carrie [name], [name] with pt who states that she would like to wait on having the sleep study at this time. Pt states that she feels some of her fatigue may be coming from her depression. She would like to wait and see if she starts to feel better.        Satira Sark, MD We have not specifically discussed her symptoms in person, I arranged for a sleep study based on information provided me regarding an urgent care provider evaluation when she was visiting her daughter in [place] and mentioned symptoms of fatigue and poor sleep pattern.  It would certainly be reasonable to pursue those symptoms with a sleep study if she is willing, but I would have to defer to the patient and her daughter regarding the financial implications mentioned and whether or not they would like to defer it for now.

## 2019-10-26 NOTE — Telephone Encounter (Signed)
pt called to cx today's appt due to she is feeling dizzy

## 2019-10-26 NOTE — Telephone Encounter (Signed)
-----   Message from Lauralee Evener, [place] sent at 10/25/2019  5:20 PM EDT ----- Regarding: RE: precert I don't see a office note documenting symptoms and order of sleep study. May need to addend last OV note. ----- Message ----- From: Freada Bergeron, CMA Sent: 10/23/2019  11:32 AM EDT To: Cv Div Sleep Studies Subject: precert                                        Split night

## 2019-10-29 ENCOUNTER — Encounter (HOSPITAL_COMMUNITY): Payer: Self-pay | Admitting: Physical Therapy

## 2019-10-29 ENCOUNTER — Other Ambulatory Visit: Payer: Self-pay

## 2019-10-29 ENCOUNTER — Telehealth: Payer: Self-pay | Admitting: *Deleted

## 2019-10-29 ENCOUNTER — Ambulatory Visit (HOSPITAL_COMMUNITY): Payer: PPO | Attending: Otolaryngology | Admitting: Physical Therapy

## 2019-10-29 ENCOUNTER — Telehealth (HOSPITAL_COMMUNITY): Payer: Self-pay | Admitting: Physical Therapy

## 2019-10-29 DIAGNOSIS — Z9181 History of falling: Secondary | ICD-10-CM | POA: Diagnosis not present

## 2019-10-29 DIAGNOSIS — R262 Difficulty in walking, not elsewhere classified: Secondary | ICD-10-CM | POA: Diagnosis not present

## 2019-10-29 DIAGNOSIS — R42 Dizziness and giddiness: Secondary | ICD-10-CM | POA: Insufficient documentation

## 2019-10-29 NOTE — Telephone Encounter (Signed)
Spoke with daughter about concerns with patient and possibly transitioning patient to [place] for a bit to live with daughter. Patient has apts in [place] and will currently continue with physical therapy until they are able to temporarily transition to [place]. Answered all questions and concerns daughter had about mother.  7:58 AM, 10/29/19 Jerene Pitch, DPT Physical Therapy with [facility]  [phone] office

## 2019-10-29 NOTE — Telephone Encounter (Signed)
PA for in lab sleep study submitted to [facility] via web portal.

## 2019-10-29 NOTE — Patient Instructions (Signed)
Access Code: [email or web address] URL: [email or web address] Date: 10/29/2019 Prepared by: Josue Hector  Exercises Seated Gaze Stabilization with Head Rotation - 3 x daily - 7 x weekly - 2 sets - 10 reps Seated Horizontal Saccades - 3 x daily - 7 x weekly - 2 sets - 10 reps

## 2019-10-29 NOTE — Therapy (Signed)
[place], [place], [address] Phone: [phone]   Fax:  [phone]  Physical Therapy Treatment  Patient Details  Name: [name] MRN: [id number] Date of Birth: 01-Jul-1945 Referring Provider (PT): su teoh   Encounter Date: 10/29/2019   PT End of Session - 10/29/19 1448    Visit Number 2    Number of Visits 12    Date for PT Re-Evaluation 12/05/19    Authorization Type healthte[employer] advantage no VL, no auth    Progress Note Due on Visit 10    PT Start Time 1440    PT Stop Time 1545    PT Time Calculation (min) 65 min    Equipment Utilized During Treatment Gait belt    Activity Tolerance Patient tolerated treatment well    Behavior During Therapy [facility] for tasks assessed/performed           Past Medical History:  Diagnosis Date  . Allergic rhinitis   . B12 deficiency   . Bipolar disorder ([place])   . Essential hypertension   . GERD (gastroesophageal reflux disease)   . Hashimoto's thyroiditis   . History of transient ischemic attack (TIA)    Or possibly migraine as well as right amaurosis fugax and ataxia - Dr. Erling Cruz  . Hyperlipidemia   . PSVT (paroxysmal supraventricular tachycardia) ([place])     Past Surgical History:  Procedure Laterality Date  . LAPAROSCOPIC CHOLECYSTECTOMY  2010  . THYROIDECTOMY  2009    There were no vitals filed for this visit.   Subjective Assessment - 10/29/19 1447    Subjective Patient says she still feels wobbly. Says this is "usual" and she has felt like this for a long time. Patient also notes that her feet walk by themselves and about 2-3 months ago she started walking backwards which freaked her out. Hasn't happened since.    Pertinent History blacks out, hx of falls    Patient Stated Goals states she would like to walk up and down an aisle and be straight and not be afraid she is going to fall    Currently in Pain? No/denies              [facility] PT Assessment -  10/29/19 0001      Standardized Balance Assessment   Standardized Balance Assessment Dynamic Gait Index      Dynamic Gait Index   Level Surface Mild Impairment    Change in Gait Speed Mild Impairment    Gait with Horizontal Head Turns Moderate Impairment    Gait with Vertical Head Turns Moderate Impairment    Gait and Pivot Turn Moderate Impairment    Step Over Obstacle Severe Impairment    Step Around Obstacles Severe Impairment    Steps Moderate Impairment    Total Score 8               Vestibular Assessment - 10/29/19 0001      Positional Testing   Dix-Hallpike Dix-Hallpike Right;Dix-Hallpike Left      Dix-Hallpike Right   Dix-Hallpike Right Duration --   Negative test but increased dizziness upon return to sitting     Dix-Hallpike Left   Dix-Hallpike Left Duration --   Negative test but increased dizziness upon return to sitting                    Vestibular Treatment/Exercise - 10/29/19 0001      Vestibular Treatment/Exercise   Vestibular Treatment  Provided Canalith Repositioning    Canalith Repositioning Epley Manuever Right;Epley Manuever Left    Gaze Exercises --   VOR 1 x 20, saccades x20       EPLEY MANUEVER RIGHT   Number of Reps  1    Overall Response --   Negative test but increased dizziness upon return to sitting      EPLEY MANUEVER LEFT   Number of Reps  1    Overall Response  --   Negative test but increased dizziness upon return to sitting                  PT Short Term Goals - 10/24/19 1523      PT SHORT TERM GOAL #1   Title Patient will report at least 25% improvement in overall symptoms and/or function to demonstrate improved functional mobility    Time 3    Period Weeks    Status New    Target Date 11/14/19      PT SHORT TERM GOAL #2   Title Patient will be able to stand on either leg for atleast 8 seconds to demonstrate improved static balance    Time 3    Period Weeks    Status New    Target Date 11/14/19        PT SHORT TERM GOAL #3   Title Patient will be independent in self management strategies to improve quality of life and functional outcomes.    Time 3    Period Weeks    Status New    Target Date 11/14/19             PT Long Term Goals - 10/24/19 1524      PT LONG TERM GOAL #1   Title Patient will report at least 50% improvement in overall symptoms and/or function to demonstrate improved functional mobility    Time 6    Period Weeks    Status New    Target Date 12/05/19      PT LONG TERM GOAL #2   Title Patient will be able to improve in DGI score by at least 4 points to demonstrate improved dynamic balance    Baseline will assess next session    Time 6    Period Weeks    Status New    Target Date 12/05/19      PT LONG TERM GOAL #3   Title Patient will improve on FOTO score to meet predicted outcomes to demonstrate improved functional mobility.    Time 6    Period Weeks    Status New    Target Date 12/05/19                 Plan - 10/29/19 1613    Clinical Impression Statement Reviewed therapy goals and HEP exercises. Patient demos some confusion about HEP exercise and states that she tends to forget things. Patient says she has not done these exercises because she forgot. Reviewed exercise and cued for proper form and sequencing. Conducted further assessment for balance today. Performed DGI, and also monitored blood pressure during activity throughout session today. Patient appears to become most dizzy with activity such as sit to stand or climbing stairs. Patient did demo significant flux in blood pressure tested with digital cuff throughout today's session. Patient says she does not know what is normal for her, but appears to be around 158/79 at rest. Blood pressure elevated to 176/78 after stair ambulation, upon which patient became dizzy  and very unsteady on her feet. Patient was assisted back to seated in chair by therapist. Blood pressure reassessed in seated  and had returned to baseline. Conducted Eppley maneuver to RT and LT. Both tests negative, but patient notes increased dizziness with return to sitting from sidelying position. Educated patient on today's findings and reiterated HEP. Patient says she is not sure that she was given these exercises last visit, so another copy was made and given to patient for home.    Personal Factors and Comorbidities Comorbidity 1;Comorbidity 2;Comorbidity 3+    Comorbidities short of breath, right ear pain, left cataracts, hx of falls    Examination-Activity Limitations Bathing;Bed Mobility;Carry;Locomotion Level;Lift;Transfers;Stand;Stairs;Squat    Examination-Participation Restrictions Community Activity;Shop    Stability/Clinical Decision Making Evolving/Moderate complexity    Rehab Potential Fair    PT Frequency 2x / week    PT Duration 6 weeks    PT Treatment/Interventions ADLs/Self Care Home Management;Balance training;Therapeutic exercise;Therapeutic activities;Functional mobility training;Stair training;Gait training;DME Instruction;Neuromuscular re-education;Manual techniques    PT Next Visit Plan Continue with habituation exercises, balance exercises. Begin with static and progress to compliant surfaces> dynamic    PT Home Exercise Plan x1 gaze stabilization (VOR 1, saccades)    Consulted and Agree with Plan of Care Patient           Patient will benefit from skilled therapeutic intervention in order to improve the following deficits and impairments:  Abnormal gait, Decreased endurance, Decreased activity tolerance, Decreased balance, Difficulty walking, Decreased range of motion, Decreased safety awareness, Decreased knowledge of use of DME, Pain  Visit Diagnosis: Dizziness and giddiness  Difficulty in walking, not elsewhere classified  History of falling     Problem List Patient Active Problem List   Diagnosis Date Noted  . Dizziness, nonspecific 07/23/2014  . Postsurgical  hypothyroidism 12/08/2012  . PSVT (paroxysmal supraventricular tachycardia) ([place]) 03/12/2011  . HYPERLIPIDEMIA 11/06/2009  . ESSENTIAL HYPERTENSION, BENIGN 11/06/2009  . PALPITATIONS 11/06/2009    4:23 PM, 10/29/19 Josue Hector PT DPT  Physical Therapist with [facility]  [phone] [place] [address] [place], [place], [address] Phone: [phone]   Fax:  [phone]  Name: [name] MRN: [id number] Date of Birth: February 13, 1945

## 2019-10-31 ENCOUNTER — Other Ambulatory Visit: Payer: Self-pay

## 2019-10-31 ENCOUNTER — Encounter (HOSPITAL_COMMUNITY): Payer: Self-pay

## 2019-10-31 ENCOUNTER — Ambulatory Visit (HOSPITAL_COMMUNITY): Payer: PPO

## 2019-10-31 VITALS — BP 142/74 | HR 89

## 2019-10-31 DIAGNOSIS — R42 Dizziness and giddiness: Secondary | ICD-10-CM

## 2019-10-31 DIAGNOSIS — Z9181 History of falling: Secondary | ICD-10-CM

## 2019-10-31 DIAGNOSIS — R262 Difficulty in walking, not elsewhere classified: Secondary | ICD-10-CM

## 2019-10-31 NOTE — Therapy (Signed)
[place], [place], [address] Phone: [phone]   Fax:  [phone]  Physical Therapy Treatment  Patient Details  Name: [name] MRN: [id number] Date of Birth: [birth year] Referring Provider (PT): su teoh   Encounter Date: 10/31/2019   PT End of Session - 10/31/19 1549    Visit Number 3    Number of Visits 12    Date for PT Re-Evaluation 12/05/19    Authorization Type [employer] no VL, no auth    Progress Note Due on Visit 10    PT Start Time 1500    PT Stop Time 1532    PT Time Calculation (min) 32 min    Equipment Utilized During Treatment Gait belt    Activity Tolerance Patient tolerated treatment well    Behavior During Therapy [facility] for tasks assessed/performed           Past Medical History:  Diagnosis Date  . Allergic rhinitis   . B12 deficiency   . Bipolar disorder ([place])   . Essential hypertension   . GERD (gastroesophageal reflux disease)   . Hashimoto's thyroiditis   . History of transient ischemic attack (TIA)    Or possibly migraine as well as right amaurosis fugax and ataxia - Dr. Erling Cruz  . Hyperlipidemia   . PSVT (paroxysmal supraventricular tachycardia) ([place])     Past Surgical History:  Procedure Laterality Date  . LAPAROSCOPIC CHOLECYSTECTOMY  2010  . THYROIDECTOMY  2009    Vitals:   10/31/19 1504 10/31/19 1520  BP: (!) 158/72 (!) 142/74  Pulse: 91 89     Subjective Assessment - 10/31/19 1513    Subjective Pt late for apt.  Stated she had difficulty upon initial standing and leaning backwards this morning.  No reports of dizziness today.  Has been busy packing as has been moving out of house to remodal, admits not completeing exercises at home.    Pertinent History blacks out, hx of falls    Patient Stated Goals states she would like to walk up and down an aisle and be straight and not be afraid she is going to fall    Currently in Pain? No/denies                               Vestibular Treatment/Exercise - 10/31/19 0001      Vestibular Treatment/Exercise   Vestibular Treatment Provided Habituation;Gaze    Gaze Exercises X1 Viewing Horizontal;X1 Viewing Vertical;Eye/Head Exercise Horizontal      X1 Viewing Horizontal   Reps 10    Comments NBOS tendency to posterior lean, no nystagmus      X1 Viewing Vertical   Foot Position NBOS    Reps 10    Comments NBOS tendency to posterior lean, no nystagmus              Balance Exercises - 10/31/19 0001      Balance Exercises: Standing   Wall Bumps Shoulder;Hip;Eyes opened;5 reps    Sidestepping 1 rep;Other (comment)   cueing to reduce ER   Sit to Stand Standard surface;Other (comment)   3 sets; 5 reps timed (best 16.57") ; 3rd set posterior LOB              PT Short Term Goals - 10/24/19 1523      PT SHORT TERM GOAL #1   Title Patient will report at least 25% improvement in  overall symptoms and/or function to demonstrate improved functional mobility    Time 3    Period Weeks    Status New    Target Date 11/14/19      PT SHORT TERM GOAL #2   Title Patient will be able to stand on either leg for atleast 8 seconds to demonstrate improved static balance    Time 3    Period Weeks    Status New    Target Date 11/14/19      PT SHORT TERM GOAL #3   Title Patient will be independent in self management strategies to improve quality of life and functional outcomes.    Time 3    Period Weeks    Status New    Target Date 11/14/19             PT Long Term Goals - 10/24/19 1524      PT LONG TERM GOAL #1   Title Patient will report at least 50% improvement in overall symptoms and/or function to demonstrate improved functional mobility    Time 6    Period Weeks    Status New    Target Date 12/05/19      PT LONG TERM GOAL #2   Title Patient will be able to improve in DGI score by at least 4 points to demonstrate improved dynamic balance    Baseline  will assess next session    Time 6    Period Weeks    Status New    Target Date 12/05/19      PT LONG TERM GOAL #3   Title Patient will improve on FOTO score to meet predicted outcomes to demonstrate improved functional mobility.    Time 6    Period Weeks    Status New    Target Date 12/05/19                 Plan - 10/31/19 1712    Clinical Impression Statement Session focus on primarly balance and reviewed current HEP with gaze exercises.  Pt required cueing to improve eye stability with head turns as tends to loose focus.  Vitals assessed through session and documented in pt.'s notebook she brought.  Pt admits she has not completed exercises at home, reviewed importance of compliance for maximal benefits wiht verbalized understanding.  Pt presents with only one episode of dizziness lasting shorter than 5" during increased speed during sit to stand with posterior LOB infront of chair requiring mod A for safety.  Pt reports main concern currenlty with her balance and tendency to lean backwards.  Added static NBOS with head turns, shoulder and hip wall bumps and sidestep with no reports of dizziness during these activities.    Personal Factors and Comorbidities Comorbidity 1;Comorbidity 2;Comorbidity 3+    Comorbidities short of breath, right ear pain, left cataracts, hx of falls    Examination-Activity Limitations Bathing;Bed Mobility;Carry;Locomotion Level;Lift;Transfers;Stand;Stairs;Squat    Examination-Participation Restrictions Community Activity;Shop    Stability/Clinical Decision Making Evolving/Moderate complexity    Clinical Decision Making Moderate    Rehab Potential Fair    PT Frequency 2x / week    PT Duration 6 weeks    PT Treatment/Interventions ADLs/Self Care Home Management;Balance training;Therapeutic exercise;Therapeutic activities;Functional mobility training;Stair training;Gait training;DME Instruction;Neuromuscular re-education;Manual techniques    PT Next Visit  Plan Continue with habituation exercises, balance exercises. Begin with static and progress to compliant surfaces> dynamic    PT Home Exercise Plan x1 gaze stabilization (VOR 1, saccades)  Patient will benefit from skilled therapeutic intervention in order to improve the following deficits and impairments:  Abnormal gait, Decreased endurance, Decreased activity tolerance, Decreased balance, Difficulty walking, Decreased range of motion, Decreased safety awareness, Decreased knowledge of use of DME, Pain  Visit Diagnosis: Difficulty in walking, not elsewhere classified  History of falling  Dizziness and giddiness     Problem List Patient Active Problem List   Diagnosis Date Noted  . Dizziness, nonspecific 07/23/2014  . Postsurgical hypothyroidism 12/08/2012  . PSVT (paroxysmal supraventricular tachycardia) ([place]) 03/12/2011  . HYPERLIPIDEMIA 11/06/2009  . ESSENTIAL HYPERTENSION, BENIGN 11/06/2009  . PALPITATIONS 11/06/2009   Ihor Austin, LPTA/CLT; CBIS [phone]  Aldona Lento 10/31/2019, 5:23 PM  [facility] [address] [place], [place], [address] Phone: [phone]   Fax:  [phone]  Name: [name] MRN: [id number] Date of Birth: 03-May-1945

## 2019-11-05 ENCOUNTER — Emergency Department (HOSPITAL_COMMUNITY): Payer: PPO

## 2019-11-05 ENCOUNTER — Encounter (HOSPITAL_COMMUNITY): Payer: Self-pay

## 2019-11-05 ENCOUNTER — Telehealth: Payer: Self-pay | Admitting: *Deleted

## 2019-11-05 ENCOUNTER — Other Ambulatory Visit: Payer: Self-pay

## 2019-11-05 ENCOUNTER — Telehealth: Payer: Self-pay

## 2019-11-05 ENCOUNTER — Emergency Department (HOSPITAL_COMMUNITY)
Admission: EM | Admit: 2019-11-05 | Discharge: 2019-11-05 | Disposition: A | Discharge status: Home / Self Care | Payer: PPO | Attending: Emergency Medicine | Admitting: Emergency Medicine

## 2019-11-05 DIAGNOSIS — R0602 Shortness of breath: Secondary | ICD-10-CM | POA: Diagnosis not present

## 2019-11-05 DIAGNOSIS — Z79899 Other long term (current) drug therapy: Secondary | ICD-10-CM | POA: Diagnosis not present

## 2019-11-05 DIAGNOSIS — I10 Essential (primary) hypertension: Secondary | ICD-10-CM | POA: Diagnosis not present

## 2019-11-05 DIAGNOSIS — Z20822 Contact with and (suspected) exposure to covid-19: Secondary | ICD-10-CM | POA: Insufficient documentation

## 2019-11-05 DIAGNOSIS — M170 Bilateral primary osteoarthritis of knee: Secondary | ICD-10-CM | POA: Diagnosis not present

## 2019-11-05 DIAGNOSIS — R21 Rash and other nonspecific skin eruption: Secondary | ICD-10-CM | POA: Diagnosis not present

## 2019-11-05 DIAGNOSIS — Z87891 Personal history of nicotine dependence: Secondary | ICD-10-CM | POA: Diagnosis not present

## 2019-11-05 DIAGNOSIS — Z7982 Long term (current) use of aspirin: Secondary | ICD-10-CM | POA: Insufficient documentation

## 2019-11-05 LAB — CBC WITH DIFFERENTIAL/PLATELET
Abs Immature Granulocytes: 0.03 10*3/uL (ref 0.00–0.07)
Basophils Absolute: 0 10*3/uL (ref 0.0–0.1)
Basophils Relative: 0 %
Eosinophils Absolute: 0.1 10*3/uL (ref 0.0–0.5)
Eosinophils Relative: 1 %
HCT: 37.5 % (ref 36.0–46.0)
Hemoglobin: 12.9 g/dL (ref 12.0–15.0)
Immature Granulocytes: 0 %
Lymphocytes Relative: 15 %
Lymphs Abs: 1.2 10*3/uL (ref 0.7–4.0)
MCH: 31.9 pg (ref 26.0–34.0)
MCHC: 34.4 g/dL (ref 30.0–36.0)
MCV: 92.6 fL (ref 80.0–100.0)
Monocytes Absolute: 0.5 10*3/uL (ref 0.1–1.0)
Monocytes Relative: 6 %
Neutro Abs: 6.4 10*3/uL (ref 1.7–7.7)
Neutrophils Relative %: 78 %
Platelets: 433 10*3/uL — ABNORMAL HIGH (ref 150–400)
RBC: 4.05 MIL/uL (ref 3.87–5.11)
RDW: 13.9 % (ref 11.5–15.5)
WBC: 8.3 10*3/uL (ref 4.0–10.5)
nRBC: 0 % (ref 0.0–0.2)

## 2019-11-05 LAB — COMPREHENSIVE METABOLIC PANEL
ALT: 14 U/L (ref 0–44)
AST: 16 U/L (ref 15–41)
Albumin: 4.1 g/dL (ref 3.5–5.0)
Alkaline Phosphatase: 77 U/L (ref 38–126)
Anion gap: 10 (ref 5–15)
BUN: 7 mg/dL — ABNORMAL LOW (ref 8–23)
CO2: 24 mmol/L (ref 22–32)
Calcium: 9 mg/dL (ref 8.9–10.3)
Chloride: 99 mmol/L (ref 98–111)
Creatinine, Ser: 0.61 mg/dL (ref 0.44–1.00)
GFR, Estimated: >60 mL/min (ref >60)
Glucose, Bld: 101 mg/dL — ABNORMAL HIGH (ref 70–99)
Potassium: 3.8 mmol/L (ref 3.5–5.1)
Sodium: 133 mmol/L — ABNORMAL LOW (ref 135–145)
Total Bilirubin: 0.7 mg/dL (ref 0.3–1.2)
Total Protein: 7.1 g/dL (ref 6.5–8.1)

## 2019-11-05 LAB — TROPONIN I (HIGH SENSITIVITY): Troponin I (High Sensitivity): 6 ng/L (ref <18)

## 2019-11-05 LAB — D-DIMER, QUANTITATIVE: D-Dimer, Quant: 0.31 ug/mL-FEU (ref 0.00–0.50)

## 2019-11-05 LAB — RESPIRATORY PANEL BY RT PCR (FLU A&B, COVID)
Influenza A by PCR: NEGATIVE
Influenza B by PCR: NEGATIVE
SARS Coronavirus 2 by RT PCR: NEGATIVE

## 2019-11-05 LAB — TSH: TSH: 7.117 u[IU]/mL — ABNORMAL HIGH (ref 0.350–4.500)

## 2019-11-05 LAB — BRAIN NATRIURETIC PEPTIDE: B Natriuretic Peptide: 51.1 pg/mL (ref 0.0–100.0)

## 2019-11-05 NOTE — Telephone Encounter (Signed)
Sleep study PA request status checked via HTA portal still pending. Pending Auth # [id number].

## 2019-11-05 NOTE — Telephone Encounter (Signed)
Update: Patient had knees injected at [facility] today.  Caryl Pina, [place], called to say they were sending patient to either [facility] or  [facility]  ED as she had become acutely SOB.

## 2019-11-05 NOTE — ED Triage Notes (Signed)
Pt c/o SOB. Pt stated she started feeling SOB this morning. Pt O2 reading 100% RA. Pt stated she has been taking her Cardizem as prescribed.

## 2019-11-05 NOTE — Telephone Encounter (Signed)
Daughter offered an apt this Wednesday and declined.She told front office staff [place].    I will FYI: Dr.McDowell

## 2019-11-05 NOTE — ED Provider Notes (Signed)
[facility] DEPT Provider Note   CSN: [id number] Arrival date & time: 11/05/19  1538     History Chief Complaint  Patient presents with  . Shortness of Breath    [name] is a 74 y.o. female.  HPI Patient presents with shortness of breath.  Started feeling this morning.  States she feels as if she would have trouble walking to the car.  States this is gone for the last couple months.  Worse today.  States it comes and goes at times.  States she sees Dr. Domenic Polite from cardiology.  States she has something "JV" no swelling her legs.  States she was at the orthopedic office today to get injections in her knees.  States that she felt more short of breath after but had shortness of breath prior to the injections.    Past Medical History:  Diagnosis Date  . Allergic rhinitis   . B12 deficiency   . Bipolar disorder ([place])   . Essential hypertension   . GERD (gastroesophageal reflux disease)   . Hashimoto's thyroiditis   . History of transient ischemic attack (TIA)    Or possibly migraine as well as right amaurosis fugax and ataxia - Dr. Erling Cruz  . Hyperlipidemia   . PSVT (paroxysmal supraventricular tachycardia) [facility])     Patient Active Problem List   Diagnosis Date Noted  . Dizziness, nonspecific 07/23/2014  . Postsurgical hypothyroidism 12/08/2012  . PSVT (paroxysmal supraventricular tachycardia) ([place]) 03/12/2011  . HYPERLIPIDEMIA 11/06/2009  . ESSENTIAL HYPERTENSION, BENIGN 11/06/2009  . PALPITATIONS 11/06/2009    Past Surgical History:  Procedure Laterality Date  . LAPAROSCOPIC CHOLECYSTECTOMY  2010  . THYROIDECTOMY  2009     OB History   No obstetric history on file.     Family History  Problem Relation Age of Onset  . Stroke Mother   . Thyroid disease Mother   . Depression Father   . Hypertension Sister   . Aneurysm Sister   . Diabetes Brother   . Heart attack Brother   . Hypertension Brother   . Heart failure Brother    . Thyroid disease Brother   . Stroke Maternal Grandmother   . Heart failure Maternal Grandmother   . Heart failure Maternal Grandfather   . Stroke Maternal Grandfather   . Heart failure Paternal Grandmother     Social History   Tobacco Use  . Smoking status: Former Smoker    Packs/day: 0.50    Years: 10.00    Pack years: 5.00    Types: Cigarettes    Quit date: 01/26/1983    Years since quitting: 36.8  . Smokeless tobacco: Never Used  . Tobacco comment: quit 30 years ago  Vaping Use  . Vaping Use: Never used  Substance Use Topics  . Alcohol use: No    Alcohol/week: 0.0 standard drinks  . Drug use: No    Home Medications Prior to Admission medications   Medication Sig Start Date End Date Taking? Authorizing Provider  Ascorbic Acid (VITAMIN C PO) Take 1 tablet by mouth daily.    [provider]  aspirin EC 81 MG tablet Take 81 mg by mouth daily.    [provider]  buPROPion (WELLBUTRIN XL) 300 MG 24 hr tablet Take 300 mg by mouth daily.  02/08/14   [provider]  calcium carbonate (TUMS - DOSED IN MG ELEMENTAL CALCIUM) 500 MG chewable tablet Chew 4 tablets by mouth daily.    [provider]  clonazePAM (KLONOPIN) 1 MG tablet Take 1 mg by mouth at bedtime. May take up to 3 times daily as needed for anxiety      diltiazem (CARDIZEM CD) 240 MG 24 hr capsule Take 1 capsule (240 mg total) by mouth daily. 07/16/19 10/14/19  Satira Sark, MD  DULoxetine (CYMBALTA) 60 MG capsule Take 60 mg by mouth 2 (two) times daily.  01/29/14   [provider]  famotidine (PEPCID) 10 MG tablet Take 10 mg by mouth 2 (two) times daily.    [provider]  fluconazole (DIFLUCAN) 200 MG tablet Take one dose by mouth, wait 72 hours, and then take second dose by mouth 08/22/19   Wurst, [place], PA-C  ibuprofen (ADVIL) 800 MG tablet Take 1 tablet (800 mg total) by mouth every 12 (twelve) hours as needed. 08/22/19   Wurst, [place], PA-C  losartan  (COZAAR) 50 MG tablet Take 50 mg by mouth daily.    [provider]  meloxicam (MOBIC) 15 MG tablet TAKE ONE TABLET BY MOUTHMONCE DAILY FOR 2 WEEKS; THEN AS NEEDED. 07/06/19   [provider]  methocarbamol (ROBAXIN) 500 MG tablet Take 500 mg by mouth at bedtime. 09/12/19   [provider]  Multiple Vitamin (MULTIVITAMIN) tablet Take 1 tablet by mouth daily.    [provider]  pantoprazole (PROTONIX) 40 MG tablet Take 40 mg by mouth every morning. 09/11/19   [provider]  Polysaccharide Iron Complex (IRON UP) 15 MG/0.5ML LIQD Take 15 mLs by mouth daily.    [provider]  Saccharomyces boulardii (FLORASTOR PO) Take 1 tablet by mouth in the morning and at bedtime.    [provider]  SYNTHROID 112 MCG tablet Take 112 mcg by mouth every morning. 07/19/19   [provider]  vitamin B-12 (CYANOCOBALAMIN) 500 MCG tablet Take 500 mcg by mouth daily.    [provider]    Allergies    Penicillins, Amoxicillin-pot clavulanate, Atenolol, and Sulfonamide derivatives  Review of Systems   Review of Systems  Constitutional: Negative for appetite change and fatigue.  HENT: Negative for congestion.   Respiratory: Positive for shortness of breath. Negative for cough.   Cardiovascular: Negative for chest pain and leg swelling.  Gastrointestinal: Negative for abdominal pain.  Genitourinary: Negative for flank pain.  Musculoskeletal: Negative for back pain.  Skin: Negative for rash.  Neurological: Negative for weakness.  Psychiatric/Behavioral: Negative for confusion.    Physical Exam Updated Vital Signs BP (!) 148/77   Pulse 90   Temp 97.9 F (36.6 C) (Oral)   Resp 17   SpO2 100%   Physical Exam Vitals and nursing note reviewed.  HENT:     Head: Normocephalic.  Neck:     Vascular: No JVD.  Cardiovascular:     Rate and Rhythm: Normal rate and regular rhythm.  Pulmonary:     Effort: No respiratory distress.      Breath sounds: No wheezing, rhonchi or rales.  Chest:     Chest wall: No tenderness.  Abdominal:     Tenderness: There is no abdominal tenderness.  Musculoskeletal:     Cervical back: Neck supple.     Right lower leg: No edema.     Left lower leg: No edema.  Skin:    General: Skin is warm.     Capillary Refill: Capillary refill takes less than 2 seconds.  Neurological:     Mental Status: She is alert.     ED Results / Procedures /  Treatments   Labs (all labs ordered are listed, but only abnormal results are displayed) Labs Reviewed  CBC WITH DIFFERENTIAL/PLATELET - Abnormal; Notable for the following components:      Result Value   Platelets 433 (*)    All other components within normal limits  TSH - Abnormal; Notable for the following components:   TSH 7.117 (*)    All other components within normal limits  COMPREHENSIVE METABOLIC PANEL - Abnormal; Notable for the following components:   Sodium 133 (*)    Glucose, Bld 101 (*)    BUN 7 (*)    All other components within normal limits  RESPIRATORY PANEL BY RT PCR (FLU A&B, COVID)  BRAIN NATRIURETIC PEPTIDE  D-DIMER, QUANTITATIVE (NOT AT [facility])  TROPONIN I (HIGH SENSITIVITY)  TROPONIN I (HIGH SENSITIVITY)    EKG EKG Interpretation  Date/Time:  Monday November 05 2019 16:01:25 EDT Ventricular Rate:  77 PR Interval:    QRS Duration: 123 QT Interval:  439 QTC Calculation: 497 R Axis:   22 Text Interpretation: Sinus rhythm Right bundle branch block 12 Lead; [place] Confirmed by Davonna Belling [phone]) on 11/05/2019 4:11:40 PM   Radiology DG Chest Portable 1 View  Result Date: 11/05/2019 CLINICAL DATA:  74 year old female with shortness of breath. EXAM: PORTABLE CHEST 1 VIEW COMPARISON:  Chest radiograph dated 10/19/2007. FINDINGS: The lungs are clear. There is no pleural effusion pneumothorax. The cardiac silhouette is within normal limits. Thyroidectomy surgical clips. No acute osseous pathology. IMPRESSION:  No active disease. Electronically Signed   By: Anner Crete M.D.   On: 11/05/2019 17:42    Procedures Procedures (including critical care time)  Medications Ordered in ED Medications - No data to display  ED Course  I have reviewed the triage vital signs and the nursing notes.  Pertinent labs & imaging results that were available during my care of the patient were reviewed by me and considered in my medical decision making (see chart for details).    MDM Rules/Calculators/A&P                          Patient with shortness of breath.  States has been going for a while but worse today.  Work-up overall reassuring.  Does have an elevated TSH.  May need adjustment of medications by PCP.  X-ray reassuring.  Negative D-dimer.  Negative troponin.  EKG reassuring does not show the SVT she has previously had.  Discharge home with outpatient follow-up as needed.  Also discussed with son about the results Final Clinical Impression(s) / ED Diagnoses Final diagnoses:  Shortness of breath    Rx / DC Orders ED Discharge Orders    None       Davonna Belling, MD 11/05/19 [phone]

## 2019-11-05 NOTE — Discharge Instructions (Addendum)
Your TSH is high. Follow up with your Doctor about possible adjustment of your medications.

## 2019-11-05 NOTE — ED Notes (Signed)
Pt placed on purewick at 60 mmHg. 

## 2019-11-05 NOTE — Telephone Encounter (Signed)
I think it would be best if we schedule an office visit so that we can discuss her symptoms in more detail.

## 2019-11-06 ENCOUNTER — Telehealth: Payer: Self-pay | Admitting: Cardiology

## 2019-11-06 NOTE — Telephone Encounter (Signed)
New message     Patient went to Er yesterday , daughter would like you to review the notes from her hospital visit and advise if you think the patient needs to keep her appointment on Thursday 11/08/19 or is it ok to cancel it ?

## 2019-11-06 NOTE — Telephone Encounter (Signed)
Pt informed to keep appt with Dr. Domenic Polite

## 2019-11-06 NOTE — Telephone Encounter (Signed)
I reviewed the testing from ER work-up, generally reassuring from a cardiac perspective, but still does not explain why she is having the symptoms.  I doubt that PSVT is the cause, this is the main thing that we have been following.  I would definitely recommend an office visit so that symptoms can be further discussed and other etiologies considered.

## 2019-11-07 ENCOUNTER — Encounter (HOSPITAL_COMMUNITY): Payer: PPO | Admitting: Physical Therapy

## 2019-11-07 ENCOUNTER — Ambulatory Visit: Payer: PPO | Admitting: Cardiology

## 2019-11-08 ENCOUNTER — Ambulatory Visit (INDEPENDENT_AMBULATORY_CARE_PROVIDER_SITE_OTHER): Payer: PPO | Admitting: Student

## 2019-11-08 ENCOUNTER — Other Ambulatory Visit: Payer: Self-pay

## 2019-11-08 ENCOUNTER — Encounter: Payer: Self-pay | Admitting: Student

## 2019-11-08 ENCOUNTER — Encounter: Payer: Self-pay | Admitting: *Deleted

## 2019-11-08 VITALS — BP 132/78 | HR 72 | Ht 62.0 in | Wt 178.6 lb

## 2019-11-08 DIAGNOSIS — R0600 Dyspnea, unspecified: Secondary | ICD-10-CM | POA: Diagnosis not present

## 2019-11-08 DIAGNOSIS — I10 Essential (primary) hypertension: Secondary | ICD-10-CM | POA: Diagnosis not present

## 2019-11-08 DIAGNOSIS — I471 Supraventricular tachycardia: Secondary | ICD-10-CM

## 2019-11-08 DIAGNOSIS — R0609 Other forms of dyspnea: Secondary | ICD-10-CM

## 2019-11-08 NOTE — Progress Notes (Signed)
Cardiology Office Note    Date:  11/08/2019   ID:  [name], DOB 07-Aug-1945, MRN [id number]  PCP:  Caren Macadam, MD  Cardiologist: Rozann Lesches, MD    Chief Complaint  Patient presents with  . Follow-up    recent Emergency Dept visit    History of Present Illness:    [name] is a 74 y.o. female with past medical history of HTN, SVT and GERD who presents to the office today for evaluation of worsening dyspnea.  She was last examined by Dr. Domenic Polite in 08/2019 for follow-up from her recent event monitor which demonstrated episodes of SVT and Cardizem CD had been increased to 240 mg daily. She reported overall doing well at that time and was continued on her current medication regimen.  The patient's daughter sent a MyChart message on 11/02/2019 reporting she was having worsening dyspnea and a follow-up visit was arranged.  By review of notes, she did go to [facility] ED on 11/05/2019 for evaluation of acutely worsening dyspnea after having received steroid injections in her knee. CBC showed WBC 8.3, Hgb 12.9, platelets 433, Na+ 133, K+ 3.8 and creatinine 0.61. BNP 51. HS Troponin negative at 6. TSH 7.117. D-dimer negative. CXR showed no acute findings. EKG showed sinus rhythm with known RBBB. Was discharged home with outpatient follow-up recommended.   In talking with the patient today, she reports having episodes of dyspnea on exertion for quite some time which can occur with minimal activity such as getting dressed in the morning or walking around her home. Denies any associated chest pain. She does experience occasional palpitations but reports they spontaneously resolve. No recent orthopnea, PND or lower extremity edema.  Past Medical History:  Diagnosis Date  . Allergic rhinitis   . B12 deficiency   . Bipolar disorder ([place])   . Essential hypertension   . GERD (gastroesophageal reflux disease)   . Hashimoto's thyroiditis   . History of transient ischemic  attack (TIA)    Or possibly migraine as well as right amaurosis fugax and ataxia - Dr. Erling Cruz  . Hyperlipidemia   . PSVT (paroxysmal supraventricular tachycardia) ([place])     Past Surgical History:  Procedure Laterality Date  . LAPAROSCOPIC CHOLECYSTECTOMY  2010  . THYROIDECTOMY  2009    Current Medications: Outpatient Medications Prior to Visit  Medication Sig Dispense Refill  . Ascorbic Acid (VITAMIN C PO) Take 1 tablet by mouth daily.    Marland Kitchen aspirin EC 81 MG tablet Take 81 mg by mouth daily.    Marland Kitchen buPROPion (WELLBUTRIN XL) 300 MG 24 hr tablet Take 300 mg by mouth daily.     . calcium carbonate (TUMS - DOSED IN MG ELEMENTAL CALCIUM) 500 MG chewable tablet Chew 4 tablets by mouth daily.    . clonazePAM (KLONOPIN) 1 MG tablet Take 1 mg by mouth at bedtime. May take up to 3 times daily as needed for anxiety    . diltiazem (CARDIZEM CD) 240 MG 24 hr capsule Take 1 capsule (240 mg total) by mouth daily. 90 capsule 3  . DULoxetine (CYMBALTA) 60 MG capsule Take 60 mg by mouth 2 (two) times daily.     . famotidine (PEPCID) 10 MG tablet Take 10 mg by mouth 2 (two) times daily.    . fluconazole (DIFLUCAN) 200 MG tablet Take one dose by mouth, wait 72 hours, and then take second dose by mouth 2 tablet 0  . losartan (COZAAR) 50 MG tablet Take 50 mg  by mouth daily.    . meloxicam (MOBIC) 15 MG tablet TAKE ONE TABLET BY MOUTHMONCE DAILY FOR 2 WEEKS; THEN AS NEEDED.    . Multiple Vitamin (MULTIVITAMIN) tablet Take 1 tablet by mouth daily.    . pantoprazole (PROTONIX) 40 MG tablet Take 40 mg by mouth every morning.    . Polysaccharide Iron Complex (IRON UP) 15 MG/0.5ML LIQD Take 15 mLs by mouth daily.    . Saccharomyces boulardii (FLORASTOR PO) Take 1 tablet by mouth in the morning and at bedtime.    Marland Kitchen SYNTHROID 112 MCG tablet Take 112 mcg by mouth every morning.    . vitamin B-12 (CYANOCOBALAMIN) 500 MCG tablet Take 500 mcg by mouth daily.    Marland Kitchen ibuprofen (ADVIL) 800 MG tablet Take 1 tablet (800 mg total)  by mouth every 12 (twelve) hours as needed. 10 tablet 0  . methocarbamol (ROBAXIN) 500 MG tablet Take 500 mg by mouth at bedtime.     No facility-administered medications prior to visit.     Allergies:   Penicillins, Amoxicillin-pot clavulanate, Atenolol, and Sulfonamide derivatives   Social History   Socioeconomic History  . Marital status: Married    Spouse name: Not on file  . Number of children: 2  . Years of education: Not on file  . Highest education level: Not on file  Occupational History  . Occupation: homemaker    Employer: RETIRED  Tobacco Use  . Smoking status: Former Smoker    Packs/day: 0.50    Years: 10.00    Pack years: 5.00    Types: Cigarettes    Quit date: 01/26/1983    Years since quitting: 36.8  . Smokeless tobacco: Never Used  . Tobacco comment: quit 30 years ago  Vaping Use  . Vaping Use: Never used  Substance and Sexual Activity  . Alcohol use: No    Alcohol/week: 0.0 standard drinks  . Drug use: No  . Sexual activity: Never  Other Topics Concern  . Not on file  Social History Narrative   Regular exercise: walk when able   Caffeine use: occasionally   Social Determinants of Health   Financial Resource Strain:   . Difficulty of Paying Living Expenses: Not on file  Food Insecurity:   . Worried About Charity fundraiser in the Last Year: Not on file  . Ran Out of Food in the Last Year: Not on file  Transportation Needs:   . Lack of Transportation (Medical): Not on file  . Lack of Transportation (Non-Medical): Not on file  Physical Activity:   . Days of Exercise per Week: Not on file  . Minutes of Exercise per Session: Not on file  Stress:   . Feeling of Stress : Not on file  Social Connections:   . Frequency of Communication with Friends and Family: Not on file  . Frequency of Social Gatherings with Friends and Family: Not on file  . Attends Religious Services: Not on file  . Active Member of Clubs or Organizations: Not on file  .  Attends Archivist Meetings: Not on file  . Marital Status: Not on file     Family History:  The patient's family history includes Aneurysm in her sister; Depression in her father; Diabetes in her brother; Heart attack in her brother; Heart failure in her brother, maternal grandfather, maternal grandmother, and paternal grandmother; Hypertension in her brother and sister; Stroke in her maternal grandfather, maternal grandmother, and mother; Thyroid disease in her brother and  mother.   Review of Systems:   Please see the history of present illness.     General:  No chills, fever, night sweats or weight changes.  Cardiovascular:  No chest pain, edema, orthopnea, paroxysmal nocturnal dyspnea. Positive for palpitations and dyspnea on exertion.  Dermatological: No rash, lesions/masses Respiratory: No cough, dyspnea Urologic: No hematuria, dysuria Abdominal:   No nausea, vomiting, diarrhea, bright red blood per rectum, melena, or hematemesis Neurologic:  No visual changes, wkns, changes in mental status. All other systems reviewed and are otherwise negative except as noted above.   Physical Exam:    VS:  BP 132/78   Pulse 72   Ht 5\' 2"  (1.575 m)   Wt 178 lb 9.6 oz (81 kg)   SpO2 99%   BMI 32.67 kg/m    General: Well developed, well nourished,female appearing in no acute distress. Head: Normocephalic, atraumatic. Neck: No carotid bruits. JVD not elevated.  Lungs: Respirations regular and unlabored, without wheezes or rales.  Heart: Regular rate and rhythm. No S3 or S4.  No murmur, no rubs, or gallops appreciated. Abdomen: Appears non-distended. No obvious abdominal masses. Msk:  Strength and tone appear normal for age. No obvious joint deformities or effusions. Extremities: No clubbing or cyanosis. No lower extremity edema.  Distal pedal pulses are 2+ bilaterally. Neuro: Alert and oriented X 3. Moves all extremities spontaneously. No focal deficits noted. Psych:  Responds to  questions appropriately with a normal affect. Skin: No rashes or lesions noted  Wt Readings from Last 3 Encounters:  11/08/19 178 lb 9.6 oz (81 kg)  09/25/19 181 lb 9.6 oz (82.4 kg)  08/22/19 178 lb 9.2 oz (81 kg)     Studies/Labs Reviewed:   EKG:  EKG is not ordered today. EKG from 11/05/2019 is reviewed and shows NSR, HR 77 with known RBBB.   Recent Labs: 11/05/2019: ALT 14; B Natriuretic Peptide 51.1; BUN 7; Creatinine, Ser 0.61; Hemoglobin 12.9; Platelets 433; Potassium 3.8; Sodium 133; TSH 7.117   Lipid Panel No results found for: CHOL, TRIG, HDL, CHOLHDL, VLDL, LDLCALC, LDLDIRECT  Additional studies/ records that were reviewed today include:   Echocardiogram: 06/2019 IMPRESSIONS    1. Left ventricular ejection fraction, by estimation, is 65 to 70%. The  left ventricle has normal function. The left ventricle has no regional  wall motion abnormalities. There is mild left ventricular hypertrophy.  Left ventricular diastolic parameters  are indeterminate.  2. Right ventricular systolic function is normal. The right ventricular  size is normal. Tricuspid regurgitation signal is inadequate for assessing  PA pressure.  3. Left atrial size was mildly dilated.  4. The mitral valve is grossly normal. Trivial mitral valve  regurgitation.  5. The aortic valve is tricuspid. Aortic valve regurgitation is not  visualized.  6. The inferior vena cava is normal in size with greater than 50%  respiratory variability, suggesting right atrial pressure of 3 mmHg.    Event Monitor: 06/2019 ZIO XT reviewed.  7 days 2 hours analyzed.  Predominant rhythm is sinus with heart rate ranging from 54 bpm up to 113 bpm and average heart rate 77 bpm.  There were rare PACs and PVCs representing less than 1% total beats.  Episodes described as atrial fibrillation/flutter look to be more likely PSVT, fairly regular SVT with at times retrograde P waves.  Longest episode lasted for nearly 35  minutes.   Assessment:    1. Dyspnea on exertion   2. PSVT (paroxysmal supraventricular tachycardia) ([place])  3. Essential hypertension      Plan:   In order of problems listed above:  1. Dyspnea on Exertion - She reports progressive episodes of dyspnea on exertion over the past several weeks which occurs sporadically but can occur with minimal activity. Sometimes worse when she is anxious. Underwent ED evaluation earlier this week and her work-up at that time was reassuring. Reviewed options with the patient and will arrange for a Lexiscan Myoview to rule-out an ischemic etiology.   2. SVT - As noted on her monitor earlier this year. She does report occasional palpitations but denies any persistent symptoms. Continue Cardizem CD 240mg  daily. We reviewed this could be further titrated if her symptoms increase in frequency or severity.   3. HTN - BP was initially elevated but improved to 132/78 on recheck. Continue current medication regimen with Cardizem CD 240mg  daily and Losartan 50mg  daily.    Medication Adjustments/Labs and Tests Ordered: Current medicines are reviewed at length with the patient today.  Concerns regarding medicines are outlined above.  Medication changes, Labs and Tests ordered today are listed in the Patient Instructions below. Patient Instructions  Medication Instructions:  Your physician recommends that you continue on your current medications as directed. Please refer to the Current Medication list given to you today.  *If you need a refill on your cardiac medications before your next appointment, please call your pharmacy*   Lab Work: NONE   If you have labs (blood work) drawn today and your tests are completely normal, you will receive your results only by: Marland Kitchen MyChart Message (if you have MyChart) OR . A paper copy in the mail If you have any lab test that is abnormal or we need to change your treatment, we will call you to review the  results.   Testing/Procedures: Your physician has requested that you have a lexiscan myoview. For further information please visit [email or web address]. Please follow instruction sheet, as given.    Follow-Up: At [facility], you and your health needs are our priority.  As part of our continuing mission to provide you with exceptional heart care, we have created designated Provider Care Teams.  These Care Teams include your primary Cardiologist (physician) and Advanced Practice Providers (APPs -  Physician Assistants and Nurse Practitioners) who all work together to provide you with the care you need, when you need it.  We recommend signing up for the patient portal called "MyChart".  Sign up information is provided on this After Visit Summary.  MyChart is used to connect with patients for Virtual Visits (Telemedicine).  Patients are able to view lab/test results, encounter notes, upcoming appointments, etc.  Non-urgent messages can be sent to your provider as well.   To learn more about what you can do with MyChart, go to [email or web address].    Your next appointment:   5 month(s)  The format for your next appointment:   In Person  Provider:   Rozann Lesches, MD   Other Instructions Thank you for choosing [facility]!       Signed, Erma Heritage, PA-C  11/08/2019 8:04 PM    [facility] [address] [place],  [address] Phone: [phone] Fax: [phone]

## 2019-11-08 NOTE — Patient Instructions (Signed)
Medication Instructions:  Your physician recommends that you continue on your current medications as directed. Please refer to the Current Medication list given to you today.  *If you need a refill on your cardiac medications before your next appointment, please call your pharmacy*   Lab Work: NONE   If you have labs (blood work) drawn today and your tests are completely normal, you will receive your results only by: Marland Kitchen MyChart Message (if you have MyChart) OR . A paper copy in the mail If you have any lab test that is abnormal or we need to change your treatment, we will call you to review the results.   Testing/Procedures: Your physician has requested that you have a lexiscan myoview. For further information please visit [email or web address]. Please follow instruction sheet, as given.    Follow-Up: At [facility], you and your health needs are our priority.  As part of our continuing mission to provide you with exceptional heart care, we have created designated Provider Care Teams.  These Care Teams include your primary Cardiologist (physician) and Advanced Practice Providers (APPs -  Physician Assistants and Nurse Practitioners) who all work together to provide you with the care you need, when you need it.  We recommend signing up for the patient portal called "MyChart".  Sign up information is provided on this After Visit Summary.  MyChart is used to connect with patients for Virtual Visits (Telemedicine).  Patients are able to view lab/test results, encounter notes, upcoming appointments, etc.  Non-urgent messages can be sent to your provider as well.   To learn more about what you can do with MyChart, go to [email or web address].    Your next appointment:   5 month(s)  The format for your next appointment:   In Person  Provider:   Rozann Lesches, MD   Other Instructions Thank you for choosing [facility]!

## 2019-11-09 ENCOUNTER — Encounter (HOSPITAL_COMMUNITY): Payer: PPO | Admitting: Physical Therapy

## 2019-11-12 ENCOUNTER — Encounter (HOSPITAL_COMMUNITY): Payer: PPO | Admitting: Physical Therapy

## 2019-11-14 ENCOUNTER — Ambulatory Visit
Admission: RE | Admit: 2019-11-14 | Discharge: 2019-11-14 | Disposition: A | Discharge status: Home / Self Care | Payer: PPO | Source: Ambulatory Visit | Attending: Family Medicine | Admitting: Family Medicine

## 2019-11-14 ENCOUNTER — Other Ambulatory Visit: Payer: Self-pay

## 2019-11-14 DIAGNOSIS — R413 Other amnesia: Secondary | ICD-10-CM

## 2019-11-14 DIAGNOSIS — R42 Dizziness and giddiness: Secondary | ICD-10-CM | POA: Diagnosis not present

## 2019-11-14 DIAGNOSIS — R270 Ataxia, unspecified: Secondary | ICD-10-CM

## 2019-11-14 MED ORDER — GADOBENATE DIMEGLUMINE 529 MG/ML IV SOLN
17.0000 mL | Freq: Once | INTRAVENOUS | Status: AC | PRN
  Administered 2019-11-14: 17 mL via INTRAVENOUS

## 2019-11-15 ENCOUNTER — Encounter (HOSPITAL_COMMUNITY): Payer: PPO | Admitting: Physical Therapy

## 2019-11-15 ENCOUNTER — Encounter (HOSPITAL_COMMUNITY)
Admission: RE | Admit: 2019-11-15 | Discharge: 2019-11-15 | Disposition: A | Discharge status: Home / Self Care | Payer: PPO | Source: Ambulatory Visit | Attending: Student | Admitting: Student

## 2019-11-15 ENCOUNTER — Encounter (HOSPITAL_COMMUNITY): Payer: Self-pay

## 2019-11-15 ENCOUNTER — Encounter (HOSPITAL_BASED_OUTPATIENT_CLINIC_OR_DEPARTMENT_OTHER)
Admission: RE | Admit: 2019-11-15 | Discharge: 2019-11-15 | Disposition: A | Discharge status: Home / Self Care | Payer: PPO | Source: Ambulatory Visit | Attending: Student | Admitting: Student

## 2019-11-15 DIAGNOSIS — R0600 Dyspnea, unspecified: Secondary | ICD-10-CM | POA: Insufficient documentation

## 2019-11-15 LAB — NM MYOCAR MULTI W/SPECT W/WALL MOTION / EF
LV dias vol: 53 mL (ref 46–106)
LV sys vol: 30 mL
Peak HR: 86 {beats}/min
RATE: 0.29
Rest HR: 62 {beats}/min
SDS: 1
SRS: 1
SSS: 2
TID: 1.34

## 2019-11-15 MED ORDER — TECHNETIUM TC 99M TETROFOSMIN IV KIT
30.0000 | PACK | Freq: Once | INTRAVENOUS | Status: AC | PRN
  Administered 2019-11-15: 29 via INTRAVENOUS

## 2019-11-15 MED ORDER — REGADENOSON 0.4 MG/5ML IV SOLN
INTRAVENOUS | Status: AC
  Administered 2019-11-15: 0.4 mg via INTRAVENOUS
  Filled 2019-11-15: qty 5

## 2019-11-15 MED ORDER — TECHNETIUM TC 99M TETROFOSMIN IV KIT
10.0000 | PACK | Freq: Once | INTRAVENOUS | Status: AC | PRN
  Administered 2019-11-15: 10.3 via INTRAVENOUS

## 2019-11-15 MED ORDER — SODIUM CHLORIDE FLUSH 0.9 % IV SOLN
INTRAVENOUS | Status: AC
  Administered 2019-11-15: 10 mL via INTRAVENOUS
  Filled 2019-11-15: qty 10

## 2019-11-19 ENCOUNTER — Encounter (HOSPITAL_COMMUNITY): Payer: PPO | Admitting: Physical Therapy

## 2019-11-22 ENCOUNTER — Ambulatory Visit: Payer: PPO | Admitting: Cardiology

## 2019-11-22 ENCOUNTER — Encounter (HOSPITAL_COMMUNITY): Payer: PPO | Admitting: Physical Therapy

## 2019-11-26 ENCOUNTER — Encounter (HOSPITAL_COMMUNITY): Payer: PPO | Admitting: Physical Therapy

## 2019-11-26 NOTE — Telephone Encounter (Addendum)
LATE ENTRY: 11/09/19: Notice of denial of medical coverage from [employer]. . Did not meet criteria for in lab sleep study

## 2019-11-29 ENCOUNTER — Encounter (HOSPITAL_COMMUNITY): Payer: PPO | Admitting: Physical Therapy

## 2019-12-12 DIAGNOSIS — F332 Major depressive disorder, recurrent severe without psychotic features: Secondary | ICD-10-CM | POA: Diagnosis not present

## 2019-12-17 ENCOUNTER — Encounter (HOSPITAL_COMMUNITY): Payer: Self-pay | Admitting: Physical Therapy

## 2019-12-17 ENCOUNTER — Other Ambulatory Visit: Payer: Self-pay

## 2019-12-17 ENCOUNTER — Ambulatory Visit (HOSPITAL_COMMUNITY): Payer: PPO | Attending: Otolaryngology | Admitting: Physical Therapy

## 2019-12-17 DIAGNOSIS — Z9181 History of falling: Secondary | ICD-10-CM | POA: Diagnosis not present

## 2019-12-17 DIAGNOSIS — R262 Difficulty in walking, not elsewhere classified: Secondary | ICD-10-CM | POA: Diagnosis not present

## 2019-12-17 DIAGNOSIS — R42 Dizziness and giddiness: Secondary | ICD-10-CM | POA: Insufficient documentation

## 2019-12-17 NOTE — Therapy (Signed)
[facility], [place], [address] Phone: [phone]   Fax:  [phone]  Physical Therapy Treatment. RECERT and progress note  Patient Details  Name: [name] MRN: [id number] Date of Birth: 1945-04-10 Referring Provider (PT): su teoh  Progress Note Reporting Period 10/24/19 to 12/17/19  See note below for Objective Data and Assessment of Progress/Goals.       Encounter Date: 12/17/2019   PT End of Session - 12/17/19 1608    Visit Number 4    Number of Visits 12    Date for PT Re-Evaluation 01/28/20    Authorization Type [employer] no VL, no auth    Progress Note Due on Visit 10    PT Start Time 1610    PT Stop Time 1655    PT Time Calculation (min) 45 min    Equipment Utilized During Treatment Gait belt    Activity Tolerance Patient tolerated treatment well    Behavior During Therapy WFL for tasks assessed/performed           Past Medical History:  Diagnosis Date   Allergic rhinitis    B12 deficiency    Bipolar disorder ([place])    Essential hypertension    GERD (gastroesophageal reflux disease)    Hashimoto's thyroiditis    History of transient ischemic attack (TIA)    Or possibly migraine as well as right amaurosis fugax and ataxia - Dr. Erling Cruz   Hyperlipidemia    PSVT (paroxysmal supraventricular tachycardia) ([place])     Past Surgical History:  Procedure Laterality Date   LAPAROSCOPIC CHOLECYSTECTOMY  2010   THYROIDECTOMY  2009    There were no vitals filed for this visit.   Subjective Assessment - 12/17/19 1616    Subjective States she is not having a good day, she has been off balance. States that she has been having her balance issue for many years. States that she fell last week in her kitchen and reports bruising on her right side from hitting the kitchen cabinet. States that she stumbles a lot but she catches herself. Reports no pain today. States she is just  having a sluggish day. Patient reported she is waiting on the MD about her knee in regards to having a bad knee.    Pertinent History blacks out, hx of falls    Patient Stated Goals states she would like to walk up and down an aisle and be straight and not be afraid she is going to fall              [facility] PT Assessment - 12/17/19 0001      Assessment   Medical Diagnosis dizziness    Referring Provider (PT) su teoh      Balance Screen   Has the patient fallen in the past 6 months Yes    How many times? 5    Has the patient had a decrease in activity level because of a fear of falling?  Yes    Is the patient reluctant to leave their home because of a fear of falling?  Yes      Observation/Other Assessments   Focus on Therapeutic Outcomes (FOTO)  54% function    was 50% function      Transfers   Five time sit to stand comments  18.3 seconds - pain in left knee after/during      Standardized Balance Assessment   Standardized Balance Assessment Dynamic Gait Index  Dynamic Gait Index   Level Surface Mild Impairment    Change in Gait Speed Mild Impairment    Gait with Horizontal Head Turns Moderate Impairment    Gait with Vertical Head Turns Moderate Impairment    Gait and Pivot Turn Severe Impairment    Step Over Obstacle Severe Impairment    Step Around Obstacles Severe Impairment    Steps Moderate Impairment    Total Score 7                         [facility] Adult PT Treatment/Exercise - 12/17/19 0001      Exercises   Exercises Knee/Hip      Knee/Hip Exercises: Standing   Hip Abduction AROM;Stengthening;3 sets;5 reps;Both;Knee straight   with UE support     Knee/Hip Exercises: Seated   Sit to Sand 5 reps;without UE support   4 sets                 PT Education - 12/17/19 1700    Education Details on current functional status, DGI score, plan moving forward.    Person(s) Educated Patient    Methods Explanation    Comprehension Verbalized  understanding            PT Short Term Goals - 12/17/19 1617      PT SHORT TERM GOAL #1   Title Patient will report at least 25% improvement in overall symptoms and/or function to demonstrate improved functional mobility    Baseline 0% better    Time 3    Period Weeks    Status On-going    Target Date 11/14/19      PT SHORT TERM GOAL #2   Title Patient will be able to stand on either leg for atleast 8 seconds to demonstrate improved static balance    Time 3    Period Weeks    Status On-going    Target Date 11/14/19      PT SHORT TERM GOAL #3   Title Patient will be independent in self management strategies to improve quality of life and functional outcomes.    Baseline reports non-adherence - reports she lives by herself and fears she will fall    Time 3    Period Weeks    Status On-going    Target Date 11/14/19             PT Long Term Goals - 12/17/19 1618      PT LONG TERM GOAL #1   Title Patient will report at least 50% improvement in overall symptoms and/or function to demonstrate improved functional mobility    Baseline 0%    Time 6    Period Weeks    Status On-going      PT LONG TERM GOAL #2   Title Patient will be able to improve in DGI score by at least 4 points to demonstrate improved dynamic balance    Baseline --    Time 6    Period Weeks    Status On-going      PT LONG TERM GOAL #3   Title Patient will improve on FOTO score to meet predicted outcomes to demonstrate improved functional mobility.    Time 6    Period Weeks    Status On-going                 Plan - 12/17/19 1645    Clinical Impression Statement Patient returns to therapy after hiatus  where she stayed with her daughter in [place]. Patient would like to continue to work on her balance and dizziness symptoms. Reassessed patient and no short or long term goals met at this time but that is to be expected secondary to stop in therapy. During DGI, left leg had an episode where  it started to walk on its own and patient required moderate assist to prevent a loss of balance. Rest breaks taken during DGI secondary to dizziness. Another incident occurred during DGI with stepping around cones where patient started side stepping right, asking for help and required moderate to maximum assist to prevent loss of balance and could not stand up straight without PT assist. Patient leaned on PT for about 30 seconds as if she forgot how to stand up. Focused on education and developing home program patient felt safe to perform at home. Patients symptoms unlike vestibular issue and most likely due to deconditioning and lack of overall activity level over the last year. Extending POC to focus on improving functional endurance and strength and balance, which in turn is expected to improve her dizziness symptoms.    Personal Factors and Comorbidities Comorbidity 1;Comorbidity 2;Comorbidity 3+    Comorbidities short of breath, right ear pain, left cataracts, hx of falls    Examination-Activity Limitations Bathing;Bed Mobility;Carry;Locomotion Level;Lift;Transfers;Stand;Stairs;Squat    Examination-Participation Restrictions Community Activity;Shop    Stability/Clinical Decision Making Evolving/Moderate complexity    Rehab Potential Fair    PT Frequency 2x / week    PT Duration 6 weeks    PT Treatment/Interventions ADLs/Self Care Home Management;Balance training;Therapeutic exercise;Therapeutic activities;Functional mobility training;Stair training;Gait training;DME Instruction;Neuromuscular re-education;Manual techniques    PT Next Visit Plan LE strengthening, balance exercises, have hands on her for balance exercises as her feet "wander" on her    PT Home Exercise Plan x1 gaze stabilization (VOR 1, saccades); Standing hip abd, STS    Consulted and Agree with Plan of Care Patient           Patient will benefit from skilled therapeutic intervention in order to improve the following deficits  and impairments:  Abnormal gait, Decreased endurance, Decreased activity tolerance, Decreased balance, Difficulty walking, Decreased range of motion, Decreased safety awareness, Decreased knowledge of use of DME, Pain  Visit Diagnosis: Difficulty in walking, not elsewhere classified  History of falling  Dizziness and giddiness     Problem List Patient Active Problem List   Diagnosis Date Noted   Dizziness, nonspecific 07/23/2014   Postsurgical hypothyroidism 12/08/2012   PSVT (paroxysmal supraventricular tachycardia) ([place]) 03/12/2011   HYPERLIPIDEMIA 11/06/2009   ESSENTIAL HYPERTENSION, BENIGN 11/06/2009   PALPITATIONS 11/06/2009    5:04 PM, 12/17/19 Jerene Pitch, DPT Physical Therapy with [facility]  [phone] office  [facility] [address] [place], [place], [address] Phone: [phone]   Fax:  [phone]  Name: [name] MRN: [id number] Date of Birth: April 28, 1945

## 2019-12-18 ENCOUNTER — Other Ambulatory Visit: Payer: Self-pay | Admitting: Family Medicine

## 2019-12-18 DIAGNOSIS — R270 Ataxia, unspecified: Secondary | ICD-10-CM

## 2019-12-18 DIAGNOSIS — R42 Dizziness and giddiness: Secondary | ICD-10-CM

## 2019-12-18 DIAGNOSIS — R413 Other amnesia: Secondary | ICD-10-CM

## 2019-12-29 ENCOUNTER — Other Ambulatory Visit: Payer: Self-pay

## 2019-12-29 ENCOUNTER — Encounter: Payer: Self-pay | Admitting: Emergency Medicine

## 2019-12-29 ENCOUNTER — Emergency Department
Admission: EM | Admit: 2019-12-29 | Discharge: 2019-12-30 | Disposition: A | Discharge status: Home / Self Care | Payer: PPO | Attending: Emergency Medicine | Admitting: Emergency Medicine

## 2019-12-29 DIAGNOSIS — Z7982 Long term (current) use of aspirin: Secondary | ICD-10-CM | POA: Insufficient documentation

## 2019-12-29 DIAGNOSIS — R443 Hallucinations, unspecified: Secondary | ICD-10-CM | POA: Diagnosis not present

## 2019-12-29 DIAGNOSIS — I10 Essential (primary) hypertension: Secondary | ICD-10-CM | POA: Diagnosis not present

## 2019-12-29 DIAGNOSIS — Z79899 Other long term (current) drug therapy: Secondary | ICD-10-CM | POA: Diagnosis not present

## 2019-12-29 DIAGNOSIS — Z046 Encounter for general psychiatric examination, requested by authority: Secondary | ICD-10-CM | POA: Diagnosis present

## 2019-12-29 DIAGNOSIS — Z8673 Personal history of transient ischemic attack (TIA), and cerebral infarction without residual deficits: Secondary | ICD-10-CM | POA: Diagnosis not present

## 2019-12-29 DIAGNOSIS — Z87891 Personal history of nicotine dependence: Secondary | ICD-10-CM | POA: Diagnosis not present

## 2019-12-29 DIAGNOSIS — R441 Visual hallucinations: Secondary | ICD-10-CM | POA: Diagnosis not present

## 2019-12-29 LAB — URINE DRUG SCREEN, QUALITATIVE (ARMC ONLY)
Amphetamines, Ur Screen: NOT DETECTED
Barbiturates, Ur Screen: NOT DETECTED
Benzodiazepine, Ur Scrn: NOT DETECTED
Cannabinoid 50 Ng, Ur ~~LOC~~: NOT DETECTED
Cocaine Metabolite,Ur ~~LOC~~: NOT DETECTED
MDMA (Ecstasy)Ur Screen: NOT DETECTED
Methadone Scn, Ur: NOT DETECTED
Opiate, Ur Screen: NOT DETECTED
Phencyclidine (PCP) Ur S: NOT DETECTED
Tricyclic, Ur Screen: NOT DETECTED

## 2019-12-29 LAB — CBC
HCT: 37.1 % (ref 36.0–46.0)
Hemoglobin: 12.9 g/dL (ref 12.0–15.0)
MCH: 32.9 pg (ref 26.0–34.0)
MCHC: 34.8 g/dL (ref 30.0–36.0)
MCV: 94.6 fL (ref 80.0–100.0)
Platelets: 386 10*3/uL (ref 150–400)
RBC: 3.92 MIL/uL (ref 3.87–5.11)
RDW: 12.3 % (ref 11.5–15.5)
WBC: 9 10*3/uL (ref 4.0–10.5)
nRBC: 0 % (ref 0.0–0.2)

## 2019-12-29 LAB — COMPREHENSIVE METABOLIC PANEL
ALT: 19 U/L (ref 0–44)
AST: 19 U/L (ref 15–41)
Albumin: 4.1 g/dL (ref 3.5–5.0)
Alkaline Phosphatase: 81 U/L (ref 38–126)
Anion gap: 9 (ref 5–15)
BUN: 10 mg/dL (ref 8–23)
CO2: 26 mmol/L (ref 22–32)
Calcium: 9.1 mg/dL (ref 8.9–10.3)
Chloride: 103 mmol/L (ref 98–111)
Creatinine, Ser: 0.75 mg/dL (ref 0.44–1.00)
GFR, Estimated: >60 mL/min (ref >60)
Glucose, Bld: 89 mg/dL (ref 70–99)
Potassium: 4.6 mmol/L (ref 3.5–5.1)
Sodium: 138 mmol/L (ref 135–145)
Total Bilirubin: 0.4 mg/dL (ref 0.3–1.2)
Total Protein: 7 g/dL (ref 6.5–8.1)

## 2019-12-29 LAB — SALICYLATE LEVEL: Salicylate Lvl: <7 mg/dL — ABNORMAL LOW (ref 7.0–30.0)

## 2019-12-29 LAB — ETHANOL: Alcohol, Ethyl (B): <10 mg/dL (ref <10)

## 2019-12-29 LAB — ACETAMINOPHEN LEVEL: Acetaminophen (Tylenol), Serum: <10 ug/mL — ABNORMAL LOW (ref 10–30)

## 2019-12-29 NOTE — ED Triage Notes (Signed)
Patient brought in with daughter. Patient has a history of anxiety and bipolar. Patient has started having hallucinations. Patient was prescribed medication for her hallucinations but has been unable to get the medication so far due to insurance. The patient was given another medication to help with hallucinations in the meantime. The medication has helped but still hallucinating. Daughter reports that the patient has been sleeping 18 - 20 hours a day. Daughter is requesting in patient admission.

## 2019-12-30 LAB — T4, FREE: Free T4: 0.91 ng/dL (ref 0.61–1.12)

## 2019-12-30 LAB — URINALYSIS, COMPLETE (UACMP) WITH MICROSCOPIC
Bilirubin Urine: NEGATIVE
Glucose, UA: NEGATIVE mg/dL
Hgb urine dipstick: NEGATIVE
Ketones, ur: NEGATIVE mg/dL
Leukocytes,Ua: NEGATIVE
Nitrite: NEGATIVE
Protein, ur: NEGATIVE mg/dL
Specific Gravity, Urine: 1.003 — ABNORMAL LOW (ref 1.005–1.030)
pH: 6 (ref 5.0–8.0)

## 2019-12-30 LAB — TSH: TSH: 3.657 u[IU]/mL (ref 0.350–4.500)

## 2019-12-30 NOTE — ED Notes (Signed)
TTS at bedside with patient and patient's daughter.

## 2019-12-30 NOTE — ED Notes (Signed)
Pt once again able to ambulate to the bathroom independently without difficulty.

## 2019-12-30 NOTE — ED Notes (Signed)
No peripheral IV placed this visit.   the patient and daughter ambulatory to DC without difficulty and no belongings left in room  Discharge instructions reviewed with patient. Questions fielded by this RN. Patient verbalizes understanding of instructions. Patient discharged home in stable condition per [facility] . No acute distress noted at time of discharge.

## 2019-12-30 NOTE — ED Provider Notes (Signed)
[facility] Emergency Department Provider Note  ____________________________________________  Time seen: Approximately 2:45 AM  I have reviewed the triage vital signs and the nursing notes.   HISTORY  Chief Complaint Psychiatric Evaluation   HPI [name] is a 74 y.o. female with a history of B12 deficiency, bipolar disorder, hypertension, Hashimoto's thyroiditis, hyperlipidemia who was brought in by her daughter for psychiatric evaluation.  According to the daughter and the patient, patient has been having hallucinations for a while now.  Has been seeing people in her house.  Has been working with her primary care doctor who recently started her on Zyprexa.  Since being on Zyprexa patient reports that her hallucinations went from daily to just once a week.  Patient recently lost her husband on January 2020 and is still grieving.   Daughter lives in [place] and has been concerned about the health of her mother.  No suicidal homicidal thoughts.  Patient is very with it and able to provide her full history.  She has good insight into her hallucinations.  There is no suicidal homicidal thoughts.  Daughter is concerned the patient sleeps most of the day.  Patient is on several sedating medications at home.  Patient reports having daily activities that she enjoys, reads, has friends that check on her. Has been eating normally.  Past Medical History:  Diagnosis Date  . Allergic rhinitis   . B12 deficiency   . Bipolar disorder ([place])   . Essential hypertension   . GERD (gastroesophageal reflux disease)   . Hashimoto's thyroiditis   . History of transient ischemic attack (TIA)    Or possibly migraine as well as right amaurosis fugax and ataxia - Dr. Erling Cruz  . Hyperlipidemia   . PSVT (paroxysmal supraventricular tachycardia) [facility])     Patient Active Problem List   Diagnosis Date Noted  . Dizziness, nonspecific 07/23/2014  . Postsurgical hypothyroidism  12/08/2012  . PSVT (paroxysmal supraventricular tachycardia) ([place]) 03/12/2011  . HYPERLIPIDEMIA 11/06/2009  . ESSENTIAL HYPERTENSION, BENIGN 11/06/2009  . PALPITATIONS 11/06/2009    Past Surgical History:  Procedure Laterality Date  . LAPAROSCOPIC CHOLECYSTECTOMY  2010  . THYROIDECTOMY  2009    Prior to Admission medications   Medication Sig Start Date End Date Taking? Authorizing Provider  Ascorbic Acid (VITAMIN C PO) Take 1 tablet by mouth daily.    [provider]  aspirin EC 81 MG tablet Take 81 mg by mouth daily.    [provider]  buPROPion (WELLBUTRIN XL) 300 MG 24 hr tablet Take 300 mg by mouth daily.  02/08/14   [provider]  calcium carbonate (TUMS - DOSED IN MG ELEMENTAL CALCIUM) 500 MG chewable tablet Chew 4 tablets by mouth daily.    [provider]  clonazePAM (KLONOPIN) 1 MG tablet Take 1 mg by mouth at bedtime. May take up to 3 times daily as needed for anxiety      diltiazem (CARDIZEM CD) 240 MG 24 hr capsule Take 1 capsule (240 mg total) by mouth daily. 07/16/19 11/08/19  Satira Sark, MD  DULoxetine (CYMBALTA) 60 MG capsule Take 60 mg by mouth 2 (two) times daily.  01/29/14   [provider]  famotidine (PEPCID) 10 MG tablet Take 10 mg by mouth 2 (two) times daily.    [provider]  fluconazole (DIFLUCAN) 200 MG tablet Take one dose by mouth, wait 72 hours, and then take second dose by mouth 08/22/19   Wurst, [place], PA-C  losartan (  COZAAR) 50 MG tablet Take 50 mg by mouth daily.    [provider]  meloxicam (MOBIC) 15 MG tablet TAKE ONE TABLET BY MOUTHMONCE DAILY FOR 2 WEEKS; THEN AS NEEDED. 07/06/19   [provider]  Multiple Vitamin (MULTIVITAMIN) tablet Take 1 tablet by mouth daily.    [provider]  pantoprazole (PROTONIX) 40 MG tablet Take 40 mg by mouth every morning. 09/11/19   [provider]  Polysaccharide Iron Complex (IRON UP) 15 MG/0.5ML LIQD Take 15 mLs by  mouth daily.    [provider]  Saccharomyces boulardii (FLORASTOR PO) Take 1 tablet by mouth in the morning and at bedtime.    [provider]  SYNTHROID 112 MCG tablet Take 112 mcg by mouth every morning. 07/19/19   [provider]  vitamin B-12 (CYANOCOBALAMIN) 500 MCG tablet Take 500 mcg by mouth daily.    [provider]    Allergies Penicillins, Amoxicillin-pot clavulanate, Atenolol, and Sulfonamide derivatives  Family History  Problem Relation Age of Onset  . Stroke Mother   . Thyroid disease Mother   . Depression Father   . Hypertension Sister   . Aneurysm Sister   . Diabetes Brother   . Heart attack Brother   . Hypertension Brother   . Heart failure Brother   . Thyroid disease Brother   . Stroke Maternal Grandmother   . Heart failure Maternal Grandmother   . Heart failure Maternal Grandfather   . Stroke Maternal Grandfather   . Heart failure Paternal Grandmother     Social History Social History   Tobacco Use  . Smoking status: Former Smoker    Packs/day: 0.50    Years: 10.00    Pack years: 5.00    Types: Cigarettes    Quit date: 01/26/1983    Years since quitting: 36.9  . Smokeless tobacco: Never Used  . Tobacco comment: quit 30 years ago  Vaping Use  . Vaping Use: Never used  Substance Use Topics  . Alcohol use: No    Alcohol/week: 0.0 standard drinks  . Drug use: No    Review of Systems  Constitutional: Negative for fever. Eyes: Negative for visual changes. ENT: Negative for sore throat. Neck: No neck pain  Cardiovascular: Negative for chest pain. Respiratory: Negative for shortness of breath. Gastrointestinal: Negative for abdominal pain, vomiting or diarrhea. Genitourinary: Negative for dysuria. Musculoskeletal: Negative for back pain. Skin: Negative for rash. Neurological: Negative for headaches, weakness or numbness. Psych: No SI or HI  ____________________________________________   PHYSICAL  EXAM:  VITAL SIGNS: ED Triage Vitals [12/29/19 1917]  Enc Vitals Group     BP (!) 141/71     Pulse Rate 67     Resp 18     Temp 98.5 F (36.9 C)     Temp Source Oral     SpO2 99 %     Weight 185 lb (83.9 kg)     Height 5\' 2"  (1.575 m)     Head Circumference      Peak Flow      Pain Score 0     Pain Loc      Pain Edu?      Excl. in [place]?     Constitutional: Alert and oriented. Well appearing and in no apparent distress. HEENT:      Head: Normocephalic and atraumatic.         Eyes: Conjunctivae are normal. Sclera is non-icteric.       Mouth/Throat: Mucous membranes  are moist.       Neck: Supple with no signs of meningismus. Cardiovascular: Regular rate and rhythm.  Respiratory: Normal respiratory effort.  Gastrointestinal: Soft, non tender, and non distended. Musculoskeletal: No edema, cyanosis, or erythema of extremities. Neurologic: Normal speech and language. Face is symmetric. Moving all extremities. No gross focal neurologic deficits are appreciated. Skin: Skin is warm, dry and intact. No rash noted. Psychiatric: Mood and affect are normal. Speech and behavior are normal.  ____________________________________________   LABS (all labs ordered are listed, but only abnormal results are displayed)  Labs Reviewed  SALICYLATE LEVEL - Abnormal; Notable for the following components:      Result Value   Salicylate Lvl <7.3 (*)    All other components within normal limits  ACETAMINOPHEN LEVEL - Abnormal; Notable for the following components:   Acetaminophen (Tylenol), Serum <10 (*)    All other components within normal limits  URINALYSIS, COMPLETE (UACMP) WITH MICROSCOPIC - Abnormal; Notable for the following components:   Color, Urine STRAW (*)    APPearance CLEAR (*)    Specific Gravity, Urine 1.003 (*)    Bacteria, UA RARE (*)    All other components within normal limits  COMPREHENSIVE METABOLIC PANEL  ETHANOL  CBC  URINE DRUG SCREEN, QUALITATIVE ([facility] ONLY)  T4,  FREE  TSH   ____________________________________________  EKG  none  ____________________________________________  RADIOLOGY  none  ____________________________________________   PROCEDURES  Procedure(s) performed: None Procedures Critical Care performed:  None ____________________________________________   INITIAL IMPRESSION / ASSESSMENT AND PLAN / ED COURSE   74 y.o. female with a history of B12 deficiency, bipolar disorder, hypertension, Hashimoto's thyroiditis, hyperlipidemia who was brought in by her daughter for psychiatric evaluation.  Patient with visual hallucinations that have been ongoing for several months.  Be evaluated by her primary care doctor.  Most likely multifactorial.  Patient and several medications with potential side effects of hallucinations, recently lost her husband, has history of thyroid disease and B12 deficiency.  Symptoms seem to be going on for several months and nothing acute.  No signs or symptoms of infection.  No signs or symptoms of stroke.  Patient does not meet criteria for involuntary commitment.  Blood work with normal CMP, normal CBC, UDS negative, UA negative for UTI.  Patient was evaluated by psychiatry.  History was gathered from her and her daughter was at bedside from both me and the psychiatry NP.  Patient was cleared for discharge by psychiatry.  No indication for medical admission.  Daughter was concerned about an elevated TSH that was done by the primary care doctor a month ago.  No free T4 was done at that time.  I am sending a repeat TSH and free T4.  Patient is on thyroid medication.  At this time they do not want to wait for the results and will check my chart in the morning and call the PCP for any adjustments necessary for the thyroid medication.  Family seems to be unhappy with the care the patient has been receiving by the primary care doctor.  I will refer them to geriatrics for a more thorough evaluation.  I discussed my standard  return precautions with both of them      Please note:  Patient was evaluated in Emergency Department today for the symptoms described in the history of present illness. Patient was evaluated in the context of the global COVID-19 pandemic, which necessitated consideration that the patient might be at risk for infection with the  SARS-CoV-2 virus that causes COVID-19. Institutional protocols and algorithms that pertain to the evaluation of patients at risk for COVID-19 are in a state of rapid change based on information released by regulatory bodies including the CDC and federal and state organizations. These policies and algorithms were followed during the patient's care in the ED.  Some ED evaluations and interventions may be delayed as a result of limited staffing during the pandemic.   ____________________________________________   FINAL CLINICAL IMPRESSION(S) / ED DIAGNOSES   Final diagnoses:  Visual hallucinations      NEW MEDICATIONS STARTED DURING THIS VISIT:  ED Discharge Orders    None       Note:  This document was prepared using Dragon voice recognition software and may include unintentional dictation errors.    Rudene Re, MD 12/30/19 [phone]

## 2019-12-30 NOTE — Discharge Instructions (Signed)
Do not make any changes to her medications before consulting her primary care doctor.  Call [facility] geriatrics department on Monday for an appointment.  Return to the emergency room if patient is having suicidal thoughts or homicidal thoughts.  You have been seen in the Emergency Department (ED)  today for a psychiatric complaint.  You have been evaluated by psychiatry and we believe you are safe to be discharged from the hospital.    Please return to the Emergency Department (ED)  immediately if you have ANY thoughts of hurting yourself or anyone else, so that we may help you.  Please avoid alcohol and drug use.  Follow up with your doctor and/or therapist as soon as possible regarding today's ED  visit.   You may call crisis hotline for [facility] at [phone].

## 2019-12-30 NOTE — BH Assessment (Signed)
Assessment Note  [name] is an 74 y.o. female. Per triage note: Patient brought in with daughter. Patient has a history of anxiety and bipolar. Patient has started having hallucinations. Patient was prescribed medication for her hallucinations but has been unable to get the medication so far due to insurance. The patient was given another medication to help with hallucinations in the meantime. The medication has helped but still hallucinating. Daughter reports that the patient has been sleeping 18 - 20 hours a day. Daughter is requesting in patient admission.   Pt presented with a meticulous appearance. Pt was alert and oriented x4. Carrie speech was coherent and at a loud volume. Motor behavior appears normal. Eye contact was good. Carrie mood is anxious; affect is congruent with mood. Patient was noted to have fair insight, evidenced by voices concerns about her mental status and memory issues. Pt also has good judgement as refrains from activities that may put her in harm's way, such as driving independently. Pt reported that she takes Klonopin nightly to ensure that she obtains a restful night's sleep. Carrie memory is impaired. The patient endorsed sporadic episodes of sadness in the context of losing her [name] and recent deaths of 2 other friends. Pt feels that she has good days and bad days. Pt expressed concerns about her tendency to frequently fall. Pt reports constant worrying and feelings of overwhelm. The pt. also referenced visual hallucinations as a current stressor. The patient denied SI, A/H, or HI. Pt reported that her sleep has changed and that she oscillates between not being able to stay asleep to sleeping most of the day.   Carrie daughter [name]) at bedside: Daughter reports that the pt.'s mental status and visual hallucinations have gotten worse since pt.'s [name] passed. Daughter explained that she is dissatisfied with the care the pt.'s psychiatrist provides and wanted a second  opinion. Daughter explained that the pt.'s memory and anxiety have not improved and may be getting worse. Daughter explained that the pt. sleeps excessively throughout the day.   Diagnosis: Adjustment disorder with mixed anxiety and depressed mood  Past Medical History:  Past Medical History:  Diagnosis Date  . Allergic rhinitis   . B12 deficiency   . Bipolar disorder ([place])   . Essential hypertension   . GERD (gastroesophageal reflux disease)   . Hashimoto's thyroiditis   . History of transient ischemic attack (TIA)    Or possibly migraine as well as right amaurosis fugax and ataxia - Dr. Erling Cruz  . Hyperlipidemia   . PSVT (paroxysmal supraventricular tachycardia) ([place])     Past Surgical History:  Procedure Laterality Date  . LAPAROSCOPIC CHOLECYSTECTOMY  2010  . THYROIDECTOMY  2009    Family History:  Family History  Problem Relation Age of Onset  . Stroke Mother   . Thyroid disease Mother   . Depression [name]   . Hypertension Sister   . Aneurysm Sister   . Diabetes Brother   . Heart attack Brother   . Hypertension Brother   . Heart failure Brother   . Thyroid disease Brother   . Stroke Maternal Grandmother   . Heart failure Maternal Grandmother   . Heart failure Maternal Grandfather   . Stroke Maternal Grandfather   . Heart failure Paternal Grandmother     Social History:  reports that she quit smoking about 36 years ago. Her smoking use included cigarettes. She has a 5.00 pack-year smoking history. She has never used smokeless tobacco. She reports that she does  not drink alcohol and does not use drugs.  Additional Social History:  Alcohol / Drug Use Pain Medications: See PTA Prescriptions: See PTA History of alcohol / drug use?: No history of alcohol / drug abuse  CIWA: CIWA-Ar BP: 122/69 Pulse Rate: 76 COWS:    Allergies:  Allergies  Allergen Reactions  . Penicillins     rash  . Amoxicillin-Pot Clavulanate Rash  . Atenolol Rash  . Sulfonamide  Derivatives Rash    Home Medications: (Not in a hospital admission)   OB/GYN Status:  No LMP recorded. Patient is postmenopausal.  General Assessment Data Location of Assessment: [facility] ED TTS Assessment: In system Is this a Tele or Face-to-Face Assessment?: Face-to-Face Is this an Initial Assessment or a Re-assessment for this encounter?: Initial Assessment Patient Accompanied by:: Other (Daughter) Language Other than English: No Living Arrangements: Other (Comment) What gender do you identify as?: Female Date Telepsych consult ordered in [facility]: 12/29/19 Time Telepsych consult ordered in [facility]: 2353 Marital status: Widowed [place] name: n/a Pregnancy Status: No Living Arrangements: Alone Can pt return to current living arrangement?: Yes Admission Status: Voluntary Is patient capable of signing voluntary admission?: Yes Referral Source: Self/Family/Friend Insurance type: [employer]  Medical Screening Exam ([place]) Medical Exam completed: Yes  Crisis Care Plan Living Arrangements: Alone Legal Guardian: Other: (Self) Name of Psychiatrist: Dr. Lin Landsman Name of Therapist: None noted  Education Status Is patient currently in school?: No Is the patient employed, unemployed or receiving disability?: Receiving disability income  Risk to self with the past 6 months Suicidal Ideation: No Has patient been a risk to self within the past 6 months prior to admission? : No Suicidal Intent: No Has patient had any suicidal intent within the past 6 months prior to admission? : No Is patient at risk for suicide?: No Suicidal Plan?: No Has patient had any suicidal plan within the past 6 months prior to admission? : No Access to Means: No What has been your use of drugs/alcohol within the last 12 months?: None Previous Attempts/Gestures: No How many times?: 0 Other Self Harm Risks: n/a Triggers for Past Attempts: None known Intentional Self Injurious Behavior:  None Family Suicide History: Yes ([name]) Recent stressful life event(s): Turmoil (Comment), Loss (Comment) ([name] expired Jan 2021) Persecutory voices/beliefs?: No Depression: Yes Depression Symptoms: Tearfulness, Despondent Substance abuse history and/or treatment for substance abuse?: No Suicide prevention information given to non-admitted patients: Not applicable  Risk to Others within the past 6 months Homicidal Ideation: No Does patient have any lifetime risk of violence toward others beyond the six months prior to admission? : No Thoughts of Harm to Others: No Current Homicidal Intent: No Current Homicidal Plan: No Access to Homicidal Means: No Identified Victim: n/a History of harm to others?: No Assessment of Violence: None Noted Violent Behavior Description: n/a Does patient have access to weapons?: No Criminal Charges Pending?: No Does patient have a court date: No Is patient on probation?: No  Psychosis Hallucinations: Visual Delusions: None noted  Mental Status Report Appearance/Hygiene: Meticulous Eye Contact: Good Motor Activity: Freedom of movement Speech: Logical/coherent, Loud Level of Consciousness: Alert Mood: Anxious, Despair Affect: Anxious, Appropriate to circumstance Anxiety Level: Moderate Thought Processes: Relevant, Coherent Judgement: Unimpaired Orientation: Person, Place, Time, Situation Obsessive Compulsive Thoughts/Behaviors: Severe  Cognitive Functioning Concentration: Normal Memory: Recent Impaired, Remote Impaired Is patient IDD: No Insight: Fair Impulse Control: Fair Appetite: Good Have you had any weight changes? : No Change Sleep: Increased Total Hours of Sleep:  12 Vegetative Symptoms: None  ADLScreening [facility] Assessment Services) Patient's cognitive ability adequate to safely complete daily activities?: Yes Patient able to express need for assistance with ADLs?: Yes Independently performs ADLs?: Yes  (appropriate for developmental age)  Prior Inpatient Therapy Prior Inpatient Therapy: No  Prior Outpatient Therapy Prior Outpatient Therapy: Yes Prior Therapy Facilty/Provider(s): Dr. Lin Landsman Reason for Treatment: Grief counseling Does patient have an ACCT team?: No Does patient have Intensive In-House Services?  : No Does patient have [facility] services? : No Does patient have P4CC services?: No  ADL Screening (condition at time of admission) Patient's cognitive ability adequate to safely complete daily activities?: Yes Is the patient deaf or have difficulty hearing?: No Does the patient have difficulty seeing, even when wearing glasses/contacts?: No Does the patient have difficulty concentrating, remembering, or making decisions?: No Patient able to express need for assistance with ADLs?: Yes Does the patient have difficulty dressing or bathing?: No Independently performs ADLs?: Yes (appropriate for developmental age) Does the patient have difficulty walking or climbing stairs?: No Weakness of Legs: None Weakness of Arms/Hands: None  Home Assistive Devices/Equipment Home Assistive Devices/Equipment: None  Therapy Consults (therapy consults require a physician order) PT Evaluation Needed: No OT Evalulation Needed: No SLP Evaluation Needed: No Abuse/Neglect Assessment (Assessment to be complete while patient is alone) Abuse/Neglect Assessment Can Be Completed: Yes Verbal Abuse: Yes, past (Comment) Exploitation of patient/patient's resources: Denies Self-Neglect: Denies Values / Beliefs Cultural Requests During Hospitalization: None Spiritual Requests During Hospitalization: None Consults Spiritual Care Consult Needed: No Transition of Care Team Consult Needed: No Advance Directives (For Healthcare) Does Patient Have a Medical Advance Directive?: No Would patient like information on creating a medical advance directive?: Yes (ED - Information included in AVS)           Disposition: Per psych NP Rashaun D., pt does not meet inpatient criteria and is recommended for discharge and immediate follow up with psychiatrist.   Disposition Initial Assessment Completed for this Encounter: Yes  On Site Evaluation by:   Reviewed with Physician:    Kathi Ludwig 12/30/2019 6:41 AM

## 2020-01-03 ENCOUNTER — Ambulatory Visit (HOSPITAL_COMMUNITY): Payer: PPO | Attending: Otolaryngology | Admitting: Physical Therapy

## 2020-01-03 ENCOUNTER — Encounter (HOSPITAL_COMMUNITY): Payer: Self-pay | Admitting: Physical Therapy

## 2020-01-03 ENCOUNTER — Other Ambulatory Visit: Payer: Self-pay

## 2020-01-03 VITALS — HR 72

## 2020-01-03 DIAGNOSIS — R42 Dizziness and giddiness: Secondary | ICD-10-CM | POA: Diagnosis not present

## 2020-01-03 DIAGNOSIS — R262 Difficulty in walking, not elsewhere classified: Secondary | ICD-10-CM | POA: Diagnosis not present

## 2020-01-03 DIAGNOSIS — F3181 Bipolar II disorder: Secondary | ICD-10-CM | POA: Diagnosis not present

## 2020-01-03 DIAGNOSIS — Z9181 History of falling: Secondary | ICD-10-CM | POA: Diagnosis not present

## 2020-01-03 NOTE — Therapy (Addendum)
[facility] [address] [place], [place], [address] Phone: [phone]   Fax:  [phone]  Physical Therapy Treatment and Discharge Note  Patient Details  Name: [name] MRN: [id number] Date of Birth: 07-07-1945 Referring Provider (PT): su teoh  PHYSICAL THERAPY DISCHARGE SUMMARY  Visits from Start of Care: 5  Current functional level related to goals / functional outcomes: Unable to assess due to unplanned discharge   Remaining deficits: Unable to assess due to unplanned discharge   Education / Equipment: Unable to assess due to unplanned discharge Plan: Patient agrees to discharge.  Patient goals were not met. Patient is being discharged due to not returning since the last visit.  ?????      3:08 PM, 02/28/20 Jerene Pitch, DPT Physical Therapy with [facility]  [phone] office  Encounter Date: 01/03/2020   PT End of Session - 01/03/20 1451    Visit Number 5    Number of Visits 12    Date for PT Re-Evaluation 01/28/20    Authorization Type [employer] no VL, no auth    Progress Note Due on Visit 10    PT Start Time 8921   patient 5 minutes late to session   PT Stop Time 1530    PT Time Calculation (min) 38 min    Equipment Utilized During Treatment Gait belt    Activity Tolerance Patient tolerated treatment well    Behavior During Therapy WFL for tasks assessed/performed           Past Medical History:  Diagnosis Date  . Allergic rhinitis   . B12 deficiency   . Bipolar disorder ([place])   . Essential hypertension   . GERD (gastroesophageal reflux disease)   . Hashimoto's thyroiditis   . History of transient ischemic attack (TIA)    Or possibly migraine as well as right amaurosis fugax and ataxia - Dr. Erling Cruz  . Hyperlipidemia   . PSVT (paroxysmal supraventricular tachycardia) ([place])     Past Surgical History:  Procedure Laterality Date  . LAPAROSCOPIC CHOLECYSTECTOMY   2010  . THYROIDECTOMY  2009    Vitals:              Subjective Assessment - 01/03/20 1451    Subjective Patient reports recent fall at home where she tripped over a chair onto a table, no major injuries reported other than soreness across her chest. Patient denies any additional symptoms or new onset of pain since incident.    Pertinent History blacks out, hx of falls    Patient Stated Goals states she would like to walk up and down an aisle and be straight and not be afraid she is going to fall    Currently in Pain? No/denies    Pain Score 0-No pain              [facility] PT Assessment - 01/03/20 0001      Assessment   Medical Diagnosis dizziness    Referring Provider (PT) su Benjamine Mola                         [facility] Adult PT Treatment/Exercise - 01/03/20 0001      Ambulation/Gait   Ambulation/Gait Yes    Ambulation Distance (Feet) 200 Feet    Assistive device Small based quad cane    Pre-Gait Activities Standing in parallel bars alternating steps with 4" step x 2 min to improve foot clearance x 2  trials      Neuro Re-ed    Neuro Re-ed Details  Static standing with LE elevated on 4" step 3x15 sec. same position with head turns 3x15 sec for added challenge. Side stepping x 2 min intervals for single limb loading response x 2 trials      Knee/Hip Exercises: Standing   Other Standing Knee Exercises --                  PT Education - 01/03/20 1534    Education Details Patient educated on benefits of more supportive AD RW vs NBQC. Educated on times and performance of HEP activities    Person(s) Educated Patient    Methods Explanation    Comprehension Verbalized understanding            PT Short Term Goals - 12/17/19 1617      PT SHORT TERM GOAL #1   Title Patient will report at least 25% improvement in overall symptoms and/or function to demonstrate improved functional mobility    Baseline 0% better    Time 3    Period Weeks    Status On-going     Target Date 11/14/19      PT SHORT TERM GOAL #2   Title Patient will be able to stand on either leg for atleast 8 seconds to demonstrate improved static balance    Time 3    Period Weeks    Status On-going    Target Date 11/14/19      PT SHORT TERM GOAL #3   Title Patient will be independent in self management strategies to improve quality of life and functional outcomes.    Baseline reports non-adherence - reports she lives by herself and fears she will fall    Time 3    Period Weeks    Status On-going    Target Date 11/14/19             PT Long Term Goals - 12/17/19 1618      PT LONG TERM GOAL #1   Title Patient will report at least 50% improvement in overall symptoms and/or function to demonstrate improved functional mobility    Baseline 0%    Time 6    Period Weeks    Status On-going      PT LONG TERM GOAL #2   Title Patient will be able to improve in DGI score by at least 4 points to demonstrate improved dynamic balance    Baseline --    Time 6    Period Weeks    Status On-going      PT LONG TERM GOAL #3   Title Patient will improve on FOTO score to meet predicted outcomes to demonstrate improved functional mobility.    Time 6    Period Weeks    Status On-going                 Plan - 01/03/20 1511    Clinical Impression Statement Patient returns to therapy and reports recent history of falling at home without injury reported.  Continues to exhibit gait and balance deficits with decreased weight acceptance LLE and using NBQC. Patient instructed in activities to improve dynamic balance and facilitate single limb support in order to reduce risk for falls. Patient reports sporadic symptoms of dizziness and LOB without specific triggers noted.  Patient reports no episodes of LOB today but was noted to experience episode of unsteadiness when walking toward waiting room at end of session.  It is noted that her gait stability improved with redirection to  conversation when walking vs her focus on unsteadiness. Patient would benefit from continued PT treatments to improve static and dynamic standing balance to reduce risk for falls and train/instruct in ambulation with least restrictive AD    Personal Factors and Comorbidities Comorbidity 1;Comorbidity 2;Comorbidity 3+    Comorbidities short of breath, right ear pain, left cataracts, hx of falls    Examination-Activity Limitations Bathing;Bed Mobility;Carry;Locomotion Level;Lift;Transfers;Stand;Stairs;Squat    Examination-Participation Restrictions Community Activity;Shop    Stability/Clinical Decision Making Evolving/Moderate complexity    Rehab Potential Fair    PT Frequency 2x / week    PT Duration 6 weeks    PT Treatment/Interventions ADLs/Self Care Home Management;Balance training;Therapeutic exercise;Therapeutic activities;Functional mobility training;Stair training;Gait training;DME Instruction;Neuromuscular re-education;Manual techniques    PT Next Visit Plan LE strengthening, balance exercises, have hands on her for balance exercises as her feet "wander" on her    PT Home Exercise Plan x1 gaze stabilization (VOR 1, saccades); Standing hip abd, STS    Consulted and Agree with Plan of Care Patient           Patient will benefit from skilled therapeutic intervention in order to improve the following deficits and impairments:  Abnormal gait,Decreased endurance,Decreased activity tolerance,Decreased balance,Difficulty walking,Decreased range of motion,Decreased safety awareness,Decreased knowledge of use of DME,Pain  Visit Diagnosis: Difficulty in walking, not elsewhere classified  History of falling  Dizziness and giddiness     Problem List Patient Active Problem List   Diagnosis Date Noted  . Dizziness, nonspecific 07/23/2014  . Postsurgical hypothyroidism 12/08/2012  . PSVT (paroxysmal supraventricular tachycardia) ([place]) 03/12/2011  . HYPERLIPIDEMIA 11/06/2009  . ESSENTIAL  HYPERTENSION, BENIGN 11/06/2009  . PALPITATIONS 11/06/2009    3:55 PM, 01/03/20 M. Sherlyn Lees, PT, DPT Physical Therapist- [place] Office Number: [phone]  [facility] [address] [place], [place], [address] Phone: [phone]   Fax:  [phone]  Name: [name] MRN: [id number] Date of Birth: 1945/01/28

## 2020-01-23 ENCOUNTER — Encounter (HOSPITAL_COMMUNITY): Payer: PPO | Admitting: Physical Therapy

## 2020-01-30 DIAGNOSIS — H6121 Impacted cerumen, right ear: Secondary | ICD-10-CM | POA: Diagnosis not present

## 2020-01-30 DIAGNOSIS — N3000 Acute cystitis without hematuria: Secondary | ICD-10-CM | POA: Diagnosis not present

## 2020-01-30 DIAGNOSIS — J019 Acute sinusitis, unspecified: Secondary | ICD-10-CM | POA: Diagnosis not present

## 2020-01-30 DIAGNOSIS — E7849 Other hyperlipidemia: Secondary | ICD-10-CM | POA: Diagnosis not present

## 2020-01-30 DIAGNOSIS — J069 Acute upper respiratory infection, unspecified: Secondary | ICD-10-CM | POA: Diagnosis not present

## 2020-01-30 DIAGNOSIS — F331 Major depressive disorder, recurrent, moderate: Secondary | ICD-10-CM | POA: Diagnosis not present

## 2020-01-30 DIAGNOSIS — R0981 Nasal congestion: Secondary | ICD-10-CM | POA: Diagnosis not present

## 2020-01-30 DIAGNOSIS — N390 Urinary tract infection, site not specified: Secondary | ICD-10-CM | POA: Diagnosis not present

## 2020-01-30 DIAGNOSIS — N3281 Overactive bladder: Secondary | ICD-10-CM | POA: Diagnosis not present

## 2020-01-30 DIAGNOSIS — Z1331 Encounter for screening for depression: Secondary | ICD-10-CM | POA: Diagnosis not present

## 2020-01-30 DIAGNOSIS — B373 Candidiasis of vulva and vagina: Secondary | ICD-10-CM | POA: Diagnosis not present

## 2020-01-30 DIAGNOSIS — R3915 Urgency of urination: Secondary | ICD-10-CM | POA: Diagnosis not present

## 2020-01-30 DIAGNOSIS — D649 Anemia, unspecified: Secondary | ICD-10-CM | POA: Diagnosis not present

## 2020-01-30 DIAGNOSIS — R350 Frequency of micturition: Secondary | ICD-10-CM | POA: Diagnosis not present

## 2020-01-30 DIAGNOSIS — R0602 Shortness of breath: Secondary | ICD-10-CM | POA: Diagnosis not present

## 2020-01-30 DIAGNOSIS — Z0000 Encounter for general adult medical examination without abnormal findings: Secondary | ICD-10-CM | POA: Diagnosis not present

## 2020-01-30 DIAGNOSIS — D509 Iron deficiency anemia, unspecified: Secondary | ICD-10-CM | POA: Diagnosis not present

## 2020-01-30 DIAGNOSIS — R1030 Lower abdominal pain, unspecified: Secondary | ICD-10-CM | POA: Diagnosis not present

## 2020-01-30 DIAGNOSIS — H9201 Otalgia, right ear: Secondary | ICD-10-CM | POA: Diagnosis not present

## 2020-01-31 DIAGNOSIS — M1712 Unilateral primary osteoarthritis, left knee: Secondary | ICD-10-CM | POA: Diagnosis not present

## 2020-01-31 DIAGNOSIS — M170 Bilateral primary osteoarthritis of knee: Secondary | ICD-10-CM | POA: Diagnosis not present

## 2020-01-31 DIAGNOSIS — M1711 Unilateral primary osteoarthritis, right knee: Secondary | ICD-10-CM | POA: Diagnosis not present

## 2020-02-13 ENCOUNTER — Institutional Professional Consult (permissible substitution): Payer: Medicare Other | Admitting: Neurology

## 2020-02-13 DIAGNOSIS — F3181 Bipolar II disorder: Secondary | ICD-10-CM | POA: Diagnosis not present

## 2020-03-13 DIAGNOSIS — H6121 Impacted cerumen, right ear: Secondary | ICD-10-CM | POA: Diagnosis not present

## 2020-03-13 DIAGNOSIS — F331 Major depressive disorder, recurrent, moderate: Secondary | ICD-10-CM | POA: Diagnosis not present

## 2020-03-13 DIAGNOSIS — Z1331 Encounter for screening for depression: Secondary | ICD-10-CM | POA: Diagnosis not present

## 2020-03-13 DIAGNOSIS — R350 Frequency of micturition: Secondary | ICD-10-CM | POA: Diagnosis not present

## 2020-03-13 DIAGNOSIS — Z0000 Encounter for general adult medical examination without abnormal findings: Secondary | ICD-10-CM | POA: Diagnosis not present

## 2020-03-13 DIAGNOSIS — N3000 Acute cystitis without hematuria: Secondary | ICD-10-CM | POA: Diagnosis not present

## 2020-03-13 DIAGNOSIS — R0981 Nasal congestion: Secondary | ICD-10-CM | POA: Diagnosis not present

## 2020-03-13 DIAGNOSIS — D649 Anemia, unspecified: Secondary | ICD-10-CM | POA: Diagnosis not present

## 2020-03-13 DIAGNOSIS — B373 Candidiasis of vulva and vagina: Secondary | ICD-10-CM | POA: Diagnosis not present

## 2020-03-13 DIAGNOSIS — J069 Acute upper respiratory infection, unspecified: Secondary | ICD-10-CM | POA: Diagnosis not present

## 2020-03-13 DIAGNOSIS — E7849 Other hyperlipidemia: Secondary | ICD-10-CM | POA: Diagnosis not present

## 2020-03-13 DIAGNOSIS — N390 Urinary tract infection, site not specified: Secondary | ICD-10-CM | POA: Diagnosis not present

## 2020-03-13 DIAGNOSIS — R0602 Shortness of breath: Secondary | ICD-10-CM | POA: Diagnosis not present

## 2020-03-13 DIAGNOSIS — J019 Acute sinusitis, unspecified: Secondary | ICD-10-CM | POA: Diagnosis not present

## 2020-03-28 NOTE — Progress Notes (Signed)
Cardiology Office Note  Date: 03/31/2020   ID: [name], DOB [birth year], MRN [id number]  PCP:  Celene Squibb, MD  Cardiologist:  Rozann Lesches, MD Electrophysiologist:  None   Chief Complaint  Patient presents with  . Cardiac follow-up    History of Present Illness: [name] is a 75 y.o. female last seen in October 2021 by Ms. Strader PA-C.  She presents today for routine visit.  She states that she has continued to experience exertional fatigue and breathlessness associated with a feeling of chest tightness.  No palpitations associated with the symptoms, no syncope.  Also reports frequent "sweats."  Lexiscan Myoview from October 2021 demonstrated no significant perfusion defects.  LVEF calculated at 43%, however was normal by echocardiogram in June 2021.  She was having similar exertional symptoms at the time of her testing.  I reviewed her medications which are outlined below.  She remains on Cardizem CD 240 mg daily which has provided good control of PSVT over time.  Cardiac risk factors include hypertension and hyperlipidemia, she has not undergone any prior anatomical cardiac testing.  Past Medical History:  Diagnosis Date  . Allergic rhinitis   . B12 deficiency   . Bipolar disorder ([place])   . Essential hypertension   . GERD (gastroesophageal reflux disease)   . Hashimoto's thyroiditis   . History of transient ischemic attack (TIA)    Or possibly migraine as well as right amaurosis fugax and ataxia - Dr. Erling Cruz  . Hyperlipidemia   . PSVT (paroxysmal supraventricular tachycardia) ([place])     Past Surgical History:  Procedure Laterality Date  . LAPAROSCOPIC CHOLECYSTECTOMY  2010  . THYROIDECTOMY  2009    Current Outpatient Medications  Medication Sig Dispense Refill  . acetaminophen (TYLENOL) 500 MG tablet 2 tabs    . Ascorbic Acid (VITAMIN C PO) Take 1 tablet by mouth daily.    Marland Kitchen buPROPion (WELLBUTRIN XL) 300 MG 24 hr tablet Take 300 mg by mouth daily.      . calcium carbonate (TUMS - DOSED IN MG ELEMENTAL CALCIUM) 500 MG chewable tablet Chew 4 tablets by mouth daily.    . clonazePAM (KLONOPIN) 1 MG tablet Take 1 mg by mouth at bedtime. May take up to 3 times daily as needed for anxiety    . dexlansoprazole (DEXILANT) 60 MG capsule 1 capsule    . diltiazem (CARDIZEM CD) 240 MG 24 hr capsule Take 1 capsule (240 mg total) by mouth daily. 90 capsule 3  . DULoxetine (CYMBALTA) 60 MG capsule Take 60 mg by mouth 2 (two) times daily.     Marland Kitchen losartan (COZAAR) 50 MG tablet Take 50 mg by mouth daily.    Marland Kitchen LYBALVI 5-10 MG TABS TAKE (1) TABLET BY MOUTHFAT BEDTIME.    . meloxicam (MOBIC) 15 MG tablet TAKE ONE TABLET BY MOUTHMONCE DAILY FOR 2 WEEKS; THEN AS NEEDED.    . Multiple Vitamin (MULTIVITAMIN) tablet Take 1 tablet by mouth daily.    . pantoprazole (PROTONIX) 40 MG tablet Take 40 mg by mouth every morning.    . Polysaccharide Iron Complex (IRON UP) 15 MG/0.5ML LIQD Take 15 mLs by mouth daily.    . Saccharomyces boulardii (FLORASTOR PO) Take 1 tablet by mouth in the morning and at bedtime.    Marland Kitchen SYNTHROID 112 MCG tablet Take 112 mcg by mouth every morning.    . vitamin B-12 (CYANOCOBALAMIN) 500 MCG tablet Take 500 mcg by mouth daily.    Marland Kitchen aspirin  EC 81 MG tablet Take 81 mg by mouth daily. (Patient not taking: Reported on 03/31/2020)     No current facility-administered medications for this visit.   Allergies:  Penicillins, Amoxicillin-pot clavulanate, Atenolol, and Sulfonamide derivatives   ROS: No palpitations or syncope.  Physical Exam: VS:  BP 124/68   Pulse (!) 50   Ht 5\' 2"  (1.575 m)   Wt 196 lb (88.9 kg)   SpO2 98%   BMI 35.85 kg/m , BMI Body mass index is 35.85 kg/m.  Wt Readings from Last 3 Encounters:  03/31/20 196 lb (88.9 kg)  12/29/19 185 lb (83.9 kg)  11/08/19 178 lb 9.6 oz (81 kg)    General: Patient appears comfortable at rest. HEENT: Conjunctiva and lids normal, wearing a mask. Neck: Supple, no elevated JVP or carotid  bruits, no thyromegaly. Lungs: Clear to auscultation, nonlabored breathing at rest. Cardiac: Regular rate and rhythm, no S3 or significant systolic murmur, no pericardial rub. Abdomen: Soft, nontender, no hepatomegaly, bowel sounds present, no guarding or rebound. Extremities: No pitting edema.  ECG:  An ECG dated 11/05/2019 was personally reviewed today and demonstrated:  Sinus rhythm with right bundle branch block.  Recent Labwork: 11/05/2019: B Natriuretic Peptide 51.1 12/29/2019: ALT 19; AST 19; BUN 10; Creatinine, Ser 0.75; Hemoglobin 12.9; Platelets 386; Potassium 4.6; Sodium 138; TSH 3.657   Other Studies Reviewed Today:  Echocardiogram 07/02/2019: 1. Left ventricular ejection fraction, by estimation, is 65 to 70%. The  left ventricle has normal function. The left ventricle has no regional  wall motion abnormalities. There is mild left ventricular hypertrophy.  Left ventricular diastolic parameters  are indeterminate.  2. Right ventricular systolic function is normal. The right ventricular  size is normal. Tricuspid regurgitation signal is inadequate for assessing  PA pressure.  3. Left atrial size was mildly dilated.  4. The mitral valve is grossly normal. Trivial mitral valve  regurgitation.  5. The aortic valve is tricuspid. Aortic valve regurgitation is not  visualized.  6. The inferior vena cava is normal in size with greater than 50%  respiratory variability, suggesting right atrial pressure of 3 mmHg.   Lexiscan Myoview 11/15/2019:  There was no ST segment deviation noted during stress.  The study is normal. There are no perfusion defects consistent with prior infarct or current ischemia.  The left ventricular ejection fraction is mildly decreased (43%).  This is an intermediate risk study. Risk is based on decreased LVEF, there is no current myocardium at jeopardy. Consider correlating LVEF with echo.  Assessment and Plan:  1.  Continued exertional fatigue  and breathlessness associated with chest tightness, no palpitations at the time to suggest PSVT contributing.  Myoview from October 2021 did not reveal any significant perfusion defects, reduced LVEF did not correlate with echocardiography.  Symptoms are still concerning for possible exertional angina.  Cardiac risk factors include hyperlipidemia and hypertension.  Plan is to proceed with a cardiac CTA for anatomical cardiac assessment and calcium score.  2.  PSVT, this has been well controlled on Cardizem CD over time.  3.  Essential hypertension, on losartan and Cardizem CD.  Systolic blood pressure 672 today.   Medication Adjustments/Labs and Tests Ordered: Current medicines are reviewed at length with the patient today.  Concerns regarding medicines are outlined above.   Tests Ordered: Orders Placed This Encounter  Procedures  . CT CORONARY MORPH W/CTA COR W/SCORE W/CA W/CM &/OR WO/CM  . CT CORONARY FRACTIONAL FLOW RESERVE DATA PREP  . CT CORONARY FRACTIONAL  FLOW RESERVE FLUID ANALYSIS  . Basic metabolic panel    Medication Changes: No orders of the defined types were placed in this encounter.   Disposition:  Follow up 6 months, sooner if needed.  Signed, Satira Sark, MD, [facility] 03/31/2020 1:37 PM    [facility] at [facility] [address] [place], [place] [address] Phone: [phone]; Fax: [phone]

## 2020-03-31 ENCOUNTER — Encounter: Payer: Self-pay | Admitting: Cardiology

## 2020-03-31 ENCOUNTER — Other Ambulatory Visit: Payer: Self-pay

## 2020-03-31 ENCOUNTER — Ambulatory Visit: Payer: PPO | Admitting: Cardiology

## 2020-03-31 VITALS — BP 124/68 | HR 50 | Ht 62.0 in | Wt 196.0 lb

## 2020-03-31 DIAGNOSIS — I471 Supraventricular tachycardia, unspecified: Secondary | ICD-10-CM

## 2020-03-31 DIAGNOSIS — I208 Other forms of angina pectoris: Secondary | ICD-10-CM | POA: Diagnosis not present

## 2020-03-31 DIAGNOSIS — E782 Mixed hyperlipidemia: Secondary | ICD-10-CM | POA: Diagnosis not present

## 2020-03-31 DIAGNOSIS — R079 Chest pain, unspecified: Secondary | ICD-10-CM

## 2020-03-31 DIAGNOSIS — I10 Essential (primary) hypertension: Secondary | ICD-10-CM | POA: Diagnosis not present

## 2020-03-31 DIAGNOSIS — I2089 Other forms of angina pectoris: Secondary | ICD-10-CM

## 2020-03-31 NOTE — Patient Instructions (Signed)
Medication Instructions:  Your physician recommends that you continue on your current medications as directed. Please refer to the Current Medication list given to you today.  *If you need a refill on your cardiac medications before your next appointment, please call your pharmacy*   Lab Work: Your physician recommends that you return for lab work in: 1 Week before [place] ( BMET)   If you have labs (blood work) drawn today and your tests are completely normal, you will receive your results only by: Marland Kitchen MyChart Message (if you have MyChart) OR . A paper copy in the mail If you have any lab test that is abnormal or we need to change your treatment, we will call you to review the results.   Testing/Procedures: Cardiac CTA    Follow-Up: At [facility], you and your health needs are our priority.  As part of our continuing mission to provide you with exceptional heart care, we have created designated Provider Care Teams.  These Care Teams include your primary Cardiologist (physician) and Advanced Practice Providers (APPs -  Physician Assistants and Nurse Practitioners) who all work together to provide you with the care you need, when you need it.  We recommend signing up for the patient portal called "MyChart".  Sign up information is provided on this After Visit Summary.  MyChart is used to connect with patients for Virtual Visits (Telemedicine).  Patients are able to view lab/test results, encounter notes, upcoming appointments, etc.  Non-urgent messages can be sent to your provider as well.   To learn more about what you can do with MyChart, go to [email or web address].    Your next appointment:   6 month(s)  The format for your next appointment:   In Person  Provider:   You may see Rozann Lesches, MD or one of the following Advanced Practice Providers on your designated Care Team:    Bernerd Pho, PA-C   Ermalinda Barrios, PA-C     Other Instructions Thank you for  choosing [facility]!  Your cardiac CT will be scheduled at one of the below locations:   [facility] [address] [place], [place] [address] [phone]  [place] [address] [place], [place] [address] [phone]  If scheduled at [facility], please arrive at the [facility] main entrance (entrance A) of [facility] [place] [facility] 30 minutes prior to test start time. Proceed to the [facility] Radiology Department (first floor) to check-in and test prep.  If scheduled at [facility], please arrive 15 mins early for check-in and test prep.  Please follow these instructions carefully (unless otherwise directed):  On the Night Before the Test: . Be sure to Drink plenty of water. . Do not consume any caffeinated/decaffeinated beverages or chocolate 12 hours prior to your test. . Do not take any antihistamines 12 hours prior to your test.  On the Day of the Test: . Drink plenty of water until 1 hour prior to the test. . Do not eat any food 4 hours prior to the test. . You may take your regular medications prior to the test.  . Take metoprolol (Lopressor) two hours prior to test. . HOLD Furosemide/Hydrochlorothiazide morning of the test. . FEMALES- please wear underwire-free bra if available   *For Clinical Staff only. Please instruct patient the following:*        -Drink plenty of water       -Hold Furosemide/hydrochlorothiazide  morning of the test       -Take metoprolol (Lopressor) 2 hours prior to test (if applicable).                  -If HR is less than 55 BPM- No Lopressor                -IF HR is greater than 55 BPM and patient is less than or equal to 21 yrs old Lopressor 100mg  x1.                -If HR is greater than 55 BPM and patient is greater than 50 yrs old Lopressor 50 mg x1.     Do not give Lopressor to patients with an allergy to lopressor or anyone  with asthma or active COPD symptoms (currently taking steroids).       After the Test: . Drink plenty of water. . After receiving IV contrast, you may experience a mild flushed feeling. This is normal. . On occasion, you may experience a mild rash up to 24 hours after the test. This is not dangerous. If this occurs, you can take Benadryl 25 mg and increase your fluid intake. . If you experience trouble breathing, this can be serious. If it is severe call 911 IMMEDIATELY. If it is mild, please call our office. . If you take any of these medications: Glipizide/Metformin, Avandament, Glucavance, please do not take 48 hours after completing test unless otherwise instructed.   Once we have confirmed authorization from your insurance company, we will call you to set up a date and time for your test. Based on how quickly your insurance processes prior authorizations requests, please allow up to 4 weeks to be contacted for scheduling your Cardiac CT appointment. Be advised that routine Cardiac CT appointments could be scheduled as many as 8 weeks after your provider has ordered it.  For non-scheduling related questions, please contact the cardiac imaging nurse navigator should you have any questions/concerns: Marchia Bond, Cardiac Imaging Nurse Navigator Gordy Clement, Cardiac Imaging Nurse Navigator [facility] Direct Office Dial: [phone]   For scheduling needs, including cancellations and rescheduling, please call [place], [phone].

## 2020-04-07 ENCOUNTER — Encounter: Payer: Self-pay | Admitting: Nurse Practitioner

## 2020-04-07 ENCOUNTER — Other Ambulatory Visit: Payer: Self-pay

## 2020-04-07 ENCOUNTER — Ambulatory Visit (INDEPENDENT_AMBULATORY_CARE_PROVIDER_SITE_OTHER): Payer: PPO | Admitting: Nurse Practitioner

## 2020-04-07 VITALS — BP 167/78 | HR 94 | Temp 98.3°F | Resp 20 | Ht 62.0 in | Wt 194.0 lb

## 2020-04-07 DIAGNOSIS — K219 Gastro-esophageal reflux disease without esophagitis: Secondary | ICD-10-CM | POA: Diagnosis not present

## 2020-04-07 DIAGNOSIS — E785 Hyperlipidemia, unspecified: Secondary | ICD-10-CM | POA: Diagnosis not present

## 2020-04-07 DIAGNOSIS — I10 Essential (primary) hypertension: Secondary | ICD-10-CM

## 2020-04-07 DIAGNOSIS — F319 Bipolar disorder, unspecified: Secondary | ICD-10-CM | POA: Diagnosis not present

## 2020-04-07 DIAGNOSIS — R5383 Other fatigue: Secondary | ICD-10-CM | POA: Insufficient documentation

## 2020-04-07 DIAGNOSIS — F39 Unspecified mood [affective] disorder: Secondary | ICD-10-CM | POA: Insufficient documentation

## 2020-04-07 DIAGNOSIS — D519 Vitamin B12 deficiency anemia, unspecified: Secondary | ICD-10-CM | POA: Insufficient documentation

## 2020-04-07 DIAGNOSIS — Z7689 Persons encountering health services in other specified circumstances: Secondary | ICD-10-CM | POA: Insufficient documentation

## 2020-04-07 DIAGNOSIS — E890 Postprocedural hypothyroidism: Secondary | ICD-10-CM

## 2020-04-07 DIAGNOSIS — F419 Anxiety disorder, unspecified: Secondary | ICD-10-CM

## 2020-04-07 DIAGNOSIS — R350 Frequency of micturition: Secondary | ICD-10-CM

## 2020-04-07 DIAGNOSIS — D509 Iron deficiency anemia, unspecified: Secondary | ICD-10-CM

## 2020-04-07 LAB — POCT URINALYSIS DIPSTICK
Bilirubin, UA: NEGATIVE
Blood, UA: NEGATIVE
Glucose, UA: NEGATIVE
Ketones, UA: NEGATIVE
Nitrite, UA: NEGATIVE
Protein, UA: NEGATIVE
Spec Grav, UA: 1.015 (ref 1.010–1.025)
Urobilinogen, UA: 0.2 E.U./dL
pH, UA: 7 (ref 5.0–8.0)

## 2020-04-07 MED ORDER — LOSARTAN POTASSIUM 50 MG PO TABS: 50.0000 mg | ORAL_TABLET | Freq: Two times a day (BID) | ORAL | 1 refills | Status: DC

## 2020-04-07 MED ORDER — CIPROFLOXACIN HCL 500 MG PO TABS: 500.0000 mg | ORAL_TABLET | Freq: Two times a day (BID) | ORAL | 0 refills | Status: DC

## 2020-04-07 NOTE — Assessment & Plan Note (Signed)
-  checking lipids with next set of labs

## 2020-04-07 NOTE — Assessment & Plan Note (Signed)
-  transferring from Dr. Juel Burrow -obtain medical records

## 2020-04-07 NOTE — Assessment & Plan Note (Signed)
-  BP elevated today  -INCREASE losartan to 50 mg BID

## 2020-04-07 NOTE — Assessment & Plan Note (Signed)
-  has taken dexilant and protonix in the past -takes tums PRN -takes probiotic

## 2020-04-07 NOTE — Progress Notes (Signed)
We sent in cipro. Will call when results from urine culture come back.

## 2020-04-07 NOTE — Assessment & Plan Note (Signed)
-  has hx of iron deficiency as well -took liquid iron supplement in the past -will check iron panel with labs -states she has fatigue

## 2020-04-07 NOTE — Assessment & Plan Note (Signed)
-  checking labs today -she has hx of iron deficiency and b-12 deficiency anemias -also has hypothyroidism

## 2020-04-07 NOTE — Progress Notes (Signed)
New Patient Office Visit  Subjective:  Patient ID: [name], female    DOB: 12-30-1945  Age: 75 y.o. MRN: [id number]  CC:  Chief Complaint  Patient presents with  . New Patient (Initial Visit)    HPI [name] presents for new patient visit. Transferring care from Wende Neighbors, MD. Last physical was over a year ago. Last labs were drawn on 12/29/19.  She has urinary frequency and states that she has right flank pain today. She is also having fatigue. She took B12 injections in the past, and her last injection was over 2 months ago. Her husband recently died, and she got off her schedule of getting shots while taking care of him.  She sees [name] for GYN exams. She is followed by Dr. Reece Levy for bipolar. Has had psychosis episodes in the past.  Past Medical History:  Diagnosis Date  . Allergic rhinitis   . B12 deficiency   . Bipolar disorder ([place])   . Essential hypertension   . GERD (gastroesophageal reflux disease)   . Hashimoto's thyroiditis   . History of transient ischemic attack (TIA)    Or possibly migraine as well as right amaurosis fugax and ataxia - Dr. Erling Cruz  . Hyperlipidemia   . PSVT (paroxysmal supraventricular tachycardia) ([place])     Past Surgical History:  Procedure Laterality Date  . LAPAROSCOPIC CHOLECYSTECTOMY  2010  . THYROIDECTOMY  2009    Family History  Problem Relation Age of Onset  . Stroke Mother   . Thyroid disease Mother   . Depression Father   . Hypertension Sister   . Aneurysm Sister   . Diabetes Brother   . Heart attack Brother   . Hypertension Brother   . Heart failure Brother   . Thyroid disease Brother   . Stroke Maternal Grandmother   . Heart failure Maternal Grandmother   . Heart failure Maternal Grandfather   . Stroke Maternal Grandfather   . Heart failure Paternal Grandmother     Social History   Socioeconomic History  . Marital status: Widowed    Spouse name: Not on file  . Number of children: 2  .  Years of education: Not on file  . Highest education level: Not on file  Occupational History  . Occupation: homemaker    Employer: RETIRED  Tobacco Use  . Smoking status: Former Smoker    Packs/day: 0.50    Years: 10.00    Pack years: 5.00    Types: Cigarettes    Quit date: 01/26/1983    Years since quitting: 37.2  . Smokeless tobacco: Never Used  . Tobacco comment: quit 30 years ago  Vaping Use  . Vaping Use: Never used  Substance and Sexual Activity  . Alcohol use: No    Alcohol/week: 0.0 standard drinks  . Drug use: No  . Sexual activity: Not Currently  Other Topics Concern  . Not on file  Social History Narrative   Regular exercise: walk when able   Caffeine use: occasionally   Social Determinants of Health   Financial Resource Strain: Not on file  Food Insecurity: Not on file  Transportation Needs: Not on file  Physical Activity: Not on file  Stress: Not on file  Social Connections: Not on file  Intimate Partner Violence: Not on file    ROS Review of Systems  Constitutional: Positive for fatigue.  Respiratory: Negative.   Cardiovascular: Negative.   Genitourinary: Positive for dysuria and frequency.  Psychiatric/Behavioral: Negative.  Objective:   Today's Vitals: BP (!) 167/78   Pulse 94   Temp 98.3 F (36.8 C)   Resp 20   Ht '5\' 2"'  (1.575 m)   Wt 194 lb (88 kg)   SpO2 95%   BMI 35.48 kg/m   Physical Exam Constitutional:      Appearance: Normal appearance.  Cardiovascular:     Rate and Rhythm: Normal rate and regular rhythm.     Pulses: Normal pulses.     Heart sounds: Normal heart sounds.  Pulmonary:     Effort: Pulmonary effort is normal.     Breath sounds: Normal breath sounds.  Abdominal:     Tenderness: There is no right CVA tenderness or left CVA tenderness.  Neurological:     Mental Status: She is alert.  Psychiatric:        Mood and Affect: Mood normal.        Behavior: Behavior normal.        Thought Content: Thought content  normal.        Judgment: Judgment normal.     Assessment & Plan:   Problem List Items Addressed This Visit      Cardiovascular and Mediastinum   ESSENTIAL HYPERTENSION, BENIGN    -BP elevated today  -INCREASE losartan to 50 mg BID      Relevant Medications   losartan (COZAAR) 50 MG tablet   Other Relevant Orders   CBC with Differential/Platelet   CMP14+EGFR     Digestive   Esophageal reflux    -has taken dexilant and protonix in the past -takes tums PRN -takes probiotic        Endocrine   Postsurgical hypothyroidism    -checking thyroid panel with labs        Other   Hyperlipidemia    -checking lipids with next set of labs      Relevant Medications   losartan (COZAAR) 50 MG tablet   Other Relevant Orders   CBC with Differential/Platelet   CMP14+EGFR   Lipid Panel With LDL/HDL Ratio   Encounter to establish care - Primary    -transferring from Dr. Juel Burrow -obtain medical records      Relevant Orders   CBC with Differential/Platelet   CMP14+EGFR   Lipid Panel With LDL/HDL Ratio   TSH + free T4   Urine Culture   B12   Iron, TIBC and Ferritin Panel   B12 deficiency anemia    -hasn't had b12 injection in over 2 months -will check B12 with next set of labs -may initiate injections again if low      Relevant Orders   B12   Urinary frequency    -had 2 UTIs recently -she states last was treated with cipro -U/A today showed trace leuks -will get urine culture -Rx cipro       Relevant Orders   Urine Culture   Bipolar 1 disorder ([place])    -followed by Dr. Reece Levy for psychiatry -states she has had psychotic episodes in the past -takes olanzapine-samidorphan      Anxiety    -followed by Dr. Reece Levy -takes klonopin and wellbutrin      Anemia, iron deficiency    -has hx of iron deficiency as well -took liquid iron supplement in the past -will check iron panel with labs -states she has fatigue      Relevant Orders   Iron, TIBC and Ferritin  Panel   Fatigue    -checking labs today -she has hx of iron  deficiency and b-12 deficiency anemias -also has hypothyroidism       Relevant Orders   TSH + free T4   Urine Culture   B12   Iron, TIBC and Ferritin Panel      Outpatient Encounter Medications as of 04/07/2020  Medication Sig  . acetaminophen (TYLENOL) 500 MG tablet 2 tabs  . Ascorbic Acid (VITAMIN C PO) Take 1 tablet by mouth daily.  Marland Kitchen aspirin EC 81 MG tablet Take 81 mg by mouth daily.  Marland Kitchen buPROPion (WELLBUTRIN XL) 300 MG 24 hr tablet Take 300 mg by mouth daily.   . calcium carbonate (TUMS - DOSED IN MG ELEMENTAL CALCIUM) 500 MG chewable tablet Chew 4 tablets by mouth daily.  . ciprofloxacin (CIPRO) 500 MG tablet Take 1 tablet (500 mg total) by mouth 2 (two) times daily.  . clonazePAM (KLONOPIN) 1 MG tablet Take 1 mg by mouth at bedtime. May take up to 3 times daily as needed for anxiety  . dexlansoprazole (DEXILANT) 60 MG capsule 1 capsule  . DULoxetine (CYMBALTA) 60 MG capsule Take 60 mg by mouth 2 (two) times daily.   Marland Kitchen LYBALVI 5-10 MG TABS TAKE (1) TABLET BY MOUTHFAT BEDTIME.  . meloxicam (MOBIC) 15 MG tablet TAKE ONE TABLET BY MOUTHMONCE DAILY FOR 2 WEEKS; THEN AS NEEDED.  . Multiple Vitamin (MULTIVITAMIN) tablet Take 1 tablet by mouth daily.  . pantoprazole (PROTONIX) 40 MG tablet Take 40 mg by mouth every morning.  . Polysaccharide Iron Complex (IRON UP) 15 MG/0.5ML LIQD Take 15 mLs by mouth daily.  . Saccharomyces boulardii (FLORASTOR PO) Take 1 tablet by mouth in the morning and at bedtime.  Marland Kitchen SYNTHROID 112 MCG tablet Take 112 mcg by mouth every morning.  . vitamin B-12 (CYANOCOBALAMIN) 500 MCG tablet Take 500 mcg by mouth daily.  . [DISCONTINUED] losartan (COZAAR) 50 MG tablet Take 50 mg by mouth in the morning and at bedtime.  Marland Kitchen diltiazem (CARDIZEM CD) 240 MG 24 hr capsule Take 1 capsule (240 mg total) by mouth daily.  Marland Kitchen losartan (COZAAR) 50 MG tablet Take 1 tablet (50 mg total) by mouth in the morning and at  bedtime.   No facility-administered encounter medications on file as of 04/07/2020.    Follow-up: Return in about 1 week (around 04/14/2020) for Physical Exam.   Noreene Larsson, NP

## 2020-04-07 NOTE — Assessment & Plan Note (Addendum)
-  had 2 UTIs recently -she states last was treated with cipro -U/A today showed trace leuks -will get urine culture -Rx cipro

## 2020-04-07 NOTE — Patient Instructions (Addendum)
I increased her losartan to 50 mg twice per day, so she can just take the medication that she has once in the morning and once in the evening, about 12 hours apart.  We will draw fasting labs 2-3 days prior to her next appointment.

## 2020-04-07 NOTE — Addendum Note (Signed)
Addended by: Lonn Georgia on: 04/07/2020 03:26 PM   Modules accepted: Orders

## 2020-04-07 NOTE — Assessment & Plan Note (Addendum)
-  followed by Dr. Reece Levy for psychiatry -states she has had psychotic episodes in the past -takes olanzapine-samidorphan

## 2020-04-07 NOTE — Assessment & Plan Note (Signed)
-  hasn't had b12 injection in over 2 months -will check B12 with next set of labs -may initiate injections again if low

## 2020-04-07 NOTE — Assessment & Plan Note (Signed)
-  followed by Dr. Reece Levy -takes klonopin and wellbutrin

## 2020-04-07 NOTE — Assessment & Plan Note (Signed)
-  checking thyroid panel with labs

## 2020-04-09 DIAGNOSIS — F3181 Bipolar II disorder: Secondary | ICD-10-CM | POA: Diagnosis not present

## 2020-04-09 LAB — URINE CULTURE

## 2020-04-09 NOTE — Progress Notes (Signed)
Urine culture didn't grow any pathogens, so no UTI. She can stop antibiotics.

## 2020-04-10 ENCOUNTER — Other Ambulatory Visit (HOSPITAL_COMMUNITY)
Admission: RE | Admit: 2020-04-10 | Discharge: 2020-04-10 | Disposition: A | Discharge status: Home / Self Care | Payer: PPO | Source: Ambulatory Visit | Attending: Cardiology | Admitting: Cardiology

## 2020-04-10 ENCOUNTER — Telehealth: Payer: Self-pay | Admitting: *Deleted

## 2020-04-10 DIAGNOSIS — I208 Other forms of angina pectoris: Secondary | ICD-10-CM

## 2020-04-10 DIAGNOSIS — I471 Supraventricular tachycardia: Secondary | ICD-10-CM | POA: Diagnosis not present

## 2020-04-10 LAB — BASIC METABOLIC PANEL
Anion gap: 9 (ref 5–15)
BUN: 11 mg/dL (ref 8–23)
CO2: 27 mmol/L (ref 22–32)
Calcium: 9.4 mg/dL (ref 8.9–10.3)
Chloride: 96 mmol/L — ABNORMAL LOW (ref 98–111)
Creatinine, Ser: 0.7 mg/dL (ref 0.44–1.00)
GFR, Estimated: >60 mL/min (ref >60)
Glucose, Bld: 98 mg/dL (ref 70–99)
Potassium: 4.8 mmol/L (ref 3.5–5.1)
Sodium: 132 mmol/L — ABNORMAL LOW (ref 135–145)

## 2020-04-10 NOTE — Telephone Encounter (Signed)
Patient informed. Copy sent to PCP °

## 2020-04-10 NOTE — Telephone Encounter (Signed)
-----   Message from Satira Sark, MD sent at 04/10/2020 11:35 AM EDT ----- Results reviewed.  Renal function and potassium are normal.

## 2020-04-11 DIAGNOSIS — E785 Hyperlipidemia, unspecified: Secondary | ICD-10-CM | POA: Diagnosis not present

## 2020-04-11 DIAGNOSIS — R5383 Other fatigue: Secondary | ICD-10-CM | POA: Diagnosis not present

## 2020-04-11 DIAGNOSIS — D509 Iron deficiency anemia, unspecified: Secondary | ICD-10-CM | POA: Diagnosis not present

## 2020-04-11 DIAGNOSIS — I10 Essential (primary) hypertension: Secondary | ICD-10-CM | POA: Diagnosis not present

## 2020-04-11 DIAGNOSIS — D519 Vitamin B12 deficiency anemia, unspecified: Secondary | ICD-10-CM | POA: Diagnosis not present

## 2020-04-11 DIAGNOSIS — Z7689 Persons encountering health services in other specified circumstances: Secondary | ICD-10-CM | POA: Diagnosis not present

## 2020-04-12 LAB — CMP14+EGFR
ALT: 19 IU/L (ref 0–32)
AST: 20 IU/L (ref 0–40)
Albumin/Globulin Ratio: 1.8 (ref 1.2–2.2)
Albumin: 4.6 g/dL (ref 3.7–4.7)
Alkaline Phosphatase: 103 IU/L (ref 44–121)
BUN/Creatinine Ratio: 11 — ABNORMAL LOW (ref 12–28)
BUN: 8 mg/dL (ref 8–27)
Bilirubin Total: 0.4 mg/dL (ref 0.0–1.2)
CO2: 22 mmol/L (ref 20–29)
Calcium: 9.8 mg/dL (ref 8.7–10.3)
Chloride: 100 mmol/L (ref 96–106)
Creatinine, Ser: 0.7 mg/dL (ref 0.57–1.00)
Globulin, Total: 2.6 g/dL (ref 1.5–4.5)
Glucose: 112 mg/dL — ABNORMAL HIGH (ref 65–99)
Potassium: 4.7 mmol/L (ref 3.5–5.2)
Sodium: 138 mmol/L (ref 134–144)
Total Protein: 7.2 g/dL (ref 6.0–8.5)
eGFR: 91 mL/min/{1.73_m2} (ref >59)

## 2020-04-12 LAB — CBC WITH DIFFERENTIAL/PLATELET
Basophils Absolute: 0 10*3/uL (ref 0.0–0.2)
Basos: 1 %
EOS (ABSOLUTE): 0.2 10*3/uL (ref 0.0–0.4)
Eos: 3 %
Hematocrit: 42 % (ref 34.0–46.6)
Hemoglobin: 14.5 g/dL (ref 11.1–15.9)
Immature Grans (Abs): 0 10*3/uL (ref 0.0–0.1)
Immature Granulocytes: 0 %
Lymphocytes Absolute: 1.4 10*3/uL (ref 0.7–3.1)
Lymphs: 16 %
MCH: 32.4 pg (ref 26.6–33.0)
MCHC: 34.5 g/dL (ref 31.5–35.7)
MCV: 94 fL (ref 79–97)
Monocytes Absolute: 0.9 10*3/uL (ref 0.1–0.9)
Monocytes: 11 %
Neutrophils Absolute: 6 10*3/uL (ref 1.4–7.0)
Neutrophils: 69 %
Platelets: 477 10*3/uL — ABNORMAL HIGH (ref 150–450)
RBC: 4.48 x10E6/uL (ref 3.77–5.28)
RDW: 12.4 % (ref 11.7–15.4)
WBC: 8.7 10*3/uL (ref 3.4–10.8)

## 2020-04-12 LAB — LIPID PANEL WITH LDL/HDL RATIO
Cholesterol, Total: 262 mg/dL — ABNORMAL HIGH (ref 100–199)
HDL: 83 mg/dL (ref >39)
LDL Chol Calc (NIH): 165 mg/dL — ABNORMAL HIGH (ref 0–99)
LDL/HDL Ratio: 2 ratio (ref 0.0–3.2)
Triglycerides: 82 mg/dL (ref 0–149)
VLDL Cholesterol Cal: 14 mg/dL (ref 5–40)

## 2020-04-12 LAB — IRON,TIBC AND FERRITIN PANEL
Ferritin: 51 ng/mL (ref 15–150)
Iron Saturation: 27 % (ref 15–55)
Iron: 103 ug/dL (ref 27–139)
Total Iron Binding Capacity: 376 ug/dL (ref 250–450)
UIBC: 273 ug/dL (ref 118–369)

## 2020-04-12 LAB — TSH+FREE T4
Free T4: 1.53 ng/dL (ref 0.82–1.77)
TSH: 2.6 u[IU]/mL (ref 0.450–4.500)

## 2020-04-12 LAB — VITAMIN B12: Vitamin B-12: 716 pg/mL (ref 232–1245)

## 2020-04-14 ENCOUNTER — Telehealth (HOSPITAL_COMMUNITY): Payer: Self-pay | Admitting: Emergency Medicine

## 2020-04-14 ENCOUNTER — Encounter (HOSPITAL_COMMUNITY): Payer: Self-pay

## 2020-04-14 ENCOUNTER — Telehealth: Payer: Self-pay

## 2020-04-14 NOTE — Telephone Encounter (Signed)
Reaching out to patient to offer assistance regarding upcoming cardiac imaging study; pt verbalizes understanding of appt date/time, parking situation and where to check in, pre-test NPO status and medications ordered, and verified current allergies; name and call back number provided for further questions should they arise Carrie Bond RN [facility] [phone] office [phone] cell  Will send mychart per request

## 2020-04-14 NOTE — Telephone Encounter (Signed)
Patient calling wanting to know results of labs from last week ph# [phone]

## 2020-04-15 NOTE — Telephone Encounter (Signed)
Pt informed

## 2020-04-16 ENCOUNTER — Encounter (HOSPITAL_COMMUNITY): Payer: Self-pay

## 2020-04-16 ENCOUNTER — Other Ambulatory Visit: Payer: Self-pay

## 2020-04-16 ENCOUNTER — Ambulatory Visit (HOSPITAL_COMMUNITY)
Admission: RE | Admit: 2020-04-16 | Discharge: 2020-04-16 | Disposition: A | Discharge status: Home / Self Care | Payer: PPO | Source: Ambulatory Visit | Attending: Cardiology | Admitting: Cardiology

## 2020-04-16 DIAGNOSIS — I2089 Other forms of angina pectoris: Secondary | ICD-10-CM

## 2020-04-16 DIAGNOSIS — I208 Other forms of angina pectoris: Secondary | ICD-10-CM | POA: Insufficient documentation

## 2020-04-16 MED ORDER — IOHEXOL 350 MG/ML SOLN
80.0000 mL | Freq: Once | INTRAVENOUS | Status: AC | PRN
  Administered 2020-04-16: 80 mL via INTRAVENOUS

## 2020-04-16 MED ORDER — NITROGLYCERIN 0.4 MG SL SUBL
0.8000 mg | SUBLINGUAL_TABLET | Freq: Once | SUBLINGUAL | Status: AC
  Administered 2020-04-16: 0.8 mg via SUBLINGUAL

## 2020-04-16 MED ORDER — NITROGLYCERIN 0.4 MG SL SUBL
SUBLINGUAL_TABLET | SUBLINGUAL | Status: AC
  Filled 2020-04-16: qty 2

## 2020-04-17 ENCOUNTER — Telehealth: Payer: Self-pay | Admitting: Cardiology

## 2020-04-17 ENCOUNTER — Other Ambulatory Visit: Payer: Self-pay

## 2020-04-17 DIAGNOSIS — H353131 Nonexudative age-related macular degeneration, bilateral, early dry stage: Secondary | ICD-10-CM | POA: Diagnosis not present

## 2020-04-17 DIAGNOSIS — H01002 Unspecified blepharitis right lower eyelid: Secondary | ICD-10-CM | POA: Diagnosis not present

## 2020-04-17 DIAGNOSIS — H01001 Unspecified blepharitis right upper eyelid: Secondary | ICD-10-CM | POA: Diagnosis not present

## 2020-04-17 DIAGNOSIS — Z7689 Persons encountering health services in other specified circumstances: Secondary | ICD-10-CM

## 2020-04-17 DIAGNOSIS — H25813 Combined forms of age-related cataract, bilateral: Secondary | ICD-10-CM | POA: Diagnosis not present

## 2020-04-17 NOTE — Telephone Encounter (Signed)
Yes, I will ask my [place] office staff to make a referral to a nutritionist.

## 2020-04-17 NOTE — Telephone Encounter (Signed)
Spoke to patient who wanted advice about either coming into out office to have a work up due to SOB or go to PCP. I told patient it would be better to go to PCP and get everything else ruled out.

## 2020-04-17 NOTE — Progress Notes (Signed)
Per Dr. Domenic Polite, referral to Nutritionist has been placed.

## 2020-04-17 NOTE — Telephone Encounter (Signed)
New message   Pt c/o Shortness Of Breath: STAT if SOB developed within the last 24 hours or pt is noticeably SOB on the phone  1. Are you currently SOB (can you hear that pt is SOB on the phone)? no  2. How long have you been experiencing SOB? A couple weeks -it comes and goes, it hits all of a sudden and she cant get her breathe   3. Are you SOB when sitting or when up moving around? Both   4. Are you currently experiencing any other symptoms? no

## 2020-04-21 ENCOUNTER — Other Ambulatory Visit: Payer: Self-pay

## 2020-04-21 ENCOUNTER — Encounter (INDEPENDENT_AMBULATORY_CARE_PROVIDER_SITE_OTHER): Payer: Self-pay | Admitting: *Deleted

## 2020-04-21 ENCOUNTER — Ambulatory Visit (INDEPENDENT_AMBULATORY_CARE_PROVIDER_SITE_OTHER): Payer: PPO | Admitting: Nurse Practitioner

## 2020-04-21 ENCOUNTER — Encounter: Payer: Self-pay | Admitting: Nurse Practitioner

## 2020-04-21 VITALS — BP 150/76 | HR 84 | Temp 98.2°F | Resp 22 | Ht 61.5 in | Wt 198.0 lb

## 2020-04-21 DIAGNOSIS — I10 Essential (primary) hypertension: Secondary | ICD-10-CM

## 2020-04-21 DIAGNOSIS — E890 Postprocedural hypothyroidism: Secondary | ICD-10-CM

## 2020-04-21 DIAGNOSIS — E785 Hyperlipidemia, unspecified: Secondary | ICD-10-CM | POA: Diagnosis not present

## 2020-04-21 DIAGNOSIS — Z23 Encounter for immunization: Secondary | ICD-10-CM

## 2020-04-21 DIAGNOSIS — D519 Vitamin B12 deficiency anemia, unspecified: Secondary | ICD-10-CM | POA: Diagnosis not present

## 2020-04-21 DIAGNOSIS — Z1231 Encounter for screening mammogram for malignant neoplasm of breast: Secondary | ICD-10-CM | POA: Diagnosis not present

## 2020-04-21 DIAGNOSIS — Z0000 Encounter for general adult medical examination without abnormal findings: Secondary | ICD-10-CM | POA: Diagnosis not present

## 2020-04-21 DIAGNOSIS — Z139 Encounter for screening, unspecified: Secondary | ICD-10-CM | POA: Diagnosis not present

## 2020-04-21 DIAGNOSIS — R7301 Impaired fasting glucose: Secondary | ICD-10-CM

## 2020-04-21 DIAGNOSIS — K219 Gastro-esophageal reflux disease without esophagitis: Secondary | ICD-10-CM

## 2020-04-21 MED ORDER — BENZONATATE 100 MG PO CAPS: 100.0000 mg | ORAL_CAPSULE | Freq: Two times a day (BID) | ORAL | 0 refills | Status: DC | PRN

## 2020-04-21 MED ORDER — ATORVASTATIN CALCIUM 40 MG PO TABS: 40.0000 mg | ORAL_TABLET | Freq: Every day | ORAL | 3 refills | Status: DC

## 2020-04-21 NOTE — Assessment & Plan Note (Signed)
Prevnar 13 today

## 2020-04-21 NOTE — Patient Instructions (Addendum)
Call and let us know if you are taking dexilant (dexlansoprazole).  I sent in atorvastatin for your cholesterol.  Please have fasting labs drawn 2-3 days prior to your appointment so we can discuss the results during your office visit.

## 2020-04-21 NOTE — Assessment & Plan Note (Signed)
Lab Results  Component Value Date   TSH 2.600 04/11/2020  -continue synthroid 112 mcg

## 2020-04-21 NOTE — Assessment & Plan Note (Signed)
-  she is unsure if she has been taking dexilant -she will check her home meds, if she isn't would consider sending in protonix for her -referred to GI for screening colonoscopy; may benefit from EGD at same time

## 2020-04-21 NOTE — Assessment & Plan Note (Signed)
-  B12 was WNL; no need to change/add meds

## 2020-04-21 NOTE — Assessment & Plan Note (Signed)
-  Rx. Atorvastatin Lab Results  Component Value Date   CHOL 262 (H) 04/11/2020   HDL 83 04/11/2020   LDLCALC 165 (H) 04/11/2020   TRIG 82 04/11/2020

## 2020-04-21 NOTE — Addendum Note (Signed)
Addended by: Lonn Georgia on: 04/21/2020 02:12 PM   Modules accepted: Orders

## 2020-04-21 NOTE — Assessment & Plan Note (Signed)
-  BP slightly elevated today -will recheck at next OV, if still elevated may change BP meds

## 2020-04-21 NOTE — Progress Notes (Signed)
Established Patient Office Visit  Subjective:  Patient ID: [name], female    DOB: [birth year]  Age: 75 y.o. MRN: [id number]  CC:  Chief Complaint  Patient presents with  . Hypertension    HPI [name] presents for physical exam.  She sees Laurin Coder for GYN exams. She is followed by Dr. Reece Levy for bipolar. Has had psychosis episodes in the past.  She was having urinary frequency at last OV, and she was prescribed cipro for possible UTI, but culture grew nothing.  She got B12 injections in the past, but B12 was WNL on labs.  She has been having exertional dyspnea, but is followed by cardiology for this. Last visit 03/31/20, and she had cardiac imaging that was reassuring.  Past Medical History:  Diagnosis Date  . Allergic rhinitis   . B12 deficiency   . Bipolar disorder ([place])   . Essential hypertension   . GERD (gastroesophageal reflux disease)   . Hashimoto's thyroiditis   . History of transient ischemic attack (TIA)    Or possibly migraine as well as right amaurosis fugax and ataxia - Dr. Erling Cruz  . Hyperlipidemia   . PSVT (paroxysmal supraventricular tachycardia) ([place])     Past Surgical History:  Procedure Laterality Date  . LAPAROSCOPIC CHOLECYSTECTOMY  2010  . THYROIDECTOMY  2009    Family History  Problem Relation Age of Onset  . Stroke Mother   . Thyroid disease Mother   . Depression Father   . Hypertension Sister   . Aneurysm Sister   . Diabetes Brother   . Heart attack Brother   . Hypertension Brother   . Heart failure Brother   . Thyroid disease Brother   . Stroke Maternal Grandmother   . Heart failure Maternal Grandmother   . Heart failure Maternal Grandfather   . Stroke Maternal Grandfather   . Heart failure Paternal Grandmother     Social History   Socioeconomic History  . Marital status: Widowed    Spouse name: Not on file  . Number of children: 2  . Years of education: Not on file  . Highest education level: Not on file   Occupational History  . Occupation: homemaker    Employer: RETIRED  Tobacco Use  . Smoking status: Former Smoker    Packs/day: 0.50    Years: 10.00    Pack years: 5.00    Types: Cigarettes    Quit date: 01/26/1983    Years since quitting: 37.2  . Smokeless tobacco: Never Used  . Tobacco comment: quit 30 years ago  Vaping Use  . Vaping Use: Never used  Substance and Sexual Activity  . Alcohol use: No    Alcohol/week: 0.0 standard drinks  . Drug use: No  . Sexual activity: Not Currently  Other Topics Concern  . Not on file  Social History Narrative   Regular exercise: walk when able   Caffeine use: occasionally   Social Determinants of Health   Financial Resource Strain: Not on file  Food Insecurity: Not on file  Transportation Needs: Not on file  Physical Activity: Not on file  Stress: Not on file  Social Connections: Not on file  Intimate Partner Violence: Not on file    Outpatient Medications Prior to Visit  Medication Sig Dispense Refill  . acetaminophen (TYLENOL) 500 MG tablet 2 tabs    . Ascorbic Acid (VITAMIN C PO) Take 1 tablet by mouth daily.    Marland Kitchen aspirin EC 81 MG tablet Take  81 mg by mouth daily.    Marland Kitchen buPROPion (WELLBUTRIN XL) 300 MG 24 hr tablet Take 300 mg by mouth daily.     . calcium carbonate (TUMS - DOSED IN MG ELEMENTAL CALCIUM) 500 MG chewable tablet Chew 4 tablets by mouth daily.    . clonazePAM (KLONOPIN) 1 MG tablet Take 1 mg by mouth at bedtime. May take up to 3 times daily as needed for anxiety    . dexlansoprazole (DEXILANT) 60 MG capsule 1 capsule    . DULoxetine (CYMBALTA) 60 MG capsule Take 60 mg by mouth 2 (two) times daily.     Marland Kitchen losartan (COZAAR) 50 MG tablet Take 1 tablet (50 mg total) by mouth in the morning and at bedtime. 60 tablet 1  . LYBALVI 5-10 MG TABS TAKE (1) TABLET BY MOUTHFAT BEDTIME.    . meloxicam (MOBIC) 15 MG tablet TAKE ONE TABLET BY MOUTHMONCE DAILY FOR 2 WEEKS; THEN AS NEEDED.    . Multiple Vitamin (MULTIVITAMIN) tablet  Take 1 tablet by mouth daily.    . pantoprazole (PROTONIX) 40 MG tablet Take 40 mg by mouth every morning.    . Polysaccharide Iron Complex (IRON UP) 15 MG/0.5ML LIQD Take 15 mLs by mouth daily.    . Saccharomyces boulardii (FLORASTOR PO) Take 1 tablet by mouth in the morning and at bedtime.    Marland Kitchen SYNTHROID 112 MCG tablet Take 112 mcg by mouth every morning.    . vitamin B-12 (CYANOCOBALAMIN) 500 MCG tablet Take 500 mcg by mouth daily.    . ciprofloxacin (CIPRO) 500 MG tablet Take 1 tablet (500 mg total) by mouth 2 (two) times daily. (Patient not taking: Reported on 04/21/2020) 10 tablet 0  . diltiazem (CARDIZEM CD) 240 MG 24 hr capsule Take 1 capsule (240 mg total) by mouth daily. 90 capsule 3   No facility-administered medications prior to visit.    Allergies  Allergen Reactions  . Penicillins     rash  . Amoxicillin-Pot Clavulanate Rash  . Atenolol Rash  . Sulfonamide Derivatives Rash    ROS Review of Systems  Constitutional: Negative.   HENT: Negative.   Eyes: Negative.   Respiratory: Positive for shortness of breath.        SOB with exertion  Cardiovascular: Negative.   Gastrointestinal: Positive for abdominal pain.       Indigestion/reflux pain  Endocrine: Negative.   Genitourinary: Negative.   Musculoskeletal: Positive for arthralgias.       Bilateral knee pain (L worse than R)  Skin: Negative.   Neurological: Negative.   Hematological: Negative.   Psychiatric/Behavioral: Negative.       Objective:    Physical Exam Constitutional:      Appearance: She is obese.  HENT:     Head: Normocephalic and atraumatic.     Right Ear: Tympanic membrane, ear canal and external ear normal.     Left Ear: Tympanic membrane, ear canal and external ear normal.     Nose: Nose normal.     Mouth/Throat:     Mouth: Mucous membranes are moist.     Pharynx: Oropharynx is clear.  Eyes:     Extraocular Movements: Extraocular movements intact.     Conjunctiva/sclera: Conjunctivae  normal.     Pupils: Pupils are equal, round, and reactive to light.  Cardiovascular:     Rate and Rhythm: Normal rate and regular rhythm.     Pulses: Normal pulses.     Heart sounds: Normal heart sounds.  Pulmonary:  Effort: Pulmonary effort is normal.     Breath sounds: Normal breath sounds.  Abdominal:     General: Abdomen is flat. Bowel sounds are normal.     Palpations: Abdomen is soft.  Musculoskeletal:        General: Normal range of motion.     Cervical back: Normal range of motion and neck supple.  Skin:    General: Skin is warm and dry.     Capillary Refill: Capillary refill takes less than 2 seconds.  Neurological:     General: No focal deficit present.     Mental Status: She is alert and oriented to person, place, and time.     Cranial Nerves: No cranial nerve deficit.     Sensory: No sensory deficit.     Motor: No weakness.     Coordination: Coordination normal.     Gait: Gait normal.  Psychiatric:        Mood and Affect: Mood normal.        Behavior: Behavior normal.        Thought Content: Thought content normal.        Judgment: Judgment normal.     BP (!) 150/76   Pulse 84   Temp 98.2 F (36.8 C)   Resp (!) 22   Ht 5' 1.5" (1.562 m)   Wt 198 lb (89.8 kg)   SpO2 94%   BMI 36.81 kg/m  Wt Readings from Last 3 Encounters:  04/21/20 198 lb (89.8 kg)  04/07/20 194 lb (88 kg)  03/31/20 196 lb (88.9 kg)     Health Maintenance Due  Topic Date Due  . Hepatitis C Screening  Never done  . COLONOSCOPY (Pts 45-85yrs Insurance coverage will need to be confirmed)  Never done  . MAMMOGRAM  Never done  . DEXA SCAN  Never done  . PNA vac Low Risk Adult (1 of 2 - PCV13) Never done  . COVID-19 Vaccine (3 - Booster) 10/05/2019    There are no preventive care reminders to display for this patient.  Lab Results  Component Value Date   TSH 2.600 04/11/2020   Lab Results  Component Value Date   WBC 8.7 04/11/2020   HGB 14.5 04/11/2020   HCT 42.0  04/11/2020   MCV 94 04/11/2020   PLT 477 (H) 04/11/2020   Lab Results  Component Value Date   NA 138 04/11/2020   K 4.7 04/11/2020   CO2 22 04/11/2020   GLUCOSE 112 (H) 04/11/2020   BUN 8 04/11/2020   CREATININE 0.70 04/11/2020   BILITOT 0.4 04/11/2020   ALKPHOS 103 04/11/2020   AST 20 04/11/2020   ALT 19 04/11/2020   PROT 7.2 04/11/2020   ALBUMIN 4.6 04/11/2020   CALCIUM 9.8 04/11/2020   ANIONGAP 9 04/10/2020   Lab Results  Component Value Date   CHOL 262 (H) 04/11/2020   Lab Results  Component Value Date   HDL 83 04/11/2020   Lab Results  Component Value Date   LDLCALC 165 (H) 04/11/2020   Lab Results  Component Value Date   TRIG 82 04/11/2020   No results found for: CHOLHDL No results found for: HGBA1C    Assessment & Plan:   Problem List Items Addressed This Visit      Cardiovascular and Mediastinum   ESSENTIAL HYPERTENSION, BENIGN    -BP slightly elevated today -will recheck at next OV, if still elevated may change BP meds      Relevant Medications  atorvastatin (LIPITOR) 40 MG tablet     Digestive   Esophageal reflux    -she is unsure if she has been taking dexilant -she will check her home meds, if she isn't would consider sending in protonix for her -referred to GI for screening colonoscopy; may benefit from EGD at same time      Relevant Orders   Ambulatory referral to Gastroenterology     Endocrine   Postsurgical hypothyroidism    Lab Results  Component Value Date   TSH 2.600 04/11/2020  -continue synthroid 112 mcg      Relevant Orders   TSH + free T4     Other   Hyperlipidemia    -Rx. Atorvastatin Lab Results  Component Value Date   CHOL 262 (H) 04/11/2020   HDL 83 04/11/2020   LDLCALC 165 (H) 04/11/2020   TRIG 82 04/11/2020         Relevant Medications   atorvastatin (LIPITOR) 40 MG tablet   Other Relevant Orders   CBC with Differential/Platelet   Lipid Panel With LDL/HDL Ratio   B12 deficiency anemia    -B12  was WNL; no need to change/add meds      Immunization due    -Prevnar-13 today       Other Visit Diagnoses    Routine medical exam    -  Primary   Relevant Orders   MM Digital Screening   DG Bone Density   Ambulatory referral to Gastroenterology   CBC with Differential/Platelet   Lipid Panel With LDL/HDL Ratio   TSH + free T4   Hemoglobin A1c   Encounter for screening mammogram for breast cancer       Relevant Orders   MM Digital Screening   Screening due       Relevant Orders   DG Bone Density   Ambulatory referral to Gastroenterology   Impaired fasting glucose       Relevant Orders   Hemoglobin A1c      Meds ordered this encounter  Medications  . atorvastatin (LIPITOR) 40 MG tablet    Sig: Take 1 tablet (40 mg total) by mouth daily. Take in the evening.    Dispense:  90 tablet    Refill:  3  . benzonatate (TESSALON) 100 MG capsule    Sig: Take 1 capsule (100 mg total) by mouth 2 (two) times daily as needed for cough.    Dispense:  20 capsule    Refill:  0    Follow-up: Return in about 3 months (around 07/22/2020).    Noreene Larsson, NP

## 2020-04-28 ENCOUNTER — Telehealth: Payer: Self-pay

## 2020-04-28 NOTE — Telephone Encounter (Signed)
Patient called left voice mail. She is taking dexilant.

## 2020-04-29 ENCOUNTER — Other Ambulatory Visit: Payer: Self-pay | Admitting: Nurse Practitioner

## 2020-04-29 NOTE — Telephone Encounter (Signed)
Pt informed

## 2020-04-29 NOTE — Telephone Encounter (Signed)
FYI

## 2020-04-29 NOTE — Telephone Encounter (Signed)
Alright. I won't send in another PPI, since she is currently taking one.  If she has GERD symptoms, since she is already being set up with GI, she should discuss this with them at her consult for colonoscopy.

## 2020-05-02 DIAGNOSIS — M1711 Unilateral primary osteoarthritis, right knee: Secondary | ICD-10-CM | POA: Diagnosis not present

## 2020-05-02 DIAGNOSIS — M170 Bilateral primary osteoarthritis of knee: Secondary | ICD-10-CM | POA: Diagnosis not present

## 2020-05-02 DIAGNOSIS — M1712 Unilateral primary osteoarthritis, left knee: Secondary | ICD-10-CM | POA: Diagnosis not present

## 2020-05-07 ENCOUNTER — Encounter: Payer: Self-pay | Admitting: Nurse Practitioner

## 2020-05-07 NOTE — Telephone Encounter (Signed)
ok 

## 2020-05-12 ENCOUNTER — Telehealth: Payer: Self-pay

## 2020-05-12 NOTE — Telephone Encounter (Signed)
Pt wanted to know if her blood pressure medication was new, looks like cholesterol was new. Informed.

## 2020-05-12 NOTE — Telephone Encounter (Signed)
Please contact patient about her new blood pressure medicine. Concern keeps her going to bathroom. Patient call back # [phone]

## 2020-05-14 NOTE — H&P (Signed)
Surgical History & Physical  Patient Name: [name] DOB: 1945-08-01  Surgery: Cataract extraction with intraocular lens implant phacoemulsification; Left Eye  Surgeon: Baruch Goldmann MD Surgery Date:  05/23/2020 Pre-Op Date:  05/14/2020  HPI: A 15 Yr. old female patient Pt referred by Dr. Jorja Loa for cataract evaluation. The patient complains of difficulty when driving at night, which began months ago. Both eyes are affected but OS>OD. The condition's severity is worsening. The complaint is associated with blurry vision and glare. Pt has d/c driving at night. Symptoms are negativity affecting pt's quality of life. Pt denies any increase in floaters or flashes' of light. HPI was performed by Baruch Goldmann .  Medical History: Dry Eyes Cataracts GE:[id number] Retinopathy, OD:PVD Allergies, Anxiety, cholesterol, Depression Thyroid Problems  Review of Systems Negative Allergic/Immunologic Negative Cardiovascular Negative Constitutional Negative Ear, Nose, Mouth & Throat Negative Endocrine Negative Eyes Negative Gastrointestinal Negative Genitourinary Negative Hemotologic/Lymphatic Negative Integumentary Negative Musculoskeletal Negative Neurological Negative Psychiatry Negative Respiratory  Social   Never smoked   Medication Systane Balance,  Abilify, Aspirin, Bupropion, Cardizem, Dexilant, Klonopin, Lorsatan, Miralax, Synthroid, Vitamin B-12 shot, Vitamin D3, Wellbutrin XL,   Sx/Procedures Gallbladder, Tubal Ligation,   Drug Allergies  Amoxicillin, Flagyl,   History & Physical: Heent:  Cataract, Left eye NECK: supple without bruits LUNGS: lungs clear to auscultation CV: regular rate and rhythm Abdomen: soft and non-tender  Impression & Plan: Assessment: 1.  COMBINED FORMS AGE RELATED CATARACT; Both Eyes (H25.813) 2.  ARMD DRY; Both Eyes Early (H35.3131) 3.  BLEPHARITIS; Right Upper Lid, Right Lower Lid, Left Upper Lid, Left Lower Lid  (H01.001, H01.002,H01.004,H01.005) 4.  ASTIGMATISM, REGULAR; Both Eyes (H52.223)  Plan: 1.  Cataract accounts for the patient's decreased vision. This visual impairment is not correctable with a tolerable change in glasses or contact lenses. Cataract surgery with an implantation of a new lens should significantly improve the visual and functional status of the patient. Discussed all risks, benefits, alternatives, and potential complications. Discussed the procedures and recovery. Patient desires to have surgery. A-scan ordered and performed today for intra-ocular lens calculations. The surgery will be performed in order to improve vision for driving, reading, and for eye examinations. Recommend phacoemulsification with intra-ocular lens. Recommend Dextenza for post-operative pain and inflammation. Left Eye worse - first. Dilates well - shugarcaine by protocol. Toric Lens.  2.  Mild. Strong family history. OCT macula shows no fluid. Start AREDS 2 vitamins.  3.  regular lid cleaning.  4.  recommend toric IOL.

## 2020-05-16 ENCOUNTER — Other Ambulatory Visit: Payer: Self-pay

## 2020-05-16 ENCOUNTER — Encounter (HOSPITAL_COMMUNITY)
Admission: RE | Admit: 2020-05-16 | Discharge: 2020-05-16 | Disposition: A | Discharge status: Home / Self Care | Payer: PPO | Source: Ambulatory Visit | Attending: Ophthalmology | Admitting: Ophthalmology

## 2020-05-19 DIAGNOSIS — H25812 Combined forms of age-related cataract, left eye: Secondary | ICD-10-CM | POA: Diagnosis not present

## 2020-05-21 ENCOUNTER — Encounter (HOSPITAL_COMMUNITY): Payer: Self-pay | Admitting: Ophthalmology

## 2020-05-21 ENCOUNTER — Other Ambulatory Visit (HOSPITAL_COMMUNITY)
Admission: RE | Admit: 2020-05-21 | Discharge: 2020-05-21 | Disposition: A | Discharge status: Home / Self Care | Payer: PPO | Source: Ambulatory Visit | Attending: Ophthalmology | Admitting: Ophthalmology

## 2020-05-21 ENCOUNTER — Other Ambulatory Visit: Payer: Self-pay

## 2020-05-21 DIAGNOSIS — Z01812 Encounter for preprocedural laboratory examination: Secondary | ICD-10-CM | POA: Insufficient documentation

## 2020-05-21 DIAGNOSIS — Z20822 Contact with and (suspected) exposure to covid-19: Secondary | ICD-10-CM | POA: Insufficient documentation

## 2020-05-21 LAB — SARS CORONAVIRUS 2 (TAT 6-24 HRS): SARS Coronavirus 2: NEGATIVE

## 2020-05-23 ENCOUNTER — Encounter (HOSPITAL_COMMUNITY)
Admission: RE | Disposition: A | Discharge status: Home / Self Care | Payer: Self-pay | Source: Home / Self Care | Attending: Ophthalmology

## 2020-05-23 ENCOUNTER — Ambulatory Visit (HOSPITAL_COMMUNITY): Payer: PPO | Admitting: Anesthesiology

## 2020-05-23 ENCOUNTER — Ambulatory Visit (HOSPITAL_COMMUNITY)
Admission: RE | Admit: 2020-05-23 | Discharge: 2020-05-23 | Disposition: A | Discharge status: Home / Self Care | Payer: PPO | Attending: Ophthalmology | Admitting: Ophthalmology

## 2020-05-23 ENCOUNTER — Encounter (HOSPITAL_COMMUNITY): Payer: Self-pay | Admitting: Ophthalmology

## 2020-05-23 DIAGNOSIS — I471 Supraventricular tachycardia: Secondary | ICD-10-CM | POA: Diagnosis not present

## 2020-05-23 DIAGNOSIS — E079 Disorder of thyroid, unspecified: Secondary | ICD-10-CM | POA: Diagnosis not present

## 2020-05-23 DIAGNOSIS — Z79890 Hormone replacement therapy (postmenopausal): Secondary | ICD-10-CM | POA: Insufficient documentation

## 2020-05-23 DIAGNOSIS — H0100A Unspecified blepharitis right eye, upper and lower eyelids: Secondary | ICD-10-CM | POA: Diagnosis not present

## 2020-05-23 DIAGNOSIS — Z888 Allergy status to other drugs, medicaments and biological substances status: Secondary | ICD-10-CM | POA: Insufficient documentation

## 2020-05-23 DIAGNOSIS — H0100B Unspecified blepharitis left eye, upper and lower eyelids: Secondary | ICD-10-CM | POA: Insufficient documentation

## 2020-05-23 DIAGNOSIS — H25813 Combined forms of age-related cataract, bilateral: Secondary | ICD-10-CM | POA: Insufficient documentation

## 2020-05-23 DIAGNOSIS — Z880 Allergy status to penicillin: Secondary | ICD-10-CM | POA: Diagnosis not present

## 2020-05-23 DIAGNOSIS — Z79899 Other long term (current) drug therapy: Secondary | ICD-10-CM | POA: Insufficient documentation

## 2020-05-23 DIAGNOSIS — H2512 Age-related nuclear cataract, left eye: Secondary | ICD-10-CM | POA: Diagnosis not present

## 2020-05-23 DIAGNOSIS — Z881 Allergy status to other antibiotic agents status: Secondary | ICD-10-CM | POA: Insufficient documentation

## 2020-05-23 DIAGNOSIS — H52223 Regular astigmatism, bilateral: Secondary | ICD-10-CM | POA: Insufficient documentation

## 2020-05-23 DIAGNOSIS — Z7982 Long term (current) use of aspirin: Secondary | ICD-10-CM | POA: Diagnosis not present

## 2020-05-23 DIAGNOSIS — H25812 Combined forms of age-related cataract, left eye: Secondary | ICD-10-CM | POA: Diagnosis not present

## 2020-05-23 HISTORY — PX: CATARACT EXTRACTION W/PHACO: SHX586

## 2020-05-23 SURGERY — PHACOEMULSIFICATION, CATARACT, WITH IOL INSERTION
Anesthesia: Monitor Anesthesia Care | Site: Eye | Laterality: Left

## 2020-05-23 MED ORDER — EPINEPHRINE PF 1 MG/ML IJ SOLN
INTRAMUSCULAR | Status: AC
  Filled 2020-05-23: qty 2

## 2020-05-23 MED ORDER — LIDOCAINE HCL (PF) 1 % IJ SOLN
INTRAOCULAR | Status: DC | PRN
  Administered 2020-05-23: 1 mL via OPHTHALMIC

## 2020-05-23 MED ORDER — MIDAZOLAM HCL 2 MG/2ML IJ SOLN
INTRAMUSCULAR | Status: DC | PRN
  Administered 2020-05-23: 1 mg via INTRAVENOUS

## 2020-05-23 MED ORDER — PHENYLEPHRINE HCL 2.5 % OP SOLN
1.0000 [drp] | OPHTHALMIC | Status: AC | PRN
  Administered 2020-05-23 (×3): 1 [drp] via OPHTHALMIC

## 2020-05-23 MED ORDER — TROPICAMIDE 1 % OP SOLN
1.0000 [drp] | OPHTHALMIC | Status: AC
  Administered 2020-05-23 (×3): 1 [drp] via OPHTHALMIC

## 2020-05-23 MED ORDER — TETRACAINE HCL 0.5 % OP SOLN
1.0000 [drp] | OPHTHALMIC | Status: AC | PRN
  Administered 2020-05-23 (×3): 1 [drp] via OPHTHALMIC

## 2020-05-23 MED ORDER — SODIUM HYALURONATE 23MG/ML IO SOSY
PREFILLED_SYRINGE | INTRAOCULAR | Status: DC | PRN
  Administered 2020-05-23: 0.6 mL via INTRAOCULAR

## 2020-05-23 MED ORDER — EPINEPHRINE PF 1 MG/ML IJ SOLN
INTRAOCULAR | Status: DC | PRN
  Administered 2020-05-23: 500 mL

## 2020-05-23 MED ORDER — LIDOCAINE HCL 3.5 % OP GEL
1.0000 "application " | Freq: Once | OPHTHALMIC | Status: AC
  Administered 2020-05-23: 1 via OPHTHALMIC

## 2020-05-23 MED ORDER — SODIUM HYALURONATE 10 MG/ML IO SOLUTION
PREFILLED_SYRINGE | INTRAOCULAR | Status: DC | PRN
  Administered 2020-05-23: 0.85 mL via INTRAOCULAR

## 2020-05-23 MED ORDER — STERILE WATER FOR IRRIGATION IR SOLN
Status: DC | PRN
  Administered 2020-05-23: 250 mL

## 2020-05-23 MED ORDER — BSS IO SOLN
INTRAOCULAR | Status: DC | PRN
  Administered 2020-05-23: 15 mL

## 2020-05-23 MED ORDER — MIDAZOLAM HCL 2 MG/2ML IJ SOLN
INTRAMUSCULAR | Status: AC
  Filled 2020-05-23: qty 2

## 2020-05-23 MED ORDER — TETRACAINE HCL 0.5 % OP SOLN
1.0000 [drp] | OPHTHALMIC | Status: AC | PRN
  Administered 2020-05-23: 1 [drp] via OPHTHALMIC

## 2020-05-23 MED ORDER — POVIDONE-IODINE 5 % OP SOLN
OPHTHALMIC | Status: DC | PRN
  Administered 2020-05-23: 1 via OPHTHALMIC

## 2020-05-23 SURGICAL SUPPLY — 14 items
CLOTH BEACON ORANGE TIMEOUT ST (SAFETY) ×1 IMPLANT
EYE SHIELD UNIVERSAL CLEAR (GAUZE/BANDAGES/DRESSINGS) ×1 IMPLANT
GLOVE SURG UNDER POLY LF SZ7 (GLOVE) ×2 IMPLANT
NDL HYPO 18GX1.5 BLUNT FILL (NEEDLE) IMPLANT
NEEDLE HYPO 18GX1.5 BLUNT FILL (NEEDLE) ×2 IMPLANT
PAD ARMBOARD 7.5X6 YLW CONV (MISCELLANEOUS) ×1 IMPLANT
PROC W SPEC LENS (INTRAOCULAR LENS)
PROCESS W SPEC LENS (INTRAOCULAR LENS) IMPLANT
SYR TB 1ML LL NO SAFETY (SYRINGE) ×1 IMPLANT
TAPE SURG TRANSPORE 1 IN (GAUZE/BANDAGES/DRESSINGS) IMPLANT
TAPE SURGICAL TRANSPORE 1 IN (GAUZE/BANDAGES/DRESSINGS) ×2
TECNIS EYHANCE TORIC II IOL (Intraocular Lens) ×1 IMPLANT
VISCOELASTIC ADDITIONAL (OPHTHALMIC RELATED) IMPLANT
WATER STERILE IRR 250ML POUR (IV SOLUTION) ×1 IMPLANT

## 2020-05-23 NOTE — Anesthesia Postprocedure Evaluation (Signed)
Anesthesia Post Note  Patient: [name]  Procedure(s) Performed: CATARACT EXTRACTION PHACO AND INTRAOCULAR LENS PLACEMENT LEFT EYE (Left Eye)  Patient location during evaluation: Phase II Anesthesia Type: MAC Level of consciousness: awake and alert and oriented Pain management: pain level controlled Vital Signs Assessment: post-procedure vital signs reviewed and stable Respiratory status: spontaneous breathing and respiratory function stable Cardiovascular status: blood pressure returned to baseline and stable Postop Assessment: no apparent nausea or vomiting Anesthetic complications: no   No complications documented.   Last Vitals:  Vitals:   05/23/20 1030 05/23/20 1058  BP: 131/69 138/68  Pulse: 71 78  Resp: 13 18  Temp:  36.9 C  SpO2: 100% 98%    Last Pain:  Vitals:   05/23/20 1058  TempSrc: Oral  PainSc: 0-No pain                 Camani Sesay C Ezella Kell

## 2020-05-23 NOTE — Discharge Instructions (Signed)
Please discharge patient when stable, will follow up today with Dr. Shoji Pertuit at the [facility] office immediately following discharge.  Leave shield in place until visit.  All paperwork with discharge instructions will be given at the office.  [facility] Address:  [address]  , [place] [address]   PATIENT INSTRUCTIONS POST-ANESTHESIA  IMMEDIATELY FOLLOWING SURGERY:  Do not drive or operate machinery for the first twenty four hours after surgery.  Do not make any important decisions for twenty four hours after surgery or while taking narcotic pain medications or sedatives.  If you develop intractable nausea and vomiting or a severe headache please notify your doctor immediately.  FOLLOW-UP:  Please make an appointment with your surgeon as instructed. You do not need to follow up with anesthesia unless specifically instructed to do so.  WOUND CARE INSTRUCTIONS (if applicable):  Keep a dry clean dressing on the anesthesia/puncture wound site if there is drainage.  Once the wound has quit draining you may leave it open to air.  Generally you should leave the bandage intact for twenty four hours unless there is drainage.  If the epidural site drains for more than 36-48 hours please call the anesthesia department.  QUESTIONS?:  Please feel free to call your physician or the hospital operator if you have any questions, and they will be happy to assist you.       

## 2020-05-23 NOTE — Transfer of Care (Signed)
Immediate Anesthesia Transfer of Care Note  Patient: [name]  Procedure(s) Performed: CATARACT EXTRACTION PHACO AND INTRAOCULAR LENS PLACEMENT LEFT EYE (Left Eye)  Patient Location: PACU  Anesthesia Type:MAC  Level of Consciousness: awake, alert  and oriented  Airway & Oxygen Therapy: Patient Spontanous Breathing  Post-op Assessment: Report given to RN and Post -op Vital signs reviewed and stable  Post vital signs: Reviewed and stable  Last Vitals:  Vitals Value Taken Time  BP    Temp    Pulse    Resp    SpO2      Last Pain:  Vitals:   05/23/20 1015  PainSc: 0-No pain         Complications: No complications documented.

## 2020-05-23 NOTE — Anesthesia Preprocedure Evaluation (Signed)
Anesthesia Evaluation  Patient identified by MRN, date of birth, ID band Patient awake    Reviewed: Allergy & Precautions, NPO status , Patient's Chart, lab work & pertinent test results  History of Anesthesia Complications Negative for: history of anesthetic complications  Airway Mallampati: II  TM Distance: >3 FB Neck ROM: Full    Dental  (+) Dental Advisory Given Crowns :   Pulmonary former smoker,    Pulmonary exam normal breath sounds clear to auscultation       Cardiovascular Exercise Tolerance: Good hypertension, Pt. on medications Normal cardiovascular exam+ dysrhythmias Supra Ventricular Tachycardia  Rhythm:Regular Rate:Normal     Neuro/Psych PSYCHIATRIC DISORDERS Anxiety Bipolar Disorder TIACVA    GI/Hepatic Neg liver ROS, GERD  Medicated,  Endo/Other  Hypothyroidism   Renal/GU negative Renal ROS     Musculoskeletal negative musculoskeletal ROS (+)   Abdominal   Peds  Hematology  (+) anemia ,   Anesthesia Other Findings   Reproductive/Obstetrics negative OB ROS                            Anesthesia Physical Anesthesia Plan  ASA: III  Anesthesia Plan: MAC   Post-op Pain Management:    Induction:   PONV Risk Score and Plan:   Airway Management Planned: Nasal Cannula and Natural Airway  Additional Equipment:   Intra-op Plan:   Post-operative Plan:   Informed Consent: I have reviewed the patients History and Physical, chart, labs and discussed the procedure including the risks, benefits and alternatives for the proposed anesthesia with the patient or authorized representative who has indicated his/her understanding and acceptance.     Dental advisory given  Plan Discussed with: CRNA and Surgeon  Anesthesia Plan Comments:         Anesthesia Quick Evaluation

## 2020-05-23 NOTE — Interval H&P Note (Signed)
History and Physical Interval Note:  05/23/2020 10:32 AM  [name]  has presented today for surgery, with the diagnosis of Nuclear sclerotic cataract - Left eye.  The various methods of treatment have been discussed with the patient and family. After consideration of risks, benefits and other options for treatment, the patient has consented to  Procedure(s) with comments: CATARACT EXTRACTION PHACO AND INTRAOCULAR LENS PLACEMENT ([place]) (Left) - left as a surgical intervention.  The patient's history has been reviewed, patient examined, no change in status, stable for surgery.  I have reviewed the patient's chart and labs.  Questions were answered to the patient's satisfaction.     Baruch Goldmann

## 2020-05-23 NOTE — Op Note (Addendum)
Date of procedure: 05/23/20  Pre-operative diagnosis: Visually significant age-related cataract, Left Eye; Visually Significant Astigmatism, Left Eye (H25.?2)  Post-operative diagnosis: Visually significant age-related cataract, Left Eye; Visually Significant Astigmatism, Left Eye  Procedure: Removal of cataract via phacoemulsification and insertion of intra-ocular lens Wynetta Emery and Johnson [id number] +24.0D into the capsular bag of the Left Eye  Attending surgeon: Gerda Diss. Sharmain Lastra, MD, MA  Anesthesia: MAC, Topical Ak[place]  Complications: None  Estimated Blood Loss: <43m (minimal)  Specimens: None  Implants: As above  Indications:  Visually significant age-related cataract, Left Eye; Visually Significant Astigmatism, Left Eye  Procedure:  The patient was seen and identified in the pre-operative area. The operative eye was identified and dilated.  The operative eye was marked.  Pre-operative toric markers were used to mark the eye at 0 and 180 degrees. Topical anesthesia was administered to the operative eye.     The patient was then to the operative suite and placed in the supine position.  A timeout was performed confirming the patient, procedure to be performed, and all other relevant information.   The patient's face was prepped and draped in the usual fashion for intra-ocular surgery.  A lid speculum was placed into the operative eye and the surgical microscope moved into place and focused.  A superotemporal paracentesis was created using a 20 gauge paracentesis blade.  Shugarcaine was injected into the anterior chamber.  Viscoelastic was injected into the anterior chamber.  A temporal clear-corneal main wound incision was created using a 2.462mmicrokeratome.  A continuous curvilinear capsulorrhexis was initiated using an irrigating cystitome and completed using capsulorrhexis forceps.  Hydrodissection and hydrodeliniation were performed.  Viscoelastic was injected into the anterior chamber.  A  phacoemulsification handpiece and a chopper as a second instrument were used to remove the nucleus and epinucleus. The irrigation/aspiration handpiece was used to remove any remaining cortical material.   The capsular bag was reinflated with viscoelastic, checked, and found to be intact.  The eye was marked to the per-op meridian.  The intraocular lens was inserted into the capsular bag and dialed into place using a Kuglen hook to 179 degrees.  The irrigation/aspiration handpiece was used to remove any remaining viscoelastic.  The clear corneal wound and paracentesis wounds were then hydrated and checked with Weck-Cels to be watertight.  The lid-speculum and drape was removed, and the patient's face was cleaned with a wet and dry 4x4. A clear shield was taped over the eye. The patient was taken to the post-operative care unit in good condition, having tolerated the procedure well.  Post-Op Instructions: [facility] and will receive all other orders and instructions.

## 2020-05-27 ENCOUNTER — Encounter (HOSPITAL_COMMUNITY): Payer: Self-pay | Admitting: Ophthalmology

## 2020-06-04 ENCOUNTER — Ambulatory Visit (HOSPITAL_COMMUNITY)
Admission: RE | Admit: 2020-06-04 | Discharge: 2020-06-04 | Disposition: A | Discharge status: Home / Self Care | Payer: PPO | Source: Ambulatory Visit | Attending: Nurse Practitioner | Admitting: Nurse Practitioner

## 2020-06-04 ENCOUNTER — Other Ambulatory Visit: Payer: Self-pay

## 2020-06-04 DIAGNOSIS — Z0000 Encounter for general adult medical examination without abnormal findings: Secondary | ICD-10-CM

## 2020-06-04 DIAGNOSIS — M810 Age-related osteoporosis without current pathological fracture: Secondary | ICD-10-CM | POA: Insufficient documentation

## 2020-06-04 DIAGNOSIS — Z1231 Encounter for screening mammogram for malignant neoplasm of breast: Secondary | ICD-10-CM | POA: Insufficient documentation

## 2020-06-04 DIAGNOSIS — Z780 Asymptomatic menopausal state: Secondary | ICD-10-CM | POA: Diagnosis not present

## 2020-06-04 DIAGNOSIS — Z13820 Encounter for screening for osteoporosis: Secondary | ICD-10-CM | POA: Insufficient documentation

## 2020-06-04 DIAGNOSIS — Z139 Encounter for screening, unspecified: Secondary | ICD-10-CM

## 2020-06-04 DIAGNOSIS — M85832 Other specified disorders of bone density and structure, left forearm: Secondary | ICD-10-CM | POA: Diagnosis not present

## 2020-06-04 NOTE — Progress Notes (Signed)
Her bone density test results came back, and she has osteoporosis.  There are several options to treat this. There are injections every 6 months, called Prolia. Those are usually given at the hospital because we get those from a specialty pharmacy. The other option is a pill called alendronate that you take once per week.  Does either option seem appealing?

## 2020-06-05 ENCOUNTER — Ambulatory Visit: Payer: PPO | Admitting: Nutrition

## 2020-06-05 ENCOUNTER — Other Ambulatory Visit: Payer: Self-pay | Admitting: Nurse Practitioner

## 2020-06-05 ENCOUNTER — Other Ambulatory Visit (HOSPITAL_COMMUNITY): Payer: Self-pay | Admitting: General Practice

## 2020-06-05 DIAGNOSIS — R928 Other abnormal and inconclusive findings on diagnostic imaging of breast: Secondary | ICD-10-CM

## 2020-06-05 MED ORDER — ALENDRONATE SODIUM 70 MG PO TABS: 70.0000 mg | ORAL_TABLET | ORAL | 11 refills | Status: DC

## 2020-06-05 NOTE — Progress Notes (Signed)
Sent this to [facility]

## 2020-06-05 NOTE — Progress Notes (Signed)
Radiology to schedule left breast diagnostic mammogram and potentially an ultrasound. If she hasn't heard from scheduling in a week, please contact us, and we will order these diagnostic tests. The papers from radiology state that they will order these.

## 2020-06-06 ENCOUNTER — Other Ambulatory Visit: Payer: Self-pay

## 2020-06-06 ENCOUNTER — Encounter (HOSPITAL_COMMUNITY)
Admit: 2020-06-06 | Discharge: 2020-06-06 | Disposition: A | Discharge status: Home / Self Care | Payer: PPO | Attending: Ophthalmology | Admitting: Ophthalmology

## 2020-06-09 ENCOUNTER — Other Ambulatory Visit: Payer: Self-pay

## 2020-06-09 ENCOUNTER — Encounter: Payer: Self-pay | Admitting: Internal Medicine

## 2020-06-09 ENCOUNTER — Telehealth (INDEPENDENT_AMBULATORY_CARE_PROVIDER_SITE_OTHER): Payer: PPO | Admitting: Internal Medicine

## 2020-06-09 DIAGNOSIS — N3000 Acute cystitis without hematuria: Secondary | ICD-10-CM | POA: Diagnosis not present

## 2020-06-09 DIAGNOSIS — R413 Other amnesia: Secondary | ICD-10-CM | POA: Insufficient documentation

## 2020-06-09 DIAGNOSIS — H25811 Combined forms of age-related cataract, right eye: Secondary | ICD-10-CM | POA: Diagnosis not present

## 2020-06-09 DIAGNOSIS — K589 Irritable bowel syndrome without diarrhea: Secondary | ICD-10-CM | POA: Insufficient documentation

## 2020-06-09 DIAGNOSIS — E559 Vitamin D deficiency, unspecified: Secondary | ICD-10-CM | POA: Insufficient documentation

## 2020-06-09 DIAGNOSIS — K5900 Constipation, unspecified: Secondary | ICD-10-CM | POA: Insufficient documentation

## 2020-06-09 DIAGNOSIS — R270 Ataxia, unspecified: Secondary | ICD-10-CM | POA: Insufficient documentation

## 2020-06-09 DIAGNOSIS — R350 Frequency of micturition: Secondary | ICD-10-CM | POA: Diagnosis not present

## 2020-06-09 DIAGNOSIS — K30 Functional dyspepsia: Secondary | ICD-10-CM | POA: Insufficient documentation

## 2020-06-09 DIAGNOSIS — H9190 Unspecified hearing loss, unspecified ear: Secondary | ICD-10-CM | POA: Insufficient documentation

## 2020-06-09 LAB — POCT URINALYSIS DIP (CLINITEK)
Bilirubin, UA: NEGATIVE
Blood, UA: NEGATIVE
Glucose, UA: NEGATIVE mg/dL
Ketones, POC UA: NEGATIVE mg/dL
Nitrite, UA: NEGATIVE
POC PROTEIN,UA: NEGATIVE
Spec Grav, UA: <=1.005 — AB (ref 1.010–1.025)
Urobilinogen, UA: 0.2 E.U./dL
pH, UA: 5.5 (ref 5.0–8.0)

## 2020-06-09 MED ORDER — NITROFURANTOIN MONOHYD MACRO 100 MG PO CAPS: 100.0000 mg | ORAL_CAPSULE | Freq: Two times a day (BID) | ORAL | 0 refills | Status: DC

## 2020-06-09 NOTE — Patient Instructions (Signed)
Urinary Tract Infection, Adult A urinary tract infection (UTI) is an infection of any part of the urinary tract. The urinary tract includes:  The kidneys.  The ureters.  The bladder.  The urethra. These organs make, store, and get rid of pee (urine) in the body. What are the causes? This infection is caused by germs (bacteria) in your genital area. These germs grow and cause swelling (inflammation) of your urinary tract. What increases the risk? The following factors may make you more likely to develop this condition:  Using a small, thin tube (catheter) to drain pee.  Not being able to control when you pee or poop (incontinence).  Being female. If you are female, these things can increase the risk: ? Using these methods to prevent pregnancy:  A medicine that kills sperm (spermicide).  A device that blocks sperm (diaphragm). ? Having low levels of a female hormone (estrogen). ? Being pregnant. You are more likely to develop this condition if:  You have genes that add to your risk.  You are sexually active.  You take antibiotic medicines.  You have trouble peeing because of: ? A prostate that is bigger than normal, if you are female. ? A blockage in the part of your body that drains pee from the bladder. ? A kidney stone. ? A nerve condition that affects your bladder. ? Not getting enough to drink. ? Not peeing often enough.  You have other conditions, such as: ? Diabetes. ? A weak disease-fighting system (immune system). ? Sickle cell disease. ? Gout. ? Injury of the spine. What are the signs or symptoms? Symptoms of this condition include:  Needing to pee right away.  Peeing small amounts often.  Pain or burning when peeing.  Blood in the pee.  Pee that smells bad or not like normal.  Trouble peeing.  Pee that is cloudy.  Fluid coming from the vagina, if you are female.  Pain in the belly or lower back. Other symptoms include:  Vomiting.  Not  feeling hungry.  Feeling mixed up (confused). This may be the first symptom in older adults.  Being tired and grouchy (irritable).  A fever.  Watery poop (diarrhea). How is this treated?  Taking antibiotic medicine.  Taking other medicines.  Drinking enough water. In some cases, you may need to see a specialist. Follow these instructions at home: Medicines  Take over-the-counter and prescription medicines only as told by your doctor.  If you were prescribed an antibiotic medicine, take it as told by your doctor. Do not stop taking it even if you start to feel better. General instructions  Make sure you: ? Pee until your bladder is empty. ? Do not hold pee for a long time. ? Empty your bladder after sex. ? Wipe from front to back after peeing or pooping if you are a female. Use each tissue one time when you wipe.  Drink enough fluid to keep your pee pale yellow.  Keep all follow-up visits.   Contact a doctor if:  You do not get better after 1-2 days.  Your symptoms go away and then come back. Get help right away if:  You have very bad back pain.  You have very bad pain in your lower belly.  You have a fever.  You have chills.  You feeling like you will vomit or you vomit. Summary  A urinary tract infection (UTI) is an infection of any part of the urinary tract.  This condition is caused by   germs in your genital area.  There are many risk factors for a UTI.  Treatment includes antibiotic medicines.  Drink enough fluid to keep your pee pale yellow. This information is not intended to replace advice given to you by your health care provider. Make sure you discuss any questions you have with your health care provider. Document Revised: 08/24/2019 Document Reviewed: 08/24/2019 Elsevier Patient Education  2021 Elsevier Inc.  

## 2020-06-09 NOTE — Progress Notes (Signed)
Virtual Visit via Telephone Note   This visit type was conducted due to national recommendations for restrictions regarding the COVID-19 Pandemic (e.g. social distancing) in an effort to limit this patient's exposure and mitigate transmission in our community.  Due to her co-morbid illnesses, this patient is at least at moderate risk for complications without adequate follow up.  This format is felt to be most appropriate for this patient at this time.  The patient did not have access to video technology/had technical difficulties with video requiring transitioning to audio format only (telephone).  All issues noted in this document were discussed and addressed.  No physical exam could be performed with this format.  Evaluation Performed:  Follow-up visit  Date:  06/09/2020   ID:  [name], DOB Sep 26, 1945, MRN [id number]  Patient Location: Home Provider Location: Office/Clinic  Participants: Patient Location of Patient: Home Location of Provider: Telehealth Consent was obtain for visit to be over via telehealth. I verified that I am speaking with the correct person using two identifiers.  PCP:  Noreene Larsson, NP   Chief Complaint:  Urinary frequency  History of Present Illness:    [name] is a 75 y.o. female who has a televisit for c/o urinary frequency and nocturia for last few days. She also reports fatigue. She denies dysuria or hematuria. She denies any fever, chills, nausea or vomiting. She also reports urgency, but denies any episode of incontinence.  The patient does not have symptoms concerning for COVID-19 infection (fever, chills, cough, or new shortness of breath).   Past Medical, Surgical, Social History, Allergies, and Medications have been Reviewed.  Past Medical History:  Diagnosis Date  . Allergic rhinitis   . B12 deficiency   . Bipolar disorder ([place])   . Essential hypertension   . GERD (gastroesophageal reflux disease)   . Hashimoto's  thyroiditis   . History of transient ischemic attack (TIA)    Or possibly migraine as well as right amaurosis fugax and ataxia - Dr. Erling Cruz  . Hyperlipidemia   . PSVT (paroxysmal supraventricular tachycardia) ([place])    Past Surgical History:  Procedure Laterality Date  . CATARACT EXTRACTION W/PHACO Left 05/23/2020   Procedure: CATARACT EXTRACTION PHACO AND INTRAOCULAR LENS PLACEMENT LEFT EYE;  Surgeon: Baruch Goldmann, MD;  Location: [facility];  Service: Ophthalmology;  Laterality: Left;  left CDE=15.69  . LAPAROSCOPIC CHOLECYSTECTOMY  2010  . THYROIDECTOMY  2009     Current Meds  Medication Sig  . acetaminophen (TYLENOL) 500 MG tablet Take 1,000 mg by mouth every 6 (six) hours as needed for moderate pain or headache.  . alendronate (FOSAMAX) 70 MG tablet Take 1 tablet (70 mg total) by mouth every 7 (seven) days. Take with a full glass of water on an empty stomach.  Marland Kitchen atorvastatin (LIPITOR) 40 MG tablet Take 1 tablet (40 mg total) by mouth daily. Take in the evening. (Patient taking differently: Take 40 mg by mouth every evening.)  . buPROPion (WELLBUTRIN XL) 300 MG 24 hr tablet Take 300 mg by mouth daily.   . calcium carbonate (TUMS - DOSED IN MG ELEMENTAL CALCIUM) 500 MG chewable tablet Chew 4 tablets by mouth daily.  . Cholecalciferol (VITAMIN D3) 125 MCG (5000 UT) CAPS Take 5,000 Units by mouth daily.  . clonazePAM (KLONOPIN) 0.5 MG tablet Take 0.25-0.5 mg by mouth in the morning, at noon, and at bedtime.  Marland Kitchen dexlansoprazole (DEXILANT) 60 MG capsule Take 60 mg by mouth daily.  Marland Kitchen  Digestive Enzymes (SUPER ENZYMES PO) Take 1 tablet by mouth daily.  . DULoxetine (CYMBALTA) 60 MG capsule Take 60 mg by mouth 2 (two) times daily.   . ferrous sulfate 325 (65 FE) MG tablet Take 325 mg by mouth daily with breakfast.  . losartan (COZAAR) 50 MG tablet Take 1 tablet (50 mg total) by mouth in the morning and at bedtime. (Patient taking differently: Take 50 mg by mouth daily.)  . LYBALVI 5-10 MG TABS Take 1  tablet by mouth at bedtime.  . Multiple Vitamins-Minerals (ICAPS AREDS 2 PO) Take 1 capsule by mouth daily.  . nitrofurantoin, macrocrystal-monohydrate, (MACROBID) 100 MG capsule Take 1 capsule (100 mg total) by mouth 2 (two) times daily.  . pantoprazole (PROTONIX) 40 MG tablet Take 40 mg by mouth daily before breakfast.  . SYNTHROID 112 MCG tablet Take 112 mcg by mouth daily before breakfast.  . Vitamin D-Vitamin K (VITAMIN K2-VITAMIN D3 PO) Take 1 tablet by mouth daily.     Allergies:   Amoxicillin-pot clavulanate, Atenolol, Penicillins, and Sulfonamide derivatives   ROS:   Please see the history of present illness.     All other systems reviewed and are negative.   Labs/Other Tests and Data Reviewed:    Recent Labs: 11/05/2019: B Natriuretic Peptide 51.1 04/11/2020: ALT 19; BUN 8; Creatinine, Ser 0.70; Hemoglobin 14.5; Platelets 477; Potassium 4.7; Sodium 138; TSH 2.600   Recent Lipid Panel Lab Results  Component Value Date/Time   CHOL 262 (H) 04/11/2020 08:50 AM   TRIG 82 04/11/2020 08:50 AM   HDL 83 04/11/2020 08:50 AM   LDLCALC 165 (H) 04/11/2020 08:50 AM    Wt Readings from Last 3 Encounters:  04/21/20 198 lb (89.8 kg)  04/07/20 194 lb (88 kg)  03/31/20 196 lb (88.9 kg)      ASSESSMENT & PLAN:    UTI UA reviewed Check urine culture Started Macrobid (Allergic to Penicillin and sulfa) Increase fluid intake If persistent despite appropriate antibiotic, she may need evaluation for urge incontinence.  Time:   Today, I have spent 9 minutes reviewing the chart, including problem list, medications, and with the patient with telehealth technology discussing the above problems.   Medication Adjustments/Labs and Tests Ordered: Current medicines are reviewed at length with the patient today.  Concerns regarding medicines are outlined above.   Tests Ordered: Orders Placed This Encounter  Procedures  . Urine Culture  . POCT URINALYSIS DIP (CLINITEK)    Medication  Changes: Meds ordered this encounter  Medications  . nitrofurantoin, macrocrystal-monohydrate, (MACROBID) 100 MG capsule    Sig: Take 1 capsule (100 mg total) by mouth 2 (two) times daily.    Dispense:  10 capsule    Refill:  0     Note: This dictation was prepared with Dragon dictation along with smaller phrase technology. Similar sounding words can be transcribed inadequately or may not be corrected upon review. Any transcriptional errors that result from this process are unintentional.      Disposition:  Follow up  Signed, Lindell Spar, MD  06/09/2020 4:14 PM     [facility]

## 2020-06-10 NOTE — H&P (Signed)
Surgical History & Physical  Patient Name: [name] DOB: [birth year]  Surgery: Cataract extraction with intraocular lens implant phacoemulsification; Right Eye  Surgeon: Baruch Goldmann MD Surgery Date:  06/13/2020 Pre-Op Date:  06/09/2020  HPI: A 45 Yr. old female patient 1. The patient complains of difficulty when driving at night, which began many years ago. The right eye is affected. The episode is gradual. The condition's severity increased since last visit. Symptoms occur when the patient is driving and outside. Thi 2. The patient is returning after cataract post-op. The left eye is affected. Status post cataract post-op, which began 1 week ago: Since the last visit, the affected area is doing well. The patient's vision is improved. Patient is following medication instructions. HPI was performed by Baruch Goldmann .  Medical History: Dry Eyes Cataracts ER:[id number] Retinopathy, OD:PVD Allergies, Anxiety, cholesterol, Depression Thyroid Problems  Review of Systems Negative Allergic/Immunologic Negative Cardiovascular Negative Constitutional Negative Ear, Nose, Mouth & Throat Negative Endocrine Negative Eyes Negative Gastrointestinal Negative Genitourinary Negative Hemotologic/Lymphatic Negative Integumentary Negative Musculoskeletal Negative Neurological Negative Psychiatry Negative Respiratory  Social   Never smoked   Medication Systane Balance, Prednisolone-gatiflox-bromfenac,  Abilify, Aspirin, Bupropion, Cardizem, Dexilant, Klonopin, Lorsatan, Miralax, Synthroid, Vitamin B-12 shot, Vitamin D3, Wellbutrin XL,   Sx/Procedures Phaco c IOL OS,  Gallbladder, Tubal Ligation,   Drug Allergies  Amoxicillin, Flagyl,   History & Physical: Heent:  Cataract, Right eye NECK: supple without bruits LUNGS: lungs clear to auscultation CV: regular rate and rhythm Abdomen: soft and non-tender  Impression & Plan: Assessment: 1.  COMBINED FORMS AGE RELATED CATARACT;  Both Eyes (H25.813) 2.  CATARACT EXTRACTION STATUS; Left Eye (Z98.42) 3.  INTRAOCULAR LENS IOL (Z96.1) 4.  ASTIGMATISM, REGULAR; Both Eyes (H52.223)  Plan: 1.  Cataract accounts for the patient's decreased vision. This visual impairment is not correctable with a tolerable change in glasses or contact lenses. Cataract surgery with an implantation of a new lens should significantly improve the visual and functional status of the patient. Discussed all risks, benefits, alternatives, and potential complications. Discussed the procedures and recovery. Patient desires to have surgery. A-scan ordered and performed today for intra-ocular lens calculations. The surgery will be performed in order to improve vision for driving, reading, and for eye examinations. Recommend phacoemulsification with intra-ocular lens. Recommend Dextenza for post-operative pain and inflammation. Right Eye. Surgery required to correct imbalance of vision. Dilates well - shugarcaine by protocol. Toric Lens. 2.  1 week after cataract surgery. Doing well with improved vision and normal eye pressure. Call with any problems or concerns. Continue Pred-Moxi-Brom 2x/day for 3 more weeks. 3.  Doing well since surgery Continue Post-op medications 4.  recommend toric IOL.

## 2020-06-11 ENCOUNTER — Other Ambulatory Visit (HOSPITAL_COMMUNITY)
Admission: RE | Admit: 2020-06-11 | Discharge: 2020-06-11 | Disposition: A | Discharge status: Home / Self Care | Payer: PPO | Source: Ambulatory Visit | Attending: Ophthalmology | Admitting: Ophthalmology

## 2020-06-11 ENCOUNTER — Other Ambulatory Visit: Payer: Self-pay

## 2020-06-11 DIAGNOSIS — Z20822 Contact with and (suspected) exposure to covid-19: Secondary | ICD-10-CM | POA: Diagnosis not present

## 2020-06-11 DIAGNOSIS — Z01812 Encounter for preprocedural laboratory examination: Secondary | ICD-10-CM | POA: Insufficient documentation

## 2020-06-11 LAB — URINE CULTURE

## 2020-06-11 LAB — SARS CORONAVIRUS 2 (TAT 6-24 HRS): SARS Coronavirus 2: NEGATIVE

## 2020-06-13 ENCOUNTER — Ambulatory Visit (HOSPITAL_COMMUNITY)
Admission: RE | Admit: 2020-06-13 | Discharge: 2020-06-13 | Disposition: A | Discharge status: Home / Self Care | Payer: PPO | Attending: Ophthalmology | Admitting: Ophthalmology

## 2020-06-13 ENCOUNTER — Encounter (HOSPITAL_COMMUNITY)
Admission: RE | Disposition: A | Discharge status: Home / Self Care | Payer: Self-pay | Source: Home / Self Care | Attending: Ophthalmology

## 2020-06-13 ENCOUNTER — Ambulatory Visit (HOSPITAL_COMMUNITY): Payer: PPO | Admitting: Certified Registered"

## 2020-06-13 ENCOUNTER — Encounter (HOSPITAL_COMMUNITY): Payer: Self-pay | Admitting: Ophthalmology

## 2020-06-13 DIAGNOSIS — Z79899 Other long term (current) drug therapy: Secondary | ICD-10-CM | POA: Insufficient documentation

## 2020-06-13 DIAGNOSIS — Z87891 Personal history of nicotine dependence: Secondary | ICD-10-CM | POA: Insufficient documentation

## 2020-06-13 DIAGNOSIS — Z881 Allergy status to other antibiotic agents status: Secondary | ICD-10-CM | POA: Insufficient documentation

## 2020-06-13 DIAGNOSIS — Z961 Presence of intraocular lens: Secondary | ICD-10-CM | POA: Insufficient documentation

## 2020-06-13 DIAGNOSIS — H25811 Combined forms of age-related cataract, right eye: Secondary | ICD-10-CM | POA: Diagnosis not present

## 2020-06-13 DIAGNOSIS — Z9842 Cataract extraction status, left eye: Secondary | ICD-10-CM | POA: Diagnosis not present

## 2020-06-13 DIAGNOSIS — H52201 Unspecified astigmatism, right eye: Secondary | ICD-10-CM | POA: Insufficient documentation

## 2020-06-13 DIAGNOSIS — Z79890 Hormone replacement therapy (postmenopausal): Secondary | ICD-10-CM | POA: Diagnosis not present

## 2020-06-13 DIAGNOSIS — F319 Bipolar disorder, unspecified: Secondary | ICD-10-CM | POA: Diagnosis not present

## 2020-06-13 DIAGNOSIS — Z880 Allergy status to penicillin: Secondary | ICD-10-CM | POA: Insufficient documentation

## 2020-06-13 DIAGNOSIS — Z7982 Long term (current) use of aspirin: Secondary | ICD-10-CM | POA: Insufficient documentation

## 2020-06-13 DIAGNOSIS — H2511 Age-related nuclear cataract, right eye: Secondary | ICD-10-CM | POA: Diagnosis not present

## 2020-06-13 HISTORY — PX: CATARACT EXTRACTION W/PHACO: SHX586

## 2020-06-13 SURGERY — PHACOEMULSIFICATION, CATARACT, WITH IOL INSERTION
Anesthesia: Monitor Anesthesia Care | Site: Eye | Laterality: Right

## 2020-06-13 MED ORDER — LIDOCAINE HCL 3.5 % OP GEL
1.0000 "application " | Freq: Once | OPHTHALMIC | Status: AC
  Administered 2020-06-13: 1 via OPHTHALMIC

## 2020-06-13 MED ORDER — STERILE WATER FOR IRRIGATION IR SOLN
Status: DC | PRN
  Administered 2020-06-13: 250 mL

## 2020-06-13 MED ORDER — EPINEPHRINE PF 1 MG/ML IJ SOLN
INTRAOCULAR | Status: DC | PRN
  Administered 2020-06-13: 500 mL

## 2020-06-13 MED ORDER — SODIUM HYALURONATE 10 MG/ML IO SOLUTION
PREFILLED_SYRINGE | INTRAOCULAR | Status: DC | PRN
  Administered 2020-06-13: 0.85 mL via INTRAOCULAR

## 2020-06-13 MED ORDER — MIDAZOLAM HCL 2 MG/2ML IJ SOLN
INTRAMUSCULAR | Status: DC | PRN
  Administered 2020-06-13: 1 mg via INTRAVENOUS

## 2020-06-13 MED ORDER — MIDAZOLAM HCL 2 MG/2ML IJ SOLN
INTRAMUSCULAR | Status: AC
  Filled 2020-06-13: qty 2

## 2020-06-13 MED ORDER — PHENYLEPHRINE HCL 2.5 % OP SOLN
1.0000 [drp] | OPHTHALMIC | Status: AC | PRN
  Administered 2020-06-13 (×3): 1 [drp] via OPHTHALMIC

## 2020-06-13 MED ORDER — LIDOCAINE HCL (PF) 1 % IJ SOLN
INTRAOCULAR | Status: DC | PRN
  Administered 2020-06-13: 1 mL via OPHTHALMIC

## 2020-06-13 MED ORDER — SODIUM CHLORIDE (PF) 0.9 % IJ SOLN
INTRAMUSCULAR | Status: AC
  Filled 2020-06-13: qty 10

## 2020-06-13 MED ORDER — POVIDONE-IODINE 5 % OP SOLN
OPHTHALMIC | Status: DC | PRN
  Administered 2020-06-13: 1 via OPHTHALMIC

## 2020-06-13 MED ORDER — BSS IO SOLN
INTRAOCULAR | Status: DC | PRN
  Administered 2020-06-13: 15 mL via INTRAOCULAR

## 2020-06-13 MED ORDER — TETRACAINE HCL 0.5 % OP SOLN
1.0000 [drp] | OPHTHALMIC | Status: AC | PRN
  Administered 2020-06-13 (×3): 1 [drp] via OPHTHALMIC

## 2020-06-13 MED ORDER — EPINEPHRINE PF 1 MG/ML IJ SOLN
INTRAMUSCULAR | Status: AC
  Filled 2020-06-13: qty 2

## 2020-06-13 MED ORDER — TROPICAMIDE 1 % OP SOLN
1.0000 [drp] | OPHTHALMIC | Status: AC
  Administered 2020-06-13 (×3): 1 [drp] via OPHTHALMIC

## 2020-06-13 MED ORDER — SODIUM CHLORIDE 0.9% FLUSH
INTRAVENOUS | Status: DC | PRN
  Administered 2020-06-13: 5 mL via INTRAVENOUS

## 2020-06-13 MED ORDER — SODIUM HYALURONATE 23MG/ML IO SOSY
PREFILLED_SYRINGE | INTRAOCULAR | Status: DC | PRN
  Administered 2020-06-13: 0.6 mL via INTRAOCULAR

## 2020-06-13 SURGICAL SUPPLY — 13 items
CLOTH BEACON ORANGE TIMEOUT ST (SAFETY) ×1 IMPLANT
EYE SHIELD UNIVERSAL CLEAR (GAUZE/BANDAGES/DRESSINGS) ×1 IMPLANT
GLOVE SURG UNDER POLY LF SZ6.5 (GLOVE) ×1 IMPLANT
GLOVE SURG UNDER POLY LF SZ7 (GLOVE) ×1 IMPLANT
NDL HYPO 18GX1.5 BLUNT FILL (NEEDLE) IMPLANT
NEEDLE HYPO 18GX1.5 BLUNT FILL (NEEDLE) ×2 IMPLANT
PAD ARMBOARD 7.5X6 YLW CONV (MISCELLANEOUS) ×1 IMPLANT
SYR TB 1ML LL NO SAFETY (SYRINGE) ×1 IMPLANT
TAPE SURG TRANSPORE 1 IN (GAUZE/BANDAGES/DRESSINGS) IMPLANT
TAPE SURGICAL TRANSPORE 1 IN (GAUZE/BANDAGES/DRESSINGS) ×2
TECNIS EYHANCE TORIC II IOL (Intraocular Lens) ×1 IMPLANT
VISCOELASTIC ADDITIONAL (OPHTHALMIC RELATED) IMPLANT
WATER STERILE IRR 250ML POUR (IV SOLUTION) ×1 IMPLANT

## 2020-06-13 NOTE — Anesthesia Procedure Notes (Signed)
Procedure Name: MAC Date/Time: 06/13/2020 8:05 AM Performed by: Orlie Dakin, CRNA Pre-anesthesia Checklist: Patient identified, Emergency Drugs available, Suction available and Patient being monitored Patient Re-evaluated:Patient Re-evaluated prior to induction Oxygen Delivery Method: Nasal cannula Placement Confirmation: positive ETCO2

## 2020-06-13 NOTE — Discharge Instructions (Addendum)
Please discharge patient when stable, will follow up today with Dr. Wrzosek at the [facility] [place] office immediately following discharge.  Leave shield in place until visit.  All paperwork with discharge instructions will be given at the office.   [facility] Address:  [address]  [place], [place] [address]             Monitored Anesthesia Care, Care After This sheet gives you information about how to care for yourself after your procedure. Your health care provider may also give you more specific instructions. If you have problems or questions, contact your health care provider. What can I expect after the procedure? After the procedure, it is common to have:  Tiredness.  Forgetfulness about what happened after the procedure.  Impaired judgment for important decisions.  Nausea or vomiting.  Some difficulty with balance. Follow these instructions at home: For the time period you were told by your health care provider:  Rest as needed.  Do not participate in activities where you could fall or become injured.  Do not drive or use machinery.  Do not drink alcohol.  Do not take sleeping pills or medicines that cause drowsiness.  Do not make important decisions or sign legal documents.  Do not take care of children on your own.      Eating and drinking  Follow the diet that is recommended by your health care provider.  Drink enough fluid to keep your urine pale yellow.  If you vomit: ? Drink water, juice, or soup when you can drink without vomiting. ? Make sure you have little or no nausea before eating solid foods. General instructions  Have a responsible adult stay with you for the time you are told. It is important to have someone help care for you until you are awake and alert.  Take over-the-counter and prescription medicines only as told by your health care provider.  If you have sleep apnea, surgery and certain  medicines can increase your risk for breathing problems. Follow instructions from your health care provider about wearing your sleep device: ? Anytime you are sleeping, including during daytime naps. ? While taking prescription pain medicines, sleeping medicines, or medicines that make you drowsy.  Avoid smoking.  Keep all follow-up visits as told by your health care provider. This is important. Contact a health care provider if:  You keep feeling nauseous or you keep vomiting.  You feel light-headed.  You are still sleepy or having trouble with balance after 24 hours.  You develop a rash.  You have a fever.  You have redness or swelling around the IV site. Get help right away if:  You have trouble breathing.  You have new-onset confusion at home. Summary  For several hours after your procedure, you may feel tired. You may also be forgetful and have poor judgment.  Have a responsible adult stay with you for the time you are told. It is important to have someone help care for you until you are awake and alert.  Rest as told. Do not drive or operate machinery. Do not drink alcohol or take sleeping pills.  Get help right away if you have trouble breathing, or if you suddenly become confused. This information is not intended to replace advice given to you by your health care provider. Make sure you discuss any questions you have with your health care provider. Document Revised: 09/27/2019 Document Reviewed: 12/14/2018 Elsevier Patient Education  2021 Elsevier Inc.  

## 2020-06-13 NOTE — Op Note (Signed)
Date of procedure: 06/13/20  Pre-operative diagnosis: Visually significant age-related cataract, Right Eye; Visually Significant Astigmatism, Right Eye (H25.?1)  Post-operative diagnosis: Visually significant age-related cataract, Right Eye; Visually Significant Astigmatism, Right Eye  Procedure: Removal of cataract via phacoemulsification and insertion of intra-ocular lens Wynetta Emery and Johnson [id number] +25.0D into the capsular bag of the Right Eye  Attending surgeon: Gerda Diss. Laaibah Wartman, MD, MA  Anesthesia: MAC, Topical Akten  Complications: None  Estimated Blood Loss: <66m (minimal)  Specimens: None  Implants: As above  Indications:  Visually significant age-related cataract, Right Eye; Visually Significant Astigmatism, Right Eye  Procedure:  The patient was seen and identified in the pre-operative area. The operative eye was identified and dilated.  The operative eye was marked.  Pre-operative toric markers were used to mark the eye at 0 and 180 degrees. Topical anesthesia was administered to the operative eye.     The patient was then to the operative suite and placed in the supine position.  A timeout was performed confirming the patient, procedure to be performed, and all other relevant information.   The patient's face was prepped and draped in the usual fashion for intra-ocular surgery.  A lid speculum was placed into the operative eye and the surgical microscope moved into place and focused.  A superotemporal paracentesis was created using a 20 gauge paracentesis blade.  Shugarcaine was injected into the anterior chamber.  Viscoelastic was injected into the anterior chamber.  A temporal clear-corneal main wound incision was created using a 2.460mmicrokeratome.  A continuous curvilinear capsulorrhexis was initiated using an irrigating cystitome and completed using capsulorrhexis forceps.  Hydrodissection and hydrodeliniation were performed.  Viscoelastic was injected into the anterior  chamber.  A phacoemulsification handpiece and a chopper as a second instrument were used to remove the nucleus and epinucleus. The irrigation/aspiration handpiece was used to remove any remaining cortical material.   The capsular bag was reinflated with viscoelastic, checked, and found to be intact.  The intraocular lens was inserted into the capsular bag and dialed into place using a Kuglen hook to ?? degrees.  The irrigation/aspiration handpiece was used to remove any remaining viscoelastic.  The clear corneal wound and paracentesis wounds were then hydrated and checked with Weck-Cels to be watertight.  The lid-speculum and drape was removed, and the patient's face was cleaned with a wet and dry 4x4. A clear shield was taped over the eye. The patient was taken to the post-operative care unit in good condition, having tolerated the procedure well.  Post-Op Instructions: [facility] and will receive all other orders and instructions.

## 2020-06-13 NOTE — Anesthesia Postprocedure Evaluation (Signed)
Anesthesia Post Note  Patient: [name]  Procedure(s) Performed: CATARACT EXTRACTION PHACO AND INTRAOCULAR LENS PLACEMENT RIGHT EYE (Right Eye)  Patient location during evaluation: PACU Anesthesia Type: MAC Level of consciousness: awake and alert Pain management: pain level controlled Vital Signs Assessment: post-procedure vital signs reviewed and stable Respiratory status: spontaneous breathing, nonlabored ventilation, respiratory function stable and patient connected to nasal cannula oxygen Cardiovascular status: stable and blood pressure returned to baseline Postop Assessment: no apparent nausea or vomiting Anesthetic complications: no   No complications documented.   Last Vitals:  Vitals:   06/13/20 0720 06/13/20 0825  BP: (!) 154/65 (!) 145/70  Pulse: 66 73  Resp: 18 16  Temp: 36.7 C 36.9 C  SpO2: 97% 98%    Last Pain:  Vitals:   06/13/20 0825  TempSrc: Oral  PainSc: 0-No pain                 Louann Sjogren

## 2020-06-13 NOTE — Interval H&P Note (Signed)
History and Physical Interval Note:  06/13/2020 7:58 AM  [name]  has presented today for surgery, with the diagnosis of Nuclear sclerotic cataract - Right eye.  The various methods of treatment have been discussed with the patient and family. After consideration of risks, benefits and other options for treatment, the patient has consented to  Procedure(s) with comments: CATARACT EXTRACTION PHACO AND INTRAOCULAR LENS PLACEMENT (IOC) (Right) - right as a surgical intervention.  The patient's history has been reviewed, patient examined, no change in status, stable for surgery.  I have reviewed the patient's chart and labs.  Questions were answered to the patient's satisfaction.     Baruch Goldmann

## 2020-06-13 NOTE — Transfer of Care (Signed)
Immediate Anesthesia Transfer of Care Note  Patient: [name]  Procedure(s) Performed: CATARACT EXTRACTION PHACO AND INTRAOCULAR LENS PLACEMENT RIGHT EYE (Right Eye)  Patient Location: Short Stay  Anesthesia Type:MAC  Level of Consciousness: awake, alert  and oriented  Airway & Oxygen Therapy: Patient Spontanous Breathing  Post-op Assessment: Report given to RN, Post -op Vital signs reviewed and stable and Patient moving all extremities X 4  Post vital signs: Reviewed and stable  Last Vitals:  Vitals Value Taken Time  BP    Temp    Pulse    Resp    SpO2      Last Pain:  Vitals:   06/13/20 0720  TempSrc: Oral  PainSc: 0-No pain      Patients Stated Pain Goal: 5 ([birth year] 4314)  Complications: No complications documented.

## 2020-06-13 NOTE — Anesthesia Preprocedure Evaluation (Signed)
Anesthesia Evaluation  Patient identified by MRN, date of birth, ID band Patient awake    Reviewed: Allergy & Precautions, H&P , NPO status , Patient's Chart, lab work & pertinent test results, reviewed documented beta blocker date and time   Airway Mallampati: II  TM Distance: >3 FB Neck ROM: full    Dental no notable dental hx.    Pulmonary neg pulmonary ROS, former smoker,    Pulmonary exam normal breath sounds clear to auscultation       Cardiovascular Exercise Tolerance: Good hypertension, negative cardio ROS   Rhythm:regular Rate:Normal     Neuro/Psych PSYCHIATRIC DISORDERS Anxiety Bipolar Disorder negative neurological ROS     GI/Hepatic Neg liver ROS, GERD  Medicated,  Endo/Other  Hypothyroidism   Renal/GU negative Renal ROS  negative genitourinary   Musculoskeletal   Abdominal   Peds  Hematology  (+) Blood dyscrasia, anemia ,   Anesthesia Other Findings   Reproductive/Obstetrics negative OB ROS                             Anesthesia Physical Anesthesia Plan  ASA: II  Anesthesia Plan: MAC   Post-op Pain Management:    Induction:   PONV Risk Score and Plan:   Airway Management Planned:   Additional Equipment:   Intra-op Plan:   Post-operative Plan:   Informed Consent: I have reviewed the patients History and Physical, chart, labs and discussed the procedure including the risks, benefits and alternatives for the proposed anesthesia with the patient or authorized representative who has indicated his/her understanding and acceptance.     Dental Advisory Given  Plan Discussed with: CRNA  Anesthesia Plan Comments:         Anesthesia Quick Evaluation

## 2020-06-16 ENCOUNTER — Encounter (HOSPITAL_COMMUNITY): Payer: Self-pay | Admitting: Ophthalmology

## 2020-06-17 ENCOUNTER — Other Ambulatory Visit: Payer: Self-pay

## 2020-06-17 ENCOUNTER — Ambulatory Visit (HOSPITAL_COMMUNITY)
Admission: RE | Admit: 2020-06-17 | Discharge: 2020-06-17 | Disposition: A | Discharge status: Home / Self Care | Payer: PPO | Source: Ambulatory Visit | Attending: Nurse Practitioner | Admitting: Nurse Practitioner

## 2020-06-17 ENCOUNTER — Other Ambulatory Visit: Payer: Self-pay | Admitting: Nurse Practitioner

## 2020-06-17 DIAGNOSIS — R928 Other abnormal and inconclusive findings on diagnostic imaging of breast: Secondary | ICD-10-CM

## 2020-06-17 DIAGNOSIS — N6322 Unspecified lump in the left breast, upper inner quadrant: Secondary | ICD-10-CM | POA: Diagnosis not present

## 2020-06-17 DIAGNOSIS — N6320 Unspecified lump in the left breast, unspecified quadrant: Secondary | ICD-10-CM

## 2020-06-17 NOTE — Progress Notes (Signed)
Sent order for f/u diagnostic mammogram in 6 months.

## 2020-06-18 ENCOUNTER — Encounter: Payer: PPO | Attending: Cardiology | Admitting: Nutrition

## 2020-06-18 DIAGNOSIS — R7301 Impaired fasting glucose: Secondary | ICD-10-CM

## 2020-06-18 DIAGNOSIS — I10 Essential (primary) hypertension: Secondary | ICD-10-CM

## 2020-06-18 DIAGNOSIS — E782 Mixed hyperlipidemia: Secondary | ICD-10-CM | POA: Insufficient documentation

## 2020-06-18 NOTE — Progress Notes (Signed)
Medical Nutrition Therapy  Appointment Start time:  0981  Appointment End time:  75  Primary concerns today: Obesity, Hyperlipidemia, Prediabetes  Referral diagnosis: E 66.9, E78 and R 73.03 Preferred learning style: no preference indicated  See and read Learning readiness:  ready  NUTRITION ASSESSMENT  Has a lot of balance issues.  LIve by herself. Anthropometrics   Wt Readings from Last 3 Encounters:  04/21/20 198 lb (89.8 kg)  04/07/20 194 lb (88 kg)  03/31/20 196 lb (88.9 kg)   Ht Readings from Last 3 Encounters:  04/21/20 5' 1.5" (1.562 m)  04/07/20 5\' 2"  (1.575 m)  03/31/20 5\' 2"  (1.575 m)   There is no height or weight on file to calculate BMI. @BMIFA @ Facility age limit for growth percentiles is 20 years. Facility age limit for growth percentiles is 20 years.   Clinical Medical Hx: IBS, Pre DM, Obesity, HTN, Hyperlipidemia Medications:  See chart Labs:  CMP Latest Ref Rng & Units 04/11/2020 04/10/2020 12/29/2019  Glucose 65 - 99 mg/dL 112(H) 98 89  BUN 8 - 27 mg/dL 8 11 10   Creatinine 0.57 - 1.00 mg/dL 0.70 0.70 0.75  Sodium 134 - 144 mmol/L 138 132(L) 138  Potassium 3.5 - 5.2 mmol/L 4.7 4.8 4.6  Chloride 96 - 106 mmol/L 100 96(L) 103  CO2 20 - 29 mmol/L 22 27 26   Calcium 8.7 - 10.3 mg/dL 9.8 9.4 9.1  Total Protein 6.0 - 8.5 g/dL 7.2 - 7.0  Total Bilirubin 0.0 - 1.2 mg/dL 0.4 - 0.4  Alkaline Phos 44 - 121 IU/L 103 - 81  AST 0 - 40 IU/L 20 - 19  ALT 0 - 32 IU/L 19 - 19   Lipid Panel     Component Value Date/Time   CHOL 262 (H) 04/11/2020 0850   TRIG 82 04/11/2020 0850   HDL 83 04/11/2020 0850   LDLCALC 165 (H) 04/11/2020 0850   LABVLDL 14 04/11/2020 0850     Notable Signs/Symptoms: fatigue, irregular eating pattern  Lifestyle & Dietary Hx Doesn't like to cook. Lives by herself.  Unstable on feet. Has fallen at times. Skips meals at times  Estimated daily fluid intake: 16 oz Supplements:  Sleep: varies-doesn't sleep well. Stays up and sleeps  in. Stress / self-care: health issues, can't walk well or get around. Current average weekly physical activity: ADL  24-Hr Dietary Recall Usually eats 1-2 meals per day  Estimated Energy Needs Calories: 1200 Carbohydrate: 135g Protein: 90g Fat: 44g   NUTRITION DIAGNOSIS  NB-1.1 Food and nutrition-related knowledge deficit As related to Obesity and prediabetes.  As evidenced by BMI > 30 and fasting blood sugars elevated. NUTRITION INTERVENTION  Nutrition education (E-1) on the following topics:  . Nutrition and Diabetes education provided on My Plate, CHO counting, meal planning, portion sizes, timing of meals, avoiding snacks between meals unless having a low blood sugar, target ranges for A1C and blood sugars, signs/symptoms and treatment of hyper/hypoglycemia, monitoring blood sugars, taking medications as prescribed, benefits of exercising 30 minutes per day and prevention of complications of DM. Marland Kitchen Low Cnolesterol Low Sodium Diet . Ways to cut down on empty calories.  Handouts Provided Include   The Plate Method   Meal Plan Card  Diabetes Instructions.   Learning Style & Readiness for Change Teaching method utilized: Visual & Auditory  Demonstrated degree of understanding via: Teach Back  Barriers to learning/adherence to lifestyle change: none  Goals Established by Pt Goals  Eat three meals per day Don't skip  meals Include more vegetables Try Healthy Choice meals Try steamable vegetables in bags Drink water  Try use walker or cane Look into the life alert system   MONITORING & EVALUATION Dietary intake, weekly physical activity, and weight in 1 month.  Next Steps  Patient is to work on eating better balanced meals.Marland Kitchen

## 2020-06-18 NOTE — Patient Instructions (Signed)
Goals  Eat three meals per day Don't skip meals Include more vegetables Try Healthy Choice meals Try steamable vegetables in bags Drink water  Try use walker or cane Look into the life alert system

## 2020-06-19 ENCOUNTER — Ambulatory Visit
Admission: RE | Admit: 2020-06-19 | Discharge: 2020-06-19 | Disposition: A | Discharge status: Home / Self Care | Payer: Self-pay | Source: Ambulatory Visit | Attending: Nurse Practitioner | Admitting: Nurse Practitioner

## 2020-06-19 ENCOUNTER — Other Ambulatory Visit: Payer: Self-pay

## 2020-06-19 ENCOUNTER — Other Ambulatory Visit (HOSPITAL_COMMUNITY): Payer: Self-pay | Admitting: General Practice

## 2020-06-19 ENCOUNTER — Ambulatory Visit: Payer: PPO | Admitting: Physician Assistant

## 2020-06-19 ENCOUNTER — Encounter: Payer: Self-pay | Admitting: Physician Assistant

## 2020-06-19 DIAGNOSIS — D1800 Hemangioma unspecified site: Secondary | ICD-10-CM

## 2020-06-19 DIAGNOSIS — R928 Other abnormal and inconclusive findings on diagnostic imaging of breast: Secondary | ICD-10-CM

## 2020-06-19 DIAGNOSIS — D2239 Melanocytic nevi of other parts of face: Secondary | ICD-10-CM

## 2020-06-19 DIAGNOSIS — L821 Other seborrheic keratosis: Secondary | ICD-10-CM | POA: Diagnosis not present

## 2020-06-19 DIAGNOSIS — D229 Melanocytic nevi, unspecified: Secondary | ICD-10-CM

## 2020-06-19 DIAGNOSIS — Z86018 Personal history of other benign neoplasm: Secondary | ICD-10-CM

## 2020-06-19 DIAGNOSIS — L578 Other skin changes due to chronic exposure to nonionizing radiation: Secondary | ICD-10-CM | POA: Diagnosis not present

## 2020-06-19 DIAGNOSIS — Z1283 Encounter for screening for malignant neoplasm of skin: Secondary | ICD-10-CM | POA: Diagnosis not present

## 2020-06-19 DIAGNOSIS — L814 Other melanin hyperpigmentation: Secondary | ICD-10-CM | POA: Diagnosis not present

## 2020-06-19 DIAGNOSIS — D485 Neoplasm of uncertain behavior of skin: Secondary | ICD-10-CM

## 2020-06-19 NOTE — Patient Instructions (Addendum)
Biopsy, Surgery (Curettage) & Surgery (Excision) Aftercare Instructions  1. Okay to remove bandage in 24 hours  2. Wash area with soap and water  3. Apply Vaseline to area twice daily until healed (Not Neosporin)  4. Okay to cover with a Band-Aid to decrease the chance of infection or prevent irritation from clothing; also it's okay to uncover lesion at home.  5. Suture instructions: return to our office in 7-10 or 10-14 days for a nurse visit for suture removal. Variable healing with sutures, if pain or itching occurs call our office. It's okay to shower or bathe 24 hours after sutures are given.  6. The following risks may occur after a biopsy, curettage or excision: bleeding, scarring, discoloration, recurrence, infection (redness, yellow drainage, pain or swelling).  7. For questions, concerns and results call our office at Monday-Thursday before 4pm & Friday before 3pm. Biopsy results will be available in 1 week.  Seborrheic Keratosis A seborrheic keratosis is a common, noncancerous (benign) skin growth. These growths are velvety, waxy, rough, tan, brown, or black spots that appear on the skin. These skin growths can be flat or raised, and scaly. What are the causes? The cause of this condition is not known. What increases the risk? You are more likely to develop this condition if you:  Have a family history of seborrheic keratosis.  Are 50 or older.  Are pregnant.  Have had estrogen replacement therapy. What are the signs or symptoms? Symptoms of this condition include growths on the face, chest, shoulders, back, or other areas. These growths:  Are usually painless, but may become irritated and itchy.  Can be yellow, brown, black, or other colors.  Are slightly raised or have a flat surface.  Are sometimes rough or wart-like in texture.  Are often velvety or waxy on the surface.  Are round or oval-shaped.  Often occur in groups, but may occur as a single growth.    How is this diagnosed? This condition is diagnosed with a medical history and physical exam.  A sample of the growth may be tested (skin biopsy).  You may need to see a skin specialist (dermatologist). How is this treated? Treatment is not usually needed for this condition, unless the growths are irritated or bleed often.  You may also choose to have the growths removed if you do not like their appearance. ? Most commonly, these growths are treated with a procedure in which liquid nitrogen is applied to "freeze" off the growth (cryosurgery). ? They may also be burned off with electricity (electrocautery) or removed by scraping (curettage). Follow these instructions at home:  Watch your growth for any changes.  Keep all follow-up visits as told by your health care provider. This is important.  Do not scratch or pick at the growth or growths. This can cause them to become irritated or infected. Contact a health care provider if:  You suddenly have many new growths.  Your growth bleeds, itches, or hurts.  Your growth suddenly becomes larger or changes color. Summary  A seborrheic keratosis is a common, noncancerous (benign) skin growth.  Treatment is not usually needed for this condition, unless the growths are irritated or bleed often.  Watch your growth for any changes.  Contact a health care provider if you suddenly have many new growths or your growth suddenly becomes larger or changes color.  Keep all follow-up visits as told by your health care provider. This is important. This information is not intended to replace advice   given to you by your health care provider. Make sure you discuss any questions you have with your health care provider. Document Revised: 05/26/2017 Document Reviewed: 05/26/2017 Elsevier Patient Education  2021 [employer] Inc.  

## 2020-06-19 NOTE — Progress Notes (Signed)
   Follow-Up Visit   Subjective  [name] is a 75 y.o. female who presents for the following: Annual Exam (Patient is here for skin exam. Patients concerns are face. Patient has lesion over right eye and a lesion on right cheek that's itchy and sometimes sore. History of atypical moles. ).   The following portions of the chart were reviewed this encounter and updated as appropriate:      Objective  Well appearing patient in no apparent distress; mood and affect are within normal limits.  A full examination was performed including scalp, head, eyes, ears, nose, lips, neck, chest, axillae, abdomen, back, buttocks, bilateral upper extremities, bilateral lower extremities, hands, feet, fingers, toes, fingernails, and toenails. All findings within normal limits unless otherwise noted below.  Objective  Right Buccal Cheek: Pink papule      Assessment & Plan  Neoplasm of uncertain behavior of skin Right Buccal Cheek  Skin / nail biopsy Type of biopsy: tangential   Informed consent: discussed and consent obtained   Timeout: patient name, date of birth, surgical site, and procedure verified   Procedure prep:  Patient was prepped and draped in usual sterile fashion (Non sterile) Prep type:  Chlorhexidine Anesthesia: the lesion was anesthetized in a standard fashion   Anesthetic:  1% lidocaine w/ epinephrine 1-100,000 local infiltration Instrument used: flexible razor blade   Outcome: patient tolerated procedure well   Post-procedure details: wound care instructions given    Specimen 1 - Surgical pathology Differential Diagnosis: bcc vs scc  Check Margins: No  Lentigines - Scattered tan macules - Discussed due to sun exposure - Benign, observe - Call for any changes  Seborrheic Keratoses - Stuck-on, waxy, tan-brown papules and plaques  - Discussed benign etiology and prognosis. - Observe - Call for any changes  Melanocytic Nevi - Tan-brown and/or  pink-flesh-colored symmetric macules and papules - Benign appearing on exam today - Observation - Call clinic for new or changing moles - Recommend daily use of broad spectrum spf 30+ sunscreen to sun-exposed areas.   Hemangiomas - Red papules - Discussed benign nature - Observe - Call for any changes  Actinic Damage - diffuse scaly erythematous macules with underlying dyspigmentation - Recommend daily broad spectrum sunscreen SPF 30+ to sun-exposed areas, reapply every 2 hours as needed.  - Call for new or changing lesions.  Skin cancer screening performed today. No atypical nevi identified.    I, Theona Muhs, PA-C, have reviewed all documentation's for this visit.  The documentation on 06/19/20 for the exam, diagnosis, procedures and orders are all accurate and complete.

## 2020-06-24 NOTE — Progress Notes (Signed)
The radiologist just got a copy of an old mammogram from 06/29/16, and they said since the oval mass is stable since 2018, it is likely benign, and they will repeat imaging in 1 year.

## 2020-06-30 ENCOUNTER — Encounter: Payer: Self-pay | Admitting: Nutrition

## 2020-06-30 ENCOUNTER — Telehealth: Payer: Self-pay | Admitting: Dermatology

## 2020-06-30 NOTE — Telephone Encounter (Signed)
Pathology to patient.  °

## 2020-06-30 NOTE — Telephone Encounter (Signed)
Patient is calling for pathology results from last visit with Stuart Tafeen, MD 

## 2020-07-01 ENCOUNTER — Ambulatory Visit (HOSPITAL_COMMUNITY): Payer: PPO

## 2020-07-01 ENCOUNTER — Encounter (HOSPITAL_COMMUNITY): Payer: PPO

## 2020-07-01 NOTE — Progress Notes (Signed)
Left breast U/S looks good. The mass they found is stable when compared to imaging from 2018, so it is benign.

## 2020-07-09 DIAGNOSIS — F3181 Bipolar II disorder: Secondary | ICD-10-CM | POA: Diagnosis not present

## 2020-07-17 ENCOUNTER — Ambulatory Visit: Payer: PPO | Admitting: Nutrition

## 2020-07-21 ENCOUNTER — Ambulatory Visit: Payer: PPO | Admitting: Internal Medicine

## 2020-07-30 ENCOUNTER — Other Ambulatory Visit: Payer: Self-pay | Admitting: Cardiology

## 2020-07-30 ENCOUNTER — Encounter: Payer: Self-pay | Admitting: Internal Medicine

## 2020-07-30 NOTE — Telephone Encounter (Signed)
Who is mom? Is Marvia the patient that needs the labs. Does she have diagnosis codes to go with those labs for insurance or is she ok paying out of pocket?

## 2020-07-31 NOTE — Telephone Encounter (Signed)
FYI Spoke with [name] daughter and she said this could wait rescheduled appt for 09-04-20 advised I would send this to provider however he is out of office and he will address if blood work is needed when he returns in august

## 2020-08-01 DIAGNOSIS — M1711 Unilateral primary osteoarthritis, right knee: Secondary | ICD-10-CM | POA: Diagnosis not present

## 2020-08-01 DIAGNOSIS — M1712 Unilateral primary osteoarthritis, left knee: Secondary | ICD-10-CM | POA: Diagnosis not present

## 2020-08-01 DIAGNOSIS — M170 Bilateral primary osteoarthritis of knee: Secondary | ICD-10-CM | POA: Diagnosis not present

## 2020-08-06 ENCOUNTER — Ambulatory Visit: Payer: PPO | Admitting: Internal Medicine

## 2020-08-11 ENCOUNTER — Ambulatory Visit (INDEPENDENT_AMBULATORY_CARE_PROVIDER_SITE_OTHER): Payer: PPO | Admitting: Gastroenterology

## 2020-08-18 ENCOUNTER — Encounter: Payer: Self-pay | Admitting: Nurse Practitioner

## 2020-08-19 ENCOUNTER — Other Ambulatory Visit: Payer: Self-pay

## 2020-08-19 ENCOUNTER — Encounter: Payer: Self-pay | Admitting: Nurse Practitioner

## 2020-08-19 ENCOUNTER — Ambulatory Visit (INDEPENDENT_AMBULATORY_CARE_PROVIDER_SITE_OTHER): Payer: PPO | Admitting: Nurse Practitioner

## 2020-08-19 VITALS — BP 123/69 | HR 76 | Temp 97.1°F | Ht 62.0 in | Wt 205.0 lb

## 2020-08-19 DIAGNOSIS — R3915 Urgency of urination: Secondary | ICD-10-CM | POA: Diagnosis not present

## 2020-08-19 DIAGNOSIS — R0600 Dyspnea, unspecified: Secondary | ICD-10-CM | POA: Diagnosis not present

## 2020-08-19 DIAGNOSIS — R350 Frequency of micturition: Secondary | ICD-10-CM

## 2020-08-19 DIAGNOSIS — D509 Iron deficiency anemia, unspecified: Secondary | ICD-10-CM

## 2020-08-19 DIAGNOSIS — R5383 Other fatigue: Secondary | ICD-10-CM

## 2020-08-19 DIAGNOSIS — R0609 Other forms of dyspnea: Secondary | ICD-10-CM

## 2020-08-19 DIAGNOSIS — E782 Mixed hyperlipidemia: Secondary | ICD-10-CM | POA: Diagnosis not present

## 2020-08-19 DIAGNOSIS — E559 Vitamin D deficiency, unspecified: Secondary | ICD-10-CM

## 2020-08-19 DIAGNOSIS — N3941 Urge incontinence: Secondary | ICD-10-CM | POA: Insufficient documentation

## 2020-08-19 DIAGNOSIS — R635 Abnormal weight gain: Secondary | ICD-10-CM | POA: Diagnosis not present

## 2020-08-19 DIAGNOSIS — Z139 Encounter for screening, unspecified: Secondary | ICD-10-CM | POA: Diagnosis not present

## 2020-08-19 DIAGNOSIS — E890 Postprocedural hypothyroidism: Secondary | ICD-10-CM | POA: Diagnosis not present

## 2020-08-19 LAB — POCT URINALYSIS DIPSTICK
Bilirubin, UA: NEGATIVE
Blood, UA: NEGATIVE
Glucose, UA: NEGATIVE
Ketones, UA: NEGATIVE
Leukocytes, UA: NEGATIVE
Nitrite, UA: NEGATIVE
Protein, UA: NEGATIVE
Spec Grav, UA: 1.01 (ref 1.010–1.025)
Urobilinogen, UA: 0.2 E.U./dL
pH, UA: 6 (ref 5.0–8.0)

## 2020-08-19 MED ORDER — ALBUTEROL SULFATE HFA 108 (90 BASE) MCG/ACT IN AERS
2.0000 | INHALATION_SPRAY | Freq: Four times a day (QID) | RESPIRATORY_TRACT | 0 refills | Status: DC | PRN
Start: 2020-08-19 — End: 2020-09-02

## 2020-08-19 NOTE — Patient Instructions (Signed)
Please have fasting labs drawn today or this week.

## 2020-08-19 NOTE — Assessment & Plan Note (Addendum)
-  checking labs 

## 2020-08-19 NOTE — Telephone Encounter (Signed)
Please set up an appointment.

## 2020-08-19 NOTE — Assessment & Plan Note (Signed)
-  will check Vit D with labs

## 2020-08-19 NOTE — Assessment & Plan Note (Signed)
-  checking labs today 

## 2020-08-19 NOTE — Assessment & Plan Note (Signed)
-  Rx. Albuterol; she states she has noticed wheezing -if no improvement in 1-2 weeks, she should follow-up with cardiology; she sees Dr.McDowell

## 2020-08-19 NOTE — Assessment & Plan Note (Signed)
Wt Readings from Last 3 Encounters:  08/19/20 205 lb (93 kg)  04/21/20 198 lb (89.8 kg)  04/07/20 194 lb (88 kg)   -up 7 pounds since last OV -checking thyroid function; has decreased activity since her left knee started hurting

## 2020-08-19 NOTE — Progress Notes (Signed)
Established Patient Office Visit  Subjective:  Patient ID: [name], female    DOB: 1946-01-10  Age: 75 y.o. MRN: [id number]  CC:  Chief Complaint  Patient presents with   Follow-up   Urinary Frequency    Ongoing x1 month    HPI [name] presents for lab follow-up.  She has urinary frequency and urgency for about the last month. She had recurrent UTIs and those resolved after Dr. Posey Pronto prescribed her something.  She states that she has been gaining weight recently.  She gets SOB with exertion that has been ongoing for about a month. She states that she has had some wheezing.  She sees Dr. Wynelle Link for left knee pain.  Pt's daughter sent a note saying: "Can you order a few labs for mom? These may be out of scope for routine follow up but hope to address some complaints of chronic fatigue and frequent urination  before we assume depression.  - full thyroid panel -not just TSH or t4 -need free T3, free t4,  TPO, T3, T4, TSH and reverse T3 -Vitamin D -Cortisol -Magnesium -Ferritin and total iron -A1C and glucose -CBC Cholesterol Thank you!"  Past Medical History:  Diagnosis Date   Allergic rhinitis    B12 deficiency    Bipolar disorder ([place])    Essential hypertension    GERD (gastroesophageal reflux disease)    Hashimoto's thyroiditis    History of transient ischemic attack (TIA)    Or possibly migraine as well as right amaurosis fugax and ataxia - Dr. Erling Cruz   Hyperlipidemia    PSVT (paroxysmal supraventricular tachycardia) [facility])     Past Surgical History:  Procedure Laterality Date   CATARACT EXTRACTION W/PHACO Left 05/23/2020   Procedure: CATARACT EXTRACTION PHACO AND INTRAOCULAR LENS PLACEMENT LEFT EYE;  Surgeon: Baruch Goldmann, MD;  Location: [facility];  Service: Ophthalmology;  Laterality: Left;  left CDE=15.69   CATARACT EXTRACTION W/PHACO Right 06/13/2020   Procedure: CATARACT EXTRACTION PHACO AND INTRAOCULAR LENS PLACEMENT RIGHT EYE;  Surgeon:  Baruch Goldmann, MD;  Location: [facility];  Service: Ophthalmology;  Laterality: Right;  right CDE=11.33   LAPAROSCOPIC CHOLECYSTECTOMY  2010   THYROIDECTOMY  2009    Family History  Problem Relation Age of Onset   Stroke Mother    Thyroid disease Mother    Depression Father    Hypertension Sister    Aneurysm Sister    Diabetes Brother    Heart attack Brother    Hypertension Brother    Heart failure Brother    Thyroid disease Brother    Stroke Maternal Grandmother    Heart failure Maternal Grandmother    Heart failure Maternal Grandfather    Stroke Maternal Grandfather    Heart failure Paternal Grandmother     Social History   Socioeconomic History   Marital status: Widowed    Spouse name: Not on file   Number of children: 2   Years of education: Not on file   Highest education level: Not on file  Occupational History   Occupation: homemaker    Employer: RETIRED  Tobacco Use   Smoking status: Former    Packs/day: 0.50    Years: 10.00    Pack years: 5.00    Types: Cigarettes    Quit date: 01/26/1983    Years since quitting: 37.5   Smokeless tobacco: Never   Tobacco comments:    quit 30 years ago  Vaping Use   Vaping Use: Never used  Substance  and Sexual Activity   Alcohol use: No    Alcohol/week: 0.0 standard drinks   Drug use: No   Sexual activity: Not Currently  Other Topics Concern   Not on file  Social History Narrative   Regular exercise: walk when able   Caffeine use: occasionally   Social Determinants of Health   Financial Resource Strain: Not on file  Food Insecurity: Not on file  Transportation Needs: Not on file  Physical Activity: Not on file  Stress: Not on file  Social Connections: Not on file  Intimate Partner Violence: Not on file    Outpatient Medications Prior to Visit  Medication Sig Dispense Refill   acetaminophen (TYLENOL) 500 MG tablet Take 1,000 mg by mouth every 6 (six) hours as needed for moderate pain or headache.      alendronate (FOSAMAX) 70 MG tablet Take 1 tablet (70 mg total) by mouth every 7 (seven) days. Take with a full glass of water on an empty stomach. 4 tablet 11   atorvastatin (LIPITOR) 40 MG tablet Take 1 tablet (40 mg total) by mouth daily. Take in the evening. (Patient taking differently: Take 40 mg by mouth every evening.) 90 tablet 3   buPROPion (WELLBUTRIN XL) 300 MG 24 hr tablet Take 300 mg by mouth daily.      calcium carbonate (TUMS - DOSED IN MG ELEMENTAL CALCIUM) 500 MG chewable tablet Chew 4 tablets by mouth daily.     Cholecalciferol (VITAMIN D3) 125 MCG (5000 UT) CAPS Take 5,000 Units by mouth daily.     clonazePAM (KLONOPIN) 0.5 MG tablet Take 0.25-0.5 mg by mouth in the morning, at noon, and at bedtime.     dexlansoprazole (DEXILANT) 60 MG capsule Take 60 mg by mouth daily.     Digestive Enzymes (SUPER ENZYMES PO) Take 1 tablet by mouth daily.     diltiazem (CARDIZEM CD) 240 MG 24 hr capsule TAKE (1) CAPSULE BY MOUTH ONCE DAILY. 90 capsule 3   DULoxetine (CYMBALTA) 60 MG capsule Take 60 mg by mouth 2 (two) times daily.      ferrous sulfate 325 (65 FE) MG tablet Take 325 mg by mouth daily with breakfast.     losartan (COZAAR) 50 MG tablet Take 1 tablet (50 mg total) by mouth in the morning and at bedtime. (Patient taking differently: Take 50 mg by mouth daily.) 60 tablet 1   LYBALVI 5-10 MG TABS Take 1 tablet by mouth at bedtime.     Multiple Vitamins-Minerals (ICAPS AREDS 2 PO) Take 1 capsule by mouth daily.     pantoprazole (PROTONIX) 40 MG tablet Take 40 mg by mouth daily before breakfast.     SYNTHROID 112 MCG tablet Take 112 mcg by mouth daily before breakfast.     Vitamin D-Vitamin K (VITAMIN K2-VITAMIN D3 PO) Take 1 tablet by mouth daily.     nitrofurantoin, macrocrystal-monohydrate, (MACROBID) 100 MG capsule Take 1 capsule (100 mg total) by mouth 2 (two) times daily. (Patient not taking: Reported on 08/19/2020) 10 capsule 0   No facility-administered medications prior to visit.     Allergies  Allergen Reactions   Amoxicillin-Pot Clavulanate Rash   Atenolol Rash   Penicillins Rash   Sulfonamide Derivatives Rash    ROS Review of Systems  Constitutional: Negative.   Respiratory:  Positive for shortness of breath and wheezing.   Cardiovascular: Negative.   Genitourinary:  Positive for frequency and urgency. Negative for flank pain.  Musculoskeletal:  Positive for arthralgias.  Left knee pain  Psychiatric/Behavioral: Negative.       Objective:    Physical Exam Constitutional:      Appearance: Normal appearance. She is obese.  Cardiovascular:     Rate and Rhythm: Normal rate and regular rhythm.     Pulses: Normal pulses.     Heart sounds: Normal heart sounds.  Pulmonary:     Effort: Pulmonary effort is normal.     Breath sounds: Normal breath sounds.  Musculoskeletal:     Comments: Cane for ambulation  Neurological:     Mental Status: She is alert.  Psychiatric:        Mood and Affect: Mood normal.        Behavior: Behavior normal.        Thought Content: Thought content normal.        Judgment: Judgment normal.    BP 123/69 (BP Location: Left Arm, Patient Position: Sitting, Cuff Size: Large)   Pulse 76   Temp (!) 97.1 F (36.2 C) (Temporal)   Ht _0  (1.575 m)   Wt 205 lb (93 kg)   SpO2 96%   BMI 37.49 kg/m  Wt Readings from Last 3 Encounters:  08/19/20 205 lb (93 kg)  04/21/20 198 lb (89.8 kg)  04/07/20 194 lb (88 kg)     Health Maintenance Due  Topic Date Due   Hepatitis C Screening  Never done   Zoster Vaccines- Shingrix (1 of 2) Never done   COLONOSCOPY (Pts 45-88yr Insurance coverage will need to be confirmed)  Never done   COVID-19 Vaccine (3 - Mixed Product risk series) 04/16/2019    There are no preventive care reminders to display for this patient.  Lab Results  Component Value Date   TSH 2.600 04/11/2020   Lab Results  Component Value Date   WBC 8.7 04/11/2020   HGB 14.5 04/11/2020   HCT 42.0  04/11/2020   MCV 94 04/11/2020   PLT 477 (H) 04/11/2020   Lab Results  Component Value Date   NA 138 04/11/2020   K 4.7 04/11/2020   CO2 22 04/11/2020   GLUCOSE 112 (H) 04/11/2020   BUN 8 04/11/2020   CREATININE 0.70 04/11/2020   BILITOT 0.4 04/11/2020   ALKPHOS 103 04/11/2020   AST 20 04/11/2020   ALT 19 04/11/2020   PROT 7.2 04/11/2020   ALBUMIN 4.6 04/11/2020   CALCIUM 9.8 04/11/2020   ANIONGAP 9 04/10/2020   EGFR 91 04/11/2020   Lab Results  Component Value Date   CHOL 262 (H) 04/11/2020   Lab Results  Component Value Date   HDL 83 04/11/2020   Lab Results  Component Value Date   LDLCALC 165 (H) 04/11/2020   Lab Results  Component Value Date   TRIG 82 04/11/2020   No results found for: CHOLHDL No results found for: HGBA1C    Assessment & Plan:   Problem List Items Addressed This Visit       Endocrine   Postoperative hypothyroidism    -check thyroid panel On synthroid 112 mcg       Relevant Orders   CMP14+EGFR   TSH+T4F+T3Free     Other   Hyperlipidemia    -checking labs today       Relevant Orders   CMP14+EGFR   Lipid Panel With LDL/HDL Ratio   Exertional dyspnea    -Rx. Albuterol; she states she has noticed wheezing -if no improvement in 1-2 weeks, she should follow-up with cardiology; she sees Dr.McDowell  Relevant Medications   albuterol (VENTOLIN HFA) 108 (90 Base) MCG/ACT inhaler   Urinary frequency - Primary   Relevant Orders   POCT Urinalysis Dipstick   CBC with Differential/Platelet   CMP14+EGFR   Urine Culture   Iron deficiency anemia    -checking labs       Relevant Orders   CBC with Differential/Platelet   Iron, TIBC and Ferritin Panel   Fatigue   Relevant Orders   Cortisol   Vitamin D deficiency    -will check Vit D with labs       Relevant Orders   VITAMIN D 25 Hydroxy (Vit-D Deficiency, Fractures)   Weight gain    Wt Readings from Last 3 Encounters:  08/19/20 205 lb (93 kg)  04/21/20 198 lb  (89.8 kg)  04/07/20 194 lb (88 kg)  -up 7 pounds since last OV -checking thyroid function; has decreased activity since her left knee started hurting      Relevant Orders   CMP14+EGFR   Cortisol   Urinary urgency    -ongoing x 1 month -U/A and urine culture today       Other Visit Diagnoses     Screening due       Relevant Orders   Hepatitis C antibody       Meds ordered this encounter  Medications   albuterol (VENTOLIN HFA) 108 (90 Base) MCG/ACT inhaler    Sig: Inhale 2 puffs into the lungs every 6 (six) hours as needed for wheezing or shortness of breath.    Dispense:  8 g    Refill:  0     Follow-up: Return in about 3 months (around 11/19/2020) for Lab follow-up (HTN, HLD, thyroid).    Noreene Larsson, NP

## 2020-08-19 NOTE — Telephone Encounter (Signed)
Appt was seen 07.26.2022

## 2020-08-19 NOTE — Assessment & Plan Note (Signed)
-  check thyroid panel On synthroid 112 mcg

## 2020-08-19 NOTE — Assessment & Plan Note (Addendum)
-  ongoing x 1 month -U/A was negative for leuks, blood, or nitrites  - will get urine culture

## 2020-08-20 ENCOUNTER — Ambulatory Visit (INDEPENDENT_AMBULATORY_CARE_PROVIDER_SITE_OTHER): Payer: PPO | Admitting: Gastroenterology

## 2020-08-20 DIAGNOSIS — E559 Vitamin D deficiency, unspecified: Secondary | ICD-10-CM | POA: Diagnosis not present

## 2020-08-20 DIAGNOSIS — D509 Iron deficiency anemia, unspecified: Secondary | ICD-10-CM | POA: Diagnosis not present

## 2020-08-20 DIAGNOSIS — R7301 Impaired fasting glucose: Secondary | ICD-10-CM | POA: Diagnosis not present

## 2020-08-20 DIAGNOSIS — Z139 Encounter for screening, unspecified: Secondary | ICD-10-CM | POA: Diagnosis not present

## 2020-08-20 DIAGNOSIS — E890 Postprocedural hypothyroidism: Secondary | ICD-10-CM | POA: Diagnosis not present

## 2020-08-20 DIAGNOSIS — R350 Frequency of micturition: Secondary | ICD-10-CM | POA: Diagnosis not present

## 2020-08-20 DIAGNOSIS — R5383 Other fatigue: Secondary | ICD-10-CM | POA: Diagnosis not present

## 2020-08-20 DIAGNOSIS — R635 Abnormal weight gain: Secondary | ICD-10-CM | POA: Diagnosis not present

## 2020-08-20 DIAGNOSIS — E782 Mixed hyperlipidemia: Secondary | ICD-10-CM | POA: Diagnosis not present

## 2020-08-21 ENCOUNTER — Other Ambulatory Visit: Payer: Self-pay | Admitting: Nurse Practitioner

## 2020-08-21 ENCOUNTER — Telehealth: Payer: Self-pay | Admitting: Nurse Practitioner

## 2020-08-21 DIAGNOSIS — E890 Postprocedural hypothyroidism: Secondary | ICD-10-CM

## 2020-08-21 LAB — IRON,TIBC AND FERRITIN PANEL
Ferritin: 53 ng/mL (ref 15–150)
Iron Saturation: 15 % (ref 15–55)
Iron: 57 ug/dL (ref 27–139)
Total Iron Binding Capacity: 387 ug/dL (ref 250–450)
UIBC: 330 ug/dL (ref 118–369)

## 2020-08-21 LAB — LIPID PANEL WITH LDL/HDL RATIO
Cholesterol, Total: 198 mg/dL (ref 100–199)
HDL: 83 mg/dL (ref >39)
LDL Chol Calc (NIH): 97 mg/dL (ref 0–99)
LDL/HDL Ratio: 1.2 ratio (ref 0.0–3.2)
Triglycerides: 103 mg/dL (ref 0–149)
VLDL Cholesterol Cal: 18 mg/dL (ref 5–40)

## 2020-08-21 LAB — CMP14+EGFR
ALT: 27 IU/L (ref 0–32)
AST: 18 IU/L (ref 0–40)
Albumin/Globulin Ratio: 2.1 (ref 1.2–2.2)
Albumin: 4.6 g/dL (ref 3.7–4.7)
Alkaline Phosphatase: 99 IU/L (ref 44–121)
BUN/Creatinine Ratio: 16 (ref 12–28)
BUN: 11 mg/dL (ref 8–27)
Bilirubin Total: 0.3 mg/dL (ref 0.0–1.2)
CO2: 23 mmol/L (ref 20–29)
Calcium: 9.3 mg/dL (ref 8.7–10.3)
Chloride: 98 mmol/L (ref 96–106)
Creatinine, Ser: 0.68 mg/dL (ref 0.57–1.00)
Globulin, Total: 2.2 g/dL (ref 1.5–4.5)
Glucose: 158 mg/dL — ABNORMAL HIGH (ref 65–99)
Potassium: 4.6 mmol/L (ref 3.5–5.2)
Sodium: 139 mmol/L (ref 134–144)
Total Protein: 6.8 g/dL (ref 6.0–8.5)
eGFR: 92 mL/min/{1.73_m2} (ref >59)

## 2020-08-21 LAB — HEPATITIS C ANTIBODY: Hep C Virus Ab: 0.2 s/co ratio (ref 0.0–0.9)

## 2020-08-21 LAB — TSH+T4F+T3FREE
Free T4: 0.99 ng/dL (ref 0.82–1.77)
T3, Free: 2.5 pg/mL (ref 2.0–4.4)
TSH: 10.4 u[IU]/mL — ABNORMAL HIGH (ref 0.450–4.500)

## 2020-08-21 LAB — VITAMIN D 25 HYDROXY (VIT D DEFICIENCY, FRACTURES): Vit D, 25-Hydroxy: 43.6 ng/mL (ref 30.0–100.0)

## 2020-08-21 LAB — CBC WITH DIFFERENTIAL/PLATELET
Basophils Absolute: 0.1 10*3/uL (ref 0.0–0.2)
Basos: 1 %
EOS (ABSOLUTE): 0.3 10*3/uL (ref 0.0–0.4)
Eos: 3 %
Hematocrit: 41.4 % (ref 34.0–46.6)
Hemoglobin: 13.7 g/dL (ref 11.1–15.9)
Immature Grans (Abs): 0.1 10*3/uL (ref 0.0–0.1)
Immature Granulocytes: 1 %
Lymphocytes Absolute: 1.1 10*3/uL (ref 0.7–3.1)
Lymphs: 10 %
MCH: 32.2 pg (ref 26.6–33.0)
MCHC: 33.1 g/dL (ref 31.5–35.7)
MCV: 97 fL (ref 79–97)
Monocytes Absolute: 0.9 10*3/uL (ref 0.1–0.9)
Monocytes: 9 %
Neutrophils Absolute: 8.3 10*3/uL — ABNORMAL HIGH (ref 1.4–7.0)
Neutrophils: 76 %
Platelets: 434 10*3/uL (ref 150–450)
RBC: 4.25 x10E6/uL (ref 3.77–5.28)
RDW: 12.3 % (ref 11.7–15.4)
WBC: 10.7 10*3/uL (ref 3.4–10.8)

## 2020-08-21 LAB — CORTISOL: Cortisol: 21.3 ug/dL

## 2020-08-21 MED ORDER — LEVOTHYROXINE SODIUM 125 MCG PO TABS
125.0000 ug | ORAL_TABLET | Freq: Every day | ORAL | 3 refills | Status: DC
Start: 2020-08-21 — End: 2020-08-21

## 2020-08-21 MED ORDER — LEVOTHYROXINE SODIUM 125 MCG PO TABS
125.0000 ug | ORAL_TABLET | Freq: Every day | ORAL | 3 refills | Status: DC
Start: 2020-08-21 — End: 2020-10-31

## 2020-08-21 NOTE — Progress Notes (Signed)
Blood sugar and TSH are elevated. I am adding A1c to labs to check for diabetes. TSH is elevated, so I am increasing synthroid to 125 mcg.

## 2020-08-21 NOTE — Telephone Encounter (Signed)
See lab note. Pt informed.

## 2020-08-21 NOTE — Telephone Encounter (Signed)
Patient missed her call from [place] to go over her blood work.  Please call her back at [phone]

## 2020-08-22 LAB — URINE CULTURE: Organism ID, Bacteria: NO GROWTH

## 2020-08-22 NOTE — Progress Notes (Signed)
No growth on urine culture.

## 2020-08-27 ENCOUNTER — Encounter: Payer: Self-pay | Admitting: Nurse Practitioner

## 2020-08-28 LAB — HEMOGLOBIN A1C
Est. average glucose Bld gHb Est-mCnc: 131 mg/dL
Hgb A1c MFr Bld: 6.2 % — ABNORMAL HIGH (ref 4.8–5.6)

## 2020-08-28 LAB — SPECIMEN STATUS REPORT

## 2020-08-28 NOTE — Progress Notes (Signed)
A1c is 6.2, so no need to add or change medicines.

## 2020-09-01 ENCOUNTER — Other Ambulatory Visit: Payer: Self-pay | Admitting: Nurse Practitioner

## 2020-09-01 DIAGNOSIS — R0600 Dyspnea, unspecified: Secondary | ICD-10-CM

## 2020-09-01 DIAGNOSIS — R0609 Other forms of dyspnea: Secondary | ICD-10-CM

## 2020-09-04 ENCOUNTER — Ambulatory Visit: Payer: PPO | Admitting: Internal Medicine

## 2020-09-11 ENCOUNTER — Encounter: Payer: Self-pay | Admitting: Nurse Practitioner

## 2020-09-12 ENCOUNTER — Other Ambulatory Visit: Payer: Self-pay | Admitting: Nurse Practitioner

## 2020-09-12 MED ORDER — NIRMATRELVIR/RITONAVIR (PAXLOVID)TABLET: 3.0000 | ORAL_TABLET | Freq: Two times a day (BID) | ORAL | 0 refills | Status: AC

## 2020-09-12 NOTE — Progress Notes (Signed)
Pt had positive COVID test at home. -Rx. paxlovid

## 2020-09-12 NOTE — Telephone Encounter (Signed)
I sent it in. I hope she feels better.

## 2020-09-30 ENCOUNTER — Encounter: Payer: Self-pay | Admitting: Nurse Practitioner

## 2020-09-30 ENCOUNTER — Other Ambulatory Visit: Payer: Self-pay

## 2020-09-30 ENCOUNTER — Ambulatory Visit (INDEPENDENT_AMBULATORY_CARE_PROVIDER_SITE_OTHER): Payer: PPO | Admitting: Nurse Practitioner

## 2020-09-30 VITALS — BP 135/61 | HR 78 | Temp 98.5°F | Ht 62.0 in | Wt 201.0 lb

## 2020-09-30 DIAGNOSIS — N644 Mastodynia: Secondary | ICD-10-CM | POA: Insufficient documentation

## 2020-09-30 DIAGNOSIS — R3915 Urgency of urination: Secondary | ICD-10-CM | POA: Diagnosis not present

## 2020-09-30 DIAGNOSIS — R350 Frequency of micturition: Secondary | ICD-10-CM

## 2020-09-30 MED ORDER — MIRABEGRON ER 25 MG PO TB24
25.0000 mg | ORAL_TABLET | Freq: Every day | ORAL | 1 refills | Status: DC
Start: 2020-09-30 — End: 2020-11-05

## 2020-09-30 NOTE — Assessment & Plan Note (Signed)
-  improved from last week -no mass palpated, had recent negative mammogram -discussed taking up to 2000 mg of tylenol daily -will use watchful waiting, RTC if pain gets worse over the next 1-2 weeks

## 2020-09-30 NOTE — Assessment & Plan Note (Addendum)
Lab Results  Component Value Date   HGBA1C 6.2 (H) 08/20/2020   -prediabetic, but not actually diabetic according to recent labs; will consider BMET if no improvement with myrbetriq -Rx. Myrbetriq; will treat OAB

## 2020-09-30 NOTE — Progress Notes (Signed)
Acute Office Visit  Subjective:    Patient ID: [name], female    DOB: 02-Sep-1945, 75 y.o.   MRN: [id number]  Chief Complaint  Patient presents with   Breast Pain    L breast sharp shooting pain last week, one episode. Is now aching and feels sensitive.      HPI Patient is in today for left breast pain. She had sharp pain last week x1 episode, and has had ongoing aching and sensitivity. She states her left breast feels heavy.  She had recent (May 2020) mammogram and "The oval mass in the slightly inner left breast is stable when compared to prior outside mammograms from 06/29/2016. Given this long-term stability this is considered benign."  She sees [facility] for GYN in [place], but hasn't seen her in a while.  Past Medical History:  Diagnosis Date   Allergic rhinitis    B12 deficiency    Bipolar disorder ([place])    Essential hypertension    GERD (gastroesophageal reflux disease)    Hashimoto's thyroiditis    History of transient ischemic attack (TIA)    Or possibly migraine as well as right amaurosis fugax and ataxia - Dr. Erling Cruz   Hyperlipidemia    PSVT (paroxysmal supraventricular tachycardia) [facility])     Past Surgical History:  Procedure Laterality Date   CATARACT EXTRACTION W/PHACO Left 05/23/2020   Procedure: CATARACT EXTRACTION PHACO AND INTRAOCULAR LENS PLACEMENT LEFT EYE;  Surgeon: Baruch Goldmann, MD;  Location: [facility];  Service: Ophthalmology;  Laterality: Left;  left CDE=15.69   CATARACT EXTRACTION W/PHACO Right 06/13/2020   Procedure: CATARACT EXTRACTION PHACO AND INTRAOCULAR LENS PLACEMENT RIGHT EYE;  Surgeon: Baruch Goldmann, MD;  Location: [facility];  Service: Ophthalmology;  Laterality: Right;  right CDE=11.33   LAPAROSCOPIC CHOLECYSTECTOMY  2010   THYROIDECTOMY  2009    Family History  Problem Relation Age of Onset   Stroke Mother    Thyroid disease Mother    Depression Father    Hypertension Sister    Aneurysm Sister    Diabetes Brother    Heart  attack Brother    Hypertension Brother    Heart failure Brother    Thyroid disease Brother    Stroke Maternal Grandmother    Heart failure Maternal Grandmother    Heart failure Maternal Grandfather    Stroke Maternal Grandfather    Heart failure Paternal Grandmother     Social History   Socioeconomic History   Marital status: Widowed    Spouse name: Not on file   Number of children: 2   Years of education: Not on file   Highest education level: Not on file  Occupational History   Occupation: homemaker    Employer: RETIRED  Tobacco Use   Smoking status: Former    Packs/day: 0.50    Years: 10.00    Pack years: 5.00    Types: Cigarettes    Quit date: 01/26/1983    Years since quitting: 37.7   Smokeless tobacco: Never   Tobacco comments:    quit 30 years ago  Vaping Use   Vaping Use: Never used  Substance and Sexual Activity   Alcohol use: No    Alcohol/week: 0.0 standard drinks   Drug use: No   Sexual activity: Not Currently  Other Topics Concern   Not on file  Social History Narrative   Regular exercise: walk when able   Caffeine use: occasionally   Social Determinants of Health   Financial Resource Strain:  Not on file  Food Insecurity: Not on file  Transportation Needs: Not on file  Physical Activity: Not on file  Stress: Not on file  Social Connections: Not on file  Intimate Partner Violence: Not on file    Outpatient Medications Prior to Visit  Medication Sig Dispense Refill   acetaminophen (TYLENOL) 500 MG tablet Take 1,000 mg by mouth every 6 (six) hours as needed for moderate pain or headache.     albuterol (VENTOLIN HFA) 108 (90 Base) MCG/ACT inhaler Inhale 2 puffs into the lungs every 6 (six) hours as needed for wheezing or shortness of breath. 18 g 0   alendronate (FOSAMAX) 70 MG tablet Take 1 tablet (70 mg total) by mouth every 7 (seven) days. Take with a full glass of water on an empty stomach. 4 tablet 11   atorvastatin (LIPITOR) 40 MG tablet Take  1 tablet (40 mg total) by mouth daily. Take in the evening. (Patient taking differently: Take 40 mg by mouth every evening.) 90 tablet 3   buPROPion (WELLBUTRIN XL) 300 MG 24 hr tablet Take 300 mg by mouth daily.      calcium carbonate (TUMS - DOSED IN MG ELEMENTAL CALCIUM) 500 MG chewable tablet Chew 4 tablets by mouth daily.     Cholecalciferol (VITAMIN D3) 125 MCG (5000 UT) CAPS Take 5,000 Units by mouth daily.     clonazePAM (KLONOPIN) 0.5 MG tablet Take 0.25-0.5 mg by mouth in the morning, at noon, and at bedtime.     dexlansoprazole (DEXILANT) 60 MG capsule Take 60 mg by mouth daily.     Digestive Enzymes (SUPER ENZYMES PO) Take 1 tablet by mouth daily.     diltiazem (CARDIZEM CD) 240 MG 24 hr capsule TAKE (1) CAPSULE BY MOUTH ONCE DAILY. 90 capsule 3   DULoxetine (CYMBALTA) 60 MG capsule Take 60 mg by mouth 2 (two) times daily.      ferrous sulfate 325 (65 FE) MG tablet Take 325 mg by mouth daily with breakfast.     levothyroxine (SYNTHROID) 125 MCG tablet Take 1 tablet (125 mcg total) by mouth daily. 90 tablet 3   losartan (COZAAR) 50 MG tablet Take 1 tablet (50 mg total) by mouth in the morning and at bedtime. (Patient taking differently: Take 50 mg by mouth daily.) 60 tablet 1   LYBALVI 5-10 MG TABS Take 1 tablet by mouth at bedtime.     Multiple Vitamins-Minerals (ICAPS AREDS 2 PO) Take 1 capsule by mouth daily.     pantoprazole (PROTONIX) 40 MG tablet Take 40 mg by mouth daily before breakfast.     Vitamin D-Vitamin K (VITAMIN K2-VITAMIN D3 PO) Take 1 tablet by mouth daily.     No facility-administered medications prior to visit.    Allergies  Allergen Reactions   Amoxicillin-Pot Clavulanate Rash   Atenolol Rash   Penicillins Rash   Sulfonamide Derivatives Rash    Review of Systems  Constitutional: Negative.   Skin:        Left breast pain per HPI.      Objective:    Physical Exam Exam conducted with a chaperone present Dietrich Pates, LPN).  Constitutional:       Appearance: Normal appearance.  Chest:  Breasts:    Right: Normal.     Left: Tenderness present.    Lymphadenopathy:     Upper Body:     Right upper body: No supraclavicular, axillary or pectoral adenopathy.     Left upper body: No supraclavicular, axillary or pectoral  adenopathy.  Neurological:     Mental Status: She is alert.    BP 135/61 (BP Location: Left Arm, Patient Position: Sitting, Cuff Size: Large)   Pulse 78   Temp 98.5 F (36.9 C) (Oral)   Ht '5\' 2"'  (1.575 m)   Wt 201 lb (91.2 kg)   SpO2 96%   BMI 36.76 kg/m  Wt Readings from Last 3 Encounters:  09/30/20 201 lb (91.2 kg)  08/19/20 205 lb (93 kg)  04/21/20 198 lb (89.8 kg)    Health Maintenance Due  Topic Date Due   Zoster Vaccines- Shingrix (1 of 2) Never done   COLONOSCOPY (Pts 45-72yr Insurance coverage will need to be confirmed)  Never done   INFLUENZA VACCINE  08/25/2020    There are no preventive care reminders to display for this patient.   Lab Results  Component Value Date   TSH 10.400 (H) 08/20/2020   Lab Results  Component Value Date   WBC 10.7 08/20/2020   HGB 13.7 08/20/2020   HCT 41.4 08/20/2020   MCV 97 08/20/2020   PLT 434 08/20/2020   Lab Results  Component Value Date   NA 139 08/20/2020   K 4.6 08/20/2020   CO2 23 08/20/2020   GLUCOSE 158 (H) 08/20/2020   BUN 11 08/20/2020   CREATININE 0.68 08/20/2020   BILITOT 0.3 08/20/2020   ALKPHOS 99 08/20/2020   AST 18 08/20/2020   ALT 27 08/20/2020   PROT 6.8 08/20/2020   ALBUMIN 4.6 08/20/2020   CALCIUM 9.3 08/20/2020   ANIONGAP 9 04/10/2020   EGFR 92 08/20/2020   Lab Results  Component Value Date   CHOL 198 08/20/2020   Lab Results  Component Value Date   HDL 83 08/20/2020   Lab Results  Component Value Date   LDLCALC 97 08/20/2020   Lab Results  Component Value Date   TRIG 103 08/20/2020   No results found for: CHOLHDL Lab Results  Component Value Date   HGBA1C 6.2 (H) 08/20/2020       Assessment & Plan:    Problem List Items Addressed This Visit       Other   Urinary frequency - Primary   Relevant Medications   mirabegron ER (MYRBETRIQ) 25 MG TB24 tablet   Urinary urgency    Lab Results  Component Value Date   HGBA1C 6.2 (H) 08/20/2020  -prediabetic, but not actually diabetic according to recent labs; will consider BMET if no improvement with myrbetriq -Rx. Myrbetriq; will treat OAB      Breast pain, left    -improved from last week -no mass palpated, had recent negative mammogram -discussed taking up to 2000 mg of tylenol daily -will use watchful waiting, RTC if pain gets worse over the next 1-2 weeks        Meds ordered this encounter  Medications   mirabegron ER (MYRBETRIQ) 25 MG TB24 tablet    Sig: Take 1 tablet (25 mg total) by mouth daily.    Dispense:  30 tablet    Refill:  [place] NP

## 2020-10-03 ENCOUNTER — Telehealth: Payer: Self-pay | Admitting: Nurse Practitioner

## 2020-10-03 NOTE — Telephone Encounter (Signed)
Pt LVM that she is wanting to know what the reason was why she was urinating so much, she doesn't remember you telling her in the visit

## 2020-10-06 NOTE — Telephone Encounter (Signed)
Pt was taking the wrong medication her daughter found the error, and she is straight now

## 2020-10-06 NOTE — Telephone Encounter (Signed)
We are treating overactive bladder. If the medicine doesn't help, she should let us know and we will get a urology referral for them to do diagnostic tests related to her bladder.

## 2020-10-07 ENCOUNTER — Ambulatory Visit: Payer: PPO | Admitting: Nurse Practitioner

## 2020-10-10 ENCOUNTER — Telehealth: Payer: Self-pay | Admitting: Nurse Practitioner

## 2020-10-10 NOTE — Telephone Encounter (Signed)
  Left message for patient to call back and schedule Medicare Annual Wellness Visit (AWV) in office.   If unable to come into the office for AWV,  please offer to do virtually or by telephone.  No hx of AWV eligible for AWVI as of 08/25/2012  Please schedule at anytime with [place].      40 Minutes appointment   Any questions, please call me at [phone]

## 2020-10-16 ENCOUNTER — Ambulatory Visit: Payer: PPO | Admitting: Cardiology

## 2020-10-30 ENCOUNTER — Encounter: Payer: Self-pay | Admitting: Nurse Practitioner

## 2020-10-30 ENCOUNTER — Other Ambulatory Visit: Payer: Self-pay

## 2020-10-30 ENCOUNTER — Ambulatory Visit (INDEPENDENT_AMBULATORY_CARE_PROVIDER_SITE_OTHER): Payer: PPO | Admitting: Nurse Practitioner

## 2020-10-30 VITALS — BP 131/76 | HR 79 | Ht 61.0 in | Wt 202.1 lb

## 2020-10-30 DIAGNOSIS — N644 Mastodynia: Secondary | ICD-10-CM

## 2020-10-30 DIAGNOSIS — Z1231 Encounter for screening mammogram for malignant neoplasm of breast: Secondary | ICD-10-CM

## 2020-10-30 DIAGNOSIS — I10 Essential (primary) hypertension: Secondary | ICD-10-CM

## 2020-10-30 DIAGNOSIS — E782 Mixed hyperlipidemia: Secondary | ICD-10-CM | POA: Diagnosis not present

## 2020-10-30 DIAGNOSIS — Z23 Encounter for immunization: Secondary | ICD-10-CM | POA: Diagnosis not present

## 2020-10-30 DIAGNOSIS — R3915 Urgency of urination: Secondary | ICD-10-CM

## 2020-10-30 DIAGNOSIS — D509 Iron deficiency anemia, unspecified: Secondary | ICD-10-CM | POA: Diagnosis not present

## 2020-10-30 DIAGNOSIS — E890 Postprocedural hypothyroidism: Secondary | ICD-10-CM | POA: Diagnosis not present

## 2020-10-30 DIAGNOSIS — Z1211 Encounter for screening for malignant neoplasm of colon: Secondary | ICD-10-CM | POA: Diagnosis not present

## 2020-10-30 NOTE — Progress Notes (Signed)
Acute Office Visit  Subjective:    Patient ID: [name], female    DOB: May 26, 1945, 75 y.o.   MRN: [id number]  Chief Complaint  Patient presents with   Follow-up    3 month follow up questions about colonoscopy and 3d mammo excessive urination      HPI Patient is in today for lab follow-up for HTN, anemia, prediabetes, and HLD.  She was prescribed myrbetriq at her last OV, but she states she is having more accidents, and myrbetriq isn't helping much.  We changed her thyroid labs at her last visit, so she will get labs rechecked.  Past Medical History:  Diagnosis Date   Allergic rhinitis    B12 deficiency    Bipolar disorder ([place])    Essential hypertension    GERD (gastroesophageal reflux disease)    Hashimoto's thyroiditis    History of transient ischemic attack (TIA)    Or possibly migraine as well as right amaurosis fugax and ataxia - Dr. Erling Cruz   Hyperlipidemia    PSVT (paroxysmal supraventricular tachycardia) [facility])     Past Surgical History:  Procedure Laterality Date   CATARACT EXTRACTION W/PHACO Left 05/23/2020   Procedure: CATARACT EXTRACTION PHACO AND INTRAOCULAR LENS PLACEMENT LEFT EYE;  Surgeon: Baruch Goldmann, MD;  Location: [facility];  Service: Ophthalmology;  Laterality: Left;  left CDE=15.69   CATARACT EXTRACTION W/PHACO Right 06/13/2020   Procedure: CATARACT EXTRACTION PHACO AND INTRAOCULAR LENS PLACEMENT RIGHT EYE;  Surgeon: Baruch Goldmann, MD;  Location: [facility];  Service: Ophthalmology;  Laterality: Right;  right CDE=11.33   LAPAROSCOPIC CHOLECYSTECTOMY  2010   THYROIDECTOMY  2009    Family History  Problem Relation Age of Onset   Stroke Mother    Thyroid disease Mother    Depression Father    Hypertension Sister    Aneurysm Sister    Diabetes Brother    Heart attack Brother    Hypertension Brother    Heart failure Brother    Thyroid disease Brother    Stroke Maternal Grandmother    Heart failure Maternal Grandmother    Heart failure  Maternal Grandfather    Stroke Maternal Grandfather    Heart failure Paternal Grandmother     Social History   Socioeconomic History   Marital status: Widowed    Spouse name: Not on file   Number of children: 2   Years of education: Not on file   Highest education level: Not on file  Occupational History   Occupation: homemaker    Employer: RETIRED  Tobacco Use   Smoking status: Former    Packs/day: 0.50    Years: 10.00    Pack years: 5.00    Types: Cigarettes    Quit date: 01/26/1983    Years since quitting: 37.7   Smokeless tobacco: Never   Tobacco comments:    quit 30 years ago  Vaping Use   Vaping Use: Never used  Substance and Sexual Activity   Alcohol use: No    Alcohol/week: 0.0 standard drinks   Drug use: No   Sexual activity: Not Currently  Other Topics Concern   Not on file  Social History Narrative   Regular exercise: walk when able   Caffeine use: occasionally   Social Determinants of Health   Financial Resource Strain: Not on file  Food Insecurity: Not on file  Transportation Needs: Not on file  Physical Activity: Not on file  Stress: Not on file  Social Connections: Not on file  Intimate  Partner Violence: Not on file    Outpatient Medications Prior to Visit  Medication Sig Dispense Refill   acetaminophen (TYLENOL) 500 MG tablet Take 1,000 mg by mouth every 6 (six) hours as needed for moderate pain or headache.     albuterol (VENTOLIN HFA) 108 (90 Base) MCG/ACT inhaler Inhale 2 puffs into the lungs every 6 (six) hours as needed for wheezing or shortness of breath. 18 g 0   alendronate (FOSAMAX) 70 MG tablet Take 1 tablet (70 mg total) by mouth every 7 (seven) days. Take with a full glass of water on an empty stomach. 4 tablet 11   atorvastatin (LIPITOR) 40 MG tablet Take 1 tablet (40 mg total) by mouth daily. Take in the evening. (Patient taking differently: Take 40 mg by mouth every evening.) 90 tablet 3   buPROPion (WELLBUTRIN XL) 300 MG 24 hr  tablet Take 300 mg by mouth daily.      calcium carbonate (TUMS - DOSED IN MG ELEMENTAL CALCIUM) 500 MG chewable tablet Chew 4 tablets by mouth daily.     Cholecalciferol (VITAMIN D3) 125 MCG (5000 UT) CAPS Take 5,000 Units by mouth daily.     clonazePAM (KLONOPIN) 0.5 MG tablet Take 0.25-0.5 mg by mouth in the morning, at noon, and at bedtime.     dexlansoprazole (DEXILANT) 60 MG capsule Take 60 mg by mouth daily.     Digestive Enzymes (SUPER ENZYMES PO) Take 1 tablet by mouth daily.     diltiazem (CARDIZEM CD) 240 MG 24 hr capsule TAKE (1) CAPSULE BY MOUTH ONCE DAILY. 90 capsule 3   DULoxetine (CYMBALTA) 60 MG capsule Take 60 mg by mouth 2 (two) times daily.      ferrous sulfate 325 (65 FE) MG tablet Take 325 mg by mouth daily with breakfast.     levothyroxine (SYNTHROID) 125 MCG tablet Take 1 tablet (125 mcg total) by mouth daily. 90 tablet 3   losartan (COZAAR) 50 MG tablet Take 1 tablet (50 mg total) by mouth in the morning and at bedtime. (Patient taking differently: Take 50 mg by mouth daily.) 60 tablet 1   LYBALVI 5-10 MG TABS Take 1 tablet by mouth at bedtime.     mirabegron ER (MYRBETRIQ) 25 MG TB24 tablet Take 1 tablet (25 mg total) by mouth daily. 30 tablet 1   Multiple Vitamins-Minerals (ICAPS AREDS 2 PO) Take 1 capsule by mouth daily.     pantoprazole (PROTONIX) 40 MG tablet Take 40 mg by mouth daily before breakfast.     Vitamin D-Vitamin K (VITAMIN K2-VITAMIN D3 PO) Take 1 tablet by mouth daily.     No facility-administered medications prior to visit.    Allergies  Allergen Reactions   Amoxicillin-Pot Clavulanate Rash   Atenolol Rash   Penicillins Rash   Sulfonamide Derivatives Rash    Review of Systems     Objective:    Physical Exam  BP 131/76 (BP Location: Right Arm, Patient Position: Sitting, Cuff Size: Large)   Pulse 79   Ht '5\' 1"'  (1.549 m)   Wt 202 lb 1.3 oz (91.7 kg)   SpO2 95%   BMI 38.18 kg/m  Wt Readings from Last 3 Encounters:  10/30/20 202 lb 1.3  oz (91.7 kg)  09/30/20 201 lb (91.2 kg)  08/19/20 205 lb (93 kg)    Health Maintenance Due  Topic Date Due   Zoster Vaccines- Shingrix (1 of 2) Never done   COLONOSCOPY (Pts 45-56yr Insurance coverage will need to be confirmed)  Never done   COVID-19 Vaccine (3 - Mixed Product risk series) 04/16/2019    There are no preventive care reminders to display for this patient.   Lab Results  Component Value Date   TSH 10.400 (H) 08/20/2020   Lab Results  Component Value Date   WBC 10.7 08/20/2020   HGB 13.7 08/20/2020   HCT 41.4 08/20/2020   MCV 97 08/20/2020   PLT 434 08/20/2020   Lab Results  Component Value Date   NA 139 08/20/2020   K 4.6 08/20/2020   CO2 23 08/20/2020   GLUCOSE 158 (H) 08/20/2020   BUN 11 08/20/2020   CREATININE 0.68 08/20/2020   BILITOT 0.3 08/20/2020   ALKPHOS 99 08/20/2020   AST 18 08/20/2020   ALT 27 08/20/2020   PROT 6.8 08/20/2020   ALBUMIN 4.6 08/20/2020   CALCIUM 9.3 08/20/2020   ANIONGAP 9 04/10/2020   EGFR 92 08/20/2020   Lab Results  Component Value Date   CHOL 198 08/20/2020   Lab Results  Component Value Date   HDL 83 08/20/2020   Lab Results  Component Value Date   LDLCALC 97 08/20/2020   Lab Results  Component Value Date   TRIG 103 08/20/2020   No results found for: CHOLHDL Lab Results  Component Value Date   HGBA1C 6.2 (H) 08/20/2020       Assessment & Plan:   Problem List Items Addressed This Visit       Cardiovascular and Mediastinum   Essential hypertension    BP Readings from Last 3 Encounters:  10/30/20 131/76  09/30/20 135/61  08/19/20 123/69  -well controlled         Endocrine   Postoperative hypothyroidism    -at her last OV, we increased her levothyroxine to 125 mcg -check thyroid panel      Relevant Orders   CMP14+EGFR   TSH + free T4     Other   Hyperlipidemia    -check lipid panel with future labs; was < 3 months since last draw      Relevant Orders   CMP14+EGFR   Iron  deficiency anemia    Lab Results  Component Value Date   IRON 57 08/20/2020   TIBC 387 08/20/2020   FERRITIN 53 08/20/2020  -iron panel was great on last lab check; will check iron panel in the future, but not with the current lab draw      Relevant Orders   CBC with Differential/Platelet   Urinary urgency    -no improvement with myrbetriq -referral to urology      Relevant Orders   Ambulatory referral to Urology   Breast pain, left    -she states her breast doesn't feel right, but she had diagnostic mammogram and ultrasound in May that were negative; said to have screening mammogram in 1 year, she would like 3D mammo at that time -check CBC and CMP -ordered 3D mammo for May 2023      Other Visit Diagnoses     Colon cancer screening    -  Primary   Relevant Orders   Cologuard   Screening mammogram for breast cancer       Relevant Orders   MM Digital Screening        No orders of the defined types were placed in this encounter.    Noreene Larsson, NP

## 2020-10-30 NOTE — Assessment & Plan Note (Signed)
-  no improvement with myrbetriq -referral to urology

## 2020-10-30 NOTE — Assessment & Plan Note (Signed)
-  she states her breast doesn't feel right, but she had diagnostic mammogram and ultrasound in May that were negative; said to have screening mammogram in 1 year, she would like 3D mammo at that time -check CBC and CMP -ordered 3D mammo for May 2023

## 2020-10-30 NOTE — Assessment & Plan Note (Signed)
BP Readings from Last 3 Encounters:  10/30/20 131/76  09/30/20 135/61  08/19/20 123/69   -well controlled

## 2020-10-30 NOTE — Assessment & Plan Note (Signed)
-  at her last OV, we increased her levothyroxine to 125 mcg -check thyroid panel

## 2020-10-30 NOTE — Assessment & Plan Note (Addendum)
-  check lipid panel with future labs; was < 3 months since last draw

## 2020-10-30 NOTE — Assessment & Plan Note (Signed)
Lab Results  Component Value Date   IRON 57 08/20/2020   TIBC 387 08/20/2020   FERRITIN 53 08/20/2020   -iron panel was great on last lab check; will check iron panel in the future, but not with the current lab draw

## 2020-10-30 NOTE — Patient Instructions (Signed)
Please have labs drawn today

## 2020-10-31 ENCOUNTER — Other Ambulatory Visit: Payer: Self-pay | Admitting: Nurse Practitioner

## 2020-10-31 DIAGNOSIS — E890 Postprocedural hypothyroidism: Secondary | ICD-10-CM

## 2020-10-31 DIAGNOSIS — M25562 Pain in left knee: Secondary | ICD-10-CM | POA: Diagnosis not present

## 2020-10-31 DIAGNOSIS — M25561 Pain in right knee: Secondary | ICD-10-CM | POA: Diagnosis not present

## 2020-10-31 LAB — CBC WITH DIFFERENTIAL/PLATELET
Basophils Absolute: 0 10*3/uL (ref 0.0–0.2)
Basos: 0 %
EOS (ABSOLUTE): 0.2 10*3/uL (ref 0.0–0.4)
Eos: 2 %
Hematocrit: 38.5 % (ref 34.0–46.6)
Hemoglobin: 12.8 g/dL (ref 11.1–15.9)
Immature Grans (Abs): 0 10*3/uL (ref 0.0–0.1)
Immature Granulocytes: 0 %
Lymphocytes Absolute: 1.7 10*3/uL (ref 0.7–3.1)
Lymphs: 17 %
MCH: 31.9 pg (ref 26.6–33.0)
MCHC: 33.2 g/dL (ref 31.5–35.7)
MCV: 96 fL (ref 79–97)
Monocytes Absolute: 1 10*3/uL — ABNORMAL HIGH (ref 0.1–0.9)
Monocytes: 10 %
Neutrophils Absolute: 7.2 10*3/uL — ABNORMAL HIGH (ref 1.4–7.0)
Neutrophils: 71 %
Platelets: 418 10*3/uL (ref 150–450)
RBC: 4.01 x10E6/uL (ref 3.77–5.28)
RDW: 12.5 % (ref 11.7–15.4)
WBC: 10.2 10*3/uL (ref 3.4–10.8)

## 2020-10-31 LAB — CMP14+EGFR
ALT: 25 IU/L (ref 0–32)
AST: 26 IU/L (ref 0–40)
Albumin/Globulin Ratio: 2 (ref 1.2–2.2)
Albumin: 4.5 g/dL (ref 3.7–4.7)
Alkaline Phosphatase: 82 IU/L (ref 44–121)
BUN/Creatinine Ratio: 15 (ref 12–28)
BUN: 10 mg/dL (ref 8–27)
Bilirubin Total: 0.3 mg/dL (ref 0.0–1.2)
CO2: 23 mmol/L (ref 20–29)
Calcium: 9.3 mg/dL (ref 8.7–10.3)
Chloride: 99 mmol/L (ref 96–106)
Creatinine, Ser: 0.67 mg/dL (ref 0.57–1.00)
Globulin, Total: 2.2 g/dL (ref 1.5–4.5)
Glucose: 79 mg/dL (ref 70–99)
Potassium: 4.4 mmol/L (ref 3.5–5.2)
Sodium: 138 mmol/L (ref 134–144)
Total Protein: 6.7 g/dL (ref 6.0–8.5)
eGFR: 92 mL/min/{1.73_m2} (ref >59)

## 2020-10-31 LAB — TSH+FREE T4
Free T4: 1.3 ng/dL (ref 0.82–1.77)
TSH: 6.64 u[IU]/mL — ABNORMAL HIGH (ref 0.450–4.500)

## 2020-10-31 MED ORDER — LEVOTHYROXINE SODIUM 150 MCG PO TABS: 150.0000 ug | ORAL_TABLET | Freq: Every day | ORAL | 3 refills | Status: DC

## 2020-10-31 NOTE — Progress Notes (Signed)
TSH is slightly elevated, otherwise labs are great. I increased her levothyroxine to 150 mcg, and we need to set up a lab f/u to recheck it in 6 weeks.

## 2020-11-04 ENCOUNTER — Encounter: Payer: Self-pay | Admitting: Nurse Practitioner

## 2020-11-04 ENCOUNTER — Other Ambulatory Visit: Payer: Self-pay

## 2020-11-04 DIAGNOSIS — E890 Postprocedural hypothyroidism: Secondary | ICD-10-CM

## 2020-11-04 NOTE — Telephone Encounter (Signed)
Patient advised of recommendations. Will wait until next appt. With Pearline Cables to discuss

## 2020-11-05 ENCOUNTER — Other Ambulatory Visit: Payer: Self-pay

## 2020-11-05 ENCOUNTER — Encounter: Payer: Self-pay | Admitting: Urology

## 2020-11-05 ENCOUNTER — Ambulatory Visit (INDEPENDENT_AMBULATORY_CARE_PROVIDER_SITE_OTHER): Payer: PPO | Admitting: Urology

## 2020-11-05 VITALS — BP 150/80 | HR 83 | Temp 98.5°F | Wt 205.0 lb

## 2020-11-05 DIAGNOSIS — N3942 Incontinence without sensory awareness: Secondary | ICD-10-CM

## 2020-11-05 LAB — URINALYSIS, ROUTINE W REFLEX MICROSCOPIC
Bilirubin, UA: NEGATIVE
Glucose, UA: NEGATIVE
Nitrite, UA: NEGATIVE
RBC, UA: NEGATIVE
Specific Gravity, UA: 1.015 (ref 1.005–1.030)
Urobilinogen, Ur: 0.2 mg/dL (ref 0.2–1.0)
pH, UA: 6 (ref 5.0–7.5)

## 2020-11-05 LAB — MICROSCOPIC EXAMINATION
Epithelial Cells (non renal): >10 /hpf — AB (ref 0–10)
RBC, Urine: NONE SEEN /hpf (ref 0–2)
Renal Epithel, UA: NONE SEEN /hpf

## 2020-11-05 MED ORDER — MIRABEGRON ER 50 MG PO TB24: 50.0000 mg | ORAL_TABLET | Freq: Every day | ORAL | 0 refills | Status: DC

## 2020-11-05 NOTE — Progress Notes (Signed)
Assessment: 1. Urinary incontinence without sensory awareness     Plan: Diagnosis and management of overactive bladder and urinary incontinence discussed with the patient.  Options for management including avoidance of dietary irritants, behavioral modification, medical therapy, neuromodulation, and chemodenervation reviewed.  Information provided to the patient. Trial of Myrbetriq 50 mg daily.  Samples given. Return to office in 1 month.  Chief Complaint:  Chief Complaint  Patient presents with   Urinary Incontinence    History of Present Illness:  [name] is a 75 y.o. year old female who is seen in consultation from Noreene Larsson, NP  for evaluation of urinary incontinence.  She has had symptoms for approximately 1 month.  She has incontinence without sensory awareness.  She does report some urinary frequency.  No stress incontinence.  Her incontinence is more noticeable during the daytime.  She has nocturia 0-1 time.  No dysuria or gross hematuria.  No recent UTIs.  She is using 4-5 adult diapers per day.  She was given a prescription for Myrbetriq 25 mg which she has taken for 1 month.  She has not seen any improvement in her symptoms.  She does have problems with constipation and takes MiraLAX for this.  No fecal incontinence.   Past Medical History:  Past Medical History:  Diagnosis Date   Allergic rhinitis    B12 deficiency    Bipolar disorder ([place])    Essential hypertension    GERD (gastroesophageal reflux disease)    Hashimoto's thyroiditis    History of transient ischemic attack (TIA)    Or possibly migraine as well as right amaurosis fugax and ataxia - Dr. Erling Cruz   Hyperlipidemia    PSVT (paroxysmal supraventricular tachycardia) [facility])     Past Surgical History:  Past Surgical History:  Procedure Laterality Date   CATARACT EXTRACTION W/PHACO Left 05/23/2020   Procedure: CATARACT EXTRACTION PHACO AND INTRAOCULAR LENS PLACEMENT LEFT EYE;  Surgeon: Baruch Goldmann, MD;  Location: [facility];  Service: Ophthalmology;  Laterality: Left;  left CDE=15.69   CATARACT EXTRACTION W/PHACO Right 06/13/2020   Procedure: CATARACT EXTRACTION PHACO AND INTRAOCULAR LENS PLACEMENT RIGHT EYE;  Surgeon: Baruch Goldmann, MD;  Location: [facility];  Service: Ophthalmology;  Laterality: Right;  right CDE=11.33   LAPAROSCOPIC CHOLECYSTECTOMY  2010   THYROIDECTOMY  2009    Allergies:  Allergies  Allergen Reactions   Amoxicillin-Pot Clavulanate Rash   Atenolol Rash   Penicillins Rash   Sulfonamide Derivatives Rash    Family History:  Family History  Problem Relation Age of Onset   Stroke Mother    Thyroid disease Mother    Depression Father    Hypertension Sister    Aneurysm Sister    Diabetes Brother    Heart attack Brother    Hypertension Brother    Heart failure Brother    Thyroid disease Brother    Stroke Maternal Grandmother    Heart failure Maternal Grandmother    Heart failure Maternal Grandfather    Stroke Maternal Grandfather    Heart failure Paternal Grandmother     Social History:  Social History   Tobacco Use   Smoking status: Former    Packs/day: 0.50    Years: 10.00    Pack years: 5.00    Types: Cigarettes    Quit date: 01/26/1983    Years since quitting: 37.8   Smokeless tobacco: Never   Tobacco comments:    quit 30 years ago  Vaping Use   Vaping Use: Never  used  Substance Use Topics   Alcohol use: No    Alcohol/week: 0.0 standard drinks   Drug use: No    Review of symptoms:  Constitutional:  Negative for unexplained weight loss, night sweats, fever, chills ENT:  Negative for nose bleeds, sinus pain, painful swallowing CV:  Negative for chest pain, shortness of breath, exercise intolerance, palpitations, loss of consciousness Resp:  Negative for cough, wheezing, shortness of breath GI:  Negative for nausea, vomiting, diarrhea, bloody stools GU:  Positives noted in HPI; otherwise negative for gross hematuria, dysuria Neuro:   Negative for seizures, poor balance, limb weakness, slurred speech Psych:  Negative for lack of energy, depression, anxiety Endocrine:  Negative for polydipsia, polyuria, symptoms of hypoglycemia (dizziness, hunger, sweating) Hematologic:  Negative for anemia, purpura, petechia, prolonged or excessive bleeding, use of anticoagulants  Allergic:  Negative for difficulty breathing or choking as a result of exposure to anything; no shellfish allergy; no allergic response (rash/itch) to materials, foods  Physical exam: BP (!) 150/80   Pulse 83   Temp 98.5 F (36.9 C)   Wt 205 lb (93 kg)   BMI 38.73 kg/m  GENERAL APPEARANCE:  Well appearing, well developed, well nourished, NAD HEENT: Atraumatic, Normocephalic, oropharynx clear. NECK: Supple without lymphadenopathy or thyromegaly. LUNGS: Clear to auscultation bilaterally. HEART: Regular Rate and Rhythm without murmurs, gallops, or rubs. ABDOMEN: Soft, non-tender, No Masses. EXTREMITIES: Moves all extremities well.  Without clubbing, cyanosis, or edema. NEUROLOGIC:  Alert and oriented x 3, normal gait, CN II-XII grossly intact.  MENTAL STATUS:  Appropriate. BACK:  Non-tender to palpation.  No CVAT SKIN:  Warm, dry and intact.    Results: U/A:  6-10 WBCs, >10 epithelial cells, few bacteria, - nitrite  PVR: 25 ml

## 2020-11-05 NOTE — Progress Notes (Signed)
Urological Symptom Review  Patient is experiencing the following symptoms: Leakage of urine   Review of Systems  Gastrointestinal (upper)  : Indigestion/heartburn  Gastrointestinal (lower) : Constipation  Constitutional : Night Sweats Fatigue  Skin: Negative for skin symptoms  Eyes: Blurred vision  Ear/Nose/Throat : Sinus problems  Hematologic/Lymphatic: Negative for Hematologic/Lymphatic symptoms  Cardiovascular : Negative for cardiovascular symptoms  Respiratory : Shortness of breath  Endocrine: Excessive thirst  Musculoskeletal: Back pain  Neurological: Negative for neurological symptoms  Psychologic: Depression Anxiety

## 2020-11-05 NOTE — Progress Notes (Signed)
post void residual =25ml 

## 2020-11-05 NOTE — Addendum Note (Signed)
Addended by: Dorisann Frames on: 11/05/2020 04:04 PM   Modules accepted: Orders

## 2020-11-06 NOTE — Telephone Encounter (Signed)
Please advise 

## 2020-11-07 ENCOUNTER — Telehealth: Payer: Self-pay | Admitting: Cardiology

## 2020-11-07 NOTE — Telephone Encounter (Signed)
   [facility] Pre-operative Risk Assessment    Patient Name: [name]  DOB: [birth year] MRN: [id number]  HEARTCARE STAFF:  - IMPORTANT!!!!!! Under Visit Info/Reason for Call, type in Other and utilize the format Clearance MM/DD/YY or Clearance TBD. Do not use dashes or single digits. - Please review there is not already an duplicate clearance open for this procedure. - If request is for dental extraction, please clarify the # of teeth to be extracted. - If the patient is currently at the dentist's office, call Pre-Op Callback Staff (MA/nurse) to input urgent request.  - If the patient is not currently in the dentist office, please route to the Pre-Op pool.  Request for surgical clearance:  What type of surgery is being performed? Left total knee arthroplasty   When is this surgery scheduled? 02/16/2021  What type of clearance is required (medical clearance vs. Pharmacy clearance to hold med vs. Both)? both  Are there any medications that need to be held prior to surgery and how long? If patient is on any type of blood thinners.   Practice name and name of physician performing surgery? [facility]  What is the office phone number? [phone]   7.   What is the office fax number? [phone]  8.   Anesthesia type (None, local, MAC, general) ? Choice    Chanda Busing 11/07/2020, 2:52 PM  _________________________________________________________________   (provider comments below)

## 2020-11-07 NOTE — Telephone Encounter (Signed)
Primary Cardiologist:Samuel Domenic Polite, MD  Chart reviewed as part of pre-operative protocol coverage. Because of [name]'s past medical history and time since last visit, he/she will require a follow-up visit in order to better assess preoperative cardiovascular risk.  Pre-op covering staff: - Please schedule appointment and call patient to inform them. - Please contact requesting surgeon's office via preferred method (i.e, phone, fax) to inform them of need for appointment prior to surgery.  If applicable, this message will also be routed to pharmacy pool and/or primary cardiologist for input on holding anticoagulant/antiplatelet agent as requested below so that this information is available at time of patient's appointment.   Deberah Pelton, NP  11/07/2020, 3:33 PM

## 2020-11-07 NOTE — Telephone Encounter (Signed)
Pt has appt 11/17/20 with Ermalinda Barrios, PAC for pre op clearance. Will forward notes to [facility] for appt. Will send FYI to requesting office pt has appt.

## 2020-11-11 NOTE — Progress Notes (Signed)
Patient   Cardiology Office Note    Date:  11/17/2020   ID:  [name], DOB 10-Mar-1945, MRN [id number]   PCP:  Noreene Larsson, NP   [facility]  Cardiologist:  Rozann Lesches, MD   Advanced Practice Provider:  No care team member to display Electrophysiologist:  None   [phone]   Chief Complaint  Patient presents with   Pre-op Exam     History of Present Illness:  [name] is a 75 y.o. female with history of PSVT, hypertension, chest pain with coronary CTA 04/16/2020 coronary calcium score of 86 with minimal nonobstructive CAD 0 to 24% in the proximal LAD.  Patient is being seen today for preoperative clearance for total knee replacement by Dr. Wynelle Link 02/16/21. Patient complains of profuse sweating at rest and with exertion for the past couple weeks. No chest pain, dyspnea, or dizziness. Today had a quick pain from right should into her chest she thought was indigestion. Had reflux come up in her throat. No regular exercise with knee problems. TSH 104. In July and 6.64 10/30/20 on synthroid.  Past Medical History:  Diagnosis Date   Allergic rhinitis    B12 deficiency    Bipolar disorder ([place])    Essential hypertension    GERD (gastroesophageal reflux disease)    Hashimoto's thyroiditis    History of transient ischemic attack (TIA)    Or possibly migraine as well as right amaurosis fugax and ataxia - Dr. Erling Cruz   Hyperlipidemia    PSVT (paroxysmal supraventricular tachycardia) [facility])     Past Surgical History:  Procedure Laterality Date   CATARACT EXTRACTION W/PHACO Left 05/23/2020   Procedure: CATARACT EXTRACTION PHACO AND INTRAOCULAR LENS PLACEMENT LEFT EYE;  Surgeon: Baruch Goldmann, MD;  Location: [facility];  Service: Ophthalmology;  Laterality: Left;  left CDE=15.69   CATARACT EXTRACTION W/PHACO Right 06/13/2020   Procedure: CATARACT EXTRACTION PHACO AND INTRAOCULAR LENS PLACEMENT RIGHT EYE;  Surgeon: Baruch Goldmann, MD;  Location: [facility];  Service: Ophthalmology;  Laterality: Right;  right CDE=11.33   LAPAROSCOPIC CHOLECYSTECTOMY  2010   THYROIDECTOMY  2009    Current Medications: Current Meds  Medication Sig   acetaminophen (TYLENOL) 500 MG tablet Take 1,000 mg by mouth every 6 (six) hours as needed for moderate pain or headache.   albuterol (VENTOLIN HFA) 108 (90 Base) MCG/ACT inhaler Inhale 2 puffs into the lungs every 6 (six) hours as needed for wheezing or shortness of breath.   alendronate (FOSAMAX) 70 MG tablet Take 1 tablet (70 mg total) by mouth every 7 (seven) days. Take with a full glass of water on an empty stomach.   atorvastatin (LIPITOR) 80 MG tablet Take 1 tablet (80 mg total) by mouth daily.   buPROPion (WELLBUTRIN XL) 300 MG 24 hr tablet Take 300 mg by mouth daily.    calcium carbonate (TUMS - DOSED IN MG ELEMENTAL CALCIUM) 500 MG chewable tablet Chew 4 tablets by mouth daily.   Cholecalciferol (VITAMIN D3) 125 MCG (5000 UT) CAPS Take 5,000 Units by mouth daily.   clonazePAM (KLONOPIN) 0.5 MG tablet Take 0.25-0.5 mg by mouth in the morning, at noon, and at bedtime.   dexlansoprazole (DEXILANT) 60 MG capsule Take 60 mg by mouth daily.   diltiazem (CARDIZEM CD) 240 MG 24 hr capsule TAKE (1) CAPSULE BY MOUTH ONCE DAILY.   DULoxetine (CYMBALTA) 60 MG capsule Take 60 mg by mouth 2 (two) times daily.    ferrous sulfate 325 (65  FE) MG tablet Take 325 mg by mouth daily with breakfast.   levothyroxine (SYNTHROID) 150 MCG tablet Take 1 tablet (150 mcg total) by mouth daily.   losartan (COZAAR) 50 MG tablet Take 1 tablet (50 mg total) by mouth in the morning and at bedtime. (Patient taking differently: Take 50 mg by mouth daily.)   LYBALVI 5-10 MG TABS Take 1 tablet by mouth at bedtime.   mirabegron ER (MYRBETRIQ) 50 MG TB24 tablet Take 1 tablet (50 mg total) by mouth daily.   Multiple Vitamins-Minerals (ICAPS AREDS 2 PO) Take 1 capsule by mouth daily.   pantoprazole (PROTONIX) 40 MG tablet Take 40 mg by mouth  daily before breakfast.   [DISCONTINUED] atorvastatin (LIPITOR) 40 MG tablet Take 1 tablet (40 mg total) by mouth daily. Take in the evening. (Patient taking differently: Take 40 mg by mouth every evening.)     Allergies:   Amoxicillin-pot clavulanate, Atenolol, Penicillins, and Sulfonamide derivatives   Social History   Socioeconomic History   Marital status: Widowed    Spouse name: Not on file   Number of children: 2   Years of education: Not on file   Highest education level: Not on file  Occupational History   Occupation: homemaker    Employer: RETIRED  Tobacco Use   Smoking status: Former    Packs/day: 0.50    Years: 10.00    Pack years: 5.00    Types: Cigarettes    Quit date: 01/26/1983    Years since quitting: 37.8   Smokeless tobacco: Never   Tobacco comments:    quit 30 years ago  Vaping Use   Vaping Use: Never used  Substance and Sexual Activity   Alcohol use: No    Alcohol/week: 0.0 standard drinks   Drug use: No   Sexual activity: Not Currently  Other Topics Concern   Not on file  Social History Narrative   Regular exercise: walk when able   Caffeine use: occasionally   Social Determinants of Health   Financial Resource Strain: Not on file  Food Insecurity: Not on file  Transportation Needs: Not on file  Physical Activity: Not on file  Stress: Not on file  Social Connections: Not on file     Family History:  The patient's  family history includes Aneurysm in her sister; Depression in her father; Diabetes in her brother; Heart attack in her brother; Heart failure in her brother, maternal grandfather, maternal grandmother, and paternal grandmother; Hypertension in her brother and sister; Stroke in her maternal grandfather, maternal grandmother, and mother; Thyroid disease in her brother and mother.   ROS:   Please see the history of present illness.    ROS All other systems reviewed and are negative.   PHYSICAL EXAM:   VS:  BP 140/64   Pulse 80    Ht 5\' 1"  (1.549 m)   Wt 201 lb 6.4 oz (91.4 kg)   SpO2 95%   BMI 38.05 kg/m   Physical Exam  GEN: Obese, in no acute distress  Neck: no JVD, carotid bruits, or masses Cardiac:RRR; no murmurs, rubs, or gallops  Respiratory:  clear to auscultation bilaterally, normal work of breathing GI: soft, nontender, nondistended, + BS Ext: without cyanosis, clubbing, or edema, Good distal pulses bilaterally Neuro:  Alert and Oriented x 3,  Psych: euthymic mood, full affect  Wt Readings from Last 3 Encounters:  11/17/20 201 lb 6.4 oz (91.4 kg)  11/05/20 205 lb (93 kg)  10/30/20 202 lb 1.3 oz (  91.7 kg)      Studies/Labs Reviewed:   EKG:  EKG is  ordered today.  The ekg ordered today demonstrates NSR with RBBB unchanged  Recent Labs: 10/30/2020: ALT 25; BUN 10; Creatinine, Ser 0.67; Hemoglobin 12.8; Platelets 418; Potassium 4.4; Sodium 138; TSH 6.640   Lipid Panel    Component Value Date/Time   CHOL 198 08/20/2020 1010   TRIG 103 08/20/2020 1010   HDL 83 08/20/2020 1010   LDLCALC 97 08/20/2020 1010    Additional studies/ records that were reviewed today include:   Coronary CTA 04/16/2020  IMPRESSION: 1. Coronary calcium score of 86. This was 47 percentile for age and sex matched control.   2. Normal coronary origin with right dominance.   3. CAD-RADS 1. Minimal non-obstructive CAD (0-24%) in the proximal LAD. Consider non-atherosclerotic causes of chest pain. Consider preventive therapy and risk factor modification.     Electronically Signed   By: Ena Dawley   On: 04/16/2020 11:29  FINDINGS: Aortic atherosclerosis. Within the visualized portions of the thorax there are no suspicious appearing pulmonary nodules or masses, there is no acute consolidative airspace disease, no pleural effusions, no pneumothorax and no lymphadenopathy. Visualized portions of the upper abdomen are unremarkable. There are no aggressive appearing lytic or blastic lesions noted in the  visualized portions of the skeleton.   IMPRESSION: 1.  Aortic Atherosclerosis (ICD10-I70.0).   Electronically Signed: By: Vinnie Langton M.D. On: 04/16/2020 10:29    Risk Assessment/Calculations:         ASSESSMENT:    1. Preoperative clearance   2. PSVT (paroxysmal supraventricular tachycardia) (HCC)   3. Chest pain, unspecified type   4. Coronary artery calcification seen on CT scan   5. Essential hypertension   6. Mixed hyperlipidemia   7. Medication management   8. Hypothyroidism, unspecified type      PLAN:  In order of problems listed above:  Preop clearance for total knee replacement by Dr. Wynelle Link 02/16/2021.  With minimal CAD on coronary CTA 04/16/2020 calcium score 86 0 to 24% proximal LAD, normal LVEF on echo 06/2019.  No chest pain or cardiac complaints.  She is on a statin and aspirin.  METs are greater than 4.  No need for further cardiac work-up prior to knee surgery unless her symptoms change in the interim. According to the Revised Cardiac Risk Index (RCRI), her Perioperative Risk of Major Cardiac Event is (%): 0.9  Her Functional Capacity in METs is: 4.4 according to the Duke Activity Status Index (DASI).   PSVT controlled with diltiazem  History of chest pain coronary CTA 04/16/2020 calcium score 85 62nd percentile for age and sex matched control minimal nonobstructive CAD 0 to 24% in the proximal LAD  HTN controlled  Hyperlipidemia goal LDL less than 70.  LDL was 97 in 07/2020 down from 165.  Increase atorvastatin to 80 mg once daily and repeat lipids in 3 months.  Hypothyroidism TSH down to 6 earlier this month.  Has had some profuse sweating.  To follow-up with PCP next month.  No cardiac symptoms related to the profuse sweating.  Shared Decision Making/Informed Consent        Medication Adjustments/Labs and Tests Ordered: Current medicines are reviewed at length with the patient today.  Concerns regarding medicines are outlined above.   Medication changes, Labs and Tests ordered today are listed in the Patient Instructions below. There are no Patient Instructions on file for this visit.   Signed, Ermalinda Barrios, PA-C  11/17/2020 2:24 PM    [facility] [address], [place], [place]  [address] Phone: [phone]; Fax: [phone]

## 2020-11-17 ENCOUNTER — Encounter: Payer: Self-pay | Admitting: Physician Assistant

## 2020-11-17 ENCOUNTER — Ambulatory Visit: Payer: PPO | Admitting: Physician Assistant

## 2020-11-17 ENCOUNTER — Other Ambulatory Visit: Payer: Self-pay

## 2020-11-17 VITALS — BP 140/64 | HR 80 | Ht 61.0 in | Wt 201.4 lb

## 2020-11-17 DIAGNOSIS — I10 Essential (primary) hypertension: Secondary | ICD-10-CM | POA: Diagnosis not present

## 2020-11-17 DIAGNOSIS — I2510 Atherosclerotic heart disease of native coronary artery without angina pectoris: Secondary | ICD-10-CM

## 2020-11-17 DIAGNOSIS — E782 Mixed hyperlipidemia: Secondary | ICD-10-CM | POA: Diagnosis not present

## 2020-11-17 DIAGNOSIS — E039 Hypothyroidism, unspecified: Secondary | ICD-10-CM | POA: Diagnosis not present

## 2020-11-17 DIAGNOSIS — I471 Supraventricular tachycardia: Secondary | ICD-10-CM | POA: Diagnosis not present

## 2020-11-17 DIAGNOSIS — Z79899 Other long term (current) drug therapy: Secondary | ICD-10-CM | POA: Diagnosis not present

## 2020-11-17 DIAGNOSIS — R079 Chest pain, unspecified: Secondary | ICD-10-CM | POA: Diagnosis not present

## 2020-11-17 DIAGNOSIS — Z01818 Encounter for other preprocedural examination: Secondary | ICD-10-CM | POA: Diagnosis not present

## 2020-11-17 MED ORDER — ATORVASTATIN CALCIUM 80 MG PO TABS: 80.0000 mg | ORAL_TABLET | Freq: Every day | ORAL | 3 refills | Status: DC

## 2020-11-17 NOTE — Addendum Note (Signed)
Addended by: Barbarann Ehlers A on: 11/17/2020 03:58 PM   Modules accepted: Orders

## 2020-11-17 NOTE — Patient Instructions (Signed)
Medication Instructions:   INCREASE Atorvastatin to 80 mg daily at dinner or bedtime.    Labwork: Fasting lipids and Liver function tests in 3  months (mid January)   Testing/Procedures: None ordered today  Follow-Up: 6 months with Dr.McDowell     Any Other Special Instructions Will Be Listed Below (If Applicable).  If you need a refill on your cardiac medications before your next appointment, please call your pharmacy.

## 2020-11-21 ENCOUNTER — Ambulatory Visit: Payer: PPO | Admitting: Nurse Practitioner

## 2020-11-25 DIAGNOSIS — Z1211 Encounter for screening for malignant neoplasm of colon: Secondary | ICD-10-CM | POA: Diagnosis not present

## 2020-12-02 LAB — COLOGUARD: COLOGUARD: POSITIVE — AB

## 2020-12-03 ENCOUNTER — Ambulatory Visit: Payer: PPO | Admitting: Urology

## 2020-12-03 ENCOUNTER — Encounter: Payer: Self-pay | Admitting: Urology

## 2020-12-03 ENCOUNTER — Other Ambulatory Visit: Payer: Self-pay

## 2020-12-03 VITALS — BP 140/71 | HR 75 | Ht 61.5 in | Wt 205.0 lb

## 2020-12-03 DIAGNOSIS — N3942 Incontinence without sensory awareness: Secondary | ICD-10-CM

## 2020-12-03 LAB — URINALYSIS, ROUTINE W REFLEX MICROSCOPIC
Bilirubin, UA: NEGATIVE
Glucose, UA: NEGATIVE
Leukocytes,UA: NEGATIVE
Nitrite, UA: NEGATIVE
RBC, UA: NEGATIVE
Specific Gravity, UA: 1.025 (ref 1.005–1.030)
Urobilinogen, Ur: 0.2 mg/dL (ref 0.2–1.0)
pH, UA: 5.5 (ref 5.0–7.5)

## 2020-12-03 MED ORDER — GEMTESA 75 MG PO TABS: 75.0000 mg | ORAL_TABLET | Freq: Every day | ORAL | 0 refills | Status: DC

## 2020-12-03 NOTE — Progress Notes (Signed)
Assessment: 1. Urinary incontinence without sensory awareness     Plan: Discontinue Myrbetriq as this has not been effective in controlling her symptoms. Trial of Gemtesa 75 mg daily.  Samples provided. I advised patient to call the office with results of this medication in 3-4 weeks.  Chief Complaint: Chief Complaint  Patient presents with   Urinary Incontinence    HPI: [name] is a 75 y.o. female who presents for continued evaluation of urinary incontinence.   At her initial visit in 10/22, she reported incontinence symptoms for approximately 1 month.  She has incontinence without sensory awareness.  She does report some urinary frequency.  No stress incontinence.  Her incontinence is more noticeable during the daytime.  She has nocturia 0-1 time.  No dysuria or gross hematuria.  No recent UTIs.  She is using 4-5 adult diapers per day.  She was given a prescription for Myrbetriq 25 mg which she had taken for 1 month but had not seen any improvement in her symptoms.  She does have problems with constipation and takes MiraLAX for this.  No fecal incontinence. PVR = 25 ml.  She was given a trial of Myrbetriq 50 mg daily at her visit in 10/22.  She returns today for follow-up.  She continues with symptoms of frequency, nocturia, urgency, and incontinence without sensory awareness.  She has not seen any improvement in her symptoms with Myrbetriq.  No side effects noted.  Portions of the above documentation were copied from a prior visit for review purposes only.  Allergies: Allergies  Allergen Reactions   Amoxicillin-Pot Clavulanate Rash   Atenolol Rash   Penicillins Rash   Sulfonamide Derivatives Rash    PMH: Past Medical History:  Diagnosis Date   Allergic rhinitis    B12 deficiency    Bipolar disorder ([place])    Essential hypertension    GERD (gastroesophageal reflux disease)    Hashimoto's thyroiditis    History of transient ischemic attack (TIA)    Or  possibly migraine as well as right amaurosis fugax and ataxia - Dr. Erling Cruz   Hyperlipidemia    PSVT (paroxysmal supraventricular tachycardia) (HCC)     PSH: Past Surgical History:  Procedure Laterality Date   CATARACT EXTRACTION W/PHACO Left 05/23/2020   Procedure: CATARACT EXTRACTION PHACO AND INTRAOCULAR LENS PLACEMENT LEFT EYE;  Surgeon: Baruch Goldmann, MD;  Location: [facility];  Service: Ophthalmology;  Laterality: Left;  left CDE=15.69   CATARACT EXTRACTION W/PHACO Right 06/13/2020   Procedure: CATARACT EXTRACTION PHACO AND INTRAOCULAR LENS PLACEMENT RIGHT EYE;  Surgeon: Baruch Goldmann, MD;  Location: [facility];  Service: Ophthalmology;  Laterality: Right;  right CDE=11.33   LAPAROSCOPIC CHOLECYSTECTOMY  2010   THYROIDECTOMY  2009    SH: Social History   Tobacco Use   Smoking status: Former    Packs/day: 0.50    Years: 10.00    Pack years: 5.00    Types: Cigarettes    Quit date: 01/26/1983    Years since quitting: 37.8   Smokeless tobacco: Never   Tobacco comments:    quit 30 years ago  Vaping Use   Vaping Use: Never used  Substance Use Topics   Alcohol use: No    Alcohol/week: 0.0 standard drinks   Drug use: No    ROS: Constitutional:  Negative for fever, chills, weight loss CV: Negative for chest pain, previous MI, hypertension Respiratory:  Negative for shortness of breath, wheezing, sleep apnea, frequent cough GI:  Negative for nausea, vomiting, bloody  stool, GERD  PE: BP 140/71   Pulse 75   Ht 5' 1.5" (1.562 m)   Wt 205 lb (93 kg)   BMI 38.11 kg/m  GENERAL APPEARANCE:  Well appearing, well developed, well nourished, NAD HEENT:  Atraumatic, normocephalic, oropharynx clear NECK:  Supple without lymphadenopathy or thyromegaly ABDOMEN:  Soft, non-tender, no masses EXTREMITIES:  Moves all extremities well, without clubbing, cyanosis, or edema NEUROLOGIC:  Alert and oriented x 3, normal gait, CN II-XII grossly intact MENTAL STATUS:  appropriate BACK:  Non-tender to  palpation, No CVAT SKIN:  Warm, dry, and intact   Results: U/A:  dipstick negative

## 2020-12-03 NOTE — Progress Notes (Signed)
Urological Symptom Review  Patient is experiencing the following symptoms: Frequent urination Hard to postpone urination Get up at night to urinate Leakage of urine Weak stream   Review of Systems  Gastrointestinal (upper)  : Indigestion/heartburn  Gastrointestinal (lower) : Negative for lower GI symptoms  Constitutional : Night Sweats Fatigue  Skin: Negative for skin symptoms  Eyes: Blurred vision  Ear/Nose/Throat : Sinus problems  Hematologic/Lymphatic: Easy bruising  Cardiovascular : Negative for cardiovascular symptoms  Respiratory : Shortness of breath  Endocrine: Negative for endocrine symptoms  Musculoskeletal: Back pain  Neurological: Negative for neurological symptoms  Psychologic: Depression Anxiety

## 2020-12-05 ENCOUNTER — Other Ambulatory Visit: Payer: Self-pay | Admitting: *Deleted

## 2020-12-05 MED ORDER — LOSARTAN POTASSIUM 50 MG PO TABS: 50.0000 mg | ORAL_TABLET | Freq: Two times a day (BID) | ORAL | 1 refills | Status: DC

## 2020-12-11 DIAGNOSIS — M1712 Unilateral primary osteoarthritis, left knee: Secondary | ICD-10-CM | POA: Diagnosis not present

## 2020-12-23 ENCOUNTER — Ambulatory Visit: Payer: PPO | Admitting: Nurse Practitioner

## 2020-12-24 DIAGNOSIS — N951 Menopausal and female climacteric states: Secondary | ICD-10-CM | POA: Diagnosis not present

## 2020-12-24 DIAGNOSIS — E039 Hypothyroidism, unspecified: Secondary | ICD-10-CM | POA: Diagnosis not present

## 2020-12-30 DIAGNOSIS — N951 Menopausal and female climacteric states: Secondary | ICD-10-CM | POA: Diagnosis not present

## 2020-12-30 DIAGNOSIS — R5382 Chronic fatigue, unspecified: Secondary | ICD-10-CM | POA: Diagnosis not present

## 2020-12-30 DIAGNOSIS — R232 Flushing: Secondary | ICD-10-CM | POA: Diagnosis not present

## 2020-12-30 DIAGNOSIS — Z6838 Body mass index (BMI) 38.0-38.9, adult: Secondary | ICD-10-CM | POA: Diagnosis not present

## 2020-12-30 DIAGNOSIS — E039 Hypothyroidism, unspecified: Secondary | ICD-10-CM | POA: Diagnosis not present

## 2020-12-31 ENCOUNTER — Other Ambulatory Visit: Payer: Self-pay | Admitting: Nurse Practitioner

## 2020-12-31 DIAGNOSIS — Z1211 Encounter for screening for malignant neoplasm of colon: Secondary | ICD-10-CM

## 2020-12-31 DIAGNOSIS — H25043 Posterior subcapsular polar age-related cataract, bilateral: Secondary | ICD-10-CM | POA: Diagnosis not present

## 2020-12-31 NOTE — Progress Notes (Signed)
Her cologuard was positive. This doesn't mean that she has colon cancer, but that she is high risk and should have colonoscopy with the GI doctor. I sent in a referral.

## 2021-01-02 ENCOUNTER — Telehealth: Payer: Self-pay

## 2021-01-02 NOTE — Telephone Encounter (Signed)
Patient called office to let MD know that Gemtesa samples are not improving urinary symptoms- still getting up 4 times nightly and incontinent during the day.  Also reports intermittent RLQ pain.  Message sent to MD.

## 2021-01-06 ENCOUNTER — Other Ambulatory Visit: Payer: Self-pay | Admitting: Urology

## 2021-01-06 DIAGNOSIS — N3942 Incontinence without sensory awareness: Secondary | ICD-10-CM

## 2021-01-07 NOTE — Telephone Encounter (Signed)
Patient called office and made aware of referral to [facility].

## 2021-01-14 ENCOUNTER — Ambulatory Visit (INDEPENDENT_AMBULATORY_CARE_PROVIDER_SITE_OTHER): Payer: PPO | Admitting: Internal Medicine

## 2021-01-14 ENCOUNTER — Encounter: Payer: Self-pay | Admitting: Internal Medicine

## 2021-01-14 ENCOUNTER — Other Ambulatory Visit: Payer: Self-pay

## 2021-01-14 VITALS — BP 144/74 | HR 77 | Resp 17 | Ht 61.0 in | Wt 198.1 lb

## 2021-01-14 DIAGNOSIS — I10 Essential (primary) hypertension: Secondary | ICD-10-CM | POA: Diagnosis not present

## 2021-01-14 DIAGNOSIS — J0110 Acute frontal sinusitis, unspecified: Secondary | ICD-10-CM | POA: Diagnosis not present

## 2021-01-14 DIAGNOSIS — R4189 Other symptoms and signs involving cognitive functions and awareness: Secondary | ICD-10-CM | POA: Diagnosis not present

## 2021-01-14 DIAGNOSIS — E890 Postprocedural hypothyroidism: Secondary | ICD-10-CM | POA: Diagnosis not present

## 2021-01-14 MED ORDER — AZITHROMYCIN 250 MG PO TABS: ORAL_TABLET | ORAL | 0 refills | Status: AC

## 2021-01-14 MED ORDER — LOSARTAN POTASSIUM 50 MG PO TABS: 50.0000 mg | ORAL_TABLET | Freq: Two times a day (BID) | ORAL | 1 refills | Status: DC

## 2021-01-14 NOTE — Assessment & Plan Note (Signed)
Concern for dementia, but she has h/o mood disorder, which could also affect memory She is independent for ADLs and IADLs Check TSH and free T4 If persistent concern, will add Donepezil

## 2021-01-14 NOTE — Assessment & Plan Note (Signed)
Lab Results  Component Value Date   TSH 6.640 (H) 10/30/2020   Levothyroxine dose was increased to 150 mcg QD recently Has sweating recently, check TSH and free T4

## 2021-01-14 NOTE — Assessment & Plan Note (Signed)
BP Readings from Last 1 Encounters:  01/14/21 (!) 144/74   uncontrolled with Losartan 50 mg BID Could be due to anxiety vs oversupplemented thyroid hormones, check TSH and free T4 Counseled for compliance with the medications Advised DASH diet and moderate exercise/walking, at least 150 mins/week

## 2021-01-14 NOTE — Progress Notes (Signed)
Established Patient Office Visit  Subjective:  Patient ID: [name], female    DOB: [birth year]  Age: 75 y.o. MRN: [id number]  CC:  Chief Complaint  Patient presents with   Excessive Sweating    Pt states she just sweats and starts dripping water   Fatigue    Pt states she has noticed this for about a year and it is progressively getting worse   Alopecia    PT states it has been noticeable for a year   Memory Loss    Pt states she is having memory loss and her mom had Alzheimers along with her brother and sisters    HPI [name] is a 75 y.o. female with past medical history of HTN, PSVT, GERD, hypothyroidism, HLD, mood disorder, OA and obesity who presents for f/u of her chronic medical conditions.  Hypothyroidism: She has been taking levothyroxine regularly.  Her dose of levothyroxine was increased to 150 mcg daily as her TSH was elevated about 2 months ago.  She has been having excessive sweating for the last few weeks.  She reports chronic fatigue and alopecia as well, which could be related to uncontrolled hypothyroidism.  Denies any recent change in appetite or weight.  Denies any tremors or palpitations currently.  Denies any fever, chills or LAD.  She is also concerned about memory loss as her mother and sisters had Alzheimer's dementia.  She is independent with her ADLs and IADLs currently.  Of note, she has history of mood disorder and follows up with Dr. Reece Levy for it.  She is on Klonopin, Wellbutrin and Lybalvi currently.  She has been having nasal congestion and postnasal drip along with sinus pressure related headache for last 2 weeks.   Past Medical History:  Diagnosis Date   Allergic rhinitis    B12 deficiency    Bipolar disorder ([place])    Essential hypertension    GERD (gastroesophageal reflux disease)    Hashimoto's thyroiditis    History of transient ischemic attack (TIA)    Or possibly migraine as well as right amaurosis fugax and ataxia - Dr.  Erling Cruz   Hyperlipidemia    PSVT (paroxysmal supraventricular tachycardia) [facility])     Past Surgical History:  Procedure Laterality Date   CATARACT EXTRACTION W/PHACO Left 05/23/2020   Procedure: CATARACT EXTRACTION PHACO AND INTRAOCULAR LENS PLACEMENT LEFT EYE;  Surgeon: Baruch Goldmann, MD;  Location: [facility];  Service: Ophthalmology;  Laterality: Left;  left CDE=15.69   CATARACT EXTRACTION W/PHACO Right 06/13/2020   Procedure: CATARACT EXTRACTION PHACO AND INTRAOCULAR LENS PLACEMENT RIGHT EYE;  Surgeon: Baruch Goldmann, MD;  Location: [facility];  Service: Ophthalmology;  Laterality: Right;  right CDE=11.33   LAPAROSCOPIC CHOLECYSTECTOMY  2010   THYROIDECTOMY  2009    Family History  Problem Relation Age of Onset   Stroke Mother    Thyroid disease Mother    Depression Father    Hypertension Sister    Aneurysm Sister    Diabetes Brother    Heart attack Brother    Hypertension Brother    Heart failure Brother    Thyroid disease Brother    Stroke Maternal Grandmother    Heart failure Maternal Grandmother    Heart failure Maternal Grandfather    Stroke Maternal Grandfather    Heart failure Paternal Grandmother     Social History   Socioeconomic History   Marital status: Widowed    Spouse name: Not on file   Number of children: 2  Years of education: Not on file   Highest education level: Not on file  Occupational History   Occupation: homemaker    Employer: RETIRED  Tobacco Use   Smoking status: Former    Packs/day: 0.50    Years: 10.00    Pack years: 5.00    Types: Cigarettes    Quit date: 01/26/1983    Years since quitting: 37.9   Smokeless tobacco: Never   Tobacco comments:    quit 30 years ago  Vaping Use   Vaping Use: Never used  Substance and Sexual Activity   Alcohol use: No    Alcohol/week: 0.0 standard drinks   Drug use: No   Sexual activity: Not Currently  Other Topics Concern   Not on file  Social History Narrative   Regular exercise: walk when able    Caffeine use: occasionally   Social Determinants of Health   Financial Resource Strain: Not on file  Food Insecurity: Not on file  Transportation Needs: Not on file  Physical Activity: Not on file  Stress: Not on file  Social Connections: Not on file  Intimate Partner Violence: Not on file    Outpatient Medications Prior to Visit  Medication Sig Dispense Refill   acetaminophen (TYLENOL) 500 MG tablet Take 1,000 mg by mouth every 6 (six) hours as needed for moderate pain or headache.     albuterol (VENTOLIN HFA) 108 (90 Base) MCG/ACT inhaler Inhale 2 puffs into the lungs every 6 (six) hours as needed for wheezing or shortness of breath. 18 g 0   alendronate (FOSAMAX) 70 MG tablet Take 1 tablet (70 mg total) by mouth every 7 (seven) days. Take with a full glass of water on an empty stomach. 4 tablet 11   atorvastatin (LIPITOR) 80 MG tablet Take 1 tablet (80 mg total) by mouth daily. 90 tablet 3   buPROPion (WELLBUTRIN XL) 300 MG 24 hr tablet Take 300 mg by mouth daily.      calcium carbonate (TUMS - DOSED IN MG ELEMENTAL CALCIUM) 500 MG chewable tablet Chew 4 tablets by mouth daily.     Cholecalciferol (VITAMIN D3) 125 MCG (5000 UT) CAPS Take 5,000 Units by mouth daily.     clonazePAM (KLONOPIN) 0.5 MG tablet Take 0.25-0.5 mg by mouth in the morning, at noon, and at bedtime.     dexlansoprazole (DEXILANT) 60 MG capsule Take 60 mg by mouth daily.     diltiazem (CARDIZEM CD) 240 MG 24 hr capsule TAKE (1) CAPSULE BY MOUTH ONCE DAILY. 90 capsule 3   DULoxetine (CYMBALTA) 60 MG capsule Take 60 mg by mouth 2 (two) times daily.      ferrous sulfate 325 (65 FE) MG tablet Take 325 mg by mouth daily with breakfast.     levothyroxine (SYNTHROID) 150 MCG tablet Take 1 tablet (150 mcg total) by mouth daily. 90 tablet 3   LYBALVI 5-10 MG TABS Take 1 tablet by mouth at bedtime.     Multiple Vitamins-Minerals (ICAPS AREDS 2 PO) Take 1 capsule by mouth daily.     pantoprazole (PROTONIX) 40 MG tablet Take  40 mg by mouth daily before breakfast.     Vibegron (GEMTESA) 75 MG TABS Take 75 mg by mouth daily. 28 tablet 0   Vitamin D-Vitamin K (VITAMIN K2-VITAMIN D3 PO) Take 1 tablet by mouth daily.     losartan (COZAAR) 50 MG tablet Take 1 tablet (50 mg total) by mouth in the morning and at bedtime. 60 tablet 1  Digestive Enzymes (SUPER ENZYMES PO) Take 1 tablet by mouth daily. (Patient not taking: Reported on 01/14/2021)     No facility-administered medications prior to visit.    Allergies  Allergen Reactions   Amoxicillin-Pot Clavulanate Rash   Atenolol Rash   Penicillins Rash   Sulfonamide Derivatives Rash    ROS Review of Systems  Constitutional:  Positive for fatigue. Negative for chills and fever.  HENT:  Positive for congestion and sinus pain.   Eyes:  Negative for pain and discharge.  Respiratory:  Negative for cough and shortness of breath.   Cardiovascular:  Negative for chest pain and palpitations.  Gastrointestinal:  Negative for diarrhea, nausea and vomiting.  Genitourinary:  Negative for dysuria and hematuria.  Musculoskeletal:  Negative for neck pain and neck stiffness.  Skin:  Negative for rash.  Neurological:  Negative for dizziness and syncope.  Psychiatric/Behavioral:  Negative for agitation and behavioral problems.      Objective:    Physical Exam Vitals reviewed.  Constitutional:      General: She is not in acute distress.    Appearance: She is obese. She is not diaphoretic.  HENT:     Head: Normocephalic and atraumatic.     Nose: Congestion present.     Mouth/Throat:     Mouth: Mucous membranes are moist.     Pharynx: No posterior oropharyngeal erythema.  Eyes:     General: No scleral icterus.    Extraocular Movements: Extraocular movements intact.  Cardiovascular:     Rate and Rhythm: Normal rate and regular rhythm.     Pulses: Normal pulses.     Heart sounds: Normal heart sounds. No murmur heard. Pulmonary:     Breath sounds: Normal breath  sounds. No wheezing or rales.  Musculoskeletal:     Cervical back: Neck supple. No tenderness.     Right lower leg: No edema.     Left lower leg: No edema.  Skin:    General: Skin is warm.     Findings: No rash.  Neurological:     General: No focal deficit present.     Mental Status: She is alert and oriented to person, place, and time.  Psychiatric:        Mood and Affect: Mood normal.        Behavior: Behavior normal.    BP (!) 144/74    Pulse 77    Resp 17    Ht '5\' 1"'  (1.549 m)    Wt 198 lb 1.3 oz (89.8 kg)    SpO2 97%    BMI 37.43 kg/m  Wt Readings from Last 3 Encounters:  01/14/21 198 lb 1.3 oz (89.8 kg)  12/03/20 205 lb (93 kg)  11/17/20 201 lb 6.4 oz (91.4 kg)    Lab Results  Component Value Date   TSH 6.640 (H) 10/30/2020   Lab Results  Component Value Date   WBC 10.2 10/30/2020   HGB 12.8 10/30/2020   HCT 38.5 10/30/2020   MCV 96 10/30/2020   PLT 418 10/30/2020   Lab Results  Component Value Date   NA 138 10/30/2020   K 4.4 10/30/2020   CO2 23 10/30/2020   GLUCOSE 79 10/30/2020   BUN 10 10/30/2020   CREATININE 0.67 10/30/2020   BILITOT 0.3 10/30/2020   ALKPHOS 82 10/30/2020   AST 26 10/30/2020   ALT 25 10/30/2020   PROT 6.7 10/30/2020   ALBUMIN 4.5 10/30/2020   CALCIUM 9.3 10/30/2020   ANIONGAP 9 04/10/2020  EGFR 92 10/30/2020   Lab Results  Component Value Date   CHOL 198 08/20/2020   Lab Results  Component Value Date   HDL 83 08/20/2020   Lab Results  Component Value Date   LDLCALC 97 08/20/2020   Lab Results  Component Value Date   TRIG 103 08/20/2020   No results found for: CHOLHDL Lab Results  Component Value Date   HGBA1C 6.2 (H) 08/20/2020      Assessment & Plan:   Problem List Items Addressed This Visit       Cardiovascular and Mediastinum   Essential hypertension    BP Readings from Last 1 Encounters:  01/14/21 (!) 144/74  uncontrolled with Losartan 50 mg BID Could be due to anxiety vs oversupplemented thyroid  hormones, check TSH and free T4 Counseled for compliance with the medications Advised DASH diet and moderate exercise/walking, at least 150 mins/week       Relevant Medications   losartan (COZAAR) 50 MG tablet     Endocrine   Postoperative hypothyroidism - Primary    Lab Results  Component Value Date   TSH 6.640 (H) 10/30/2020  Levothyroxine dose was increased to 150 mcg QD recently Has sweating recently, check TSH and free T4      Relevant Orders   TSH + free T4   Basic Metabolic Panel (BMET)     Other   Cognitive decline    Concern for dementia, but she has h/o mood disorder, which could also affect memory She is independent for ADLs and IADLs Check TSH and free T4 If persistent concern, will add Donepezil      Other Visit Diagnoses     Acute non-recurrent frontal sinusitis    Started Azithromycin as she has persistent symptoms despite symptomatic treatment Uses Nasacort for allergies/nasal congestion   Relevant Medications   azithromycin (ZITHROMAX) 250 MG tablet       Meds ordered this encounter  Medications   azithromycin (ZITHROMAX) 250 MG tablet    Sig: Take 2 tablets on day 1, then 1 tablet daily on days 2 through 5    Dispense:  6 tablet    Refill:  0   losartan (COZAAR) 50 MG tablet    Sig: Take 1 tablet (50 mg total) by mouth in the morning and at bedtime.    Dispense:  180 tablet    Refill:  1    Follow-up: Return in about 4 months (around 05/15/2021) for Hypothyroidism and HTN.    Lindell Spar, MD

## 2021-01-14 NOTE — Patient Instructions (Addendum)
Please start taking Azithromycin for sinusitis.  Please continue to take medications as prescribed.  Please continue to follow low salt diet and ambulate as tolerated.

## 2021-01-15 ENCOUNTER — Encounter: Payer: Self-pay | Admitting: Internal Medicine

## 2021-01-15 LAB — TSH+FREE T4
Free T4: 1.63 ng/dL (ref 0.82–1.77)
TSH: 1.1 u[IU]/mL (ref 0.450–4.500)

## 2021-01-15 LAB — BASIC METABOLIC PANEL
BUN/Creatinine Ratio: 17 (ref 12–28)
BUN: 11 mg/dL (ref 8–27)
CO2: 26 mmol/L (ref 20–29)
Calcium: 9.5 mg/dL (ref 8.7–10.3)
Chloride: 97 mmol/L (ref 96–106)
Creatinine, Ser: 0.64 mg/dL (ref 0.57–1.00)
Glucose: 59 mg/dL — ABNORMAL LOW (ref 70–99)
Potassium: 4.4 mmol/L (ref 3.5–5.2)
Sodium: 139 mmol/L (ref 134–144)
eGFR: 93 mL/min/{1.73_m2} (ref >59)

## 2021-01-15 NOTE — Telephone Encounter (Signed)
Pt advised per patel that blood sugars may be dropping to get orange juice and eat at regular intervals. To call back if no better and if we are closed go to ER for further evaluation with verbal understanding

## 2021-01-20 ENCOUNTER — Telehealth: Payer: Self-pay | Admitting: Cardiology

## 2021-01-20 ENCOUNTER — Encounter: Payer: Self-pay | Admitting: Orthopedic Surgery

## 2021-01-20 ENCOUNTER — Encounter: Payer: Self-pay | Admitting: Cardiology

## 2021-01-20 NOTE — Telephone Encounter (Signed)
New Message:     Patient needs to have  knee surgery on 02-16-21. Her surgeon wants to know if Dr Domenic Polite needs to see her before her surgery doctor. She needs to know today if possible. please.

## 2021-01-20 NOTE — Telephone Encounter (Signed)
Please start formal clearance request and get info from surgeon and send back to Preop. Richardson Dopp, PA-C    01/20/2021 3:35 PM

## 2021-01-20 NOTE — H&P (Addendum)
TOTAL KNEE ADMISSION H&P  Patient is being admitted for left total knee arthroplasty.  Subjective:  Chief Complaint: Left knee pain.  HPI: [name], 75 y.o. female has a history of pain and functional disability in the left knee due to arthritis and has failed non-surgical conservative treatments for greater than 12 weeks to include corticosteriod injections and activity modification. Onset of symptoms was gradual, starting >10 years ago with  gradually increasing  course since that time. The patient noted no past surgery on the left knee.  Patient currently rates pain in the left knee at 7 out of 10 with activity. Patient has night pain, worsening of pain with activity and weight bearing, pain with passive range of motion, and crepitus. Patient has evidence of  bone-on-bone arthritis in the medial and patellofemoral compartments with varus deformity  by imaging studies. There is no active infection.  Patient Active Problem List   Diagnosis Date Noted   Cognitive decline 01/14/2021   Breast pain, left 09/30/2020   Weight gain 08/19/2020   Urinary urgency 08/19/2020   Ataxia 06/09/2020   Constipation 06/09/2020   Hearing loss 06/09/2020   Irritable bowel syndrome 06/09/2020   Vitamin D deficiency 06/09/2020   B12 deficiency anemia 04/07/2020   Mood disorder ([place]) 04/07/2020   Anxiety 04/07/2020   Gastroesophageal reflux disease 04/07/2020   Iron deficiency anemia 04/07/2020   Fatigue 04/07/2020   Osteoarthritis of knee 09/12/2017   Postoperative hypothyroidism 12/08/2012   PSVT (paroxysmal supraventricular tachycardia) ([place]) 03/12/2011   Exertional dyspnea 02/05/2011   Hyperlipidemia 11/06/2009   Essential hypertension 11/06/2009    Past Medical History:  Diagnosis Date   Allergic rhinitis    B12 deficiency    Bipolar disorder ([place])    Essential hypertension    GERD (gastroesophageal reflux disease)    Hashimoto's thyroiditis    History of transient ischemic attack  (TIA)    Or possibly migraine as well as right amaurosis fugax and ataxia - Dr. Erling Cruz   Hyperlipidemia    PSVT (paroxysmal supraventricular tachycardia) [facility])     Past Surgical History:  Procedure Laterality Date   CATARACT EXTRACTION W/PHACO Left 05/23/2020   Procedure: CATARACT EXTRACTION PHACO AND INTRAOCULAR LENS PLACEMENT LEFT EYE;  Surgeon: Baruch Goldmann, MD;  Location: [facility];  Service: Ophthalmology;  Laterality: Left;  left CDE=15.69   CATARACT EXTRACTION W/PHACO Right 06/13/2020   Procedure: CATARACT EXTRACTION PHACO AND INTRAOCULAR LENS PLACEMENT RIGHT EYE;  Surgeon: Baruch Goldmann, MD;  Location: [facility];  Service: Ophthalmology;  Laterality: Right;  right CDE=11.33   LAPAROSCOPIC CHOLECYSTECTOMY  2010   THYROIDECTOMY  2009    Prior to Admission medications   Medication Sig Start Date End Date Taking? Authorizing Provider  acetaminophen (TYLENOL) 500 MG tablet Take 1,000 mg by mouth every 6 (six) hours as needed for moderate pain or headache.    [provider]  albuterol (VENTOLIN HFA) 108 (90 Base) MCG/ACT inhaler Inhale 2 puffs into the lungs every 6 (six) hours as needed for wheezing or shortness of breath. 09/02/20   Noreene Larsson, NP  alendronate (FOSAMAX) 70 MG tablet Take 1 tablet (70 mg total) by mouth every 7 (seven) days. Take with a full glass of water on an empty stomach. 06/05/20   Noreene Larsson, NP  atorvastatin (LIPITOR) 80 MG tablet Take 1 tablet (80 mg total) by mouth daily. 11/17/20 02/15/21  Imogene Burn, PA-C  buPROPion (WELLBUTRIN XL) 300 MG 24 hr tablet Take 300 mg by mouth  daily.  02/08/14   [provider]  calcium carbonate (TUMS - DOSED IN MG ELEMENTAL CALCIUM) 500 MG chewable tablet Chew 4 tablets by mouth daily.    [provider]  Cholecalciferol (VITAMIN D3) 125 MCG (5000 UT) CAPS Take 5,000 Units by mouth daily.    [provider]  clonazePAM (KLONOPIN) 0.5 MG tablet Take 0.25-0.5 mg by mouth in the morning, at  noon, and at bedtime.      dexlansoprazole (DEXILANT) 60 MG capsule Take 60 mg by mouth daily.    [provider]  Digestive Enzymes (SUPER ENZYMES PO) Take 1 tablet by mouth daily. Patient not taking: Reported on 01/14/2021    [provider]  diltiazem (CARDIZEM CD) 240 MG 24 hr capsule TAKE (1) CAPSULE BY MOUTH ONCE DAILY. 07/30/20   Satira Sark, MD  DULoxetine (CYMBALTA) 60 MG capsule Take 60 mg by mouth 2 (two) times daily.  01/29/14   [provider]  ferrous sulfate 325 (65 FE) MG tablet Take 325 mg by mouth daily with breakfast. 07/20/19   [provider]  levothyroxine (SYNTHROID) 150 MCG tablet Take 1 tablet (150 mcg total) by mouth daily. 10/31/20   Noreene Larsson, NP  losartan (COZAAR) 50 MG tablet Take 1 tablet (50 mg total) by mouth in the morning and at bedtime. 01/14/21   Lindell Spar, MD  LYBALVI 5-10 MG TABS Take 1 tablet by mouth at bedtime. 03/10/20   [provider]  Multiple Vitamins-Minerals (ICAPS AREDS 2 PO) Take 1 capsule by mouth daily.    [provider]  pantoprazole (PROTONIX) 40 MG tablet Take 40 mg by mouth daily before breakfast. 09/11/19   [provider]  Vibegron (GEMTESA) 75 MG TABS Take 75 mg by mouth daily. 12/03/20   Stoneking, Reece Leader., MD  Vitamin D-Vitamin K (VITAMIN K2-VITAMIN D3 PO) Take 1 tablet by mouth daily.    [provider]    Allergies  Allergen Reactions   Amoxicillin-Pot Clavulanate Rash   Atenolol Rash   Penicillins Rash   Sulfonamide Derivatives Rash    Social History   Socioeconomic History   Marital status: Widowed    Spouse name: Not on file   Number of children: 2   Years of education: Not on file   Highest education level: Not on file  Occupational History   Occupation: homemaker    Employer: RETIRED  Tobacco Use   Smoking status: Former    Packs/day: 0.50    Years: 10.00    Pack years: 5.00    Types: Cigarettes    Quit date: 01/26/1983     Years since quitting: 38.0   Smokeless tobacco: Never   Tobacco comments:    quit 30 years ago  Vaping Use   Vaping Use: Never used  Substance and Sexual Activity   Alcohol use: No    Alcohol/week: 0.0 standard drinks   Drug use: No   Sexual activity: Not Currently  Other Topics Concern   Not on file  Social History Narrative   Regular exercise: walk when able   Caffeine use: occasionally   Social Determinants of Health   Financial Resource Strain: Not on file  Food Insecurity: Not on file  Transportation Needs: Not on file  Physical Activity: Not on file  Stress: Not on file  Social Connections: Not on file  Intimate Partner Violence: Not on file    Tobacco Use: Medium Risk   Smoking Tobacco Use: Former  Smokeless Tobacco Use: Never   Passive Exposure: Not on file   Social History   Substance and Sexual Activity  Alcohol Use No   Alcohol/week: 0.0 standard drinks    Family History  Problem Relation Age of Onset   Stroke Mother    Thyroid disease Mother    Depression Father    Hypertension Sister    Aneurysm Sister    Diabetes Brother    Heart attack Brother    Hypertension Brother    Heart failure Brother    Thyroid disease Brother    Stroke Maternal Grandmother    Heart failure Maternal Grandmother    Heart failure Maternal Grandfather    Stroke Maternal Grandfather    Heart failure Paternal Grandmother     Review of Systems  Constitutional:  Negative for chills and fever.  HENT:  Negative for congestion, sore throat and tinnitus.   Eyes:  Negative for double vision, photophobia and pain.  Respiratory:  Negative for cough, shortness of breath and wheezing.   Cardiovascular:  Negative for chest pain, palpitations and orthopnea.  Gastrointestinal:  Negative for heartburn, nausea and vomiting.  Genitourinary:  Negative for dysuria, frequency and urgency.  Musculoskeletal:  Positive for joint pain.  Neurological:  Negative for dizziness, weakness and  headaches.   Objective:  Physical Exam: Well nourished and well developed.  General: Alert and oriented x3, cooperative and pleasant, no acute distress.  Head: normocephalic, atraumatic, neck supple.  Eyes: EOMI.  Musculoskeletal:  Left Knee Exam:   No effusion present. No swelling present. Varus deformity.   The range of motion is: 10 to 115 degrees.   Moderate crepitus on range of motion of the knee.   Positive medial joint line tenderness.   No lateral joint line tenderness.   The knee is stable.  Calves soft and nontender. Motor function intact in LE. Strength 5/5 LE bilaterally. Neuro: Distal pulses 2+. Sensation to light touch intact in LE.  Imaging Review Plain radiographs demonstrate severe degenerative joint disease of the left knee. The overall alignment is significant varus. The bone quality appears to be adequate for age and reported activity level.  Assessment/Plan:  End stage arthritis, left knee   The patient history, physical examination, clinical judgment of the provider and imaging studies are consistent with end stage degenerative joint disease of the left knee and total knee arthroplasty is deemed medically necessary. The treatment options including medical management, injection therapy arthroscopy and arthroplasty were discussed at length. The risks and benefits of total knee arthroplasty were presented and reviewed. The risks due to aseptic loosening, infection, stiffness, patella tracking problems, thromboembolic complications and other imponderables were discussed. The patient acknowledged the explanation, agreed to proceed with the plan and consent was signed. Patient is being admitted for inpatient treatment for surgery, pain control, PT, OT, prophylactic antibiotics, VTE prophylaxis, progressive ambulation and ADLs and discharge planning. The patient is planning to be discharged  home .   Patient's anticipated LOS is less than 2 midnights, meeting these  requirements: - Lives within 1 hour of care - Has a competent adult at home to recover with post-op recover - NO history of  - Chronic pain requiring opioids  - Diabetes  - Coronary Artery Disease  - Heart failure  - Heart attack  - Stroke  - DVT/VTE  - Cardiac arrhythmia  - Respiratory Failure/COPD  - Renal failure  - Anemia  - Advanced Liver disease  Therapy Plans: Outpatient therapy at [facility]) Disposition:  Home with daughter Planned DVT Prophylaxis: Aspirin 325 mg BID DME Needed: Gilford Rile PCP: Demetrius Revel, NP (clearance received) Cardiologist: Rozann Lesches, MD (clearance from 10/22) TXA: IV Allergies: NSAIDs (rash), flagyl, augmentin, sulfa Anesthesia Concerns: None BMI: 33.6 Last HgbA1c: Not diabetic Pharmacy: [facility]  - Patient was instructed on what medications to stop prior to surgery. - Follow-up visit in 2 weeks with Dr. Wynelle Link - Begin physical therapy following surgery - Pre-operative lab work as pre-surgical testing - Prescriptions will be provided in hospital at time of discharge  Theresa Duty, PA-C Orthopedic Surgery [facility] [place]

## 2021-01-22 DIAGNOSIS — F3181 Bipolar II disorder: Secondary | ICD-10-CM | POA: Diagnosis not present

## 2021-01-23 DIAGNOSIS — M25561 Pain in right knee: Secondary | ICD-10-CM | POA: Diagnosis not present

## 2021-01-23 DIAGNOSIS — M1712 Unilateral primary osteoarthritis, left knee: Secondary | ICD-10-CM | POA: Diagnosis not present

## 2021-01-30 ENCOUNTER — Encounter (HOSPITAL_COMMUNITY): Payer: Self-pay

## 2021-01-30 NOTE — Progress Notes (Addendum)
COVID swab appointment: 02-12-21 @ 1200  COVID Vaccine Completed: Yes x2 Date COVID Vaccine completed: Has received booster: Yes x1 COVID vaccine manufacturer: Pfizer     Date of COVID positive in last 90 days:  No  PCP - Ihor Dow, MD Cardiologist - Rozann Lesches, MD  Cardiac clearance in Epic dated 11-17-20 by Ermalinda Barrios, PA-C  Chest x-ray - N/A EKG - 11-17-20 Epic Stress Test - 11-15-19 Epic ECHO - 07-02-19 Epic Cardiac Cath - N/A Pacemaker/ICD device last checked: Spinal Cord Stimulator: Long Term Monitor - 2021 Epic Coronary CT - 04-16-20 Epic  Sleep Study -  Yes, neg sleep apnea CPAP - No  Fasting Blood Sugar - N/A Checks Blood Sugar _____ times a day  Blood Thinner Instructions:  N/A Aspirin Instructions: Last Dose:  Activity level:   Unable to climb stairs due to knee pain.  Able to perform activities of daily living without stopping and without symptoms of chest pain or shortness of breath. Patient lives alone    Anesthesia review:   CAD, PSVT, HTN, chest pain, RBBB  Patient denies shortness of breath, fever, cough and chest pain at PAT appointment  Patient verbalized understanding of instructions that were given to them at the PAT appointment. Patient was also instructed that they will need to review over the PAT instructions again at home before surgery.

## 2021-01-30 NOTE — Patient Instructions (Addendum)
DUE TO COVID-19 ONLY ONE VISITOR IS ALLOWED TO COME WITH YOU AND STAY IN THE WAITING ROOM ONLY DURING PRE OP AND PROCEDURE.   **NO VISITORS ARE ALLOWED IN THE SHORT STAY AREA OR RECOVERY ROOM!!**  IF YOU WILL BE ADMITTED INTO THE HOSPITAL YOU ARE ALLOWED ONLY TWO SUPPORT PEOPLE DURING VISITATION HOURS ONLY (7 AM -8PM)    Up to two visitors ages 25+ are allowed at one time in a patient's room.  The visitors may rotate out with other people throughout the day.  Additionally, up to two children between the ages of 19 and 66 are allowed and do not count toward the number of allowed visitors.  Children within this age range must be accompanied by an adult visitor.  One adult visitor may remain with the patient overnight and must be in the room by 8 PM.  COVID SWAB TESTING MUST BE COMPLETED ON:  02-12-21 @ 12:00 PM   COME IN THROUGH MAIN ENTRANCE of [facility].  Take a seat in the lobby area to the right as you come in the main entrance.  Call [phone]  and give your name and let them know you are here for COVID testing  You are not required to quarantine, however you are required to wear a well-fitted mask when you are out and around people not in your household.  Hand Hygiene often Do NOT share personal items Notify your provider if you are in close contact with someone who has COVID or you develop fever 100.4 or greater, new onset of sneezing, cough, sore throat, shortness of breath or body aches.        Your procedure is scheduled on: Monday, 02-16-21   Report to [facility] Main  Entrance     Report to admitting at 8:45 AM   Call this number if you have problems the morning of surgery [phone]   Do not eat food :After Midnight.   May have liquids until 8:30 AM day of surgery  CLEAR LIQUID DIET  Foods Allowed                                                                     Foods Excluded  Water, Black Coffee (no milk/no creamer) and tea, regular and decaf                               liquids that you cannot  Plain Jell-O in any flavor  (No red)                         see through such as: Fruit ices (not with fruit pulp)                                 milk, soups, orange juice  Iced Popsicles (No red)                                    All solid food  Apple juices Sports drinks like Gatorade (No red) Lightly seasoned clear broth or consume(fat free) Sugar  Complete one Ensure drink the morning of surgery at 8:30 AM  the day of surgery.      The day of surgery:  Drink ONE (1) Pre-Surgery Clear Ensure the morning of surgery. Drink in one sitting. Do not sip.  This drink was given to you during your hospital  pre-op appointment visit. Nothing else to drink after completing the Pre-Surgery Clear Ensure           If you have questions, please contact your surgeons office.     Oral Hygiene is also important to reduce your risk of infection.                                    Remember - BRUSH YOUR TEETH THE MORNING OF SURGERY WITH YOUR REGULAR TOOTHPASTE   Do NOT smoke after Midnight  Take these medicines the morning of surgery with A SIP OF WATER:  Tylenol, Atorvastatin, Bupropion, Clonazepam, Dexilant, Duloxetine, Diltiazem, Levothyroxine, Pantoprazole, Myrbetriq   Stop all vitamins and herbal supplements a week before surgery             You may not have any metal on your body including hair pins, jewelry, and body piercing             Do not wear make-up, lotions, powders, perfumes, or deodorant  Do not wear nail polish including gel and S&S, artificial/acrylic nails, or any other type of covering on natural nails including finger and toenails. If you have artificial nails, gel coating, etc. that needs to be removed by a nail salon please have this removed prior to surgery or surgery may need to be canceled/ delayed if the surgeon/ anesthesia feels like they are unable to be safely monitored.   Do not shave  48  hours prior to surgery.   Do not bring valuables to the hospital. [place].   Contacts, dentures or bridgework may not be worn into surgery.   Bring small overnight bag day of surgery.  Special Instructions: Bring a copy of your healthcare power of attorney and living will documents the day of surgery if you haven't scanned them in before.  Please read over the following fact sheets you were given: IF YOU HAVE QUESTIONS ABOUT YOUR PRE OP INSTRUCTIONS PLEASE CALL [facility] - Preparing for Surgery Before surgery, you can play an important role.  Because skin is not sterile, your skin needs to be as free of germs as possible.  You can reduce the number of germs on your skin by washing with CHG (chlorahexidine gluconate) soap before surgery.  CHG is an antiseptic cleaner which kills germs and bonds with the skin to continue killing germs even after washing. Please DO NOT use if you have an allergy to CHG or antibacterial soaps.  If your skin becomes reddened/irritated stop using the CHG and inform your nurse when you arrive at Short Stay. Do not shave (including legs and underarms) for at least 48 hours prior to the first CHG shower.  You may shave your face/neck.  Please follow these instructions carefully:  1.  Shower with CHG Soap the night before surgery and the  morning of surgery.  2.  If you choose to wash your hair, wash your hair first as usual with  your normal  shampoo.  3.  After you shampoo, rinse your hair and body thoroughly to remove the shampoo.                             4.  Use CHG as you would any other liquid soap.  You can apply chg directly to the skin and wash.  Gently with a scrungie or clean washcloth.  5.  Apply the CHG Soap to your body ONLY FROM THE NECK DOWN.   Do   not use on face/ open                           Wound or open sores. Avoid contact with eyes, ears mouth and   genitals (private parts).                        Wash face,  Genitals (private parts) with your normal soap.             6.  Wash thoroughly, paying special attention to the area where your    surgery  will be performed.  7.  Thoroughly rinse your body with warm water from the neck down.  8.  DO NOT shower/wash with your normal soap after using and rinsing off the CHG Soap.                9.  Pat yourself dry with a clean towel.            10.  Wear clean pajamas.            11.  Place clean sheets on your bed the night of your first shower and do not  sleep with pets. Day of Surgery : Do not apply any lotions/deodorants the morning of surgery.  Please wear clean clothes to the hospital/surgery center.  FAILURE TO FOLLOW THESE INSTRUCTIONS MAY RESULT IN THE CANCELLATION OF YOUR SURGERY  PATIENT SIGNATURE_________________________________  NURSE SIGNATURE__________________________________  ________________________________________________________________________   Adam Phenix  An incentive spirometer is a tool that can help keep your lungs clear and active. This tool measures how well you are filling your lungs with each breath. Taking long deep breaths may help reverse or decrease the chance of developing breathing (pulmonary) problems (especially infection) following: A long period of time when you are unable to move or be active. BEFORE THE PROCEDURE  If the spirometer includes an indicator to show your best effort, your nurse or respiratory therapist will set it to a desired goal. If possible, sit up straight or lean slightly forward. Try not to slouch. Hold the incentive spirometer in an upright position. INSTRUCTIONS FOR USE  Sit on the edge of your bed if possible, or sit up as far as you can in bed or on a chair. Hold the incentive spirometer in an upright position. Breathe out normally. Place the mouthpiece in your mouth and seal your lips tightly around it. Breathe in slowly and as deeply as possible,  raising the piston or the ball toward the top of the column. Hold your breath for 3-5 seconds or for as long as possible. Allow the piston or ball to fall to the bottom of the column. Remove the mouthpiece from your mouth and breathe out normally. Rest for a few seconds and repeat Steps 1 through 7 at least 10 times every 1-2 hours when you are  awake. Take your time and take a few normal breaths between deep breaths. The spirometer may include an indicator to show your best effort. Use the indicator as a goal to work toward during each repetition. After each set of 10 deep breaths, practice coughing to be sure your lungs are clear. If you have an incision (the cut made at the time of surgery), support your incision when coughing by placing a pillow or rolled up towels firmly against it. Once you are able to get out of bed, walk around indoors and cough well. You may stop using the incentive spirometer when instructed by your caregiver.  RISKS AND COMPLICATIONS Take your time so you do not get dizzy or light-headed. If you are in pain, you may need to take or ask for pain medication before doing incentive spirometry. It is harder to take a deep breath if you are having pain. AFTER USE Rest and breathe slowly and easily. It can be helpful to keep track of a log of your progress. Your caregiver can provide you with a simple table to help with this. If you are using the spirometer at home, follow these instructions: [place] IF:  You are having difficultly using the spirometer. You have trouble using the spirometer as often as instructed. Your pain medication is not giving enough relief while using the spirometer. You develop fever of 100.5 F (38.1 C) or higher. SEEK IMMEDIATE MEDICAL CARE IF:  You cough up bloody sputum that had not been present before. You develop fever of 102 F (38.9 C) or greater. You develop worsening pain at or near the incision site. MAKE SURE YOU:  Understand  these instructions. Will watch your condition. Will get help right away if you are not doing well or get worse. Document Released: 05/24/2006 Document Revised: 04/05/2011 Document Reviewed: 07/25/2006 ExitCare Patient Information 2014 ExitCare, [place].   ________________________________________________________________________  WHAT IS A BLOOD TRANSFUSION? Blood Transfusion Information  A transfusion is the replacement of blood or some of its parts. Blood is made up of multiple cells which provide different functions. Red blood cells carry oxygen and are used for blood loss replacement. White blood cells fight against infection. Platelets control bleeding. Plasma helps clot blood. Other blood products are available for specialized needs, such as hemophilia or other clotting disorders. BEFORE THE TRANSFUSION  Who gives blood for transfusions?  Healthy volunteers who are fully evaluated to make sure their blood is safe. This is blood bank blood. Transfusion therapy is the safest it has ever been in the practice of medicine. Before blood is taken from a donor, a complete history is taken to make sure that person has no history of diseases nor engages in risky social behavior (examples are intravenous drug use or sexual activity with multiple partners). The donor's travel history is screened to minimize risk of transmitting infections, such as malaria. The donated blood is tested for signs of infectious diseases, such as HIV and hepatitis. The blood is then tested to be sure it is compatible with you in order to minimize the chance of a transfusion reaction. If you or a relative donates blood, this is often done in anticipation of surgery and is not appropriate for emergency situations. It takes many days to process the donated blood. RISKS AND COMPLICATIONS Although transfusion therapy is very safe and saves many lives, the main dangers of transfusion include:  Getting an infectious  disease. Developing a transfusion reaction. This is an allergic reaction to something in  the blood you were given. Every precaution is taken to prevent this. The decision to have a blood transfusion has been considered carefully by your caregiver before blood is given. Blood is not given unless the benefits outweigh the risks. AFTER THE TRANSFUSION Right after receiving a blood transfusion, you will usually feel much better and more energetic. This is especially true if your red blood cells have gotten low (anemic). The transfusion raises the level of the red blood cells which carry oxygen, and this usually causes an energy increase. The nurse administering the transfusion will monitor you carefully for complications. HOME CARE INSTRUCTIONS  No special instructions are needed after a transfusion. You may find your energy is better. Speak with your caregiver about any limitations on activity for underlying diseases you may have. SEEK MEDICAL CARE IF:  Your condition is not improving after your transfusion. You develop redness or irritation at the intravenous (IV) site. SEEK IMMEDIATE MEDICAL CARE IF:  Any of the following symptoms occur over the next 12 hours: Shaking chills. You have a temperature by mouth above 102 F (38.9 C), not controlled by medicine. Chest, back, or muscle pain. People around you feel you are not acting correctly or are confused. Shortness of breath or difficulty breathing. Dizziness and fainting. You get a rash or develop hives. You have a decrease in urine output. Your urine turns a dark color or changes to pink, red, or brown. Any of the following symptoms occur over the next 10 days: You have a temperature by mouth above 102 F (38.9 C), not controlled by medicine. Shortness of breath. Weakness after normal activity. The white part of the eye turns yellow (jaundice). You have a decrease in the amount of urine or are urinating less often. Your urine turns a  dark color or changes to pink, red, or brown. Document Released: 01/09/2000 Document Revised: 04/05/2011 Document Reviewed: 08/28/2007 [facility] Patient Information 2014 [place], [place].  _______________________________________________________________________

## 2021-02-02 ENCOUNTER — Ambulatory Visit: Payer: PPO | Admitting: Urology

## 2021-02-03 ENCOUNTER — Encounter (HOSPITAL_COMMUNITY)
Admission: RE | Admit: 2021-02-03 | Discharge: 2021-02-03 | Disposition: A | Discharge status: Home / Self Care | Payer: PPO | Source: Ambulatory Visit | Attending: Orthopedic Surgery | Admitting: Orthopedic Surgery

## 2021-02-03 ENCOUNTER — Encounter (HOSPITAL_COMMUNITY): Payer: Self-pay

## 2021-02-03 ENCOUNTER — Other Ambulatory Visit: Payer: Self-pay

## 2021-02-03 VITALS — BP 151/68 | HR 63 | Temp 98.3°F | Resp 16 | Ht 61.5 in | Wt 196.8 lb

## 2021-02-03 DIAGNOSIS — K219 Gastro-esophageal reflux disease without esophagitis: Secondary | ICD-10-CM | POA: Diagnosis not present

## 2021-02-03 DIAGNOSIS — Z79899 Other long term (current) drug therapy: Secondary | ICD-10-CM | POA: Diagnosis not present

## 2021-02-03 DIAGNOSIS — Z7982 Long term (current) use of aspirin: Secondary | ICD-10-CM | POA: Diagnosis not present

## 2021-02-03 DIAGNOSIS — I4510 Unspecified right bundle-branch block: Secondary | ICD-10-CM | POA: Insufficient documentation

## 2021-02-03 DIAGNOSIS — I471 Supraventricular tachycardia: Secondary | ICD-10-CM | POA: Insufficient documentation

## 2021-02-03 DIAGNOSIS — Z01818 Encounter for other preprocedural examination: Secondary | ICD-10-CM

## 2021-02-03 DIAGNOSIS — M1712 Unilateral primary osteoarthritis, left knee: Secondary | ICD-10-CM | POA: Insufficient documentation

## 2021-02-03 DIAGNOSIS — I2510 Atherosclerotic heart disease of native coronary artery without angina pectoris: Secondary | ICD-10-CM | POA: Diagnosis not present

## 2021-02-03 DIAGNOSIS — I10 Essential (primary) hypertension: Secondary | ICD-10-CM | POA: Diagnosis not present

## 2021-02-03 DIAGNOSIS — Z87891 Personal history of nicotine dependence: Secondary | ICD-10-CM | POA: Insufficient documentation

## 2021-02-03 DIAGNOSIS — Z01812 Encounter for preprocedural laboratory examination: Secondary | ICD-10-CM | POA: Diagnosis not present

## 2021-02-03 HISTORY — DX: Unspecified right bundle-branch block: I45.10

## 2021-02-03 HISTORY — DX: Hypothyroidism, unspecified: E03.9

## 2021-02-03 HISTORY — DX: Depression, unspecified: F32.A

## 2021-02-03 HISTORY — DX: Atherosclerotic heart disease of native coronary artery without angina pectoris: I25.10

## 2021-02-03 HISTORY — DX: Unspecified osteoarthritis, unspecified site: M19.90

## 2021-02-03 LAB — CBC
HCT: 43.4 % (ref 36.0–46.0)
Hemoglobin: 14.4 g/dL (ref 12.0–15.0)
MCH: 31.7 pg (ref 26.0–34.0)
MCHC: 33.2 g/dL (ref 30.0–36.0)
MCV: 95.6 fL (ref 80.0–100.0)
Platelets: 408 10*3/uL — ABNORMAL HIGH (ref 150–400)
RBC: 4.54 MIL/uL (ref 3.87–5.11)
RDW: 12.3 % (ref 11.5–15.5)
WBC: 13.4 10*3/uL — ABNORMAL HIGH (ref 4.0–10.5)
nRBC: 0 % (ref 0.0–0.2)

## 2021-02-03 LAB — COMPREHENSIVE METABOLIC PANEL
ALT: 27 U/L (ref 0–44)
AST: 19 U/L (ref 15–41)
Albumin: 4 g/dL (ref 3.5–5.0)
Alkaline Phosphatase: 69 U/L (ref 38–126)
Anion gap: 8 (ref 5–15)
BUN: 16 mg/dL (ref 8–23)
CO2: 27 mmol/L (ref 22–32)
Calcium: 9.1 mg/dL (ref 8.9–10.3)
Chloride: 101 mmol/L (ref 98–111)
Creatinine, Ser: 0.54 mg/dL (ref 0.44–1.00)
GFR, Estimated: >60 mL/min (ref >60)
Glucose, Bld: 102 mg/dL — ABNORMAL HIGH (ref 70–99)
Potassium: 4.5 mmol/L (ref 3.5–5.1)
Sodium: 136 mmol/L (ref 135–145)
Total Bilirubin: 0.7 mg/dL (ref 0.3–1.2)
Total Protein: 7.1 g/dL (ref 6.5–8.1)

## 2021-02-03 LAB — SURGICAL PCR SCREEN
MRSA, PCR: NEGATIVE
Staphylococcus aureus: NEGATIVE

## 2021-02-03 LAB — TYPE AND SCREEN
ABO/RH(D): A POS
Antibody Screen: NEGATIVE

## 2021-02-04 ENCOUNTER — Ambulatory Visit: Payer: PPO

## 2021-02-04 ENCOUNTER — Other Ambulatory Visit: Payer: Self-pay

## 2021-02-04 MED ORDER — DEXLANSOPRAZOLE 60 MG PO CPDR: 60.0000 mg | DELAYED_RELEASE_CAPSULE | Freq: Every day | ORAL | 1 refills | Status: DC

## 2021-02-05 NOTE — Anesthesia Preprocedure Evaluation (Addendum)
Anesthesia Evaluation  Patient identified by MRN, date of birth, ID band Patient awake    Reviewed: Allergy & Precautions, NPO status , Patient's Chart, lab work & pertinent test results  History of Anesthesia Complications Negative for: history of anesthetic complications  Airway Mallampati: II  TM Distance: >3 FB Neck ROM: Full    Dental no notable dental hx.    Pulmonary former smoker,    Pulmonary exam normal        Cardiovascular hypertension, Pt. on medications + CAD  Normal cardiovascular exam+ dysrhythmias Supra Ventricular Tachycardia   Incomplete RBBB   Neuro/Psych Anxiety Depression Bipolar Disorder negative neurological ROS     GI/Hepatic Neg liver ROS, GERD  Controlled and Medicated,  Endo/Other  Hypothyroidism   Renal/GU negative Renal ROS  negative genitourinary   Musculoskeletal  (+) Arthritis ,   Abdominal   Peds  Hematology negative hematology ROS (+)   Anesthesia Other Findings Day of surgery medications reviewed with patient.  Reproductive/Obstetrics negative OB ROS                           Anesthesia Physical Anesthesia Plan  ASA: 2  Anesthesia Plan: Spinal   Post-op Pain Management: Ofirmev IV (intra-op) and Regional block   Induction:   PONV Risk Score and Plan: 3 and Treatment may vary due to age or medical condition, Ondansetron, Propofol infusion and Dexamethasone  Airway Management Planned: Natural Airway and Simple Face Mask  Additional Equipment: None  Intra-op Plan:   Post-operative Plan:   Informed Consent: I have reviewed the patients History and Physical, chart, labs and discussed the procedure including the risks, benefits and alternatives for the proposed anesthesia with the patient or authorized representative who has indicated his/her understanding and acceptance.       Plan Discussed with: CRNA  Anesthesia Plan Comments: (See PAT  note 02/03/2021, Konrad Felix Ward, PA-C)      Anesthesia Quick Evaluation

## 2021-02-05 NOTE — Progress Notes (Signed)
Anesthesia Chart Review   Case: [id number] Date/Time: 02/16/21 1120   Procedure: TOTAL KNEE ARTHROPLASTY (Left: Knee)   Anesthesia type: Choice   Pre-op diagnosis: Left knee osteoarthritis   Location: [facility] ROOM 09 / [facility]   Surgeons: Gaynelle Arabian, MD       DISCUSSION:76 y.o. former smoker with h/o GERD, HTN, CAD, RBBB, PSVT, left knee OA scheduled for above procedure 02/16/2021 with Dr. Gaynelle Arabian.   Pt last seen by cardiology 11/17/2020. Per OV note, "Preop clearance for total knee replacement by Dr. Wynelle Link 02/16/2021.  With minimal CAD on coronary CTA 04/16/2020 calcium score 86 0 to 24% proximal LAD, normal LVEF on echo 06/2019.  No chest pain or cardiac complaints.  She is on a statin and aspirin.  METs are greater than 4.  No need for further cardiac work-up prior to knee surgery unless her symptoms change in the interim. According to the Revised Cardiac Risk Index (RCRI), her Perioperative Risk of Major Cardiac Event is (%): 0.9   Her Functional Capacity in METs is: 4.4 according to the Duke Activity Status Index (DASI)."  Anticipate pt can proceed with planned procedure barring acute status change.   VS: BP (!) 151/68    Pulse 63    Temp 36.8 C (Oral)    Resp 16    Ht 5' 1.5" (1.562 m)    Wt 89.3 kg    SpO2 100%    BMI 36.58 kg/m   PROVIDERS: Lindell Spar, MD is PCP   Rozann Lesches, MD is Cardiologist  LABS: Labs reviewed: Acceptable for surgery. (all labs ordered are listed, but only abnormal results are displayed)  Labs Reviewed  CBC - Abnormal; Notable for the following components:      Result Value   WBC 13.4 (*)    Platelets 408 (*)    All other components within normal limits  COMPREHENSIVE METABOLIC PANEL - Abnormal; Notable for the following components:   Glucose, Bld 102 (*)    All other components within normal limits  SURGICAL PCR SCREEN  TYPE AND SCREEN     IMAGES:   EKG: 11/17/2020 Rate 80 bpm  Sinus rhythm with premature atrial  complexes Incomplete right bundle branch block   CV: Echo 07/02/2019  1. Left ventricular ejection fraction, by estimation, is 65 to 70%. The  left ventricle has normal function. The left ventricle has no regional  wall motion abnormalities. There is mild left ventricular hypertrophy.  Left ventricular diastolic parameters  are indeterminate.   2. Right ventricular systolic function is normal. The right ventricular  size is normal. Tricuspid regurgitation signal is inadequate for assessing  PA pressure.   3. Left atrial size was mildly dilated.   4. The mitral valve is grossly normal. Trivial mitral valve  regurgitation.   5. The aortic valve is tricuspid. Aortic valve regurgitation is not  visualized.   6. The inferior vena cava is normal in size with greater than 50%  respiratory variability, suggesting right atrial pressure of 3 mmHg.  Past Medical History:  Diagnosis Date   Allergic rhinitis    Arthritis    B12 deficiency    Bipolar disorder ([place])    Coronary artery disease    Depression    Essential hypertension    GERD (gastroesophageal reflux disease)    Hashimoto's thyroiditis    History of transient ischemic attack (TIA)    Or possibly migraine as well as right amaurosis fugax and ataxia - Dr. Erling Cruz  Hyperlipidemia    Hypothyroidism    PSVT (paroxysmal supraventricular tachycardia) ([place])    Right bundle branch block     Past Surgical History:  Procedure Laterality Date   CATARACT EXTRACTION W/PHACO Left 05/23/2020   Procedure: CATARACT EXTRACTION PHACO AND INTRAOCULAR LENS PLACEMENT LEFT EYE;  Surgeon: Baruch Goldmann, MD;  Location: [facility];  Service: Ophthalmology;  Laterality: Left;  left CDE=15.69   CATARACT EXTRACTION W/PHACO Right 06/13/2020   Procedure: CATARACT EXTRACTION PHACO AND INTRAOCULAR LENS PLACEMENT RIGHT EYE;  Surgeon: Baruch Goldmann, MD;  Location: [facility];  Service: Ophthalmology;  Laterality: Right;  right CDE=11.33   LAPAROSCOPIC CHOLECYSTECTOMY   2010   THYROIDECTOMY  2009    MEDICATIONS:  acetaminophen (TYLENOL) 500 MG tablet   albuterol (VENTOLIN HFA) 108 (90 Base) MCG/ACT inhaler   alendronate (FOSAMAX) 70 MG tablet   atorvastatin (LIPITOR) 80 MG tablet   B Complex-C (SUPER B COMPLEX PO)   buPROPion (WELLBUTRIN XL) 300 MG 24 hr tablet   calcium carbonate (TUMS - DOSED IN MG ELEMENTAL CALCIUM) 500 MG chewable tablet   Cholecalciferol (VITAMIN D3) 50 MCG (2000 UT) capsule   clonazePAM (KLONOPIN) 0.5 MG tablet   dexlansoprazole (DEXILANT) 60 MG capsule   diltiazem (CARDIZEM CD) 240 MG 24 hr capsule   DULoxetine (CYMBALTA) 60 MG capsule   levothyroxine (SYNTHROID) 150 MCG tablet   losartan (COZAAR) 50 MG tablet   LYBALVI 5-10 MG TABS   mirabegron ER (MYRBETRIQ) 25 MG TB24 tablet   Multiple Vitamins-Minerals (ICAPS AREDS 2 PO)   Multiple Vitamins-Minerals (PRESERVISION AREDS) TABS   pantoprazole (PROTONIX) 40 MG tablet   saccharomyces boulardii (FLORASTOR) 250 MG capsule   Vitamin D-Vitamin K (VITAMIN K2-VITAMIN D3 PO)   No current facility-administered medications for this encounter.     Konrad Felix Ward, PA-C [facility]-Surgical Testing [phone]

## 2021-02-10 ENCOUNTER — Telehealth: Payer: Self-pay

## 2021-02-10 ENCOUNTER — Other Ambulatory Visit: Payer: Self-pay | Admitting: Urology

## 2021-02-10 DIAGNOSIS — N3942 Incontinence without sensory awareness: Secondary | ICD-10-CM

## 2021-02-10 MED ORDER — SOLIFENACIN SUCCINATE 10 MG PO TABS: 10.0000 mg | ORAL_TABLET | Freq: Every day | ORAL | 11 refills | Status: DC

## 2021-02-10 NOTE — Telephone Encounter (Signed)
Patient states she has to have knee surgery on 02/16/21 and request something to help with her incontinence.  Per last OV note pt did not respond well to myrbetriq or gemtesa and was referred to [facility]. Patient states she has not had the test done but has contacted them. Patient states anything you can do to help her will be appreciated.

## 2021-02-11 NOTE — Telephone Encounter (Signed)
Patient called and made aware.

## 2021-02-12 ENCOUNTER — Other Ambulatory Visit: Payer: Self-pay

## 2021-02-12 ENCOUNTER — Encounter (HOSPITAL_COMMUNITY)
Admission: RE | Admit: 2021-02-12 | Discharge: 2021-02-12 | Disposition: A | Discharge status: Home / Self Care | Payer: PPO | Source: Ambulatory Visit | Attending: Orthopedic Surgery | Admitting: Orthopedic Surgery

## 2021-02-12 DIAGNOSIS — Z01812 Encounter for preprocedural laboratory examination: Secondary | ICD-10-CM | POA: Diagnosis not present

## 2021-02-12 DIAGNOSIS — Z01818 Encounter for other preprocedural examination: Secondary | ICD-10-CM

## 2021-02-12 DIAGNOSIS — Z20822 Contact with and (suspected) exposure to covid-19: Secondary | ICD-10-CM | POA: Diagnosis not present

## 2021-02-12 LAB — SARS CORONAVIRUS 2 (TAT 6-24 HRS): SARS Coronavirus 2: NEGATIVE

## 2021-02-13 ENCOUNTER — Ambulatory Visit (INDEPENDENT_AMBULATORY_CARE_PROVIDER_SITE_OTHER): Payer: PPO

## 2021-02-13 ENCOUNTER — Telehealth: Payer: Self-pay | Admitting: Internal Medicine

## 2021-02-13 ENCOUNTER — Other Ambulatory Visit: Payer: Self-pay

## 2021-02-13 DIAGNOSIS — Z0000 Encounter for general adult medical examination without abnormal findings: Secondary | ICD-10-CM

## 2021-02-13 NOTE — Patient Instructions (Addendum)
[name] , Thank you for taking time to come for your Medicare Wellness Visit. I appreciate your ongoing commitment to your health goals. Please review the following plan we discussed and let me know if I can assist you in the future.   These are the goals we discussed:  Goals   None     This is a list of the screening recommended for you and due dates:  Health Maintenance  Topic Date Due   Zoster (Shingles) Vaccine (1 of 2) Never done   COVID-19 Vaccine (3 - Mixed Product [name] series) 04/16/2019   Pneumonia Vaccine (2 - PPSV23 if available, else PCV20) 04/21/2021   Tetanus Vaccine  04/21/2021*   Mammogram  06/05/2022   Cologuard (Stool DNA test)  11/26/2023   Flu Shot  Completed   DEXA scan (bone density measurement)  Completed   Hepatitis C Screening: USPSTF Recommendation to screen - Ages 65-79 yo.  Completed   HPV Vaccine  Aged Out  *Topic was postponed. The date shown is not the original due date.    Health Maintenance, Female Adopting a healthy lifestyle and getting preventive care are important in promoting health and wellness. Ask your health care provider about: The right schedule for you to have regular tests and exams. Things you can do on your own to prevent diseases and keep yourself healthy. What should I know about diet, weight, and exercise? Eat a healthy diet  Eat a diet that includes plenty of vegetables, fruits, low-fat dairy products, and lean protein. Do not eat a lot of foods that are high in solid fats, added sugars, or sodium. Maintain a healthy weight Body mass index (BMI) is used to identify weight problems. It estimates body fat based on height and weight. Your health care provider can help determine your BMI and help you achieve or maintain a healthy weight. Get regular exercise Get regular exercise. This is one of the most important things you can do for your health. Most adults should: Exercise for at least 150 minutes each week. The exercise  should increase your heart rate and make you sweat (moderate-intensity exercise). Do strengthening exercises at least twice a week. This is in addition to the moderate-intensity exercise. Spend less time sitting. Even light physical activity can be beneficial. Watch cholesterol and blood lipids Have your blood tested for lipids and cholesterol at 75 years of age, then have this test every 5 years. Have your cholesterol levels checked more often if: Your lipid or cholesterol levels are high. You are older than 75 years of age. You are at high [name] for heart disease. What should I know about cancer screening? Depending on your health history and family history, you may need to have cancer screening at various ages. This may include screening for: Breast cancer. Cervical cancer. Colorectal cancer. Skin cancer. Lung cancer. What should I know about heart disease, diabetes, and high blood pressure? Blood pressure and heart disease High blood pressure causes heart disease and increases the [name] of stroke. This is more likely to develop in people who have high blood pressure readings or are overweight. Have your blood pressure checked: Every 3-5 years if you are 40-33 years of age. Every year if you are 88 years old or older. Diabetes Have regular diabetes screenings. This checks your fasting blood sugar level. Have the screening done: Once every three years after age 23 if you are at a normal weight and have a low [name] for diabetes. More often and  at a younger age if you are overweight or have a high [name] for diabetes. What should I know about preventing infection? Hepatitis B If you have a higher [name] for hepatitis B, you should be screened for this virus. Talk with your health care provider to find out if you are at [name] for hepatitis B infection. Hepatitis C Testing is recommended for: Everyone born from [birth year] through 1964. Anyone with known [name] factors for hepatitis C. Sexually  transmitted infections (STIs) Get screened for STIs, including gonorrhea and chlamydia, if: You are sexually active and are younger than 75 years of age. You are older than 75 years of age and your health care provider tells you that you are at [name] for this type of infection. Your sexual activity has changed since you were last screened, and you are at increased [name] for chlamydia or gonorrhea. Ask your health care provider if you are at [name]. Ask your health care provider about whether you are at high [name] for HIV. Your health care provider may recommend a prescription medicine to help prevent HIV infection. If you choose to take medicine to prevent HIV, you should first get tested for HIV. You should then be tested every 3 months for as long as you are taking the medicine. Pregnancy If you are about to stop having your period (premenopausal) and you may become pregnant, seek counseling before you get pregnant. Take 400 to 800 micrograms (mcg) of folic acid every day if you become pregnant. Ask for birth control (contraception) if you want to prevent pregnancy. Osteoporosis and menopause Osteoporosis is a disease in which the bones lose minerals and strength with aging. This can result in bone fractures. If you are 34 years old or older, or if you are at [name] for osteoporosis and fractures, ask your health care provider if you should: Be screened for bone loss. Take a calcium or vitamin D supplement to lower your [name] of fractures. Be given hormone replacement therapy (HRT) to treat symptoms of menopause. Follow these instructions at home: Alcohol use Do not drink alcohol if: Your health care provider tells you not to drink. You are pregnant, may be pregnant, or are planning to become pregnant. If you drink alcohol: Limit how much you have to: 0-1 drink a day. Know how much alcohol is in your drink. In the [place] one drink equals one 12 oz bottle of beer (355 mL), one 5 oz glass of wine (148  mL), or one 1 oz glass of hard liquor (44 mL). Lifestyle Do not use any products that contain nicotine or tobacco. These products include cigarettes, chewing tobacco, and vaping devices, such as e-cigarettes. If you need help quitting, ask your health care provider. Do not use street drugs. Do not share needles. Ask your health care provider for help if you need support or information about quitting drugs. General instructions Schedule regular health, dental, and eye exams. Stay current with your vaccines. Tell your health care provider if: You often feel depressed. You have ever been abused or do not feel safe at home. Summary Adopting a healthy lifestyle and getting preventive care are important in promoting health and wellness. Follow your health care provider's instructions about healthy diet, exercising, and getting tested or screened for diseases. Follow your health care provider's instructions on monitoring your cholesterol and blood pressure. This information is not intended to replace advice given to you by your health care provider. Make sure you discuss any questions you have with your  health care provider. Document Revised: 06/02/2020 Document Reviewed: 06/02/2020 Elsevier Patient Education  [place].

## 2021-02-13 NOTE — Telephone Encounter (Signed)
PT called in to state that daughter Johnnye Lana and son Salome Arnt are POA   In regard to [facility] 02/13/21

## 2021-02-13 NOTE — Progress Notes (Addendum)
Subjective:   [name] is a 76 y.o. female who presents for Medicare Annual (Subsequent) preventive examination. I connected with  [name] on 02/13/21 by a audio enabled telemedicine application and verified that I am speaking with the correct person using two identifiers.  Patient Location: Home  Provider Location: Office/Clinic  I discussed the limitations of evaluation and management by telemedicine. The patient expressed understanding and agreed to proceed.  Review of Systems    Defer to PCP Cardiac Risk Factors include: advanced age (>67men, >70 women);hypertension;smoking/ tobacco exposure     Objective:    Today's Vitals   02/13/21 1338  PainSc: 0-No pain   There is no height or weight on file to calculate BMI.  Advanced Directives 02/13/2021 02/03/2021 06/13/2020 05/23/2020 12/29/2019 11/05/2019 10/24/2019  Does Patient Have a Medical Advance Directive? No Yes No No No No No  Type of Advance Directive - [place] in Chart? - No - copy requested - - - - -  Would patient like information on creating a medical advance directive? No - Patient declined - No - Patient declined No - Patient declined Yes (ED - Information included in AVS) No - Patient declined -    Current Medications (verified) Outpatient Encounter Medications as of 02/13/2021  Medication Sig   acetaminophen (TYLENOL) 500 MG tablet Take 1,000 mg by mouth every 6 (six) hours as needed for moderate pain or headache.   albuterol (VENTOLIN HFA) 108 (90 Base) MCG/ACT inhaler Inhale 2 puffs into the lungs every 6 (six) hours as needed for wheezing or shortness of breath.   alendronate (FOSAMAX) 70 MG tablet Take 1 tablet (70 mg total) by mouth every 7 (seven) days. Take with a full glass of water on an empty stomach.   atorvastatin (LIPITOR) 80 MG tablet Take 1 tablet (80 mg total) by mouth daily.   B Complex-C (SUPER B COMPLEX PO) Take 1  tablet by mouth daily.   buPROPion (WELLBUTRIN XL) 300 MG 24 hr tablet Take 300 mg by mouth daily.    calcium carbonate (TUMS - DOSED IN MG ELEMENTAL CALCIUM) 500 MG chewable tablet Chew 4 tablets by mouth daily.   Cholecalciferol (VITAMIN D3) 50 MCG (2000 UT) capsule Take 2,000 Units by mouth daily.   clonazePAM (KLONOPIN) 0.5 MG tablet Take 0.25-0.5 mg by mouth in the morning, at noon, and at bedtime.   dexlansoprazole (DEXILANT) 60 MG capsule Take 1 capsule (60 mg total) by mouth daily.   diltiazem (CARDIZEM CD) 240 MG 24 hr capsule TAKE (1) CAPSULE BY MOUTH ONCE DAILY.   DULoxetine (CYMBALTA) 60 MG capsule Take 60 mg by mouth 2 (two) times daily.    levothyroxine (SYNTHROID) 150 MCG tablet Take 1 tablet (150 mcg total) by mouth daily.   losartan (COZAAR) 50 MG tablet Take 1 tablet (50 mg total) by mouth in the morning and at bedtime.   LYBALVI 5-10 MG TABS Take 1 tablet by mouth at bedtime.   Multiple Vitamins-Minerals (ICAPS AREDS 2 PO) Take 1 capsule by mouth 2 (two) times daily.   Multiple Vitamins-Minerals (PRESERVISION AREDS) TABS Take by mouth.   pantoprazole (PROTONIX) 40 MG tablet Take 40 mg by mouth daily before breakfast.   saccharomyces boulardii (FLORASTOR) 250 MG capsule Take 250 mg by mouth daily.   solifenacin (VESICARE) 10 MG tablet Take 1 tablet (10 mg total) by mouth daily.   Vitamin D-Vitamin K (  VITAMIN K2-VITAMIN D3 PO) Take 1 tablet by mouth daily.   No facility-administered encounter medications on file as of 02/13/2021.    Allergies (verified) Amoxicillin-pot clavulanate, Atenolol, Penicillins, and Sulfonamide derivatives   History: Past Medical History:  Diagnosis Date   Allergic rhinitis    Arthritis    B12 deficiency    Bipolar disorder (HCC)    Coronary artery disease    Depression    Essential hypertension    GERD (gastroesophageal reflux disease)    Hashimoto's thyroiditis    History of transient ischemic attack (TIA)    Or possibly migraine as  well as right amaurosis fugax and ataxia - Dr. Erling Cruz   Hyperlipidemia    Hypothyroidism    PSVT (paroxysmal supraventricular tachycardia) ([facility])    Right bundle branch block    Past Surgical History:  Procedure Laterality Date   CATARACT EXTRACTION W/PHACO Left 05/23/2020   Procedure: CATARACT EXTRACTION PHACO AND INTRAOCULAR LENS PLACEMENT LEFT EYE;  Surgeon: Baruch Goldmann, MD;  Location: [facility];  Service: Ophthalmology;  Laterality: Left;  left CDE=15.69   CATARACT EXTRACTION W/PHACO Right 06/13/2020   Procedure: CATARACT EXTRACTION PHACO AND INTRAOCULAR LENS PLACEMENT RIGHT EYE;  Surgeon: Baruch Goldmann, MD;  Location: [facility];  Service: Ophthalmology;  Laterality: Right;  right CDE=11.33   LAPAROSCOPIC CHOLECYSTECTOMY  2010   THYROIDECTOMY  2009   Family History  Problem Relation Age of Onset   Stroke Mother    Thyroid disease Mother    Depression Father    Hypertension Sister    Aneurysm Sister    Diabetes Brother    Heart attack Brother    Hypertension Brother    Heart failure Brother    Thyroid disease Brother    Stroke Maternal Grandmother    Heart failure Maternal Grandmother    Heart failure Maternal Grandfather    Stroke Maternal Grandfather    Heart failure Paternal Grandmother    Social History   Socioeconomic History   Marital status: Widowed    Spouse name: Not on file   Number of children: 2   Years of education: 32   Highest education level: Not on file  Occupational History   Occupation: homemaker    Employer: RETIRED  Tobacco Use   Smoking status: Former    Packs/day: 0.50    Years: 10.00    Pack years: 5.00    Types: Cigarettes    Quit date: 01/26/1983    Years since quitting: 38.0   Smokeless tobacco: Never   Tobacco comments:    quit 30 years ago  Vaping Use   Vaping Use: Never used  Substance and Sexual Activity   Alcohol use: No    Alcohol/week: 0.0 standard drinks   Drug use: No   Sexual activity: Not Currently    Birth  control/protection: Post-menopausal  Other Topics Concern   Not on file  Social History Narrative   Regular exercise: walk when able   Caffeine use: occasionally   Social Determinants of Health   Financial Resource Strain: Low Risk    Difficulty of Paying Living Expenses: Not hard at all  Food Insecurity: No Food Insecurity   Worried About Charity fundraiser in the Last Year: Never true   [place] in the Last Year: Never true  Transportation Needs: No Transportation Needs   Lack of Transportation (Medical): No   Lack of Transportation (Non-Medical): No  Physical Activity: Inactive   Days of Exercise per Week: 0 days  Minutes of Exercise per Session: 0 min  Stress: No Stress Concern Present   Feeling of Stress : Not at all  Social Connections: Moderately Isolated   Frequency of Communication with Friends and Family: More than three times a week   Frequency of Social Gatherings with Friends and Family: Never   Attends Religious Services: More than 4 times per year   Active Member of [employer] or Organizations: No   Attends Archivist Meetings: Never   Marital Status: Widowed    Tobacco Counseling Counseling given: Not Answered Tobacco comments: quit 30 years ago   Clinical Intake:  Pre-visit preparation completed: No  Pain : No/denies pain Pain Score: 0-No pain     Diabetes: No  How often do you need to have someone help you when you read instructions, pamphlets, or other written materials from your doctor or pharmacy?: 1 - Never What is the last grade level you completed in school?: 12  Diabetic?no  Interpreter Needed?: No  Information entered by :: [place] of Daily Living In your present state of health, do you have any difficulty performing the following activities: 02/13/2021 02/03/2021  Hearing? N N  Vision? N N  Difficulty concentrating or making decisions? N Y  Comment - Short term memory issues  Walking or climbing stairs?  Y Y  Comment - Due to knee pain  Dressing or bathing? N N  Doing errands, shopping? Y N  Preparing Food and eating ? N -  Using the Toilet? N -  In the past six months, have you accidently leaked urine? N -  Do you have problems with loss of bowel control? N -  Managing your Medications? N -  Managing your Finances? N -  Housekeeping or managing your Housekeeping? N -  Some recent data might be hidden    Patient Care Team: Lindell Spar, MD as PCP - General (Internal Medicine) Satira Sark, MD as PCP - Cardiology (Cardiology)  Indicate any recent Medical Services you may have received from other than [facility] providers in the past year (date may be approximate).     Assessment:   This is a routine wellness examination for Dante.  Hearing/Vision screen No results found.  Dietary issues and exercise activities discussed: Current Exercise Habits: Home exercise routine, Type of exercise: walking, Exercise limited by: None identified   Goals Addressed   None   Depression Screen PHQ 2/9 Scores 02/13/2021 01/14/2021 10/30/2020 09/30/2020 08/19/2020 06/18/2020 06/09/2020  PHQ - 2 Score 0 0 0 0 0 0 0  PHQ- 9 Score 1 3 - - - - -    Fall Risk Fall Risk  02/13/2021 01/14/2021 10/30/2020 10/30/2020 09/30/2020  Falls in the past year? 1 1 0 0 0  Comment - - - - -  Number falls in past yr: 1 1 0 0 0  Injury with Fall? 1 0 0 0 0  Risk for fall due to : History of fall(s);Impaired balance/gait;Orthopedic patient - No Fall Risks No Fall Risks No Fall Risks  Follow up Falls evaluation completed - - Falls evaluation completed Falls evaluation completed    [place]:  Any stairs in or around the home? Yes  If so, are there any without handrails? Yes  Home free of loose throw rugs in walkways, pet beds, electrical cords, etc? Yes  Adequate lighting in your home to reduce risk of falls? Yes   ASSISTIVE DEVICES UTILIZED TO PREVENT FALLS:  Life alert? No  Use  of a cane, walker or w/c? Yes  Grab bars in the bathroom? Yes  Shower chair or bench in shower? Yes  Elevated toilet seat or a handicapped toilet? Yes    Cognitive Function:   Montreal Cognitive Assessment  02/19/2014  Visuospatial/ Executive (0/5) 2  Naming (0/3) 2  Attention: Read list of digits (0/2) 2  Attention: Read list of letters (0/1) 1  Attention: Serial 7 subtraction starting at 100 (0/3) 2  Language: Repeat phrase (0/2) 2  Language : Fluency (0/1) 1  Abstraction (0/2) 1  Delayed Recall (0/5) 2  Orientation (0/6) 5  Total 20  Adjusted Score (based on education) 21   6CIT Screen 02/13/2021  What Year? 0 points  What month? 0 points  What time? 0 points  Count back from 20 0 points  Months in reverse 4 points  Repeat phrase 0 points  Total Score 4    Immunizations Immunization History  Administered Date(s) Administered   Fluad Quad(high Dose 65+) 10/30/2020   Pneumococcal Conjugate-13 04/21/2020    TDAP status: Due, Education has been provided regarding the importance of this vaccine. Advised may receive this vaccine at local pharmacy or Health Dept. Aware to provide a copy of the vaccination record if obtained from local pharmacy or Health Dept. Verbalized acceptance and understanding.  Flu Vaccine status: Up to date  Pneumococcal vaccine status: Due, Education has been provided regarding the importance of this vaccine. Advised may receive this vaccine at local pharmacy or Health Dept. Aware to provide a copy of the vaccination record if obtained from local pharmacy or Health Dept. Verbalized acceptance and understanding.  Covid-19 vaccine status: Information provided on how to obtain vaccines.   Qualifies for Shingles Vaccine? Yes   Zostavax completed No   Shingrix Completed?: No.    Education has been provided regarding the importance of this vaccine. Patient has been advised to call insurance company to determine out of pocket expense if they have not yet  received this vaccine. Advised may also receive vaccine at local pharmacy or Health Dept. Verbalized acceptance and understanding.  Screening Tests Health Maintenance  Topic Date Due   Zoster Vaccines- Shingrix (1 of 2) Never done   COVID-19 Vaccine (3 - Mixed Product risk series) 04/16/2019   Pneumonia Vaccine 43+ Years old (2 - PPSV23 if available, else PCV20) 04/21/2021   TETANUS/TDAP  04/21/2021 (Originally 09/12/1964)   MAMMOGRAM  06/05/2022   Fecal DNA (Cologuard)  11/26/2023   INFLUENZA VACCINE  Completed   DEXA SCAN  Completed   Hepatitis C Screening  Completed   HPV VACCINES  Aged Out    Health Maintenance  Health Maintenance Due  Topic Date Due   Zoster Vaccines- Shingrix (1 of 2) Never done   COVID-19 Vaccine (3 - Mixed Product risk series) 04/16/2019   Pneumonia Vaccine 77+ Years old (2 - PPSV23 if available, else PCV20) 04/21/2021    Colorectal cancer screening: Type of screening: Cologuard. Completed 11/25/2020. Repeat every 3 years  Mammogram status: Completed 06/04/2020. Repeat every year  Bone Density status: Completed 05/11. Results reflect: Bone density results: OSTEOPOROSIS. Repeat every 1 years.  Lung Cancer Screening: (Low Dose CT Chest recommended if Age 31-80 years, 30 pack-year currently smoking OR have quit w/in 15years.) does not qualify.   Lung Cancer Screening Referral: n/a  Additional Screening:  Hepatitis C Screening: does qualify; Completed 08/20/2020  Vision Screening: Recommended annual ophthalmology exams for early detection of glaucoma and other  disorders of the eye. Is the patient up to date with their annual eye exam?  Yes  Who is the provider or what is the name of the office in which the patient attends annual eye exams? [facility] If pt is not established with a provider, would they like to be referred to a provider to establish care? No .   Dental Screening: Recommended annual dental exams for proper oral hygiene  Community  Resource Referral / Chronic Care Management: CRR required this visit?  No   CCM required this visit?  No      Plan:     I have personally reviewed and noted the following in the patients chart:   Medical and social history Use of alcohol, tobacco or illicit drugs  Current medications and supplements including opioid prescriptions.  Functional ability and status Nutritional status Physical activity Advanced directives List of other physicians Hospitalizations, surgeries, and ER visits in previous 12 months Vitals Screenings to include cognitive, depression, and falls Referrals and appointments  In addition, I have reviewed and discussed with patient certain preventive protocols, quality metrics, and best practice recommendations. A written personalized care plan for preventive services as well as general preventive health recommendations were provided to patient.     Earline Mayotte, [place]   02/13/2021   Nurse Notes:  Ms. Turnbaugh , Thank you for taking time to come for your Medicare Wellness Visit. I appreciate your ongoing commitment to your health goals. Please review the following plan we discussed and let me know if I can assist you in the future.   These are the goals we discussed:  Goals   None     This is a list of the screening recommended for you and due dates:  Health Maintenance  Topic Date Due   Zoster (Shingles) Vaccine (1 of 2) Never done   COVID-19 Vaccine (3 - Mixed Product risk series) 04/16/2019   Pneumonia Vaccine (2 - PPSV23 if available, else PCV20) 04/21/2021   Tetanus Vaccine  04/21/2021*   Mammogram  06/05/2022   Cologuard (Stool DNA test)  11/26/2023   Flu Shot  Completed   DEXA scan (bone density measurement)  Completed   Hepatitis C Screening: USPSTF Recommendation to screen - Ages 84-79 yo.  Completed   HPV Vaccine  Aged Out  *Topic was postponed. The date shown is not the original due date.

## 2021-02-16 ENCOUNTER — Other Ambulatory Visit: Payer: Self-pay

## 2021-02-16 ENCOUNTER — Ambulatory Visit (HOSPITAL_COMMUNITY): Payer: PPO | Admitting: Physician Assistant

## 2021-02-16 ENCOUNTER — Observation Stay (HOSPITAL_COMMUNITY)
Admission: RE | Admit: 2021-02-16 | Discharge: 2021-02-18 | Disposition: A | Discharge status: Home / Self Care | Payer: PPO | Source: Ambulatory Visit | Attending: Orthopedic Surgery | Admitting: Orthopedic Surgery

## 2021-02-16 ENCOUNTER — Encounter (HOSPITAL_COMMUNITY)
Admission: RE | Disposition: A | Discharge status: Home / Self Care | Payer: Self-pay | Source: Ambulatory Visit | Attending: Orthopedic Surgery

## 2021-02-16 ENCOUNTER — Ambulatory Visit (HOSPITAL_COMMUNITY): Payer: PPO | Admitting: Anesthesiology

## 2021-02-16 ENCOUNTER — Encounter (HOSPITAL_COMMUNITY): Payer: Self-pay | Admitting: Orthopedic Surgery

## 2021-02-16 DIAGNOSIS — M179 Osteoarthritis of knee, unspecified: Secondary | ICD-10-CM | POA: Diagnosis present

## 2021-02-16 DIAGNOSIS — I2510 Atherosclerotic heart disease of native coronary artery without angina pectoris: Secondary | ICD-10-CM | POA: Insufficient documentation

## 2021-02-16 DIAGNOSIS — Z79899 Other long term (current) drug therapy: Secondary | ICD-10-CM | POA: Insufficient documentation

## 2021-02-16 DIAGNOSIS — Z87891 Personal history of nicotine dependence: Secondary | ICD-10-CM | POA: Insufficient documentation

## 2021-02-16 DIAGNOSIS — E039 Hypothyroidism, unspecified: Secondary | ICD-10-CM | POA: Diagnosis not present

## 2021-02-16 DIAGNOSIS — I10 Essential (primary) hypertension: Secondary | ICD-10-CM | POA: Diagnosis not present

## 2021-02-16 DIAGNOSIS — M1712 Unilateral primary osteoarthritis, left knee: Principal | ICD-10-CM | POA: Diagnosis present

## 2021-02-16 DIAGNOSIS — G8918 Other acute postprocedural pain: Secondary | ICD-10-CM | POA: Diagnosis not present

## 2021-02-16 DIAGNOSIS — Z01818 Encounter for other preprocedural examination: Secondary | ICD-10-CM

## 2021-02-16 HISTORY — PX: TOTAL KNEE ARTHROPLASTY: SHX125

## 2021-02-16 LAB — PROTIME-INR
INR: 0.9 (ref 0.8–1.2)
Prothrombin Time: 12.4 seconds (ref 11.4–15.2)

## 2021-02-16 SURGERY — ARTHROPLASTY, KNEE, TOTAL
Anesthesia: Spinal | Site: Knee | Laterality: Left

## 2021-02-16 MED ORDER — ASPIRIN EC 325 MG PO TBEC
325.0000 mg | DELAYED_RELEASE_TABLET | Freq: Two times a day (BID) | ORAL | Status: DC
  Administered 2021-02-17 – 2021-02-18 (×3): 325 mg via ORAL
  Filled 2021-02-16 (×3): qty 1

## 2021-02-16 MED ORDER — CEFAZOLIN SODIUM-DEXTROSE 2-4 GM/100ML-% IV SOLN
2.0000 g | Freq: Four times a day (QID) | INTRAVENOUS | Status: AC
  Administered 2021-02-16 – 2021-02-17 (×2): 2 g via INTRAVENOUS
  Filled 2021-02-16 (×2): qty 100

## 2021-02-16 MED ORDER — ACETAMINOPHEN 10 MG/ML IV SOLN
1000.0000 mg | Freq: Four times a day (QID) | INTRAVENOUS | Status: DC
  Administered 2021-02-16: 1000 mg via INTRAVENOUS
  Filled 2021-02-16: qty 100

## 2021-02-16 MED ORDER — CHLORHEXIDINE GLUCONATE 0.12 % MT SOLN
15.0000 mL | Freq: Once | OROMUCOSAL | Status: AC
  Administered 2021-02-16: 15 mL via OROMUCOSAL

## 2021-02-16 MED ORDER — MENTHOL 3 MG MT LOZG: 1.0000 | LOZENGE | OROMUCOSAL | Status: DC | PRN

## 2021-02-16 MED ORDER — LACTATED RINGERS IV SOLN: INTRAVENOUS | Status: DC

## 2021-02-16 MED ORDER — POVIDONE-IODINE 10 % EX SWAB
2.0000 "application " | Freq: Once | CUTANEOUS | Status: AC
  Administered 2021-02-16: 2 via TOPICAL

## 2021-02-16 MED ORDER — CEFAZOLIN SODIUM-DEXTROSE 2-4 GM/100ML-% IV SOLN
2.0000 g | INTRAVENOUS | Status: AC
  Administered 2021-02-16: 2 g via INTRAVENOUS
  Filled 2021-02-16: qty 100

## 2021-02-16 MED ORDER — LOSARTAN POTASSIUM 50 MG PO TABS
50.0000 mg | ORAL_TABLET | Freq: Every day | ORAL | Status: DC
  Administered 2021-02-17 – 2021-02-18 (×2): 50 mg via ORAL
  Filled 2021-02-16 (×2): qty 1

## 2021-02-16 MED ORDER — CLONIDINE HCL (ANALGESIA) 100 MCG/ML EP SOLN
EPIDURAL | Status: DC | PRN
  Administered 2021-02-16: 50 ug

## 2021-02-16 MED ORDER — TRANEXAMIC ACID-NACL 1000-0.7 MG/100ML-% IV SOLN
1000.0000 mg | INTRAVENOUS | Status: AC
  Administered 2021-02-16: 1000 mg via INTRAVENOUS
  Filled 2021-02-16: qty 100

## 2021-02-16 MED ORDER — DULOXETINE HCL 60 MG PO CPEP
60.0000 mg | ORAL_CAPSULE | Freq: Two times a day (BID) | ORAL | Status: DC
  Administered 2021-02-16 – 2021-02-18 (×4): 60 mg via ORAL
  Filled 2021-02-16 (×4): qty 1

## 2021-02-16 MED ORDER — PROPOFOL 10 MG/ML IV BOLUS
INTRAVENOUS | Status: DC | PRN
Start: 2021-02-16 — End: 2021-02-16
  Administered 2021-02-16: 40 mg via INTRAVENOUS

## 2021-02-16 MED ORDER — PHENYLEPHRINE HCL (PRESSORS) 10 MG/ML IV SOLN
INTRAVENOUS | Status: AC
  Filled 2021-02-16: qty 1

## 2021-02-16 MED ORDER — DEXAMETHASONE SODIUM PHOSPHATE 10 MG/ML IJ SOLN
8.0000 mg | Freq: Once | INTRAMUSCULAR | Status: AC
  Administered 2021-02-16: 8 mg via INTRAVENOUS

## 2021-02-16 MED ORDER — DARIFENACIN HYDROBROMIDE ER 15 MG PO TB24
15.0000 mg | ORAL_TABLET | Freq: Every day | ORAL | Status: DC
  Administered 2021-02-17 – 2021-02-18 (×2): 15 mg via ORAL
  Filled 2021-02-16 (×3): qty 1

## 2021-02-16 MED ORDER — PHENOL 1.4 % MT LIQD: 1.0000 | OROMUCOSAL | Status: DC | PRN

## 2021-02-16 MED ORDER — ATORVASTATIN CALCIUM 80 MG PO TABS
80.0000 mg | ORAL_TABLET | Freq: Every day | ORAL | Status: DC
  Administered 2021-02-17 – 2021-02-18 (×2): 80 mg via ORAL
  Filled 2021-02-16 (×2): qty 1

## 2021-02-16 MED ORDER — PANTOPRAZOLE SODIUM 40 MG PO TBEC
40.0000 mg | DELAYED_RELEASE_TABLET | Freq: Every day | ORAL | Status: DC
  Administered 2021-02-17 – 2021-02-18 (×2): 40 mg via ORAL
  Filled 2021-02-16 (×2): qty 1

## 2021-02-16 MED ORDER — BUPIVACAINE IN DEXTROSE 0.75-8.25 % IT SOLN
INTRATHECAL | Status: DC | PRN
  Administered 2021-02-16: 1.6 mL via INTRATHECAL

## 2021-02-16 MED ORDER — ORAL CARE MOUTH RINSE: 15.0000 mL | Freq: Once | OROMUCOSAL | Status: AC

## 2021-02-16 MED ORDER — PHENYLEPHRINE HCL-NACL 20-0.9 MG/250ML-% IV SOLN
INTRAVENOUS | Status: DC | PRN
  Administered 2021-02-16: 20 ug/min via INTRAVENOUS

## 2021-02-16 MED ORDER — DOCUSATE SODIUM 100 MG PO CAPS
100.0000 mg | ORAL_CAPSULE | Freq: Two times a day (BID) | ORAL | Status: DC
  Administered 2021-02-16 – 2021-02-18 (×4): 100 mg via ORAL
  Filled 2021-02-16 (×4): qty 1

## 2021-02-16 MED ORDER — ONDANSETRON HCL 4 MG/2ML IJ SOLN: 4.0000 mg | Freq: Four times a day (QID) | INTRAMUSCULAR | Status: DC | PRN

## 2021-02-16 MED ORDER — ACETAMINOPHEN 500 MG PO TABS
1000.0000 mg | ORAL_TABLET | Freq: Four times a day (QID) | ORAL | Status: AC
  Administered 2021-02-16 – 2021-02-17 (×4): 1000 mg via ORAL
  Filled 2021-02-16 (×4): qty 2

## 2021-02-16 MED ORDER — SODIUM CHLORIDE (PF) 0.9 % IJ SOLN
INTRAMUSCULAR | Status: DC | PRN
  Administered 2021-02-16: 60 mL

## 2021-02-16 MED ORDER — MORPHINE SULFATE (PF) 2 MG/ML IV SOLN
0.5000 mg | INTRAVENOUS | Status: DC | PRN
  Administered 2021-02-17 (×2): 1 mg via INTRAVENOUS
  Filled 2021-02-16 (×2): qty 1

## 2021-02-16 MED ORDER — SODIUM CHLORIDE 0.9 % IR SOLN
Status: DC | PRN
  Administered 2021-02-16: 1000 mL

## 2021-02-16 MED ORDER — OXYCODONE HCL 5 MG PO TABS: 5.0000 mg | ORAL_TABLET | Freq: Once | ORAL | Status: DC | PRN

## 2021-02-16 MED ORDER — FLEET ENEMA 7-19 GM/118ML RE ENEM: 1.0000 | ENEMA | Freq: Once | RECTAL | Status: DC | PRN

## 2021-02-16 MED ORDER — LEVOTHYROXINE SODIUM 75 MCG PO TABS
150.0000 ug | ORAL_TABLET | Freq: Every day | ORAL | Status: DC
  Administered 2021-02-17 – 2021-02-18 (×2): 150 ug via ORAL
  Filled 2021-02-16: qty 2
  Filled 2021-02-16: qty 1
  Filled 2021-02-16: qty 2

## 2021-02-16 MED ORDER — SODIUM CHLORIDE (PF) 0.9 % IJ SOLN
INTRAMUSCULAR | Status: AC
  Filled 2021-02-16: qty 10

## 2021-02-16 MED ORDER — POLYETHYLENE GLYCOL 3350 17 G PO PACK: 17.0000 g | PACK | Freq: Every day | ORAL | Status: DC | PRN

## 2021-02-16 MED ORDER — BISACODYL 10 MG RE SUPP: 10.0000 mg | Freq: Every day | RECTAL | Status: DC | PRN

## 2021-02-16 MED ORDER — BUPIVACAINE LIPOSOME 1.3 % IJ SUSP
INTRAMUSCULAR | Status: AC
  Filled 2021-02-16: qty 20

## 2021-02-16 MED ORDER — METOCLOPRAMIDE HCL 5 MG/ML IJ SOLN: 5.0000 mg | Freq: Three times a day (TID) | INTRAMUSCULAR | Status: DC | PRN

## 2021-02-16 MED ORDER — FENTANYL CITRATE PF 50 MCG/ML IJ SOSY
50.0000 ug | PREFILLED_SYRINGE | INTRAMUSCULAR | Status: AC
  Administered 2021-02-16: 50 ug via INTRAVENOUS
  Filled 2021-02-16: qty 2

## 2021-02-16 MED ORDER — OLANZAPINE-SAMIDORPHAN 5-10 MG PO TABS
1.0000 | ORAL_TABLET | Freq: Every day | ORAL | Status: DC
  Administered 2021-02-17: 22:00:00 1 via ORAL

## 2021-02-16 MED ORDER — FENTANYL CITRATE PF 50 MCG/ML IJ SOSY: 25.0000 ug | PREFILLED_SYRINGE | INTRAMUSCULAR | Status: DC | PRN

## 2021-02-16 MED ORDER — ONDANSETRON HCL 4 MG PO TABS
4.0000 mg | ORAL_TABLET | Freq: Four times a day (QID) | ORAL | Status: DC | PRN
  Filled 2021-02-16: qty 1

## 2021-02-16 MED ORDER — ALBUTEROL SULFATE (2.5 MG/3ML) 0.083% IN NEBU: 3.0000 mL | INHALATION_SOLUTION | Freq: Four times a day (QID) | RESPIRATORY_TRACT | Status: DC | PRN

## 2021-02-16 MED ORDER — BUPROPION HCL ER (XL) 300 MG PO TB24
300.0000 mg | ORAL_TABLET | Freq: Every day | ORAL | Status: DC
  Administered 2021-02-17 – 2021-02-18 (×2): 300 mg via ORAL
  Filled 2021-02-16 (×2): qty 1

## 2021-02-16 MED ORDER — OXYCODONE HCL 5 MG PO TABS
10.0000 mg | ORAL_TABLET | ORAL | Status: DC | PRN
  Administered 2021-02-17 (×2): 10 mg via ORAL
  Filled 2021-02-16 (×2): qty 2

## 2021-02-16 MED ORDER — OXYCODONE HCL 5 MG PO TABS
5.0000 mg | ORAL_TABLET | ORAL | Status: DC | PRN
  Administered 2021-02-16 – 2021-02-18 (×3): 5 mg via ORAL
  Filled 2021-02-16 (×3): qty 1

## 2021-02-16 MED ORDER — BUPIVACAINE-EPINEPHRINE (PF) 0.5% -1:200000 IJ SOLN
INTRAMUSCULAR | Status: DC | PRN
  Administered 2021-02-16: 15 mL via PERINEURAL

## 2021-02-16 MED ORDER — OLANZAPINE 5 MG PO TABS
5.0000 mg | ORAL_TABLET | Freq: Every day | ORAL | Status: AC
  Administered 2021-02-16: 5 mg via ORAL
  Filled 2021-02-16: qty 1

## 2021-02-16 MED ORDER — BUPIVACAINE LIPOSOME 1.3 % IJ SUSP: 20.0000 mL | Freq: Once | INTRAMUSCULAR | Status: DC

## 2021-02-16 MED ORDER — METHOCARBAMOL 500 MG IVPB - SIMPLE MED
500.0000 mg | Freq: Four times a day (QID) | INTRAVENOUS | Status: DC | PRN
  Filled 2021-02-16: qty 50

## 2021-02-16 MED ORDER — METHOCARBAMOL 500 MG PO TABS
500.0000 mg | ORAL_TABLET | Freq: Four times a day (QID) | ORAL | Status: DC | PRN
  Administered 2021-02-16 – 2021-02-17 (×3): 500 mg via ORAL
  Filled 2021-02-16 (×3): qty 1

## 2021-02-16 MED ORDER — DILTIAZEM HCL ER COATED BEADS 240 MG PO CP24
240.0000 mg | ORAL_CAPSULE | Freq: Every day | ORAL | Status: DC
  Administered 2021-02-17 – 2021-02-18 (×2): 240 mg via ORAL
  Filled 2021-02-16 (×2): qty 1

## 2021-02-16 MED ORDER — PROPOFOL 500 MG/50ML IV EMUL
INTRAVENOUS | Status: DC | PRN
  Administered 2021-02-16: 75 ug/kg/min via INTRAVENOUS

## 2021-02-16 MED ORDER — OXYCODONE HCL 5 MG/5ML PO SOLN: 5.0000 mg | Freq: Once | ORAL | Status: DC | PRN

## 2021-02-16 MED ORDER — METOCLOPRAMIDE HCL 5 MG PO TABS: 5.0000 mg | ORAL_TABLET | Freq: Three times a day (TID) | ORAL | Status: DC | PRN

## 2021-02-16 MED ORDER — DEXAMETHASONE SODIUM PHOSPHATE 10 MG/ML IJ SOLN
INTRAMUSCULAR | Status: AC
  Filled 2021-02-16: qty 1

## 2021-02-16 MED ORDER — PANTOPRAZOLE SODIUM 40 MG PO TBEC: 40.0000 mg | DELAYED_RELEASE_TABLET | Freq: Every day | ORAL | Status: DC

## 2021-02-16 MED ORDER — INFLUENZA VAC A&B SA ADJ QUAD 0.5 ML IM PRSY
0.5000 mL | PREFILLED_SYRINGE | INTRAMUSCULAR | Status: DC
  Filled 2021-02-16: qty 0.5

## 2021-02-16 MED ORDER — BUPIVACAINE LIPOSOME 1.3 % IJ SUSP
INTRAMUSCULAR | Status: DC | PRN
  Administered 2021-02-16: 20 mL

## 2021-02-16 MED ORDER — SODIUM CHLORIDE 0.9 % IV SOLN: INTRAVENOUS | Status: DC

## 2021-02-16 MED ORDER — DEXAMETHASONE SODIUM PHOSPHATE 10 MG/ML IJ SOLN
10.0000 mg | Freq: Once | INTRAMUSCULAR | Status: AC
  Administered 2021-02-17: 10:00:00 10 mg via INTRAVENOUS
  Filled 2021-02-16: qty 1

## 2021-02-16 MED ORDER — DIPHENHYDRAMINE HCL 12.5 MG/5ML PO ELIX: 12.5000 mg | ORAL_SOLUTION | ORAL | Status: DC | PRN

## 2021-02-16 SURGICAL SUPPLY — 62 items
ATTUNE PSFEM LTSZ5 NARCEM KNEE (Femur) ×2 IMPLANT
ATTUNE PSRP INSR SZ5 8 KNEE (Insert) ×1 IMPLANT
ATTUNE PSRP INSR SZ5 8MM KNEE (Insert) ×1 IMPLANT
BAG COUNTER SPONGE SURGICOUNT (BAG) IMPLANT
BAG SPEC THK2 15X12 ZIP CLS (MISCELLANEOUS) ×1
BAG SPNG CNTER NS LX DISP (BAG)
BAG SURGICOUNT SPONGE COUNTING (BAG)
BAG ZIPLOCK 12X15 (MISCELLANEOUS) ×3 IMPLANT
BASE TIBIAL ROT PLAT SZ 5 KNEE (Knees) IMPLANT
BLADE SAG 18X100X1.27 (BLADE) ×3 IMPLANT
BLADE SAW SGTL 11.0X1.19X90.0M (BLADE) ×3 IMPLANT
BNDG CMPR MED 10X6 ELC LF (GAUZE/BANDAGES/DRESSINGS) ×1
BNDG ELASTIC 6X10 VLCR STRL LF (GAUZE/BANDAGES/DRESSINGS) ×2 IMPLANT
BNDG ELASTIC 6X5.8 VLCR STR LF (GAUZE/BANDAGES/DRESSINGS) ×3 IMPLANT
BOWL SMART MIX CTS (DISPOSABLE) ×3 IMPLANT
BSPLAT TIB 5 CMNT ROT PLAT STR (Knees) ×1 IMPLANT
CEMENT HV SMART SET (Cement) ×6 IMPLANT
CLOSURE WOUND 1/2 X4 (GAUZE/BANDAGES/DRESSINGS) ×2
COVER SURGICAL LIGHT HANDLE (MISCELLANEOUS) ×3 IMPLANT
CUFF TOURN SGL QUICK 34 (TOURNIQUET CUFF) ×3
CUFF TRNQT CYL 34X4.125X (TOURNIQUET CUFF) ×1 IMPLANT
DECANTER SPIKE VIAL GLASS SM (MISCELLANEOUS) ×3 IMPLANT
DRAPE INCISE IOBAN 66X45 STRL (DRAPES) ×3 IMPLANT
DRAPE U-SHAPE 47X51 STRL (DRAPES) ×3 IMPLANT
DRESSING AQUACEL AG SP 3.5X10 (GAUZE/BANDAGES/DRESSINGS) IMPLANT
DRSG AQUACEL AG ADV 3.5X10 (GAUZE/BANDAGES/DRESSINGS) ×3 IMPLANT
DRSG AQUACEL AG SP 3.5X10 (GAUZE/BANDAGES/DRESSINGS) ×3
DURAPREP 26ML APPLICATOR (WOUND CARE) ×3 IMPLANT
ELECT REM PT RETURN 15FT ADLT (MISCELLANEOUS) ×3 IMPLANT
GLOVE SRG 8 PF TXTR STRL LF DI (GLOVE) ×1 IMPLANT
GLOVE SURG ENC MOIS LTX SZ6.5 (GLOVE) ×3 IMPLANT
GLOVE SURG ENC MOIS LTX SZ8 (GLOVE) ×6 IMPLANT
GLOVE SURG UNDER POLY LF SZ7 (GLOVE) ×3 IMPLANT
GLOVE SURG UNDER POLY LF SZ8 (GLOVE) ×3
GLOVE SURG UNDER POLY LF SZ8.5 (GLOVE) ×3 IMPLANT
GOWN STRL REUS W/TWL LRG LVL3 (GOWN DISPOSABLE) ×6 IMPLANT
GOWN STRL REUS W/TWL XL LVL3 (GOWN DISPOSABLE) ×3 IMPLANT
HANDPIECE INTERPULSE COAX TIP (DISPOSABLE) ×3
HOLDER FOLEY CATH W/STRAP (MISCELLANEOUS) IMPLANT
IMMOBILIZER KNEE 20 (SOFTGOODS) ×3
IMMOBILIZER KNEE 20 THIGH 36 (SOFTGOODS) ×1 IMPLANT
KIT TURNOVER KIT A (KITS) IMPLANT
MANIFOLD NEPTUNE II (INSTRUMENTS) ×3 IMPLANT
NS IRRIG 1000ML POUR BTL (IV SOLUTION) ×3 IMPLANT
PACK TOTAL KNEE CUSTOM (KITS) ×3 IMPLANT
PADDING CAST COTTON 6X4 STRL (CAST SUPPLIES) ×4 IMPLANT
PATELLA MEDIAL ATTUN 35MM KNEE (Knees) ×2 IMPLANT
PROTECTOR NERVE ULNAR (MISCELLANEOUS) ×3 IMPLANT
SET HNDPC FAN SPRY TIP SCT (DISPOSABLE) ×1 IMPLANT
SPONGE T-LAP 18X18 ~~LOC~~+RFID (SPONGE) ×9 IMPLANT
STRIP CLOSURE SKIN 1/2X4 (GAUZE/BANDAGES/DRESSINGS) ×4 IMPLANT
SUT MNCRL AB 4-0 PS2 18 (SUTURE) ×3 IMPLANT
SUT STRATAFIX 0 PDS 27 VIOLET (SUTURE) ×3
SUT VIC AB 2-0 CT1 27 (SUTURE) ×9
SUT VIC AB 2-0 CT1 TAPERPNT 27 (SUTURE) ×3 IMPLANT
SUTURE STRATFX 0 PDS 27 VIOLET (SUTURE) ×1 IMPLANT
TAPE STRIPS DRAPE STRL (GAUZE/BANDAGES/DRESSINGS) ×2 IMPLANT
TIBIAL BASE ROT PLAT SZ 5 KNEE (Knees) ×3 IMPLANT
TRAY FOLEY MTR SLVR 16FR STAT (SET/KITS/TRAYS/PACK) ×3 IMPLANT
TUBE SUCTION HIGH CAP CLEAR NV (SUCTIONS) ×3 IMPLANT
WATER STERILE IRR 1000ML POUR (IV SOLUTION) ×6 IMPLANT
WRAP KNEE MAXI GEL POST OP (GAUZE/BANDAGES/DRESSINGS) ×3 IMPLANT

## 2021-02-16 NOTE — Progress Notes (Signed)
Assisted Dr. Howse with left, ultrasound guided, adductor canal block. Side rails up, monitors on throughout procedure. See vital signs in flow sheet. Tolerated Procedure well.  

## 2021-02-16 NOTE — Care Plan (Signed)
Ortho Bundle Case Management Note  Patient Details  Name: [name] MRN: [id number] Date of Birth: 08/02/1945                  L TKA on 02-16-21 DCP: Home with dtr DME: RW ordered through [place] PT: [place] on 02-20-21 at 11:00 am.   DME Arranged:  [place]ford Rile rolling DME Agency:  [facility]  HH Arranged:    [place] Agency:     Additional Comments: Please contact me with any questions of if this plan should need to change.  Marianne Sofia, RN,CCM [facility]  [phone] 02/16/2021, 12:35 PM

## 2021-02-16 NOTE — Transfer of Care (Signed)
Immediate Anesthesia Transfer of Care Note  Patient: [name]  Procedure(s) Performed: TOTAL KNEE ARTHROPLASTY (Left: Knee)  Patient Location: PACU  Anesthesia Type:Spinal  Level of Consciousness: awake, alert  and oriented  Airway & Oxygen Therapy: Patient Spontanous Breathing and Patient connected to face mask oxygen  Post-op Assessment: Report given to RN and Post -op Vital signs reviewed and stable  Post vital signs: Reviewed and stable  Last Vitals:  Vitals Value Taken Time  BP 90/46 02/16/21 1303  Temp    Pulse 60 02/16/21 1305  Resp 18 02/16/21 1305  SpO2 94 % 02/16/21 1305  Vitals shown include unvalidated device data.  Last Pain:  Vitals:   02/16/21 1114  TempSrc:   PainSc: 0-No pain      Patients Stated Pain Goal: 4 (94/47/39 [id number])  Complications: No notable events documented.

## 2021-02-16 NOTE — Progress Notes (Signed)
Orthopedic Tech Progress Note Patient Details:  [name] 06/13/1945 [id number]  CPM Left Knee CPM Left Knee: On Left Knee Flexion (Degrees): 40 Left Knee Extension (Degrees): 10  Post Interventions Patient Tolerated: Well Instructions Provided: Care of device  [place] Pink 02/16/2021, 1:10 PM

## 2021-02-16 NOTE — Discharge Instructions (Signed)
 Carrie Aluisio, MD Total Joint Specialist [facility] [address] [place], [place] [address] [phone]  TOTAL KNEE REPLACEMENT POSTOPERATIVE DIRECTIONS    Knee Rehabilitation, Guidelines Following Surgery  Results after knee surgery are often greatly improved when you follow the exercise, range of motion and muscle strengthening exercises prescribed by your doctor. Safety measures are also important to protect the knee from further injury. If any of these exercises cause you to have increased pain or swelling in your knee joint, decrease the amount until you are comfortable again and slowly increase them. If you have problems or questions, call your caregiver or physical therapist for advice.   BLOOD CLOT PREVENTION Take a 325 mg Aspirin two times a day for three weeks following surgery. Then take an 81 mg Aspirin once a day for three weeks. Then discontinue Aspirin. You may resume your vitamins/supplements upon discharge from the hospital. Do not take any NSAIDs (Advil, Aleve, Ibuprofen, Meloxicam, etc.) until you have discontinued the 325 mg Aspirin.  HOME CARE INSTRUCTIONS  Remove items at home which could result in a fall. This includes throw rugs or furniture in walking pathways.  ICE to the affected knee as much as tolerated. Icing helps control swelling. If the swelling is well controlled you will be more comfortable and rehab easier. Continue to use ice on the knee for pain and swelling from surgery. You may notice swelling that will progress down to the foot and ankle. This is normal after surgery. Elevate the leg when you are not up walking on it.    Continue to use the breathing machine which will help keep your temperature down. It is common for your temperature to cycle up and down following surgery, especially at night when you are not up moving around and exerting yourself. The breathing machine keeps your lungs expanded and your temperature  down. Do not place pillow under the operative knee, focus on keeping the knee straight while resting  DIET You may resume your previous home diet once you are discharged from the hospital.  DRESSING / WOUND CARE / SHOWERING Keep your bulky bandage on for 2 days. On the third post-operative day you may remove the Ace bandage and gauze. There is a waterproof adhesive bandage on your skin which will stay in place until your first follow-up appointment. Once you remove this you will not need to place another bandage You may begin showering 3 days following surgery, but do not submerge the incision under water.  ACTIVITY For the first 5 days, the key is rest and control of pain and swelling Do your home exercises twice a day starting on post-operative day 3. On the days you go to physical therapy, just do the home exercises once that day. You should rest, ice and elevate the leg for 50 minutes out of every hour. Get up and walk/stretch for 10 minutes per hour. After 5 days you can increase your activity slowly as tolerated. Walk with your walker as instructed. Use the walker until you are comfortable transitioning to a cane. Walk with the cane in the opposite hand of the operative leg. You may discontinue the cane once you are comfortable and walking steadily. Avoid periods of inactivity such as sitting longer than an hour when not asleep. This helps prevent blood clots.  You may discontinue the knee immobilizer once you are able to perform a straight leg raise while lying down. You may resume a sexual relationship in one month   or when given the OK by your doctor.  You may return to work once you are cleared by your doctor.  Do not drive a car for 6 weeks or until released by your surgeon.  Do not drive while taking narcotics.  TED HOSE STOCKINGS Wear the elastic stockings on both legs for three weeks following surgery during the day. You may remove them at night for sleeping.  WEIGHT  BEARING Weight bearing as tolerated with assist device (walker, cane, etc) as directed, use it as long as suggested by your surgeon or therapist, typically at least 4-6 weeks.  POSTOPERATIVE CONSTIPATION PROTOCOL Constipation - defined medically as fewer than three stools per week and severe constipation as less than one stool per week.  One of the most common issues patients have following surgery is constipation.  Even if you have a regular bowel pattern at home, your normal regimen is likely to be disrupted due to multiple reasons following surgery.  Combination of anesthesia, postoperative narcotics, change in appetite and fluid intake all can affect your bowels.  In order to avoid complications following surgery, here are some recommendations in order to help you during your recovery period.  Colace (docusate) - Pick up an over-the-counter form of Colace or another stool softener and take twice a day as long as you are requiring postoperative pain medications.  Take with a full glass of water daily.  If you experience loose stools or diarrhea, hold the colace until you stool forms back up. If your symptoms do not get better within 1 week or if they get worse, check with your doctor. Dulcolax (bisacodyl) - Pick up over-the-counter and take as directed by the product packaging as needed to assist with the movement of your bowels.  Take with a full glass of water.  Use this product as needed if not relieved by Colace only.  MiraLax (polyethylene glycol) - Pick up over-the-counter to have on hand. MiraLax is a solution that will increase the amount of water in your bowels to assist with bowel movements.  Take as directed and can mix with a glass of water, juice, soda, coffee, or tea. Take if you go more than two days without a movement. Do not use MiraLax more than once per day. Call your doctor if you are still constipated or irregular after using this medication for 7 days in a row.  If you continue  to have problems with postoperative constipation, please contact the office for further assistance and recommendations.  If you experience "the worst abdominal pain ever" or develop nausea or vomiting, please contact the office immediatly for further recommendations for treatment.  ITCHING If you experience itching with your medications, try taking only a single pain pill, or even half a pain pill at a time.  You can also use Benadryl over the counter for itching or also to help with sleep.   MEDICATIONS See your medication summary on the "After Visit Summary" that the nursing staff will review with you prior to discharge.  You may have some home medications which will be placed on hold until you complete the course of blood thinner medication.  It is important for you to complete the blood thinner medication as prescribed by your surgeon.  Continue your approved medications as instructed at time of discharge.  PRECAUTIONS If you experience chest pain or shortness of breath - call 911 immediately for transfer to the hospital emergency department.  If you develop a fever greater that 101 F,   purulent drainage from wound, increased redness or drainage from wound, foul odor from the wound/dressing, or calf pain - CONTACT YOUR SURGEON.                                                   FOLLOW-UP APPOINTMENTS Make sure you keep all of your appointments after your operation with your surgeon and caregivers. You should call the office at the above phone number and make an appointment for approximately two weeks after the date of your surgery or on the date instructed by your surgeon outlined in the "After Visit Summary".  RANGE OF MOTION AND STRENGTHENING EXERCISES  Rehabilitation of the knee is important following a knee injury or an operation. After just a few days of immobilization, the muscles of the thigh which control the knee become weakened and shrink (atrophy). Knee exercises are designed to build up  the tone and strength of the thigh muscles and to improve knee motion. Often times heat used for twenty to thirty minutes before working out will loosen up your tissues and help with improving the range of motion but do not use heat for the first two weeks following surgery. These exercises can be done on a training (exercise) mat, on the floor, on a table or on a bed. Use what ever works the best and is most comfortable for you Knee exercises include:  Leg Lifts - While your knee is still immobilized in a splint or cast, you can do straight leg raises. Lift the leg to 60 degrees, hold for 3 sec, and slowly lower the leg. Repeat 10-20 times 2-3 times daily. Perform this exercise against resistance later as your knee gets better.  Quad and Hamstring Sets - Tighten up the muscle on the front of the thigh (Quad) and hold for 5-10 sec. Repeat this 10-20 times hourly. Hamstring sets are done by pushing the foot backward against an object and holding for 5-10 sec. Repeat as with quad sets.  Leg Slides: Lying on your back, slowly slide your foot toward your buttocks, bending your knee up off the floor (only go as far as is comfortable). Then slowly slide your foot back down until your leg is flat on the floor again. Angel Wings: Lying on your back spread your legs to the side as far apart as you can without causing discomfort.  A rehabilitation program following serious knee injuries can speed recovery and prevent re-injury in the future due to weakened muscles. Contact your doctor or a physical therapist for more information on knee rehabilitation.   POST-OPERATIVE OPIOID TAPER INSTRUCTIONS: It is important to wean off of your opioid medication as soon as possible. If you do not need pain medication after your surgery it is ok to stop day one. Opioids include: Codeine, Hydrocodone(Norco, Vicodin), Oxycodone(Percocet, oxycontin) and hydromorphone amongst others.  Long term and even short term use of opiods can  cause: Increased pain response Dependence Constipation Depression Respiratory depression And more.  Withdrawal symptoms can include Flu like symptoms Nausea, vomiting And more Techniques to manage these symptoms Hydrate well Eat regular healthy meals Stay active Use relaxation techniques(deep breathing, meditating, yoga) Do Not substitute Alcohol to help with tapering If you have been on opioids for less than two weeks and do not have pain than it is ok to stop all together.  Plan   to wean off of opioids This plan should start within one week post op of your joint replacement. Maintain the same interval or time between taking each dose and first decrease the dose.  Cut the total daily intake of opioids by one tablet each day Next start to increase the time between doses. The last dose that should be eliminated is the evening dose.   IF YOU ARE TRANSFERRED TO A SKILLED REHAB FACILITY If the patient is transferred to a skilled rehab facility following release from the hospital, a list of the current medications will be sent to the facility for the patient to continue.  When discharged from the skilled rehab facility, please have the facility set up the patient's Home Health Physical Therapy prior to being released. Also, the skilled facility will be responsible for providing the patient with their medications at time of release from the facility to include their pain medication, the muscle relaxants, and their blood thinner medication. If the patient is still at the rehab facility at time of the two week follow up appointment, the skilled rehab facility will also need to assist the patient in arranging follow up appointment in our office and any transportation needs.  MAKE SURE YOU:  Understand these instructions.  Get help right away if you are not doing well or get worse.   DENTAL ANTIBIOTICS:  In most cases prophylactic antibiotics for Dental procdeures after total joint surgery are  not necessary.  Exceptions are as follows:  1. History of prior total joint infection  2. Severely immunocompromised (Organ Transplant, cancer chemotherapy, Rheumatoid biologic meds such as Humera)  3. Poorly controlled diabetes (A1C &gt; 8.0, blood glucose over 200)  If you have one of these conditions, contact your surgeon for an antibiotic prescription, prior to your dental procedure.    Pick up stool softner and laxative for home use following surgery while on pain medications. Do not submerge incision under water. Please use good hand washing techniques while changing dressing each day. May shower starting three days after surgery. Please use a clean towel to pat the incision dry following showers. Continue to use ice for pain and swelling after surgery. Do not use any lotions or creams on the incision until instructed by your surgeon.  

## 2021-02-16 NOTE — Op Note (Signed)
OPERATIVE REPORT-TOTAL KNEE ARTHROPLASTY   Pre-operative diagnosis- Osteoarthritis  Left knee(s)  Post-operative diagnosis- Osteoarthritis Left knee(s)  Procedure-  Left  Total Knee Arthroplasty  Surgeon- Dione Plover. Lameeka Schleifer, MD  Assistant- Molli Barrows, PA-C   Anesthesia-   Adductor canal block and spinal  EBL- 25 ml   Drains None  Tourniquet time-  Total Tourniquet Time Documented: Thigh (Left) - 38 minutes Total: Thigh (Left) - 38 minutes     Complications- None  Condition-PACU - hemodynamically stable.   Brief Clinical Note  [name] is a 76 y.o. year old female with end stage OA of her left knee with progressively worsening pain and dysfunction. She has constant pain, with activity and at rest and significant functional deficits with difficulties even with ADLs. She has had extensive non-op management including analgesics, injections of cortisone and viscosupplements, and home exercise program, but remains in significant pain with significant dysfunction. Radiographs show bone on bone arthritis medial and patellofemoral. She presents now for left Total Knee Arthroplasty.     Procedure in detail---   The patient is brought into the operating room and positioned supine on the operating table. After successful administration of  Adductor canal block and spinal,   a tourniquet is placed high on the  Left thigh(s) and the lower extremity is prepped and draped in the usual sterile fashion. Time out is performed by the operating team and then the  Left lower extremity is wrapped in Esmarch, knee flexed and the tourniquet inflated to 300 mmHg.       A midline incision is made with a ten blade through the subcutaneous tissue to the level of the extensor mechanism. A fresh blade is used to make a medial parapatellar arthrotomy. Soft tissue over the proximal medial tibia is subperiosteally elevated to the joint line with a knife and into the semimembranosus bursa with a Cobb  elevator. Soft tissue over the proximal lateral tibia is elevated with attention being paid to avoiding the patellar tendon on the tibial tubercle. The patella is everted, knee flexed 90 degrees and the ACL and PCL are removed. Findings are bone on bone medial and patellofemoral with large global osteophytes.        The drill is used to create a starting hole in the distal femur and the canal is thoroughly irrigated with sterile saline to remove the fatty contents. The 5 degree Left  valgus alignment guide is placed into the femoral canal and the distal femoral cutting block is pinned to remove 9 mm off the distal femur. Resection is made with an oscillating saw.      The tibia is subluxed forward and the menisci are removed. The extramedullary alignment guide is placed referencing proximally at the medial aspect of the tibial tubercle and distally along the second metatarsal axis and tibial crest. The block is pinned to remove 47mm off the more deficient medial  side. Resection is made with an oscillating saw. Size 5is the most appropriate size for the tibia and the proximal tibia is prepared with the modular drill and keel punch for that size.      The femoral sizing guide is placed and size 5 is most appropriate. Rotation is marked off the epicondylar axis and confirmed by creating a rectangular flexion gap at 90 degrees. The size 5 cutting block is pinned in this rotation and the anterior, posterior and chamfer cuts are made with the oscillating saw. The intercondylar block is then placed and that cut  is made.      Trial size 5 tibial component, trial size 5 narrow posterior stabilized femur and a 8  mm posterior stabilized rotating platform insert trial is placed. Full extension is achieved with excellent varus/valgus and anterior/posterior balance throughout full range of motion. The patella is everted and thickness measured to be 22  mm. Free hand resection is taken to 12 mm, a 35 template is placed, lug  holes are drilled, trial patella is placed, and it tracks normally. Osteophytes are removed off the posterior femur with the trial in place. All trials are removed and the cut bone surfaces prepared with pulsatile lavage. Cement is mixed and once ready for implantation, the size 5 tibial implant, size  5 narrow posterior stabilized femoral component, and the size 35 patella are cemented in place and the patella is held with the clamp. The trial insert is placed and the knee held in full extension. The Exparel (20 ml mixed with 60 ml saline) is injected into the extensor mechanism, posterior capsule, medial and lateral gutters and subcutaneous tissues.  All extruded cement is removed and once the cement is hard the permanent 8 mm posterior stabilized rotating platform insert is placed into the tibial tray.      The wound is copiously irrigated with saline solution and the extensor mechanism closed with # 0 Stratofix suture. The tourniquet is released for a total tourniquet time of 38  minutes. Flexion against gravity is 140 degrees and the patella tracks normally. Subcutaneous tissue is closed with 2.0 vicryl and subcuticular with running 4.0 Monocryl. The incision is cleaned and dried and steri-strips and a bulky sterile dressing are applied. The limb is placed into a knee immobilizer and the patient is awakened and transported to recovery in stable condition.      Please note that a surgical assistant was a medical necessity for this procedure in order to perform it in a safe and expeditious manner. Surgical assistant was necessary to retract the ligaments and vital neurovascular structures to prevent injury to them and also necessary for proper positioning of the limb to allow for anatomic placement of the prosthesis.   Dione Plover Anthoni Geerts, MD    02/16/2021, 12:40 PM

## 2021-02-16 NOTE — Anesthesia Postprocedure Evaluation (Signed)
Anesthesia Post Note  Patient: [name]  Procedure(s) Performed: TOTAL KNEE ARTHROPLASTY (Left: Knee)     Patient location during evaluation: PACU Anesthesia Type: Spinal Level of consciousness: awake and alert and oriented Pain management: pain level controlled Vital Signs Assessment: post-procedure vital signs reviewed and stable Respiratory status: spontaneous breathing, nonlabored ventilation and respiratory function stable Cardiovascular status: blood pressure returned to baseline Postop Assessment: no apparent nausea or vomiting, spinal receding, no headache and no backache Anesthetic complications: no   No notable events documented.  Last Vitals:  Vitals:   02/16/21 1400 02/16/21 1415  BP: (!) 114/50 (!) 117/52  Pulse: 60 (!) 57  Resp: 18 17  Temp:  36.7 C  SpO2: 96% 96%    Last Pain:  Vitals:   02/16/21 1415  TempSrc:   PainSc: 0-No pain                 Marthenia Rolling

## 2021-02-16 NOTE — Interval H&P Note (Signed)
History and Physical Interval Note:  02/16/2021 9:28 AM  [name]  has presented today for surgery, with the diagnosis of Left knee osteoarthritis.  The various methods of treatment have been discussed with the patient and family. After consideration of risks, benefits and other options for treatment, the patient has consented to  Procedure(s): TOTAL KNEE ARTHROPLASTY (Left) as a surgical intervention.  The patient's history has been reviewed, patient examined, no change in status, stable for surgery.  I have reviewed the patient's chart and labs.  Questions were answered to the patient's satisfaction.     Pilar Plate Glenda Spelman

## 2021-02-16 NOTE — Plan of Care (Signed)
Problem: Activity: Goal: Range of joint motion will improve Outcome: Progressing   Problem: Clinical Measurements: Goal: Postoperative complications will be avoided or minimized Outcome: Progressing   Problem: Pain Management: Goal: Pain level will decrease with appropriate interventions Outcome: Gates, RN 02/16/21 8:58 PM

## 2021-02-16 NOTE — Evaluation (Signed)
Physical Therapy Evaluation Patient Details Name: [name] MRN: [id number] DOB: September 26, 1945 Today's Date: 02/16/2021  History of Present Illness  Pt s/p LTKA 02/16/2022 ,with PMH of arthitis, bipolar, and GERD. Pt reports she has been having difficulty getting around recently and using a cane. The R knee is just as bad and she has difficulties and pain in it as well.  Clinical Impression  Pt is s/p TKA resulting in the deficits listed below (see PT Problem List).  Pt will benefit from acute PT to increase their independence and safety with mobility to allow discharge to the venue listed below.   Pt will likley be able to DC tomorrow is she continues to progress as she is tonight, she has potential to DC after 1 visit, we will continue to assess.         Recommendations for follow up therapy are one component of a multi-disciplinary discharge planning process, led by the attending physician.  Recommendations may be updated based on patient status, additional functional criteria and insurance authorization.  Follow Up Recommendations Follow physician's recommendations for discharge plan and follow up therapies (OPPT appt 02/20/2021)    Assistance Recommended at Discharge Intermittent Supervision/Assistance  Patient can return home with the following  A little help with bathing/dressing/bathroom;A little help with walking and/or transfers;Assistance with cooking/housework;Assist for transportation;Help with stairs or ramp for entrance    Equipment Recommendations None recommended by PT (dtr states she has one)  Recommendations for Other Services       Functional Status Assessment Patient has had a recent decline in their functional status and demonstrates the ability to make significant improvements in function in a reasonable and predictable amount of time.     Precautions / Restrictions Precautions Precautions: Knee Precaution Comments: Educated pt and family ( son and daughter )  on L knee positioning when resting in knee extension and how to move and bend it perriodically during the day as well Required Braces or Orthoses: Knee Immobilizer - Left Knee Immobilizer - Left: On when out of bed or walking;Discontinue once straight leg raise with < 10 degree lag Restrictions Weight Bearing Restrictions: No      Mobility  Bed Mobility Overal bed mobility: Needs Assistance Bed Mobility: Supine to Sit, Sit to Supine     Supine to sit: Min assist     General bed mobility comments: cues for sequencing and assist for upper body minA    Transfers Overall transfer level: Needs assistance Equipment used: Rolling walker (2 wheels) Transfers: Sit to/from Stand Sit to Stand: Min assist           General transfer comment: a little boost needed to rise to standing at RW and cues for RW safety and hand placement    Ambulation/Gait Ambulation/Gait assistance: Min guard Gait Distance (Feet): 10 Feet (to other side of room and back, this required turning twice which took a lot of energy out of her and she was fatigued after this short distance.) Assistive device: Rolling walker (2 wheels) Gait Pattern/deviations: Step-to pattern          Stairs            Wheelchair Mobility    Modified Rankin (Stroke Patients Only)       Balance Overall balance assessment: Needs assistance Sitting-balance support: Bilateral upper extremity supported, Feet supported Sitting balance-Leahy Scale: Good     Standing balance support: Bilateral upper extremity supported, During functional activity Standing balance-Leahy Scale: Fair  Pertinent Vitals/Pain Pain Assessment Pain Assessment: 0-10 Pain Score: 3  Pain Location: L knee Pain Descriptors / Indicators: Sore Pain Intervention(s): Ice applied, Monitored during session    Home Living Family/patient expects to be discharged to:: Private residence Living Arrangements:  Alone (family has arranged 24/7 for a while until pt is back on her feet independently. Dtr is staying first with pt) Available Help at Discharge: Family Type of Home: House Home Access: Stairs to enter Entrance Stairs-Rails: None Entrance Stairs-Number of Steps: 1+1 ( 1 down first , then walk a little then up another one)   Home Layout: One level Home Equipment: [place] - single [employer] (2 wheels) (pt has RW borrowed from DTr , so will not be needing one per dtr)      Prior Function Prior Level of Function : Independent/Modified Independent             Mobility Comments: used a cane but had difficulty getting around       Hand Dominance        Extremity/Trunk Assessment        Lower Extremity Assessment Lower Extremity Assessment: LLE deficits/detail LLE Deficits / Details: range of motion supine 0-70 degrees, and able to perform SLR 1x before lag on 2nd time, and has good quad set and control supine assessment. Still a little numb in glut area from spinal       Communication   Communication: No difficulties  Cognition Arousal/Alertness: Awake/alert Behavior During Therapy: WFL for tasks assessed/performed Overall Cognitive Status: Within Functional Limits for tasks assessed                                          General Comments      Exercises Total Joint Exercises Ankle Circles/Pumps: AROM, Both, 10 reps, Supine Quad Sets: AROM, Left, 10 reps, Supine Heel Slides: AAROM, 10 reps, Left, Supine Straight Leg Raises: AAROM, Left, 5 reps, Supine Goniometric ROM: 0-70   Assessment/Plan    PT Assessment Patient needs continued PT services  PT Problem List Decreased range of motion;Decreased activity tolerance;Decreased strength;Decreased mobility       PT Treatment Interventions DME instruction;Therapeutic activities;Therapeutic exercise;Gait training;Stair training;Functional mobility training;Patient/family education    PT  Goals (Current goals can be found in the Care Plan section)  Acute Rehab PT Goals Patient Stated Goal: I want to be able to walk better with less pain PT Goal Formulation: With patient Time For Goal Achievement: 03/02/21 Potential to Achieve Goals: Good    Frequency 7X/week     Co-evaluation               AM-PAC PT "6 Clicks" Mobility  Outcome Measure Help needed turning from your back to your side while in a flat bed without using bedrails?: A Little Help needed moving from lying on your back to sitting on the side of a flat bed without using bedrails?: A Little Help needed moving to and from a bed to a chair (including a wheelchair)?: A Little Help needed standing up from a chair using your arms (e.g., wheelchair or bedside chair)?: A Little Help needed to walk in hospital room?: A Little Help needed climbing 3-5 steps with a railing? : A Little 6 Click Score: 18    End of Session Equipment Utilized During Treatment: Gait belt Activity Tolerance: Patient tolerated treatment well Patient left: in chair;with call bell/phone  within reach;with chair alarm set;with family/visitor present Nurse Communication: Mobility status PT Visit Diagnosis: Other abnormalities of gait and mobility (R26.89)    Time: 7001-7494 PT Time Calculation (min) (ACUTE ONLY): 55 min   Charges:   PT Evaluation $PT Eval Low Complexity: 1 Low PT Treatments $Gait Training: 8-22 mins $Therapeutic Activity: 8-22 mins        Thelbert Gartin, PT, MPT Acute Rehabilitation Services Office: [phone] Pager: [phone] 02/16/2021   Clide Dales 02/16/2021, 7:26 PM

## 2021-02-16 NOTE — Anesthesia Procedure Notes (Signed)
Anesthesia Regional Block: Adductor canal block   Pre-Anesthetic Checklist: , timeout performed,  Correct Patient, Correct Site, Correct Laterality,  Correct Procedure, Correct Position, site marked,  Risks and benefits discussed,  Pre-op evaluation,  At surgeon's request and post-op pain management  Laterality: Left  Prep: Maximum Sterile Barrier Precautions used, chloraprep       Needles:  Injection technique: Single-shot  Needle Type: Echogenic Stimulator Needle     Needle Length: 9cm  Needle Gauge: 22     Additional Needles:   Procedures:,,,, ultrasound used (permanent image in chart),,    Narrative:  Start time: 02/16/2021 11:11 AM End time: 02/16/2021 11:14 AM Injection made incrementally with aspirations every 5 mL.  Performed by: Personally  Anesthesiologist: Brennan Bailey, MD  Additional Notes: Risks, benefits, and alternative discussed. Patient gave consent for procedure. Patient prepped and draped in sterile fashion. Sedation administered, patient remains easily responsive to voice. Relevant anatomy identified with ultrasound guidance. Local anesthetic given in 5cc increments with no signs or symptoms of intravascular injection. No pain or paraesthesias with injection. Patient monitored throughout procedure with signs of LAST or immediate complications. Tolerated well. Ultrasound image placed in chart.  Tawny Asal, MD

## 2021-02-16 NOTE — Telephone Encounter (Signed)
Yes I did an AWV on her.

## 2021-02-16 NOTE — Anesthesia Procedure Notes (Signed)
Spinal  Patient location during procedure: OR Start time: 02/16/2021 11:34 AM End time: 02/16/2021 11:36 AM Reason for block: surgical anesthesia Staffing Performed: resident/CRNA  Resident/CRNA: [place] Lexie Koehl C, CRNA Preanesthetic Checklist Completed: patient identified, IV checked, site marked, risks and benefits discussed, surgical consent, monitors and equipment checked, pre-op evaluation and timeout performed Spinal Block Patient position: sitting Prep: DuraPrep and site prepped and draped Patient monitoring: heart rate, cardiac monitor, continuous pulse ox and blood pressure Approach: midline Location: L3-4 Injection technique: single-shot Needle Needle type: Pencan  Needle gauge: 24 G Needle length: 9 cm Assessment Sensory level: T4 Events: CSF return Additional Notes IV functioning, monitors applied to pt. Expiration date of kit checked and confirmed to be in date. Sterile prep and drape, hand hygiene and sterile gloved used. Pt was positioned and spine was prepped in sterile fashion. Skin was anesthetized with lidocaine. Free flow of clear CSF obtained prior to injecting local anesthetic into CSF x 1 attempt. Spinal needle aspirated freely following injection. Needle was carefully withdrawn, and pt tolerated procedure well. Loss of motor and sensory on exam post injection.

## 2021-02-17 ENCOUNTER — Encounter (HOSPITAL_COMMUNITY): Payer: Self-pay | Admitting: Orthopedic Surgery

## 2021-02-17 DIAGNOSIS — M1712 Unilateral primary osteoarthritis, left knee: Secondary | ICD-10-CM | POA: Diagnosis not present

## 2021-02-17 LAB — BASIC METABOLIC PANEL
Anion gap: 9 (ref 5–15)
BUN: 10 mg/dL (ref 8–23)
CO2: 23 mmol/L (ref 22–32)
Calcium: 8.4 mg/dL — ABNORMAL LOW (ref 8.9–10.3)
Chloride: 101 mmol/L (ref 98–111)
Creatinine, Ser: 0.55 mg/dL (ref 0.44–1.00)
GFR, Estimated: >60 mL/min (ref >60)
Glucose, Bld: 249 mg/dL — ABNORMAL HIGH (ref 70–99)
Potassium: 3.8 mmol/L (ref 3.5–5.1)
Sodium: 133 mmol/L — ABNORMAL LOW (ref 135–145)

## 2021-02-17 LAB — CBC
HCT: 34.4 % — ABNORMAL LOW (ref 36.0–46.0)
Hemoglobin: 11.8 g/dL — ABNORMAL LOW (ref 12.0–15.0)
MCH: 32.2 pg (ref 26.0–34.0)
MCHC: 34.3 g/dL (ref 30.0–36.0)
MCV: 94 fL (ref 80.0–100.0)
Platelets: 385 10*3/uL (ref 150–400)
RBC: 3.66 MIL/uL — ABNORMAL LOW (ref 3.87–5.11)
RDW: 12.4 % (ref 11.5–15.5)
WBC: 21.6 10*3/uL — ABNORMAL HIGH (ref 4.0–10.5)
nRBC: 0 % (ref 0.0–0.2)

## 2021-02-17 MED ORDER — OXYCODONE HCL 5 MG PO TABS: 5.0000 mg | ORAL_TABLET | Freq: Four times a day (QID) | ORAL | 0 refills | Status: DC | PRN

## 2021-02-17 MED ORDER — ASPIRIN 325 MG PO TBEC: 325.0000 mg | DELAYED_RELEASE_TABLET | Freq: Two times a day (BID) | ORAL | 0 refills | Status: AC

## 2021-02-17 MED ORDER — METHOCARBAMOL 500 MG PO TABS: 500.0000 mg | ORAL_TABLET | Freq: Four times a day (QID) | ORAL | 0 refills | Status: DC | PRN

## 2021-02-17 NOTE — TOC Transition Note (Signed)
Transition of Care [facility]) - CM/SW Discharge Note   Patient Details  Name: [name] MRN: [id number] Date of Birth: 07-Feb-1945  [facility]) CM/SW Contact:  Lennart Pall, LCSW Phone Number: 02/17/2021, 12:41 PM   Clinical Narrative:    Met with pt today and confirming she already has a rolling walker loaned from family so order cancelled with [employer].  Pt set up for OPPT already at [facility] in [place].  No further TOC needs.   Final next level of care: OP Rehab Barriers to Discharge: No Barriers Identified   Patient Goals and CMS Choice Patient states their goals for this hospitalization and ongoing recovery are:: return home      Discharge Placement                       Discharge Plan and Services                DME Arranged: N/A DME Agency: NA                  Social Determinants of Health (SDOH) Interventions     Readmission Risk Interventions No flowsheet data found.

## 2021-02-17 NOTE — Progress Notes (Signed)
Physical Therapy Treatment Patient Details Name: [name] MRN: [id number] DOB: 12/28/1945 Today's Date: 02/17/2021   History of Present Illness Pt s/p LTKA 02/16/2022 ,with PMH of arthitis, bipolar, and GERD. Pt reports she has been having difficulty getting around recently and using a cane. The R knee is just as bad and she has difficulties and pain in it as well.    PT Comments    Progressing with mobility. Will plan to have a 2nd session to assess pt's readiness to d/c home. Pt and daughter mentioned that pt may decide to remain overnight. RN aware.    Recommendations for follow up therapy are one component of a multi-disciplinary discharge planning process, led by the attending physician.  Recommendations may be updated based on patient status, additional functional criteria and insurance authorization.  Follow Up Recommendations  Follow physician's recommendations for discharge plan and follow up therapies     Assistance Recommended at Discharge Frequent or constant Supervision/Assistance  Patient can return home with the following A little help with walking and/or transfers;A little help with bathing/dressing/bathroom;Assistance with cooking/housework;Help with stairs or ramp for entrance;Assist for transportation   Equipment Recommendations  None recommended by PT    Recommendations for Other Services       Precautions / Restrictions Precautions Precautions: Knee;Fall Restrictions Weight Bearing Restrictions: No Other Position/Activity Restrictions: WBAT     Mobility  Bed Mobility Overal bed mobility: Needs Assistance Bed Mobility: Supine to Sit     Supine to sit: Min assist, HOB elevated     General bed mobility comments: Assist for trunk and L LE. Increased time. Cues provided.    Transfers Overall transfer level: Needs assistance Equipment used: Rolling walker (2 wheels) Transfers: Sit to/from Stand Sit to Stand: Min assist           General  transfer comment: Assist to rise, steady, control descent. Cues for safety, technique, hand/LE placement. Increased time.    Ambulation/Gait Ambulation/Gait assistance: Min assist Gait Distance (Feet): 15 Feet (x2) Assistive device: Rolling walker (2 wheels) Gait Pattern/deviations: Step-to pattern       General Gait Details: Cues for safety, sequence, technique, RW proximity. Walked to/from bathroom. Deferred hallway ambulation due to pt's breakfast arrived.   Stairs             Wheelchair Mobility    Modified Rankin (Stroke Patients Only)       Balance Overall balance assessment: Needs assistance         Standing balance support: Bilateral upper extremity supported, Reliant on assistive device for balance, During functional activity Standing balance-Leahy Scale: Poor                              Cognition Arousal/Alertness: Awake/alert Behavior During Therapy: WFL for tasks assessed/performed Overall Cognitive Status: Within Functional Limits for tasks assessed                                          Exercises Total Joint Exercises Ankle Circles/Pumps: AROM, Both, 10 reps Quad Sets: AROM, Both, 10 reps Heel Slides: AAROM, Left, 10 reps Hip ABduction/ADduction: AAROM, Left, 10 reps Straight Leg Raises: AAROM, Left, 10 reps Goniometric ROM: ~10-65 degrees    General Comments        Pertinent Vitals/Pain Pain Assessment Pain Assessment: 0-10 Pain Score: 6  Pain Location:  L knee Pain Descriptors / Indicators: Discomfort, Sore Pain Intervention(s): Limited activity within patient's tolerance, Monitored during session, Premedicated before session, Repositioned, Ice applied    Home Living                          Prior Function            PT Goals (current goals can now be found in the care plan section) Progress towards PT goals: Progressing toward goals    Frequency    7X/week      PT Plan  Current plan remains appropriate    Co-evaluation              AM-PAC PT "6 Clicks" Mobility   Outcome Measure  Help needed turning from your back to your side while in a flat bed without using bedrails?: A Little Help needed moving from lying on your back to sitting on the side of a flat bed without using bedrails?: A Little Help needed moving to and from a bed to a chair (including a wheelchair)?: A Little Help needed standing up from a chair using your arms (e.g., wheelchair or bedside chair)?: A Little Help needed to walk in hospital room?: A Little Help needed climbing 3-5 steps with a railing? : A Lot 6 Click Score: 17    End of Session Equipment Utilized During Treatment: Gait belt Activity Tolerance: Patient tolerated treatment well Patient left: in chair;with call bell/phone within reach;with family/visitor present   PT Visit Diagnosis: Other abnormalities of gait and mobility (R26.89);Pain Pain - Right/Left: Left Pain - part of body: Knee     Time: 0920-0948 PT Time Calculation (min) (ACUTE ONLY): 28 min  Charges:  $Gait Training: 8-22 mins $Therapeutic Exercise: 8-22 mins                        Doreatha Massed, PT Acute Rehabilitation  Office: [phone] Pager: [phone]

## 2021-02-17 NOTE — Progress Notes (Addendum)
Per PA K. Edmisten, if patient does not feel good and not confident about going home today, OK to keep another night and will send home tomorrow.  Patient and her 2 children made aware.

## 2021-02-17 NOTE — Progress Notes (Addendum)
Physical Therapy Treatment Patient Details Name: [name] MRN: [id number] DOB: May 31, 1945 Today's Date: 02/17/2021   History of Present Illness Pt s/p LTKA 02/16/2022 ,with PMH of arthitis, bipolar, and GERD. Pt reports she has been having difficulty getting around recently and using a cane. The R knee is just as bad and she has difficulties and pain in it as well.    PT Comments    Progressing with mobility. Reviewed/practiced gait and stair step training. All education completed. Pt could have discharged home today (she met her PT goals) however RN stated pt would remain in hospital until tomorrow (MD approved). Pt should be fine to go home after morning session.     Recommendations for follow up therapy are one component of a multi-disciplinary discharge planning process, led by the attending physician.  Recommendations may be updated based on patient status, additional functional criteria and insurance authorization.  Follow Up Recommendations  Follow physician's recommendations for discharge plan and follow up therapies     Assistance Recommended at Discharge Frequent or constant Supervision/Assistance  Patient can return home with the following A little help with walking and/or transfers;A little help with bathing/dressing/bathroom;Assistance with cooking/housework;Help with stairs or ramp for entrance;Assist for transportation   Equipment Recommendations  None recommended by PT    Recommendations for Other Services       Precautions / Restrictions Precautions Precautions: Fall;Knee Restrictions Weight Bearing Restrictions: No Other Position/Activity Restrictions: WBAT     Mobility  Bed Mobility Overal bed mobility: Needs Assistance Bed Mobility: Supine to Sit     Supine to sit: Min assist, HOB elevated     General bed mobility comments: oob in recliner    Transfers Overall transfer level: Needs assistance Equipment used: Rolling walker (2  wheels) Transfers: Sit to/from Stand Sit to Stand: Min assist           General transfer comment: Assist to rise, steady, control descent. Cues for safety, technique, hand/LE placement. Increased time.    Ambulation/Gait Ambulation/Gait assistance: Min assist Gait Distance (Feet): 115 Feet Assistive device: Rolling walker (2 wheels) Gait Pattern/deviations: Step-to pattern, Step-through pattern, Decreased stride length       General Gait Details: Intermittent assist to steady and manage RW. Cues for safety, technique, sequence.   Stairs Stairs: Yes Stairs assistance: Min assist Stair Management: Forwards, Step to pattern, With walker Number of Stairs: 1 General stair comments: Cues for safety, technique, sequence.   Wheelchair Mobility    Modified Rankin (Stroke Patients Only)       Balance Overall balance assessment: Needs assistance         Standing balance support: Bilateral upper extremity supported, Reliant on assistive device for balance, During functional activity Standing balance-Leahy Scale: Poor                              Cognition Arousal/Alertness: Awake/alert Behavior During Therapy: WFL for tasks assessed/performed Overall Cognitive Status: Within Functional Limits for tasks assessed                                          Exercises Total Joint Exercises Ankle Circles/Pumps: AROM, Both, 10 reps Quad Sets: AROM, Both, 10 reps Heel Slides: AAROM, Left, 10 reps Hip ABduction/ADduction: AAROM, Left, 10 reps Straight Leg Raises: AAROM, Left, 10 reps Goniometric ROM: ~10-65 degrees  General Comments        Pertinent Vitals/Pain Pain Assessment Pain Assessment: Faces Pain Score: 6  Faces Pain Scale: Hurts little more Pain Location: L knee Pain Descriptors / Indicators: Discomfort, Sore Pain Intervention(s): Monitored during session, Ice applied, Repositioned    Home Living                           Prior Function            PT Goals (current goals can now be found in the care plan section) Progress towards PT goals: Progressing toward goals    Frequency    7X/week      PT Plan Current plan remains appropriate    Co-evaluation              AM-PAC PT "6 Clicks" Mobility   Outcome Measure  Help needed turning from your back to your side while in a flat bed without using bedrails?: A Little Help needed moving from lying on your back to sitting on the side of a flat bed without using bedrails?: A Little Help needed moving to and from a bed to a chair (including a wheelchair)?: A Little Help needed standing up from a chair using your arms (e.g., wheelchair or bedside chair)?: A Little Help needed to walk in hospital room?: A Little Help needed climbing 3-5 steps with a railing? : A Little 6 Click Score: 18    End of Session Equipment Utilized During Treatment: Gait belt Activity Tolerance: Patient tolerated treatment well Patient left: in chair;with call bell/phone within reach;with chair alarm set;with family/visitor present   PT Visit Diagnosis: Other abnormalities of gait and mobility (R26.89);Pain Pain - Right/Left: Left Pain - part of body: Knee     Time: 6803-2122 PT Time Calculation (min) (ACUTE ONLY): 20 min  Charges:  $Gait Training: 8-22 mins $Therapeutic Exercise: 8-22 mins                         Doreatha Massed, PT Acute Rehabilitation  Office: [phone] Pager: [phone]

## 2021-02-17 NOTE — Progress Notes (Addendum)
Patient verbalized she feels that she is not ready to go home.  She wants to ask the doctor is she can stay another night.  She states "I do not feel good, I feel weak.."  Message sent and left a voice message to [place].  PT Ozella Almond also made aware, will see patient again this afternoon.

## 2021-02-17 NOTE — Progress Notes (Signed)
Subjective: 1 Day Post-Op Procedure(s) (LRB): TOTAL KNEE ARTHROPLASTY (Left) Patient reports pain as mild.   Patient seen in rounds by Dr. Wynelle Link. Patient is well, and has had no acute complaints or problems No issues overnight. Denies chest pain, SOB, or calf pain. Foley catheter removed this AM.  We will continue therapy today, ambulated 10' yesterday in her room.   Objective: Vital signs in last 24 hours: Temp:  [98 F (36.7 C)-98.6 F (37 C)] 98.4 F (36.9 C) (01/24 0600) Pulse Rate:  [57-90] 90 (01/24 0600) Resp:  [10-19] 18 (01/24 0600) BP: (90-155)/(45-73) 155/73 (01/24 0600) SpO2:  [93 %-99 %] 97 % (01/24 0600) Weight:  [89.3 kg] 89.3 kg (01/23 0928)  Intake/Output from previous day:  Intake/Output Summary (Last 24 hours) at 02/17/2021 0724 Last data filed at 02/17/2021 0603 Gross per 24 hour  Intake 3736.75 ml  Output 4400 ml  Net -663.25 ml     Intake/Output this shift: No intake/output data recorded.  Labs: Recent Labs    02/17/21 0323  HGB 11.8*   Recent Labs    02/17/21 0323  WBC 21.6*  RBC 3.66*  HCT 34.4*  PLT 385   Recent Labs    02/17/21 0323  NA 133*  K 3.8  CL 101  CO2 23  BUN 10  CREATININE 0.55  GLUCOSE 249*  CALCIUM 8.4*   Recent Labs    02/16/21 0858  INR 0.9    Exam: General - Patient is Alert and Oriented Extremity - Neurologically intact Neurovascular intact Sensation intact distally Dorsiflexion/Plantar flexion intact Dressing - dressing C/D/I Motor Function - intact, moving foot and toes well on exam.   Past Medical History:  Diagnosis Date   Allergic rhinitis    Arthritis    B12 deficiency    Bipolar disorder (HCC)    Coronary artery disease    Depression    Essential hypertension    GERD (gastroesophageal reflux disease)    Hashimoto's thyroiditis    History of transient ischemic attack (TIA)    Or possibly migraine as well as right amaurosis fugax and ataxia - Dr. Erling Cruz   Hyperlipidemia     Hypothyroidism    PSVT (paroxysmal supraventricular tachycardia) (HCC)    Right bundle branch block     Assessment/Plan: 1 Day Post-Op Procedure(s) (LRB): TOTAL KNEE ARTHROPLASTY (Left) Principal Problem:   Osteoarthritis of knee Active Problems:   Primary osteoarthritis of left knee  Estimated body mass index is 36.58 kg/m as calculated from the following:   Height as of this encounter: 5' 1.5" (1.562 m).   Weight as of this encounter: 89.3 kg. Advance diet Up with therapy D/C IV fluids   Patient's anticipated LOS is less than 2 midnights, meeting these requirements: - Lives within 1 hour of care - Has a competent adult at home to recover with post-op recover - NO history of  - Chronic pain requiring opioids  - Diabetes  - Heart failure  - Heart attack  - Stroke  - DVT/VTE  - Respiratory Failure/COPD  - Renal failure  - Anemia  - Advanced Liver disease  DVT Prophylaxis - Aspirin Weight bearing as tolerated. Continue therapy.  Plan is to go Home after hospital stay. Plan for discharge later today if progresses with therapy and meeting goals. Scheduled for OPPT at [facility]). Follow-up in the office in 2 weeks.  The PDMP database was reviewed today prior to any opioid medications being prescribed to this patient.  Theresa Duty, PA-C  Orthopedic Surgery [phone] 02/17/2021, 7:24 AM

## 2021-02-18 DIAGNOSIS — M1712 Unilateral primary osteoarthritis, left knee: Secondary | ICD-10-CM | POA: Diagnosis not present

## 2021-02-18 LAB — BASIC METABOLIC PANEL
Anion gap: 7 (ref 5–15)
BUN: 9 mg/dL (ref 8–23)
CO2: 26 mmol/L (ref 22–32)
Calcium: 9 mg/dL (ref 8.9–10.3)
Chloride: 104 mmol/L (ref 98–111)
Creatinine, Ser: 0.43 mg/dL — ABNORMAL LOW (ref 0.44–1.00)
GFR, Estimated: >60 mL/min (ref >60)
Glucose, Bld: 179 mg/dL — ABNORMAL HIGH (ref 70–99)
Potassium: 4.4 mmol/L (ref 3.5–5.1)
Sodium: 137 mmol/L (ref 135–145)

## 2021-02-18 LAB — CBC
HCT: 35.3 % — ABNORMAL LOW (ref 36.0–46.0)
Hemoglobin: 11.9 g/dL — ABNORMAL LOW (ref 12.0–15.0)
MCH: 31.8 pg (ref 26.0–34.0)
MCHC: 33.7 g/dL (ref 30.0–36.0)
MCV: 94.4 fL (ref 80.0–100.0)
Platelets: 376 10*3/uL (ref 150–400)
RBC: 3.74 MIL/uL — ABNORMAL LOW (ref 3.87–5.11)
RDW: 12.7 % (ref 11.5–15.5)
WBC: 24.2 10*3/uL — ABNORMAL HIGH (ref 4.0–10.5)
nRBC: 0 % (ref 0.0–0.2)

## 2021-02-18 NOTE — Progress Notes (Signed)
Physical Therapy Treatment Patient Details Name: [name] MRN: [id number] DOB: 03-21-1945 Today's Date: 02/18/2021   History of Present Illness Pt s/p LTKA 02/16/2022 ,with PMH of arthitis, bipolar, and GERD. Pt reports she has been having difficulty getting around recently and using a cane. The R knee is just as bad and she has difficulties and pain in it as well.    PT Comments    Progressing with mobility. Reviewed/practiced exercises, gait training, and stair step training. All education completed.    Recommendations for follow up therapy are one component of a multi-disciplinary discharge planning process, led by the attending physician.  Recommendations may be updated based on patient status, additional functional criteria and insurance authorization.  Follow Up Recommendations  Follow physician's recommendations for discharge plan and follow up therapies     Assistance Recommended at Discharge Frequent or constant Supervision/Assistance  Patient can return home with the following A little help with walking and/or transfers;A little help with bathing/dressing/bathroom;Assistance with cooking/housework;Help with stairs or ramp for entrance;Assist for transportation   Equipment Recommendations  None recommended by PT    Recommendations for Other Services       Precautions / Restrictions Precautions Precautions: Fall;Knee Restrictions Weight Bearing Restrictions: No Other Position/Activity Restrictions: WBAT     Mobility  Bed Mobility               General bed mobility comments: oob in recliner    Transfers Overall transfer level: Needs assistance Equipment used: Rolling walker (2 wheels) Transfers: Sit to/from Stand Sit to Stand: Min assist           General transfer comment: Assist to rise, steady, control descent. Cues for safety, technique, hand/LE placement. Increased time.    Ambulation/Gait Ambulation/Gait assistance: Min guard, Min  assist Gait Distance (Feet): 75 Feet Assistive device: Rolling walker (2 wheels) Gait Pattern/deviations: Step-to pattern, Step-through pattern, Decreased stride length       General Gait Details: Intermittent assist to steady and manage RW. Cues for safety, technique, sequence.   Stairs Stairs: Yes Stairs assistance: Min assist Stair Management: Forwards, Step to pattern, With walker Number of Stairs: 1 General stair comments: Cues for safety, technique, sequence. Assist to steady pt.   Wheelchair Mobility    Modified Rankin (Stroke Patients Only)       Balance Overall balance assessment: Needs assistance         Standing balance support: Bilateral upper extremity supported, Reliant on assistive device for balance, During functional activity Standing balance-Leahy Scale: Poor                              Cognition Arousal/Alertness: Awake/alert Behavior During Therapy: WFL for tasks assessed/performed Overall Cognitive Status: Within Functional Limits for tasks assessed                                          Exercises Total Joint Exercises Ankle Circles/Pumps: AROM, Both, 10 reps Quad Sets: AROM, Both, 10 reps Heel Slides: AAROM, Left, 10 reps Hip ABduction/ADduction: AAROM, Left, 10 reps Straight Leg Raises: AAROM, Left, 10 reps    General Comments        Pertinent Vitals/Pain Pain Assessment Pain Assessment: Faces Faces Pain Scale: Hurts little more Pain Location: L knee Pain Descriptors / Indicators: Discomfort, Sore Pain Intervention(s): Monitored during session, Ice applied, Repositioned  Home Living                          Prior Function            PT Goals (current goals can now be found in the care plan section) Progress towards PT goals: Progressing toward goals    Frequency    7X/week      PT Plan Current plan remains appropriate    Co-evaluation              AM-PAC PT "6  Clicks" Mobility   Outcome Measure  Help needed turning from your back to your side while in a flat bed without using bedrails?: A Little Help needed moving from lying on your back to sitting on the side of a flat bed without using bedrails?: A Little Help needed moving to and from a bed to a chair (including a wheelchair)?: A Little Help needed standing up from a chair using your arms (e.g., wheelchair or bedside chair)?: A Little Help needed to walk in hospital room?: A Little Help needed climbing 3-5 steps with a railing? : A Little 6 Click Score: 18    End of Session Equipment Utilized During Treatment: Gait belt Activity Tolerance: Patient tolerated treatment well Patient left: in chair;with call bell/phone within reach;with family/visitor present   PT Visit Diagnosis: Other abnormalities of gait and mobility (R26.89);Pain Pain - Right/Left: Left Pain - part of body: Knee     Time: 1103-1594 PT Time Calculation (min) (ACUTE ONLY): 18 min  Charges:  $Gait Training: 8-22 mins                         Doreatha Massed, PT Acute Rehabilitation  Office: [phone] Pager: [phone]

## 2021-02-18 NOTE — Progress Notes (Signed)
° °  Subjective: 2 Days Post-Op Procedure(s) (LRB): TOTAL KNEE ARTHROPLASTY (Left) Patient reports pain as mild.   Patient seen in rounds by Dr. Wynelle Link. Patient is well, and has had no acute complaints or problems. Did well with therapy yesterday, but was hesitant about discharging. She is ready to go home this morning. Denies chest pain or SOB. Plan is to go Home after hospital stay.  Objective: Vital signs in last 24 hours: Temp:  [97.7 F (36.5 C)-98.1 F (36.7 C)] 98.1 F (36.7 C) (01/25 0542) Pulse Rate:  [71-92] 92 (01/25 0542) Resp:  [17-18] 18 (01/25 0542) BP: (145-154)/(52-64) 154/52 (01/25 0542) SpO2:  [95 %-98 %] 95 % (01/25 0542)  Intake/Output from previous day:  Intake/Output Summary (Last 24 hours) at 02/18/2021 0820 Last data filed at 02/17/2021 1200 Gross per 24 hour  Intake 107.08 ml  Output --  Net 107.08 ml    Intake/Output this shift: No intake/output data recorded.  Labs: Recent Labs    02/17/21 0323 02/18/21 0317  HGB 11.8* 11.9*   Recent Labs    02/17/21 0323 02/18/21 0317  WBC 21.6* 24.2*  RBC 3.66* 3.74*  HCT 34.4* 35.3*  PLT 385 376   Recent Labs    02/17/21 0323 02/18/21 0317  NA 133* 137  K 3.8 4.4  CL 101 104  CO2 23 26  BUN 10 9  CREATININE 0.55 0.43*  GLUCOSE 249* 179*  CALCIUM 8.4* 9.0   Recent Labs    02/16/21 0858  INR 0.9    Exam: General - Patient is Alert and Oriented Extremity - Neurologically intact Neurovascular intact Sensation intact distally Dorsiflexion/Plantar flexion intact Dressing/Incision - clean, dry, no drainage. Bulky dressing removed, Aquacel in place. Motor Function - intact, moving foot and toes well on exam.   Past Medical History:  Diagnosis Date   Allergic rhinitis    Arthritis    B12 deficiency    Bipolar disorder ([place])    Coronary artery disease    Depression    Essential hypertension    GERD (gastroesophageal reflux disease)    Hashimoto's thyroiditis    History of  transient ischemic attack (TIA)    Or possibly migraine as well as right amaurosis fugax and ataxia - Dr. Erling Cruz   Hyperlipidemia    Hypothyroidism    PSVT (paroxysmal supraventricular tachycardia) (HCC)    Right bundle branch block     Assessment/Plan: 2 Days Post-Op Procedure(s) (LRB): TOTAL KNEE ARTHROPLASTY (Left) Principal Problem:   Osteoarthritis of knee Active Problems:   Primary osteoarthritis of left knee  Estimated body mass index is 36.58 kg/m as calculated from the following:   Height as of this encounter: 5' 1.5" (1.562 m).   Weight as of this encounter: 89.3 kg. Up with therapy  DVT Prophylaxis - Aspirin Weight-bearing as tolerated  Plan for discharge after one session of PT this AM.  Theresa Duty, PA-C Orthopedic Surgery [phone] 02/18/2021, 8:20 AM

## 2021-02-19 ENCOUNTER — Ambulatory Visit (HOSPITAL_COMMUNITY): Payer: PPO | Admitting: Physical Therapy

## 2021-02-20 DIAGNOSIS — R531 Weakness: Secondary | ICD-10-CM | POA: Diagnosis not present

## 2021-02-20 DIAGNOSIS — Z96652 Presence of left artificial knee joint: Secondary | ICD-10-CM | POA: Diagnosis not present

## 2021-02-20 DIAGNOSIS — M25662 Stiffness of left knee, not elsewhere classified: Secondary | ICD-10-CM | POA: Diagnosis not present

## 2021-02-20 DIAGNOSIS — M25562 Pain in left knee: Secondary | ICD-10-CM | POA: Diagnosis not present

## 2021-02-23 DIAGNOSIS — R531 Weakness: Secondary | ICD-10-CM | POA: Diagnosis not present

## 2021-02-23 DIAGNOSIS — Z96652 Presence of left artificial knee joint: Secondary | ICD-10-CM | POA: Diagnosis not present

## 2021-02-23 DIAGNOSIS — M25662 Stiffness of left knee, not elsewhere classified: Secondary | ICD-10-CM | POA: Diagnosis not present

## 2021-02-23 DIAGNOSIS — M25562 Pain in left knee: Secondary | ICD-10-CM | POA: Diagnosis not present

## 2021-02-23 NOTE — Discharge Summary (Signed)
Patient ID: [name] MRN: [id number] DOB/AGE: March 20, 1945 76 y.o.  Admit date: 02/16/2021 Discharge date: 02/18/2021  Admission Diagnoses:  Principal Problem:   Osteoarthritis of knee Active Problems:   Primary osteoarthritis of left knee   Discharge Diagnoses:  Same  Past Medical History:  Diagnosis Date   Allergic rhinitis    Arthritis    B12 deficiency    Bipolar disorder ([place])    Coronary artery disease    Depression    Essential hypertension    GERD (gastroesophageal reflux disease)    Hashimoto's thyroiditis    History of transient ischemic attack (TIA)    Or possibly migraine as well as right amaurosis fugax and ataxia - Dr. Erling Cruz   Hyperlipidemia    Hypothyroidism    PSVT (paroxysmal supraventricular tachycardia) ([place])    Right bundle branch block     Surgeries: Procedure(s): TOTAL KNEE ARTHROPLASTY on 02/16/2021   Consultants:   Discharged Condition: Improved  Hospital Course: PAHOLA DIMMITT is an 76 y.o. female who was admitted 02/16/2021 for operative treatment ofOsteoarthritis of knee. Patient has severe unremitting pain that affects sleep, daily activities, and work/hobbies. After pre-op clearance the patient was taken to the operating room on 02/16/2021 and underwent  Procedure(s): TOTAL KNEE ARTHROPLASTY.    Patient was given perioperative antibiotics:  Anti-infectives (From admission, onward)    Start     Dose/Rate Route Frequency Ordered Stop   02/16/21 1800  ceFAZolin (ANCEF) IVPB 2g/100 mL premix        2 g 200 mL/hr over 30 Minutes Intravenous Every 6 hours 02/16/21 1551 02/17/21 0108   02/16/21 0900  ceFAZolin (ANCEF) IVPB 2g/100 mL premix        2 g 200 mL/hr over 30 Minutes Intravenous On call to O.R. 02/16/21 0857 02/16/21 1138        Patient was given sequential compression devices, early ambulation, and chemoprophylaxis to prevent DVT.  Patient benefited maximally from hospital stay and there were no complications.    Recent  vital signs: No data found.   Recent laboratory studies: No results for input(s): WBC, HGB, HCT, PLT, NA, K, CL, CO2, BUN, CREATININE, GLUCOSE, INR, CALCIUM in the last 72 hours.  Invalid input(s): PT, 2   Discharge Medications:   Allergies as of 02/18/2021       Reactions   Amoxicillin-pot Clavulanate Rash   Atenolol Rash   Penicillins Rash   Tolerated Cephalosporin Date: 02/17/21.   Sulfonamide Derivatives Rash        Medication List     TAKE these medications    acetaminophen 500 MG tablet Commonly known as: TYLENOL Take 1,000 mg by mouth every 6 (six) hours as needed for moderate pain or headache.   albuterol 108 (90 Base) MCG/ACT inhaler Commonly known as: VENTOLIN HFA Inhale 2 puffs into the lungs every 6 (six) hours as needed for wheezing or shortness of breath.   alendronate 70 MG tablet Commonly known as: FOSAMAX Take 1 tablet (70 mg total) by mouth every 7 (seven) days. Take with a full glass of water on an empty stomach.   aspirin 325 MG EC tablet Take 1 tablet (325 mg total) by mouth 2 (two) times daily for 20 days. Then take one 81 mg aspirin once a day for three weeks. Then discontinue aspirin.   atorvastatin 80 MG tablet Commonly known as: LIPITOR Take 1 tablet (80 mg total) by mouth daily.   buPROPion 300 MG 24 hr tablet Commonly known as: WELLBUTRIN  XL Take 300 mg by mouth daily.   calcium carbonate 500 MG chewable tablet Commonly known as: TUMS - dosed in mg elemental calcium Chew 4 tablets by mouth daily.   clonazePAM 0.5 MG tablet Commonly known as: KLONOPIN Take 0.25-0.5 mg by mouth in the morning, at noon, and at bedtime.   dexlansoprazole 60 MG capsule Commonly known as: DEXILANT Take 1 capsule (60 mg total) by mouth daily.   diltiazem 240 MG 24 hr capsule Commonly known as: CARDIZEM CD TAKE (1) CAPSULE BY MOUTH ONCE DAILY.   DULoxetine 60 MG capsule Commonly known as: CYMBALTA Take 60 mg by mouth 2 (two) times daily.   ICAPS  AREDS 2 PO Take 1 capsule by mouth 2 (two) times daily.   PreserVision AREDS Tabs Take by mouth.   levothyroxine 150 MCG tablet Commonly known as: SYNTHROID Take 1 tablet (150 mcg total) by mouth daily.   losartan 50 MG tablet Commonly known as: COZAAR Take 1 tablet (50 mg total) by mouth in the morning and at bedtime.   Lybalvi 5-10 MG Tabs Generic drug: OLANZapine-Samidorphan Take 1 tablet by mouth at bedtime.   methocarbamol 500 MG tablet Commonly known as: ROBAXIN Take 1 tablet (500 mg total) by mouth every 6 (six) hours as needed for muscle spasms.   oxyCODONE 5 MG immediate release tablet Commonly known as: Oxy IR/ROXICODONE Take 1-2 tablets (5-10 mg total) by mouth every 6 (six) hours as needed for moderate pain or severe pain.   pantoprazole 40 MG tablet Commonly known as: PROTONIX Take 40 mg by mouth daily before breakfast.   saccharomyces boulardii 250 MG capsule Commonly known as: FLORASTOR Take 250 mg by mouth daily.   solifenacin 10 MG tablet Commonly known as: VESICARE Take 1 tablet (10 mg total) by mouth daily.   SUPER B COMPLEX PO Take 1 tablet by mouth daily.   Vitamin D3 50 MCG (2000 UT) capsule Take 2,000 Units by mouth daily.   VITAMIN K2-VITAMIN D3 PO Take 1 tablet by mouth daily.               Discharge Care Instructions  (From admission, onward)           Start     Ordered   02/17/21 0000  Weight bearing as tolerated        02/17/21 0728   02/17/21 0000  Change dressing       Comments: You may remove the bulky bandage (ACE wrap and gauze) two days after surgery. You will have an adhesive waterproof bandage underneath. Leave this in place until your first follow-up appointment.   02/17/21 0728            Diagnostic Studies: No results found.  Disposition: Discharge disposition: 01-Home or Self Care       Discharge Instructions     Call MD / Call 911   Complete by: As directed    If you experience chest pain  or shortness of breath, CALL 911 and be transported to the hospital emergency room.  If you develope a fever above 101 F, pus (white drainage) or increased drainage or redness at the wound, or calf pain, call your surgeon's office.   Change dressing   Complete by: As directed    You may remove the bulky bandage (ACE wrap and gauze) two days after surgery. You will have an adhesive waterproof bandage underneath. Leave this in place until your first follow-up appointment.   Constipation Prevention   Complete  by: As directed    Drink plenty of fluids.  Prune juice may be helpful.  You may use a stool softener, such as Colace (over the counter) 100 mg twice a day.  Use MiraLax (over the counter) for constipation as needed.   Diet - low sodium heart healthy   Complete by: As directed    Do not put a pillow under the knee. Place it under the heel.   Complete by: As directed    Driving restrictions   Complete by: As directed    No driving for two weeks   Post-operative opioid taper instructions:   Complete by: As directed    POST-OPERATIVE OPIOID TAPER INSTRUCTIONS: It is important to wean off of your opioid medication as soon as possible. If you do not need pain medication after your surgery it is ok to stop day one. Opioids include: Codeine, Hydrocodone(Norco, Vicodin), Oxycodone(Percocet, oxycontin) and hydromorphone amongst others.  Long term and even short term use of opiods can cause: Increased pain response Dependence Constipation Depression Respiratory depression And more.  Withdrawal symptoms can include Flu like symptoms Nausea, vomiting And more Techniques to manage these symptoms Hydrate well Eat regular healthy meals Stay active Use relaxation techniques(deep breathing, meditating, yoga) Do Not substitute Alcohol to help with tapering If you have been on opioids for less than two weeks and do not have pain than it is ok to stop all together.  Plan to wean off of  opioids This plan should start within one week post op of your joint replacement. Maintain the same interval or time between taking each dose and first decrease the dose.  Cut the total daily intake of opioids by one tablet each day Next start to increase the time between doses. The last dose that should be eliminated is the evening dose.      TED hose   Complete by: As directed    Use stockings (TED hose) for three weeks on both leg(s).  You may remove them at night for sleeping.   Weight bearing as tolerated   Complete by: As directed         Follow-up Information     Aluisio, Pilar Plate, MD. Schedule an appointment as soon as possible for a visit in 2 week(s).   Specialty: Orthopedic Surgery Contact information: [address] [place] [address] [phone]                  Signed: Theresa Duty 02/23/2021, 2:35 PM

## 2021-02-26 DIAGNOSIS — M25562 Pain in left knee: Secondary | ICD-10-CM | POA: Diagnosis not present

## 2021-02-26 DIAGNOSIS — R531 Weakness: Secondary | ICD-10-CM | POA: Diagnosis not present

## 2021-02-26 DIAGNOSIS — Z96652 Presence of left artificial knee joint: Secondary | ICD-10-CM | POA: Diagnosis not present

## 2021-02-26 DIAGNOSIS — M25662 Stiffness of left knee, not elsewhere classified: Secondary | ICD-10-CM | POA: Diagnosis not present

## 2021-02-27 DIAGNOSIS — M25662 Stiffness of left knee, not elsewhere classified: Secondary | ICD-10-CM | POA: Diagnosis not present

## 2021-02-27 DIAGNOSIS — R531 Weakness: Secondary | ICD-10-CM | POA: Diagnosis not present

## 2021-02-27 DIAGNOSIS — M25562 Pain in left knee: Secondary | ICD-10-CM | POA: Diagnosis not present

## 2021-02-27 DIAGNOSIS — Z96652 Presence of left artificial knee joint: Secondary | ICD-10-CM | POA: Diagnosis not present

## 2021-03-03 DIAGNOSIS — M25562 Pain in left knee: Secondary | ICD-10-CM | POA: Diagnosis not present

## 2021-03-03 DIAGNOSIS — R531 Weakness: Secondary | ICD-10-CM | POA: Diagnosis not present

## 2021-03-03 DIAGNOSIS — Z96652 Presence of left artificial knee joint: Secondary | ICD-10-CM | POA: Diagnosis not present

## 2021-03-03 DIAGNOSIS — M25662 Stiffness of left knee, not elsewhere classified: Secondary | ICD-10-CM | POA: Diagnosis not present

## 2021-03-04 ENCOUNTER — Ambulatory Visit: Payer: PPO | Admitting: Internal Medicine

## 2021-03-04 DIAGNOSIS — Z96652 Presence of left artificial knee joint: Secondary | ICD-10-CM | POA: Diagnosis not present

## 2021-03-04 DIAGNOSIS — M25662 Stiffness of left knee, not elsewhere classified: Secondary | ICD-10-CM | POA: Diagnosis not present

## 2021-03-04 DIAGNOSIS — M25562 Pain in left knee: Secondary | ICD-10-CM | POA: Diagnosis not present

## 2021-03-04 DIAGNOSIS — R531 Weakness: Secondary | ICD-10-CM | POA: Diagnosis not present

## 2021-03-05 ENCOUNTER — Other Ambulatory Visit: Payer: Self-pay | Admitting: Internal Medicine

## 2021-03-05 DIAGNOSIS — Z96652 Presence of left artificial knee joint: Secondary | ICD-10-CM | POA: Diagnosis not present

## 2021-03-05 DIAGNOSIS — M25562 Pain in left knee: Secondary | ICD-10-CM | POA: Diagnosis not present

## 2021-03-05 DIAGNOSIS — R531 Weakness: Secondary | ICD-10-CM | POA: Diagnosis not present

## 2021-03-05 DIAGNOSIS — M25662 Stiffness of left knee, not elsewhere classified: Secondary | ICD-10-CM | POA: Diagnosis not present

## 2021-03-09 DIAGNOSIS — R531 Weakness: Secondary | ICD-10-CM | POA: Diagnosis not present

## 2021-03-09 DIAGNOSIS — M25562 Pain in left knee: Secondary | ICD-10-CM | POA: Diagnosis not present

## 2021-03-09 DIAGNOSIS — M25662 Stiffness of left knee, not elsewhere classified: Secondary | ICD-10-CM | POA: Diagnosis not present

## 2021-03-09 DIAGNOSIS — Z96652 Presence of left artificial knee joint: Secondary | ICD-10-CM | POA: Diagnosis not present

## 2021-03-10 ENCOUNTER — Other Ambulatory Visit: Payer: Self-pay | Admitting: Internal Medicine

## 2021-03-10 DIAGNOSIS — R350 Frequency of micturition: Secondary | ICD-10-CM

## 2021-03-11 DIAGNOSIS — M25562 Pain in left knee: Secondary | ICD-10-CM | POA: Diagnosis not present

## 2021-03-11 DIAGNOSIS — M25662 Stiffness of left knee, not elsewhere classified: Secondary | ICD-10-CM | POA: Diagnosis not present

## 2021-03-11 DIAGNOSIS — R531 Weakness: Secondary | ICD-10-CM | POA: Diagnosis not present

## 2021-03-11 DIAGNOSIS — Z96652 Presence of left artificial knee joint: Secondary | ICD-10-CM | POA: Diagnosis not present

## 2021-03-17 DIAGNOSIS — R531 Weakness: Secondary | ICD-10-CM | POA: Diagnosis not present

## 2021-03-17 DIAGNOSIS — M25662 Stiffness of left knee, not elsewhere classified: Secondary | ICD-10-CM | POA: Diagnosis not present

## 2021-03-17 DIAGNOSIS — M25562 Pain in left knee: Secondary | ICD-10-CM | POA: Diagnosis not present

## 2021-03-17 DIAGNOSIS — Z96652 Presence of left artificial knee joint: Secondary | ICD-10-CM | POA: Diagnosis not present

## 2021-03-19 DIAGNOSIS — M25562 Pain in left knee: Secondary | ICD-10-CM | POA: Diagnosis not present

## 2021-03-19 DIAGNOSIS — Z96652 Presence of left artificial knee joint: Secondary | ICD-10-CM | POA: Diagnosis not present

## 2021-03-19 DIAGNOSIS — M25662 Stiffness of left knee, not elsewhere classified: Secondary | ICD-10-CM | POA: Diagnosis not present

## 2021-03-19 DIAGNOSIS — R531 Weakness: Secondary | ICD-10-CM | POA: Diagnosis not present

## 2021-03-24 DIAGNOSIS — N3946 Mixed incontinence: Secondary | ICD-10-CM | POA: Diagnosis not present

## 2021-03-25 DIAGNOSIS — M25562 Pain in left knee: Secondary | ICD-10-CM | POA: Diagnosis not present

## 2021-03-25 DIAGNOSIS — Z96652 Presence of left artificial knee joint: Secondary | ICD-10-CM | POA: Diagnosis not present

## 2021-03-25 DIAGNOSIS — R531 Weakness: Secondary | ICD-10-CM | POA: Diagnosis not present

## 2021-03-25 DIAGNOSIS — M25662 Stiffness of left knee, not elsewhere classified: Secondary | ICD-10-CM | POA: Diagnosis not present

## 2021-03-26 DIAGNOSIS — M1712 Unilateral primary osteoarthritis, left knee: Secondary | ICD-10-CM | POA: Diagnosis not present

## 2021-03-26 DIAGNOSIS — Z471 Aftercare following joint replacement surgery: Secondary | ICD-10-CM | POA: Diagnosis not present

## 2021-03-26 DIAGNOSIS — Z96652 Presence of left artificial knee joint: Secondary | ICD-10-CM | POA: Diagnosis not present

## 2021-03-26 DIAGNOSIS — M25662 Stiffness of left knee, not elsewhere classified: Secondary | ICD-10-CM | POA: Diagnosis not present

## 2021-03-26 DIAGNOSIS — R531 Weakness: Secondary | ICD-10-CM | POA: Diagnosis not present

## 2021-03-26 DIAGNOSIS — M25562 Pain in left knee: Secondary | ICD-10-CM | POA: Diagnosis not present

## 2021-03-30 ENCOUNTER — Other Ambulatory Visit: Payer: Self-pay | Admitting: Internal Medicine

## 2021-03-31 ENCOUNTER — Other Ambulatory Visit: Payer: Self-pay | Admitting: *Deleted

## 2021-03-31 DIAGNOSIS — I10 Essential (primary) hypertension: Secondary | ICD-10-CM

## 2021-03-31 MED ORDER — LOSARTAN POTASSIUM 50 MG PO TABS: 50.0000 mg | ORAL_TABLET | Freq: Two times a day (BID) | ORAL | 1 refills | Status: DC

## 2021-04-01 ENCOUNTER — Ambulatory Visit: Payer: PPO | Admitting: Urology

## 2021-04-01 NOTE — Progress Notes (Deleted)
? ?Assessment: ?1. Urinary incontinence without sensory awareness   ?2. Stress incontinence, female   ?3. Detrusor instability   ? ? ? ?Plan: ?Discontinue Myrbetriq as this has not been effective in controlling her symptoms. ?Trial of Gemtesa 75 mg daily.  Samples provided. ?I advised patient to call the office with results of this medication in 3-4 weeks. ? ?Chief Complaint: ?No chief complaint on file. ? ? ?HPI: ?[name] is a 76 y.o. female who presents for continued evaluation of urinary incontinence.   ?At her initial visit in 10/22, she reported incontinence symptoms for approximately 1 month.  She has incontinence without sensory awareness.  She does report some urinary frequency.  No stress incontinence.  Her incontinence is more noticeable during the daytime.  She has nocturia 0-1 time.  No dysuria or gross hematuria.  No recent UTIs.  She is using 4-5 adult diapers per day.  She was given a prescription for Myrbetriq 25 mg which she had taken for 1 month but had not seen any improvement in her symptoms.  She does have problems with constipation and takes MiraLAX for this.  No fecal incontinence. ?PVR = 25 ml.  ?She was given a trial of Myrbetriq 50 mg daily at her visit in 10/22.  She continued with symptoms of frequency, nocturia, urgency, and incontinence without sensory awareness.  She did not notice any improvement in her symptoms with Myrbetriq.  No side effects noted. ?She was given a trial of Gemtesa 75 mg daily. ?She was evaluated with urodynamics on 03/24/2021.  Normal bladder capacity noted with normal sensation.  Bladder instability was noted without leakage.  Stress urinary incontinence noted with Valsalva leak point pressure of 80 cm of water and 66 cm of water.  Normal EMG activity.  No evidence of obstruction. ? ?Portions of the above documentation were copied from a prior visit for review purposes only. ? ?Allergies: ?Allergies  ?Allergen Reactions  ? Amoxicillin-Pot Clavulanate  Rash  ? Atenolol Rash  ? Penicillins Rash  ?  Tolerated Cephalosporin Date: 02/17/21. ? ?  ? Sulfonamide Derivatives Rash  ? ? ?PMH: ?Past Medical History:  ?Diagnosis Date  ? Allergic rhinitis   ? Arthritis   ? B12 deficiency   ? Bipolar disorder ([place])   ? Coronary artery disease   ? Depression   ? Essential hypertension   ? GERD (gastroesophageal reflux disease)   ? Hashimoto's thyroiditis   ? History of transient ischemic attack (TIA)   ? Or possibly migraine as well as right amaurosis fugax and ataxia - Dr. Erling Cruz  ? Hyperlipidemia   ? Hypothyroidism   ? PSVT (paroxysmal supraventricular tachycardia) ([place])   ? Right bundle branch block   ? ? ?PSH: ?Past Surgical History:  ?Procedure Laterality Date  ? CATARACT EXTRACTION W/PHACO Left 05/23/2020  ? Procedure: CATARACT EXTRACTION PHACO AND INTRAOCULAR LENS PLACEMENT LEFT EYE;  Surgeon: Baruch Goldmann, MD;  Location: [facility];  Service: Ophthalmology;  Laterality: Left;  left ?CDE=15.69  ? CATARACT EXTRACTION W/PHACO Right 06/13/2020  ? Procedure: CATARACT EXTRACTION PHACO AND INTRAOCULAR LENS PLACEMENT RIGHT EYE;  Surgeon: Baruch Goldmann, MD;  Location: [facility];  Service: Ophthalmology;  Laterality: Right;  right ?CDE=11.33  ? LAPAROSCOPIC CHOLECYSTECTOMY  2010  ? THYROIDECTOMY  2009  ? TOTAL KNEE ARTHROPLASTY Left 02/16/2021  ? Procedure: TOTAL KNEE ARTHROPLASTY;  Surgeon: Gaynelle Arabian, MD;  Location: [facility];  Service: Orthopedics;  Laterality: Left;  ? ? ?SH: ?Social History  ? ?Tobacco Use  ?  Smoking status: Former  ?  Packs/day: 0.50  ?  Years: 10.00  ?  Pack years: 5.00  ?  Types: Cigarettes  ?  Quit date: 01/26/1983  ?  Years since quitting: 38.2  ? Smokeless tobacco: Never  ? Tobacco comments:  ?  quit 30 years ago  ?Vaping Use  ? Vaping Use: Never used  ?Substance Use Topics  ? Alcohol use: No  ?  Alcohol/week: 0.0 standard drinks  ? Drug use: No  ? ? ?ROS: ?Constitutional:  Negative for fever, chills, weight loss ?CV: Negative for chest pain, previous MI,  hypertension ?Respiratory:  Negative for shortness of breath, wheezing, sleep apnea, frequent cough ?GI:  Negative for nausea, vomiting, bloody stool, GERD ? ?PE: ?There were no vitals taken for this visit. ?*** ? ?Results: ?U/A:   ?

## 2021-04-02 ENCOUNTER — Other Ambulatory Visit: Payer: Self-pay | Admitting: Internal Medicine

## 2021-04-07 ENCOUNTER — Telehealth: Payer: Self-pay | Admitting: Urology

## 2021-04-07 DIAGNOSIS — M25562 Pain in left knee: Secondary | ICD-10-CM | POA: Diagnosis not present

## 2021-04-07 DIAGNOSIS — R531 Weakness: Secondary | ICD-10-CM | POA: Diagnosis not present

## 2021-04-07 DIAGNOSIS — M25662 Stiffness of left knee, not elsewhere classified: Secondary | ICD-10-CM | POA: Diagnosis not present

## 2021-04-07 DIAGNOSIS — Z96652 Presence of left artificial knee joint: Secondary | ICD-10-CM | POA: Diagnosis not present

## 2021-04-07 NOTE — Telephone Encounter (Signed)
Please see patient request.

## 2021-04-07 NOTE — Telephone Encounter (Signed)
Patient has difficulty coming into the office.  She is requesting urodynamics study results.   ? ? ?

## 2021-04-08 ENCOUNTER — Encounter: Payer: Self-pay | Admitting: Urology

## 2021-04-08 ENCOUNTER — Encounter: Payer: Self-pay | Admitting: Dermatology

## 2021-04-08 ENCOUNTER — Ambulatory Visit: Payer: PPO | Admitting: Dermatology

## 2021-04-08 ENCOUNTER — Other Ambulatory Visit: Payer: Self-pay

## 2021-04-08 DIAGNOSIS — L814 Other melanin hyperpigmentation: Secondary | ICD-10-CM | POA: Diagnosis not present

## 2021-04-08 DIAGNOSIS — L738 Other specified follicular disorders: Secondary | ICD-10-CM | POA: Diagnosis not present

## 2021-04-08 DIAGNOSIS — L720 Epidermal cyst: Secondary | ICD-10-CM | POA: Diagnosis not present

## 2021-04-09 ENCOUNTER — Other Ambulatory Visit: Payer: Self-pay | Admitting: Urology

## 2021-04-21 ENCOUNTER — Encounter: Payer: Self-pay | Admitting: Dermatology

## 2021-04-21 NOTE — Progress Notes (Signed)
? ?  Follow-Up Visit ?  ?Subjective  ?[name] is a 76 y.o. female who presents for the following: Skin Problem (Lesion on nose x 2 months- starting to change appearence from black to red.). ? ?Check several lesions on face ?Location:  ?Duration:  ?Quality:  ?Associated Signs/Symptoms: ?Modifying Factors:  ?Severity:  ?Timing: ?Context:  ? ?Objective  ?Well appearing patient in no apparent distress; mood and affect are within normal limits. ?Right Temporal Scalp ?2 mm cream-colored papule with eccentric temple, typical dermoscopy ? ?Dorsum of Nose ?Half millimeter upper dermal white papule ? ?Dorsum of Nose ?Monochrome pink-tan 4 mm macule, no dermoscopic atypia ? ? ? ?A focused examination was performed including head and neck.. Relevant physical exam findings are noted in the Assessment and Plan. ? ? ?Assessment & Plan  ? ? ?Sebaceous gland hyperplasia ?Right Temporal Scalp ? ?Told of similar appearance of early [place] so if there were to be growth or bleeding return for biopsy ? ?Milia ?Dorsum of Nose ? ?May choose elective removal in future ? ?Solar lentigo ?Dorsum of Nose ? ?Discussed confirmatory biopsy but will leave unless there is clinical change ? ? ? ? ? ?I, Lavonna Monarch, MD, have reviewed all documentation for this visit.  The documentation on 04/21/21 for the exam, diagnosis, procedures, and orders are all accurate and complete. ?

## 2021-04-22 ENCOUNTER — Telehealth: Payer: Self-pay

## 2021-04-22 ENCOUNTER — Ambulatory Visit: Payer: PPO | Admitting: Urology

## 2021-04-22 NOTE — Telephone Encounter (Signed)
Patient called advising she had Urodynamics done 2/23. [facility] called her since she has an upcoming urodynamics study at the end of April. They wanted to know if you wished to proceed with the study or cancel and they can send the notes from the study done 2/23.  ?

## 2021-04-28 DIAGNOSIS — M25561 Pain in right knee: Secondary | ICD-10-CM | POA: Diagnosis not present

## 2021-04-28 DIAGNOSIS — Z96652 Presence of left artificial knee joint: Secondary | ICD-10-CM | POA: Diagnosis not present

## 2021-05-05 ENCOUNTER — Other Ambulatory Visit: Payer: Self-pay | Admitting: Internal Medicine

## 2021-05-06 ENCOUNTER — Other Ambulatory Visit: Payer: Self-pay | Admitting: Internal Medicine

## 2021-05-06 DIAGNOSIS — I10 Essential (primary) hypertension: Secondary | ICD-10-CM

## 2021-05-14 ENCOUNTER — Ambulatory Visit: Payer: PPO | Admitting: Internal Medicine

## 2021-05-14 DIAGNOSIS — R351 Nocturia: Secondary | ICD-10-CM | POA: Diagnosis not present

## 2021-05-14 DIAGNOSIS — N3946 Mixed incontinence: Secondary | ICD-10-CM | POA: Diagnosis not present

## 2021-05-14 DIAGNOSIS — R350 Frequency of micturition: Secondary | ICD-10-CM | POA: Diagnosis not present

## 2021-05-19 ENCOUNTER — Ambulatory Visit: Payer: PPO | Admitting: Urology

## 2021-05-21 ENCOUNTER — Other Ambulatory Visit (HOSPITAL_COMMUNITY): Payer: Self-pay | Admitting: Radiology

## 2021-05-21 ENCOUNTER — Other Ambulatory Visit (HOSPITAL_COMMUNITY): Payer: Self-pay | Admitting: Nurse Practitioner

## 2021-05-21 ENCOUNTER — Other Ambulatory Visit (HOSPITAL_COMMUNITY): Payer: Self-pay | Admitting: Internal Medicine

## 2021-05-21 ENCOUNTER — Other Ambulatory Visit (HOSPITAL_COMMUNITY): Payer: Self-pay | Admitting: General Practice

## 2021-05-21 DIAGNOSIS — Z1231 Encounter for screening mammogram for malignant neoplasm of breast: Secondary | ICD-10-CM

## 2021-05-26 ENCOUNTER — Ambulatory Visit (INDEPENDENT_AMBULATORY_CARE_PROVIDER_SITE_OTHER): Payer: PPO | Admitting: Internal Medicine

## 2021-05-26 ENCOUNTER — Encounter: Payer: Self-pay | Admitting: Internal Medicine

## 2021-05-26 VITALS — BP 122/62 | HR 71 | Resp 18 | Ht 62.0 in | Wt 198.6 lb

## 2021-05-26 DIAGNOSIS — R7303 Prediabetes: Secondary | ICD-10-CM | POA: Diagnosis not present

## 2021-05-26 DIAGNOSIS — Z23 Encounter for immunization: Secondary | ICD-10-CM

## 2021-05-26 DIAGNOSIS — J309 Allergic rhinitis, unspecified: Secondary | ICD-10-CM | POA: Diagnosis not present

## 2021-05-26 DIAGNOSIS — Z780 Asymptomatic menopausal state: Secondary | ICD-10-CM | POA: Insufficient documentation

## 2021-05-26 DIAGNOSIS — E782 Mixed hyperlipidemia: Secondary | ICD-10-CM | POA: Diagnosis not present

## 2021-05-26 DIAGNOSIS — E890 Postprocedural hypothyroidism: Secondary | ICD-10-CM | POA: Diagnosis not present

## 2021-05-26 DIAGNOSIS — I10 Essential (primary) hypertension: Secondary | ICD-10-CM | POA: Diagnosis not present

## 2021-05-26 DIAGNOSIS — I471 Supraventricular tachycardia: Secondary | ICD-10-CM

## 2021-05-26 DIAGNOSIS — Z6838 Body mass index (BMI) 38.0-38.9, adult: Secondary | ICD-10-CM | POA: Insufficient documentation

## 2021-05-26 DIAGNOSIS — L659 Nonscarring hair loss, unspecified: Secondary | ICD-10-CM | POA: Insufficient documentation

## 2021-05-26 DIAGNOSIS — R232 Flushing: Secondary | ICD-10-CM | POA: Insufficient documentation

## 2021-05-26 MED ORDER — LEVOCETIRIZINE DIHYDROCHLORIDE 5 MG PO TABS: 5.0000 mg | ORAL_TABLET | Freq: Every evening | ORAL | 11 refills | Status: DC

## 2021-05-26 NOTE — Assessment & Plan Note (Signed)
On atorvastatin 80 mg QD 

## 2021-05-26 NOTE — Progress Notes (Addendum)
Established Patient Office Visit  Subjective:  Patient ID: [name], female    DOB: 04-13-1945  Age: 76 y.o. MRN: [id number]  CC:  Chief Complaint  Patient presents with   Follow-up    4 month follow up labs same day? Would like right ear looked at this has been feeling full for awhile  also having sweats here and there     HPI [name] is a 76 y.o. female with past medical history of HTN, PSVT, GERD, hypothyroidism, HLD, mood disorder, OA and obesity who presents for f/u of her chronic medical conditions.  Hypothyroidism: She has been taking levothyroxine regularly.  Her dose of levothyroxine was increased to 150 mcg daily as her TSH was elevated about 2 months ago.  She has been having excessive sweating for the last few weeks.  She reports chronic fatigue and alopecia as well, which could be related to uncontrolled hypothyroidism.  Denies any recent change in appetite or weight.  Denies any tremors or palpitations currently.  Denies any fever, chills or LAD.  HTN: BP is well-controlled. Takes medications regularly. Patient denies headache, dizziness, chest pain, dyspnea or palpitations.  She complains of chronic nasal congestion, postnasal drip and right ear fullness.  Denies any ear pain or discharge currently.  Denies any fever, chills, sore throat, dyspnea or wheezing currently.  She received PPSV23 in the office today.    Past Medical History:  Diagnosis Date   Allergic rhinitis    Arthritis    B12 deficiency    Bipolar disorder ([place])    Coronary artery disease    Depression    Essential hypertension    GERD (gastroesophageal reflux disease)    Hashimoto's thyroiditis    History of transient ischemic attack (TIA)    Or possibly migraine as well as right amaurosis fugax and ataxia - Dr. Erling Cruz   Hyperlipidemia    Hypothyroidism    PSVT (paroxysmal supraventricular tachycardia) ([place])    Right bundle branch block     Past Surgical History:  Procedure  Laterality Date   CATARACT EXTRACTION W/PHACO Left 05/23/2020   Procedure: CATARACT EXTRACTION PHACO AND INTRAOCULAR LENS PLACEMENT LEFT EYE;  Surgeon: Baruch Goldmann, MD;  Location: [facility];  Service: Ophthalmology;  Laterality: Left;  left CDE=15.69   CATARACT EXTRACTION W/PHACO Right 06/13/2020   Procedure: CATARACT EXTRACTION PHACO AND INTRAOCULAR LENS PLACEMENT RIGHT EYE;  Surgeon: Baruch Goldmann, MD;  Location: [facility];  Service: Ophthalmology;  Laterality: Right;  right CDE=11.33   LAPAROSCOPIC CHOLECYSTECTOMY  2010   THYROIDECTOMY  2009   TOTAL KNEE ARTHROPLASTY Left 02/16/2021   Procedure: TOTAL KNEE ARTHROPLASTY;  Surgeon: Gaynelle Arabian, MD;  Location: [facility];  Service: Orthopedics;  Laterality: Left;    Family History  Problem Relation Age of Onset   Stroke Mother    Thyroid disease Mother    Depression Father    Hypertension Sister    Aneurysm Sister    Diabetes Brother    Heart attack Brother    Hypertension Brother    Heart failure Brother    Thyroid disease Brother    Stroke Maternal Grandmother    Heart failure Maternal Grandmother    Heart failure Maternal Grandfather    Stroke Maternal Grandfather    Heart failure Paternal Grandmother     Social History   Socioeconomic History   Marital status: Widowed    Spouse name: Not on file   Number of children: 2   Years of education: 58  Highest education level: Not on file  Occupational History   Occupation: homemaker    Employer: RETIRED  Tobacco Use   Smoking status: Former    Packs/day: 0.50    Years: 10.00    Pack years: 5.00    Types: Cigarettes    Quit date: 01/26/1983    Years since quitting: 38.3   Smokeless tobacco: Never   Tobacco comments:    quit 30 years ago  Vaping Use   Vaping Use: Never used  Substance and Sexual Activity   Alcohol use: No    Alcohol/week: 0.0 standard drinks   Drug use: No   Sexual activity: Not Currently    Birth control/protection: Post-menopausal  Other Topics  Concern   Not on file  Social History Narrative   Regular exercise: walk when able   Caffeine use: occasionally   Social Determinants of Health   Financial Resource Strain: Low Risk    Difficulty of Paying Living Expenses: Not hard at all  Food Insecurity: No Food Insecurity   Worried About Charity fundraiser in the Last Year: Never true   Ran Out of Food in the Last Year: Never true  Transportation Needs: No Transportation Needs   Lack of Transportation (Medical): No   Lack of Transportation (Non-Medical): No  Physical Activity: Inactive   Days of Exercise per Week: 0 days   Minutes of Exercise per Session: 0 min  Stress: No Stress Concern Present   Feeling of Stress : Not at all  Social Connections: Moderately Isolated   Frequency of Communication with Friends and Family: More than three times a week   Frequency of Social Gatherings with Friends and Family: Never   Attends Religious Services: More than 4 times per year   Active Member of [employer] or Organizations: No   Attends Archivist Meetings: Never   Marital Status: Widowed  Human resources officer Violence: Not At Risk   Fear of Current or Ex-Partner: No   Emotionally Abused: No   Physically Abused: No   Sexually Abused: No    Outpatient Medications Prior to Visit  Medication Sig Dispense Refill   acetaminophen (TYLENOL) 500 MG tablet Take 1,000 mg by mouth every 6 (six) hours as needed for moderate pain or headache.     albuterol (VENTOLIN HFA) 108 (90 Base) MCG/ACT inhaler Inhale 2 puffs into the lungs every 6 (six) hours as needed for wheezing or shortness of breath. 18 g 0   alendronate (FOSAMAX) 70 MG tablet Take 1 tablet (70 mg total) by mouth every 7 (seven) days. Take with a full glass of water on an empty stomach. 4 tablet 11   B Complex-C (SUPER B COMPLEX PO) Take 1 tablet by mouth daily.     buPROPion (WELLBUTRIN XL) 300 MG 24 hr tablet Take 300 mg by mouth daily.      calcium carbonate (TUMS - DOSED IN  MG ELEMENTAL CALCIUM) 500 MG chewable tablet Chew 4 tablets by mouth daily.     Cholecalciferol (VITAMIN D3) 50 MCG (2000 UT) capsule Take 2,000 Units by mouth daily.     clonazePAM (KLONOPIN) 0.5 MG tablet Take 0.25-0.5 mg by mouth in the morning, at noon, and at bedtime.     DEXILANT 60 MG capsule Take 1 capsule (60 mg total) by mouth daily. 90 capsule 1   diltiazem (CARDIZEM CD) 240 MG 24 hr capsule TAKE (1) CAPSULE BY MOUTH ONCE DAILY. 90 capsule 3   DULoxetine (CYMBALTA) 60 MG capsule Take  60 mg by mouth 2 (two) times daily.      levothyroxine (SYNTHROID) 150 MCG tablet Take 1 tablet (150 mcg total) by mouth daily. 90 tablet 3   losartan (COZAAR) 50 MG tablet TAKE (1) TABLET BY MOUTH TWICE DAILY 60 tablet 0   LYBALVI 5-10 MG TABS Take 1 tablet by mouth at bedtime.     methocarbamol (ROBAXIN) 500 MG tablet Take 1 tablet (500 mg total) by mouth every 6 (six) hours as needed for muscle spasms. 40 tablet 0   Multiple Vitamins-Minerals (ICAPS AREDS 2 PO) Take 1 capsule by mouth 2 (two) times daily.     Multiple Vitamins-Minerals (PRESERVISION AREDS) TABS Take by mouth.     MYRBETRIQ 25 MG TB24 tablet TAKE (1) TABLET BY MOUTH ONCE DAILY. 30 tablet 0   oxyCODONE (OXY IR/ROXICODONE) 5 MG immediate release tablet Take 1-2 tablets (5-10 mg total) by mouth every 6 (six) hours as needed for moderate pain or severe pain. 42 tablet 0   pantoprazole (PROTONIX) 40 MG tablet TAKE 1 TABLET 30 MINUTES BEFORE BREAKFAST. 90 tablet 1   saccharomyces boulardii (FLORASTOR) 250 MG capsule Take 250 mg by mouth daily.     solifenacin (VESICARE) 10 MG tablet Take 1 tablet (10 mg total) by mouth daily. 30 tablet 11   Vitamin D-Vitamin K (VITAMIN K2-VITAMIN D3 PO) Take 1 tablet by mouth daily.     atorvastatin (LIPITOR) 80 MG tablet Take 1 tablet (80 mg total) by mouth daily. 90 tablet 3   No facility-administered medications prior to visit.    Allergies  Allergen Reactions   Amoxicillin-Pot Clavulanate Rash    Atenolol Rash   Penicillins Rash    Tolerated Cephalosporin Date: 02/17/21.     Sulfonamide Derivatives Rash    ROS Review of Systems  Constitutional:  Positive for fatigue. Negative for chills and fever.  HENT:  Negative for congestion, sinus pressure and sinus pain.   Eyes:  Negative for pain and discharge.  Respiratory:  Negative for cough and shortness of breath.   Cardiovascular:  Negative for chest pain and palpitations.  Gastrointestinal:  Negative for diarrhea, nausea and vomiting.  Genitourinary:  Negative for dysuria and hematuria.  Musculoskeletal:  Negative for neck pain and neck stiffness.  Skin:  Negative for rash.  Neurological:  Negative for dizziness and syncope.  Psychiatric/Behavioral:  Negative for agitation and behavioral problems.       Objective:    Physical Exam Vitals reviewed.  Constitutional:      General: She is not in acute distress.    Appearance: She is obese. She is not diaphoretic.  HENT:     Head: Normocephalic and atraumatic.     Right Ear: There is no impacted cerumen.     Left Ear: There is no impacted cerumen.     Nose: No congestion.     Mouth/Throat:     Mouth: Mucous membranes are moist.     Pharynx: No posterior oropharyngeal erythema.  Eyes:     General: No scleral icterus.    Extraocular Movements: Extraocular movements intact.  Cardiovascular:     Rate and Rhythm: Normal rate and regular rhythm.     Pulses: Normal pulses.     Heart sounds: Normal heart sounds. No murmur heard. Pulmonary:     Breath sounds: Normal breath sounds. No wheezing or rales.  Musculoskeletal:     Cervical back: Neck supple. No tenderness.     Right lower leg: No edema.  Left lower leg: No edema.  Skin:    General: Skin is warm.     Findings: No rash.  Neurological:     General: No focal deficit present.     Mental Status: She is alert and oriented to person, place, and time.  Psychiatric:        Mood and Affect: Mood normal.         Behavior: Behavior normal.     BP 122/62 (BP Location: Right Arm, Patient Position: Sitting, Cuff Size: Normal)   Pulse 71   Resp 18   Ht '5\' 2"'  (1.575 m)   Wt 198 lb 9.6 oz (90.1 kg)   SpO2 95%   BMI 36.32 kg/m  Wt Readings from Last 3 Encounters:  05/26/21 198 lb 9.6 oz (90.1 kg)  02/16/21 196 lb 12.8 oz (89.3 kg)  02/03/21 196 lb 12.8 oz (89.3 kg)    Lab Results  Component Value Date   TSH 1.100 01/14/2021   Lab Results  Component Value Date   WBC 24.2 (H) 02/18/2021   HGB 11.9 (L) 02/18/2021   HCT 35.3 (L) 02/18/2021   MCV 94.4 02/18/2021   PLT 376 02/18/2021   Lab Results  Component Value Date   NA 137 02/18/2021   K 4.4 02/18/2021   CO2 26 02/18/2021   GLUCOSE 179 (H) 02/18/2021   BUN 9 02/18/2021   CREATININE 0.43 (L) 02/18/2021   BILITOT 0.7 02/03/2021   ALKPHOS 69 02/03/2021   AST 19 02/03/2021   ALT 27 02/03/2021   PROT 7.1 02/03/2021   ALBUMIN 4.0 02/03/2021   CALCIUM 9.0 02/18/2021   ANIONGAP 7 02/18/2021   EGFR 93 01/14/2021   Lab Results  Component Value Date   CHOL 198 08/20/2020   Lab Results  Component Value Date   HDL 83 08/20/2020   Lab Results  Component Value Date   LDLCALC 97 08/20/2020   Lab Results  Component Value Date   TRIG 103 08/20/2020   No results found for: CHOLHDL Lab Results  Component Value Date   HGBA1C 6.2 (H) 08/20/2020      Assessment & Plan:   Problem List Items Addressed This Visit       Cardiovascular and Mediastinum   Essential hypertension - Primary    BP Readings from Last 1 Encounters:  05/26/21 122/62  Well-controlled with Losartan 50 mg BID Counseled for compliance with the medications Advised DASH diet and moderate exercise/walking as tolerated       Relevant Orders   CMP14+EGFR   CBC with Differential/Platelet   PSVT (paroxysmal supraventricular tachycardia) ([place])    Well controlled with diltiazem Followed by Cardiology         Respiratory   Allergic sinusitis    Chronic  nasal congestion, postnasal drip and ear fullness could be due to allergic sinusitis Started Xyzal for allergies Nasal saline spray for nasal congestion       Relevant Medications   levocetirizine (XYZAL) 5 MG tablet     Endocrine   Postoperative hypothyroidism    Lab Results  Component Value Date   TSH 1.100 01/14/2021  Levothyroxine dose was increased to 150 mcg QD recently Has sweating recently, check TSH and free T4      Relevant Orders   TSH + free T4     Other   Hyperlipidemia    On atorvastatin 80 mg QD       Relevant Orders   Lipid Profile   Morbid obesity ([place])  Diet modification and moderate exercise advised       Other Visit Diagnoses     Prediabetes       Relevant Orders   Hemoglobin A1c   Need for pneumococcal vaccination       Relevant Orders   Pneumococcal polysaccharide vaccine 23-valent greater than or equal to 2yo subcutaneous/IM (Completed)       Meds ordered this encounter  Medications   levocetirizine (XYZAL) 5 MG tablet    Sig: Take 1 tablet (5 mg total) by mouth every evening.    Dispense:  30 tablet    Refill:  11    Follow-up: Return in about 4 months (around 09/26/2021) for Annual physical.    Lindell Spar, MD

## 2021-05-26 NOTE — Assessment & Plan Note (Signed)
Diet modification and moderate exercise advised. 

## 2021-05-26 NOTE — Assessment & Plan Note (Signed)
Lab Results  ?Component Value Date  ? TSH 1.100 01/14/2021  ? ?Levothyroxine dose was increased to 150 mcg QD recently ?Has sweating recently, check TSH and free T4 ?

## 2021-05-26 NOTE — Patient Instructions (Addendum)
Please use Debrox ear drop for ear wax. Avoid using any sharp objects for cleaning purposes. ? ?Please take Xyzal for allergies. ? ?Please continue taking other medications as prescribed. ?

## 2021-05-26 NOTE — Assessment & Plan Note (Addendum)
Chronic nasal congestion, postnasal drip and ear fullness could be due to allergic sinusitis ?Started Xyzal for allergies ?Nasal saline spray for nasal congestion ?

## 2021-05-26 NOTE — Assessment & Plan Note (Signed)
BP Readings from Last 1 Encounters:  ?05/26/21 122/62  ? ?Well-controlled with Losartan 50 mg BID ?Counseled for compliance with the medications ?Advised DASH diet and moderate exercise/walking as tolerated ? ?

## 2021-05-26 NOTE — Assessment & Plan Note (Signed)
Well controlled with diltiazem Followed by Cardiology 

## 2021-05-27 ENCOUNTER — Encounter: Payer: Self-pay | Admitting: Internal Medicine

## 2021-05-27 ENCOUNTER — Other Ambulatory Visit: Payer: Self-pay | Admitting: Internal Medicine

## 2021-05-27 DIAGNOSIS — E1169 Type 2 diabetes mellitus with other specified complication: Secondary | ICD-10-CM

## 2021-05-27 LAB — CBC WITH DIFFERENTIAL/PLATELET
Basophils Absolute: 0 10*3/uL (ref 0.0–0.2)
Basos: 0 %
EOS (ABSOLUTE): 0.3 10*3/uL (ref 0.0–0.4)
Eos: 3 %
Hematocrit: 40.9 % (ref 34.0–46.6)
Hemoglobin: 14.1 g/dL (ref 11.1–15.9)
Immature Grans (Abs): 0.1 10*3/uL (ref 0.0–0.1)
Immature Granulocytes: 1 %
Lymphocytes Absolute: 1.7 10*3/uL (ref 0.7–3.1)
Lymphs: 18 %
MCH: 31.5 pg (ref 26.6–33.0)
MCHC: 34.5 g/dL (ref 31.5–35.7)
MCV: 92 fL (ref 79–97)
Monocytes Absolute: 1.1 10*3/uL — ABNORMAL HIGH (ref 0.1–0.9)
Monocytes: 11 %
Neutrophils Absolute: 6.5 10*3/uL (ref 1.4–7.0)
Neutrophils: 67 %
Platelets: 456 10*3/uL — ABNORMAL HIGH (ref 150–450)
RBC: 4.47 x10E6/uL (ref 3.77–5.28)
RDW: 13.1 % (ref 11.7–15.4)
WBC: 9.7 10*3/uL (ref 3.4–10.8)

## 2021-05-27 LAB — LIPID PANEL
Chol/HDL Ratio: 2.5 ratio (ref 0.0–4.4)
Cholesterol, Total: 149 mg/dL (ref 100–199)
HDL: 59 mg/dL (ref >39)
LDL Chol Calc (NIH): 73 mg/dL (ref 0–99)
Triglycerides: 94 mg/dL (ref 0–149)
VLDL Cholesterol Cal: 17 mg/dL (ref 5–40)

## 2021-05-27 LAB — CMP14+EGFR
ALT: 29 IU/L (ref 0–32)
AST: 29 IU/L (ref 0–40)
Albumin/Globulin Ratio: 1.8 (ref 1.2–2.2)
Albumin: 4.3 g/dL (ref 3.7–4.7)
Alkaline Phosphatase: 120 IU/L (ref 44–121)
BUN/Creatinine Ratio: 12 (ref 12–28)
BUN: 8 mg/dL (ref 8–27)
Bilirubin Total: 0.3 mg/dL (ref 0.0–1.2)
CO2: 25 mmol/L (ref 20–29)
Calcium: 9.5 mg/dL (ref 8.7–10.3)
Chloride: 99 mmol/L (ref 96–106)
Creatinine, Ser: 0.67 mg/dL (ref 0.57–1.00)
Globulin, Total: 2.4 g/dL (ref 1.5–4.5)
Glucose: 93 mg/dL (ref 70–99)
Potassium: 4.3 mmol/L (ref 3.5–5.2)
Sodium: 139 mmol/L (ref 134–144)
Total Protein: 6.7 g/dL (ref 6.0–8.5)
eGFR: 92 mL/min/{1.73_m2} (ref >59)

## 2021-05-27 LAB — TSH+FREE T4
Free T4: 1.88 ng/dL — ABNORMAL HIGH (ref 0.82–1.77)
TSH: 0.716 u[IU]/mL (ref 0.450–4.500)

## 2021-05-27 LAB — HEMOGLOBIN A1C
Est. average glucose Bld gHb Est-mCnc: 146 mg/dL
Hgb A1c MFr Bld: 6.7 % — ABNORMAL HIGH (ref 4.8–5.6)

## 2021-05-27 MED ORDER — METFORMIN HCL 500 MG PO TABS: 500.0000 mg | ORAL_TABLET | Freq: Two times a day (BID) | ORAL | 3 refills | Status: DC

## 2021-05-28 ENCOUNTER — Telehealth: Payer: Self-pay

## 2021-05-28 NOTE — Telephone Encounter (Signed)
FYI, Patient called to let Dr Posey Pronto know the medicine that she takes for kidney incontinence ?Myrbetriq 25 mg. Patient is taken it daily.   ? ?Patient is asking does she need to bring in a urine sample and she does not know why she gets shortness of breath. ? ?Call back # [phone]. ? ?

## 2021-05-28 NOTE — Telephone Encounter (Signed)
Pt advised with verbal understanding 3 month follow up scheduled  ?

## 2021-06-08 ENCOUNTER — Other Ambulatory Visit: Payer: Self-pay | Admitting: Internal Medicine

## 2021-06-08 ENCOUNTER — Ambulatory Visit (HOSPITAL_COMMUNITY)
Admission: RE | Admit: 2021-06-08 | Discharge: 2021-06-08 | Disposition: A | Discharge status: Home / Self Care | Payer: PPO | Source: Ambulatory Visit | Attending: Nurse Practitioner | Admitting: Nurse Practitioner

## 2021-06-08 DIAGNOSIS — Z1231 Encounter for screening mammogram for malignant neoplasm of breast: Secondary | ICD-10-CM | POA: Diagnosis not present

## 2021-06-09 ENCOUNTER — Other Ambulatory Visit: Payer: Self-pay | Admitting: Internal Medicine

## 2021-06-10 ENCOUNTER — Telehealth: Payer: Self-pay | Admitting: Dermatology

## 2021-06-10 NOTE — Telephone Encounter (Signed)
Patient is calling to say that she was supposed to call if she had any changes to the lesions that were previously checked and patient states that some of them have a reddish tint to them.  Patient states that the reddish tint is very faint and you can barely see it... "like when you take your eyeglasses off and they leave a reddish imprint".   Patient does not want to schedule an appointment until she hears back from Lavonna Monarch, M.D. that she should come in. ?

## 2021-06-11 ENCOUNTER — Encounter: Payer: Self-pay | Admitting: Internal Medicine

## 2021-06-11 ENCOUNTER — Telehealth: Payer: Self-pay

## 2021-06-11 ENCOUNTER — Ambulatory Visit (INDEPENDENT_AMBULATORY_CARE_PROVIDER_SITE_OTHER): Payer: PPO | Admitting: Internal Medicine

## 2021-06-11 DIAGNOSIS — U071 COVID-19: Secondary | ICD-10-CM

## 2021-06-11 DIAGNOSIS — R110 Nausea: Secondary | ICD-10-CM

## 2021-06-11 MED ORDER — ONDANSETRON HCL 4 MG PO TABS: 4.0000 mg | ORAL_TABLET | Freq: Three times a day (TID) | ORAL | 0 refills | Status: AC | PRN

## 2021-06-11 NOTE — Patient Instructions (Signed)
Please take Zofran as needed for nausea.

## 2021-06-11 NOTE — Telephone Encounter (Signed)
Pt advised with verbal understanding  °

## 2021-06-11 NOTE — Progress Notes (Addendum)
Virtual Visit via Telephone Note   This visit type was conducted due to national recommendations for restrictions regarding the COVID-19 Pandemic (e.g. social distancing) in an effort to limit this patient's exposure and mitigate transmission in our community.  Due to her co-morbid illnesses, this patient is at least at moderate risk for complications without adequate follow up.  This format is felt to be most appropriate for this patient at this time.  The patient did not have access to video technology/had technical difficulties with video requiring transitioning to audio format only (telephone).  All issues noted in this document were discussed and addressed.  No physical exam could be performed with this format.  Evaluation Performed:  Follow-up visit  Date:  06/11/2021   ID:  [name], DOB Feb 08, 1945, MRN [id number]  Patient Location: Home Provider Location: Office/Clinic  Participants: Patient Location of Patient: Home Location of Provider: Telehealth Consent was obtain for visit to be over via telehealth. I verified that I am speaking with the correct person using two identifiers.  PCP:  Lindell Spar, MD   Chief Complaint: Cough and nausea  History of Present Illness:    [name] is a 76 y.o. female who has a televisit for complaint of cough and nausea since this morning.  Of note, she had Tdap vaccine yesterday and has been having mild fatigue and low-grade fever since this morning as well.  She denies any dyspnea or wheezing currently.  Denies any dysphagia, vomiting or diarrhea.  The patient does have symptoms concerning for COVID-19 infection (fever, chills, cough, or new shortness of breath).   Past Medical, Surgical, Social History, Allergies, and Medications have been Reviewed.  Past Medical History:  Diagnosis Date   Allergic rhinitis    Arthritis    B12 deficiency    Bipolar disorder ([place])    Coronary artery disease    Depression     Essential hypertension    GERD (gastroesophageal reflux disease)    Hashimoto's thyroiditis    History of transient ischemic attack (TIA)    Or possibly migraine as well as right amaurosis fugax and ataxia - Dr. Erling Cruz   Hyperlipidemia    Hypothyroidism    PSVT (paroxysmal supraventricular tachycardia) ([place])    Right bundle branch block    Past Surgical History:  Procedure Laterality Date   CATARACT EXTRACTION W/PHACO Left 05/23/2020   Procedure: CATARACT EXTRACTION PHACO AND INTRAOCULAR LENS PLACEMENT LEFT EYE;  Surgeon: Baruch Goldmann, MD;  Location: [facility];  Service: Ophthalmology;  Laterality: Left;  left CDE=15.69   CATARACT EXTRACTION W/PHACO Right 06/13/2020   Procedure: CATARACT EXTRACTION PHACO AND INTRAOCULAR LENS PLACEMENT RIGHT EYE;  Surgeon: Baruch Goldmann, MD;  Location: [facility];  Service: Ophthalmology;  Laterality: Right;  right CDE=11.33   LAPAROSCOPIC CHOLECYSTECTOMY  2010   THYROIDECTOMY  2009   TOTAL KNEE ARTHROPLASTY Left 02/16/2021   Procedure: TOTAL KNEE ARTHROPLASTY;  Surgeon: Gaynelle Arabian, MD;  Location: [facility];  Service: Orthopedics;  Laterality: Left;     Current Meds  Medication Sig   acetaminophen (TYLENOL) 500 MG tablet Take 1,000 mg by mouth every 6 (six) hours as needed for moderate pain or headache.   albuterol (VENTOLIN HFA) 108 (90 Base) MCG/ACT inhaler Inhale 2 puffs into the lungs every 6 (six) hours as needed for wheezing or shortness of breath.   alendronate (FOSAMAX) 70 MG tablet TAKE 1 TABLET EVERY WEEK IN THE MORNING 30 MIN BEFORE EATING WITH AN 8OZ GLASS  OF WATER (SIT UP 30 MIN)   B Complex-C (SUPER B COMPLEX PO) Take 1 tablet by mouth daily.   buPROPion (WELLBUTRIN XL) 300 MG 24 hr tablet Take 300 mg by mouth daily.    calcium carbonate (TUMS - DOSED IN MG ELEMENTAL CALCIUM) 500 MG chewable tablet Chew 4 tablets by mouth daily.   Cholecalciferol (VITAMIN D3) 50 MCG (2000 UT) capsule Take 2,000 Units by mouth daily.   clonazePAM (KLONOPIN) 0.5  MG tablet Take 0.25-0.5 mg by mouth in the morning, at noon, and at bedtime.   DEXILANT 60 MG capsule Take 1 capsule (60 mg total) by mouth daily.   diltiazem (CARDIZEM CD) 240 MG 24 hr capsule TAKE (1) CAPSULE BY MOUTH ONCE DAILY.   DULoxetine (CYMBALTA) 60 MG capsule Take 60 mg by mouth 2 (two) times daily.    levocetirizine (XYZAL) 5 MG tablet Take 1 tablet (5 mg total) by mouth every evening.   levothyroxine (SYNTHROID) 150 MCG tablet Take 1 tablet (150 mcg total) by mouth daily.   losartan (COZAAR) 50 MG tablet TAKE (1) TABLET BY MOUTH TWICE DAILY   LYBALVI 5-10 MG TABS Take 1 tablet by mouth at bedtime.   metFORMIN (GLUCOPHAGE) 500 MG tablet Take 1 tablet (500 mg total) by mouth 2 (two) times daily with a meal.   methocarbamol (ROBAXIN) 500 MG tablet Take 1 tablet (500 mg total) by mouth every 6 (six) hours as needed for muscle spasms.   Multiple Vitamins-Minerals (ICAPS AREDS 2 PO) Take 1 capsule by mouth 2 (two) times daily.   Multiple Vitamins-Minerals (PRESERVISION AREDS) TABS Take by mouth.   MYRBETRIQ 25 MG TB24 tablet TAKE (1) TABLET BY MOUTH ONCE DAILY.   pantoprazole (PROTONIX) 40 MG tablet TAKE 1 TABLET 30 MINUTES BEFORE BREAKFAST.   saccharomyces boulardii (FLORASTOR) 250 MG capsule Take 250 mg by mouth daily.   solifenacin (VESICARE) 10 MG tablet Take 1 tablet (10 mg total) by mouth daily.   Vitamin D-Vitamin K (VITAMIN K2-VITAMIN D3 PO) Take 1 tablet by mouth daily.     Allergies:   Amoxicillin-pot clavulanate, Atenolol, Penicillins, and Sulfonamide derivatives   ROS:   Please see the history of present illness.     All other systems reviewed and are negative.   Labs/Other Tests and Data Reviewed:    Recent Labs: 05/26/2021: ALT 29; BUN 8; Creatinine, Ser 0.67; Hemoglobin 14.1; Platelets 456; Potassium 4.3; Sodium 139; TSH 0.716   Recent Lipid Panel Lab Results  Component Value Date/Time   CHOL 149 05/26/2021 04:19 PM   TRIG 94 05/26/2021 04:19 PM   HDL 59  05/26/2021 04:19 PM   CHOLHDL 2.5 05/26/2021 04:19 PM   LDLCALC 73 05/26/2021 04:19 PM    Wt Readings from Last 3 Encounters:  05/26/21 198 lb 9.6 oz (90.1 kg)  02/16/21 196 lb 12.8 oz (89.3 kg)  02/03/21 196 lb 12.8 oz (89.3 kg)     ASSESSMENT & PLAN:    Nausea Fatigue, low-grade fever and nausea could be due to immunologic reaction from Tdap vaccine Zofran as needed for nausea Her cough likely due to her underlying allergic sinusitis -continue Xyzal and nasal saline spray as needed    Addendum: COVID 19 infection Tested positive at home Started Paxlovid Mucinex or Robitussin as needed for cough  Time:   Today, I have spent 11 minutes reviewing the chart, including problem list, medications, and with the patient with telehealth technology discussing the above problems.   Medication Adjustments/Labs and Tests Ordered: Current  medicines are reviewed at length with the patient today.  Concerns regarding medicines are outlined above.   Tests Ordered: No orders of the defined types were placed in this encounter.   Medication Changes: No orders of the defined types were placed in this encounter.    Note: This dictation was prepared with Dragon dictation along with smaller phrase technology. Similar sounding words can be transcribed inadequately or may not be corrected upon review. Any transcriptional errors that result from this process are unintentional.      Disposition:  Follow up  Signed, Lindell Spar, MD  06/11/2021 9:35 AM     [place]

## 2021-06-11 NOTE — Telephone Encounter (Signed)
Patient called just spoke to Dr Posey Pronto, patient forgot to mention that she been having sweats all over face and over body, wake up and her gown would be damp feeling. Can nurse give her a call [phone].

## 2021-06-12 MED ORDER — NIRMATRELVIR/RITONAVIR (PAXLOVID)TABLET: 3.0000 | ORAL_TABLET | Freq: Two times a day (BID) | ORAL | 0 refills | Status: AC

## 2021-06-12 NOTE — Addendum Note (Signed)
Addended byIhor Dow on: 06/12/2021 08:13 AM   Modules accepted: Orders, Level of Service

## 2021-06-18 ENCOUNTER — Telehealth: Payer: Self-pay | Admitting: Internal Medicine

## 2021-06-18 NOTE — Telephone Encounter (Signed)
Pt called stating she has a questions in regards to DM. Wants to know if you can please call her and discuss these questions & concerns with her?

## 2021-06-18 NOTE — Telephone Encounter (Signed)
Mailed to pt home

## 2021-06-18 NOTE — Telephone Encounter (Signed)
Pt would like to have print out on diabetes sent to her home

## 2021-06-24 ENCOUNTER — Encounter: Payer: Self-pay | Admitting: Internal Medicine

## 2021-06-25 ENCOUNTER — Other Ambulatory Visit: Payer: Self-pay | Admitting: *Deleted

## 2021-06-25 DIAGNOSIS — E1169 Type 2 diabetes mellitus with other specified complication: Secondary | ICD-10-CM

## 2021-06-30 NOTE — Telephone Encounter (Signed)
Patient is returning call about pathology results. 

## 2021-06-30 NOTE — Telephone Encounter (Signed)
Left patient a message to call back.

## 2021-07-01 ENCOUNTER — Encounter: Payer: Self-pay | Admitting: Family Medicine

## 2021-07-01 ENCOUNTER — Ambulatory Visit (INDEPENDENT_AMBULATORY_CARE_PROVIDER_SITE_OTHER): Payer: PPO | Admitting: Family Medicine

## 2021-07-01 VITALS — BP 122/82 | HR 81 | Ht 61.5 in | Wt 192.4 lb

## 2021-07-01 DIAGNOSIS — D509 Iron deficiency anemia, unspecified: Secondary | ICD-10-CM

## 2021-07-01 DIAGNOSIS — E038 Other specified hypothyroidism: Secondary | ICD-10-CM | POA: Diagnosis not present

## 2021-07-01 DIAGNOSIS — E538 Deficiency of other specified B group vitamins: Secondary | ICD-10-CM

## 2021-07-01 DIAGNOSIS — L603 Nail dystrophy: Secondary | ICD-10-CM

## 2021-07-01 NOTE — Assessment & Plan Note (Addendum)
-  c/o excessive sweating two to three times a week, palpitations, and feelings of high speed.  -patient is taking Synthroid 150 mcg and had slightly elevated free T4 levels on 05/26/21 -will check thyroid, iron and B12 levels -treatment of the underlying condition should rectify the brittleness of her nails, but interim, I advise the patient to take OTC biotin to improve nail firmness, hardness, and thickness -will make adjustments to thyroid medications as needed

## 2021-07-01 NOTE — Patient Instructions (Addendum)
I appreciate the opportunity to provide care to you today!    Labs: please stop by the lab today to get your blood drawn ( TSH, B12 and iron panel)  -biotin may improve lipid synthesis that supports keratinocyte binding in nail plate   -you can take 2,500 mcg biotin daily to improve nail firmness, hardness, and thickness   Please continue to a heart-healthy diet and increase your physical activities. Try to exercise for 14mns at least three times a week.      It was a pleasure to see you and I look forward to continuing to work together on your health and well-being. Please do not hesitate to call the office if you need care or have questions about your care.   Have a wonderful day and week. With Gratitude, GAlvira MondayMSN, FNP-BC

## 2021-07-01 NOTE — Progress Notes (Signed)
Established Patient Office Visit  Subjective:  Patient ID: [name], female    DOB: [birth year]  Age: 76 y.o. MRN: [id number]  CC:  Chief Complaint  Patient presents with   Nail Problem    Pt complains of index finger nail bed coming off on her left hand, has other health concerns would like to discuss them with you.     HPI [name] is a 76 y.o. female with past medical history of essential hypertension and hyperlipidemia presents with c/o excessive sweating two to three times a week, palpitations, and feelings of high speed. She takes synthroid 150 mcg and had slightly elevated free T4 levels on 05/26/21. Onset of symptoms was a week ago. She reports no pain, redness, or swelling of the nails but noted that the nail bed is coming off on the left index finger of her left hand.     Past Medical History:  Diagnosis Date   Allergic rhinitis    Arthritis    B12 deficiency    Bipolar disorder ([place])    Coronary artery disease    Depression    Essential hypertension    GERD (gastroesophageal reflux disease)    Hashimoto's thyroiditis    History of transient ischemic attack (TIA)    Or possibly migraine as well as right amaurosis fugax and ataxia - Dr. Erling Cruz   Hyperlipidemia    Hypothyroidism    PSVT (paroxysmal supraventricular tachycardia) ([place])    Right bundle branch block     Past Surgical History:  Procedure Laterality Date   CATARACT EXTRACTION W/PHACO Left 05/23/2020   Procedure: CATARACT EXTRACTION PHACO AND INTRAOCULAR LENS PLACEMENT LEFT EYE;  Surgeon: Baruch Goldmann, MD;  Location: [facility];  Service: Ophthalmology;  Laterality: Left;  left CDE=15.69   CATARACT EXTRACTION W/PHACO Right 06/13/2020   Procedure: CATARACT EXTRACTION PHACO AND INTRAOCULAR LENS PLACEMENT RIGHT EYE;  Surgeon: Baruch Goldmann, MD;  Location: [facility];  Service: Ophthalmology;  Laterality: Right;  right CDE=11.33   LAPAROSCOPIC CHOLECYSTECTOMY  2010   THYROIDECTOMY  2009   TOTAL KNEE  ARTHROPLASTY Left 02/16/2021   Procedure: TOTAL KNEE ARTHROPLASTY;  Surgeon: Gaynelle Arabian, MD;  Location: [facility];  Service: Orthopedics;  Laterality: Left;    Family History  Problem Relation Age of Onset   Stroke Mother    Thyroid disease Mother    Depression Father    Hypertension Sister    Aneurysm Sister    Diabetes Brother    Heart attack Brother    Hypertension Brother    Heart failure Brother    Thyroid disease Brother    Stroke Maternal Grandmother    Heart failure Maternal Grandmother    Heart failure Maternal Grandfather    Stroke Maternal Grandfather    Heart failure Paternal Grandmother     Social History   Socioeconomic History   Marital status: Widowed    Spouse name: Not on file   Number of children: 2   Years of education: 76   Highest education level: Not on file  Occupational History   Occupation: homemaker    Employer: RETIRED  Tobacco Use   Smoking status: Former    Packs/day: 0.50    Years: 10.00    Pack years: 5.00    Types: Cigarettes    Quit date: 01/26/1983    Years since quitting: 38.4   Smokeless tobacco: Never   Tobacco comments:    quit 30 years ago  Vaping Use   Vaping Use: Never  used  Substance and Sexual Activity   Alcohol use: No    Alcohol/week: 0.0 standard drinks   Drug use: No   Sexual activity: Not Currently    Birth control/protection: Post-menopausal  Other Topics Concern   Not on file  Social History Narrative   Regular exercise: walk when able   Caffeine use: occasionally   Social Determinants of Health   Financial Resource Strain: Low [name]    Difficulty of Paying Living Expenses: Not hard at all  Food Insecurity: No Food Insecurity   Worried About Charity fundraiser in the Last Year: Never true   Ran Out of Food in the Last Year: Never true  Transportation Needs: No Transportation Needs   Lack of Transportation (Medical): No   Lack of Transportation (Non-Medical): No  Physical Activity: Inactive   Days of  Exercise per Week: 0 days   Minutes of Exercise per Session: 0 min  Stress: No Stress Concern Present   Feeling of Stress : Not at all  Social Connections: Moderately Isolated   Frequency of Communication with Friends and Family: More than three times a week   Frequency of Social Gatherings with Friends and Family: Never   Attends Religious Services: More than 4 times per year   Active Member of [employer] or Organizations: No   Attends Archivist Meetings: Never   Marital Status: Widowed  Human resources officer Violence: Not At [name]   Fear of Current or Ex-Partner: No   Emotionally Abused: No   Physically Abused: No   Sexually Abused: No    Outpatient Medications Prior to Visit  Medication Sig Dispense Refill   acetaminophen (TYLENOL) 500 MG tablet Take 1,000 mg by mouth every 6 (six) hours as needed for moderate pain or headache.     albuterol (VENTOLIN HFA) 108 (90 Base) MCG/ACT inhaler Inhale 2 puffs into the lungs every 6 (six) hours as needed for wheezing or shortness of breath. 18 g 0   alendronate (FOSAMAX) 70 MG tablet TAKE 1 TABLET EVERY WEEK IN THE MORNING 30 MIN BEFORE EATING WITH AN 8OZ GLASS OF WATER (SIT UP 30 MIN) 4 tablet 0   B Complex-C (SUPER B COMPLEX PO) Take 1 tablet by mouth daily.     buPROPion (WELLBUTRIN XL) 300 MG 24 hr tablet Take 300 mg by mouth daily.      calcium carbonate (TUMS - DOSED IN MG ELEMENTAL CALCIUM) 500 MG chewable tablet Chew 4 tablets by mouth daily.     Cholecalciferol (VITAMIN D3) 50 MCG (2000 UT) capsule Take 2,000 Units by mouth daily.     clonazePAM (KLONOPIN) 0.5 MG tablet Take 0.25-0.5 mg by mouth in the morning, at noon, and at bedtime.     DEXILANT 60 MG capsule Take 1 capsule (60 mg total) by mouth daily. 90 capsule 1   diltiazem (CARDIZEM CD) 240 MG 24 hr capsule TAKE (1) CAPSULE BY MOUTH ONCE DAILY. 90 capsule 3   DULoxetine (CYMBALTA) 60 MG capsule Take 60 mg by mouth 2 (two) times daily.      levocetirizine (XYZAL) 5 MG tablet  Take 1 tablet (5 mg total) by mouth every evening. 30 tablet 11   levothyroxine (SYNTHROID) 150 MCG tablet Take 1 tablet (150 mcg total) by mouth daily. 90 tablet 3   losartan (COZAAR) 50 MG tablet TAKE (1) TABLET BY MOUTH TWICE DAILY 60 tablet 0   LYBALVI 5-10 MG TABS Take 1 tablet by mouth at bedtime.     metFORMIN (  GLUCOPHAGE) 500 MG tablet Take 1 tablet (500 mg total) by mouth 2 (two) times daily with a meal. 60 tablet 3   methocarbamol (ROBAXIN) 500 MG tablet Take 1 tablet (500 mg total) by mouth every 6 (six) hours as needed for muscle spasms. 40 tablet 0   Multiple Vitamins-Minerals (ICAPS AREDS 2 PO) Take 1 capsule by mouth 2 (two) times daily.     Multiple Vitamins-Minerals (PRESERVISION AREDS) TABS Take by mouth.     MYRBETRIQ 25 MG TB24 tablet TAKE (1) TABLET BY MOUTH ONCE DAILY. 30 tablet 0   ondansetron (ZOFRAN) 4 MG tablet Take 1 tablet (4 mg total) by mouth every 8 (eight) hours as needed for nausea or vomiting. 20 tablet 0   pantoprazole (PROTONIX) 40 MG tablet TAKE 1 TABLET 30 MINUTES BEFORE BREAKFAST. 90 tablet 1   saccharomyces boulardii (FLORASTOR) 250 MG capsule Take 250 mg by mouth daily.     solifenacin (VESICARE) 10 MG tablet Take 1 tablet (10 mg total) by mouth daily. 30 tablet 11   Vitamin D-Vitamin K (VITAMIN K2-VITAMIN D3 PO) Take 1 tablet by mouth daily.     atorvastatin (LIPITOR) 80 MG tablet Take 1 tablet (80 mg total) by mouth daily. 90 tablet 3   No facility-administered medications prior to visit.    Allergies  Allergen Reactions   Amoxicillin-Pot Clavulanate Rash   Atenolol Rash   Penicillins Rash    Tolerated Cephalosporin Date: 02/17/21.     Sulfonamide Derivatives Rash    ROS Review of Systems  Constitutional:  Positive for diaphoresis. Negative for fatigue and fever.  Cardiovascular:  Positive for palpitations (occassionally).  Endocrine: Negative for cold intolerance, heat intolerance, polydipsia, polyphagia and polyuria.  Skin:        index  finger of her nail bed coming off  Neurological:  Negative for dizziness.  Psychiatric/Behavioral:  Positive for sleep disturbance. Negative for self-injury and suicidal ideas.      Objective:    Physical Exam Constitutional:      Appearance: Normal appearance.  HENT:     Head: Normocephalic.  Cardiovascular:     Rate and Rhythm: Normal rate and regular rhythm.     Pulses: Normal pulses.     Heart sounds: Normal heart sounds.  Pulmonary:     Effort: Pulmonary effort is normal.     Breath sounds: Normal breath sounds.  Skin:    Capillary Refill: Capillary refill takes less than 2 seconds.     Comments: Brittle and chipped nails were noted at the nail bed with no pain with palpation  Neurological:     Mental Status: She is alert and oriented to person, place, and time.    BP 122/82   Pulse 81   Ht 5' 1.5" (1.562 m)   Wt 192 lb 6.4 oz (87.3 kg)   SpO2 96%   BMI 35.76 kg/m  Wt Readings from Last 3 Encounters:  07/01/21 192 lb 6.4 oz (87.3 kg)  05/26/21 198 lb 9.6 oz (90.1 kg)  02/16/21 196 lb 12.8 oz (89.3 kg)    Lab Results  Component Value Date   TSH 0.716 05/26/2021   Lab Results  Component Value Date   WBC 9.7 05/26/2021   HGB 14.1 05/26/2021   HCT 40.9 05/26/2021   MCV 92 05/26/2021   PLT 456 (H) 05/26/2021   Lab Results  Component Value Date   NA 139 05/26/2021   K 4.3 05/26/2021   CO2 25 05/26/2021   GLUCOSE 93 05/26/2021  BUN 8 05/26/2021   CREATININE 0.67 05/26/2021   BILITOT 0.3 05/26/2021   ALKPHOS 120 05/26/2021   AST 29 05/26/2021   ALT 29 05/26/2021   PROT 6.7 05/26/2021   ALBUMIN 4.3 05/26/2021   CALCIUM 9.5 05/26/2021   ANIONGAP 7 02/18/2021   EGFR 92 05/26/2021   Lab Results  Component Value Date   CHOL 149 05/26/2021   Lab Results  Component Value Date   HDL 59 05/26/2021   Lab Results  Component Value Date   LDLCALC 73 05/26/2021   Lab Results  Component Value Date   TRIG 94 05/26/2021   Lab Results  Component Value  Date   CHOLHDL 2.5 05/26/2021   Lab Results  Component Value Date   HGBA1C 6.7 (H) 05/26/2021      Assessment & Plan:   Problem List Items Addressed This Visit       Musculoskeletal and Integument   Onychoschizia    -c/o excessive sweating two to three times a week, palpitations, and feelings of high speed.  -patient is taking Synthroid 150 mcg and had slightly elevated free T4 levels on 05/26/21 -will check thyroid, iron and B12 levels -treatment of the underlying condition should rectify the brittleness of her nails, but interim, I advise the patient to take OTC biotin to improve nail firmness, hardness, and thickness -will make adjustments to thyroid medications as needed        Other   Iron deficiency anemia   Relevant Orders   Fe+TIBC+Fer   Other Visit Diagnoses     Other specified hypothyroidism    -  Primary   Relevant Orders   TSH + free T4   B12 deficiency       Relevant Orders   B12       No orders of the defined types were placed in this encounter.   Follow-up: No follow-ups on file.    Alvira Monday, FNP

## 2021-07-02 ENCOUNTER — Encounter: Payer: Self-pay | Admitting: Internal Medicine

## 2021-07-02 ENCOUNTER — Other Ambulatory Visit: Payer: Self-pay | Admitting: Family Medicine

## 2021-07-02 ENCOUNTER — Other Ambulatory Visit: Payer: Self-pay | Admitting: Internal Medicine

## 2021-07-02 ENCOUNTER — Telehealth: Payer: Self-pay

## 2021-07-02 DIAGNOSIS — E890 Postprocedural hypothyroidism: Secondary | ICD-10-CM

## 2021-07-02 DIAGNOSIS — E038 Other specified hypothyroidism: Secondary | ICD-10-CM

## 2021-07-02 LAB — TSH+FREE T4
Free T4: 2.25 ng/dL — ABNORMAL HIGH (ref 0.82–1.77)
TSH: 0.333 u[IU]/mL — ABNORMAL LOW (ref 0.450–4.500)

## 2021-07-02 MED ORDER — LEVOTHYROXINE SODIUM 125 MCG PO TABS: 125.0000 ug | ORAL_TABLET | Freq: Every day | ORAL | 3 refills | Status: DC

## 2021-07-02 NOTE — Telephone Encounter (Signed)
Attempted to call pt [name]

## 2021-07-02 NOTE — Telephone Encounter (Signed)
Patient returning [place] call. She is at home. Call back # [phone]

## 2021-07-02 NOTE — Progress Notes (Signed)
Please inform the patient that her thyroid levels are elevated. Given that her levels are elevated, I want her to stop taking her Synthroid medicine for 2 weeks and resume treatment after 2 weeks. I've decreased her synthroid to 178mg and want her to take it 4 days a week on an empty stomach daily. The prescription has been sent. I would like her to get labs after a month to reassess her thyroid levels.

## 2021-07-03 ENCOUNTER — Telehealth: Payer: Self-pay | Admitting: Internal Medicine

## 2021-07-03 LAB — IRON,TIBC AND FERRITIN PANEL
Ferritin: 88 ng/mL (ref 15–150)
Iron Saturation: 32 % (ref 15–55)
Iron: 102 ug/dL (ref 27–139)
Total Iron Binding Capacity: 320 ug/dL (ref 250–450)
UIBC: 218 ug/dL (ref 118–369)

## 2021-07-03 LAB — VITAMIN B12: Vitamin B-12: 605 pg/mL (ref 232–1245)

## 2021-07-03 NOTE — Telephone Encounter (Signed)
Returned pt phone call and clarified lab result notes from [place], pt stated understanding.

## 2021-07-03 NOTE — Telephone Encounter (Signed)
Pt called stating she is wanting more clarification on her most recent labs. She states she is somewhat confused on them. Can you please give her a call when available?

## 2021-07-09 ENCOUNTER — Other Ambulatory Visit: Payer: Self-pay | Admitting: Internal Medicine

## 2021-07-09 DIAGNOSIS — I10 Essential (primary) hypertension: Secondary | ICD-10-CM

## 2021-07-22 DIAGNOSIS — F3181 Bipolar II disorder: Secondary | ICD-10-CM | POA: Diagnosis not present

## 2021-07-30 NOTE — Progress Notes (Addendum)
Cardiology Office Note    Date:  07/31/2021   ID:  [name], DOB [birth year], MRN [id number]  PCP:  Carrie Spar, MD  Cardiologist:  Rozann Lesches, MD  Electrophysiologist:  None   Chief Complaint: f/u PSVT, mild CAD  History of Present Illness:   [name] is a 76 y.o. female with history of PSVT, PACs, PVCs seen on prior event monitor, DM, B12 deficiency, HTN, bipolar disorder, GERD, Hashimoto's thyroiditis, TIA vs migraine (previously seen by neuro), HLD, RBBB, minimal nonobstructive CAD by coronary CTA 03/2020 who presented for follow-up. Event monitor 05/2019 showed PSVT and occasional PACs/PVCs. Last echo 06/2019 EF 65-70%, mild LVH, mild LAE. Stress test 10/2019 was intermediate risk due to LVEF 43%, no ischemia or infarct, though EF felt falsely low due to LVEF having been normal by recent echo otherwise. Coronary CTA 03/2020 showed minimal nonobstructive disease as outlined below. Had recent abnormal thyroid function 06/2021 prompting dose adjustment.  She is seen back for follow-up today overall doing well from a cardiac standpoint. She denies any chest pain or SOB. No recent palpitations. We discussed having a home blood pressure monitor to have access to in case she ever does develop palpitations in the future. She does request to be evaluated for UTI. She states for several weeks she's been experiencing urinary frequency. She wonders if this was related to a recent medication change from her psychiatry team though it persisted after the medication was stopped. She said since she otherwise feels well she did not really feel the need to reach out to primary care for evaluation but wanted to mention while she was here. No dysuria, fevers, back pain, hematuria, or chills. Reports home glucose readings were not elevated but does not remember precise values.   Labwork independently reviewed: 06/2021 TSH 0.33, FT4 2.25 05/2021 LDL 73, trig 94, HDL 59, A1c 6.7, CBC ok except plt  456, K 4.3, Cr 0.67, LFTs wnl  Cardiology Studies:   Studies reviewed are outlined and summarized above. Reports included below if pertinent.   Cor CT 03/2020 ADDENDUM REPORT: 04/16/2020 11:29   CLINICAL DATA:  76 year old female with h/o hypertension, SVT and DOE.   EXAM: Cardiac/Coronary  CTA   TECHNIQUE: The patient was scanned on a [employer].   FINDINGS: A 100 kV prospective scan was triggered in the descending thoracic aorta at 111 HU's. Axial non-contrast 3 mm slices were carried out through the heart. The data set was analyzed on a dedicated work station and scored using the [place]. Gantry rotation speed was 250 msecs and collimation was .6 mm. 240 mg of PO Diltiazem and 0.8 mg of sl NTG were given. The 3D data set was reconstructed in 5% intervals of the 67-82 % of the R-R cycle. Diastolic phases were analyzed on a dedicated work station using MPR, MIP and VRT modes. The patient received 80 cc of contrast.   Aorta: Normal size. Mild diffuse atherosclerotic plaque. No dissection.   Aortic Valve:  Trileaflet.  No calcifications.   Coronary Arteries:  Normal coronary origin.  Right dominance.   RCA is a large dominant artery that gives rise to PDA and PLA. There is minimal non-obstructive plaque.   Left main is a large artery that gives rise to LAD and LCX arteries. Left main has no plaque.   LAD is a large vessel that gives rise to a large diagonal artery. There is mild eccentric calcified plaque in the proximal LAD with stenosis  0-25%. Mid and distal LAD has only minimal irregularities.   D1 is a large artery with no plaque.   LCX is a non-dominant artery that gives rise to one large OM1 branch. There is no plaque.   Other findings:   Normal pulmonary vein drainage into the left atrium.   Normal left atrial appendage without a thrombus.   Normal size of the pulmonary artery.   IMPRESSION: 1. Coronary calcium score of 86. This was  62 percentile for age and sex matched control.   2. Normal coronary origin with right dominance.   3. CAD-RADS 1. Minimal non-obstructive CAD (0-24%) in the proximal LAD. Consider non-atherosclerotic causes of chest pain. Consider preventive therapy and risk factor modification.     Electronically Signed   By: Ena Dawley   On: 04/16/2020 11:29  Nuc 2021 There was no ST segment deviation noted during stress. The study is normal. There are no perfusion defects consistent with prior infarct or current ischemia. The left ventricular ejection fraction is mildly decreased (43%). This is an intermediate risk study. Risk is based on decreased LVEF, there is no current myocardium at jeopardy. Consider correlating LVEF with echo.   Monitor 06/2019 ZIO XT reviewed.  7 days 2 hours analyzed.  Predominant rhythm is sinus with heart rate ranging from 54 bpm up to 113 bpm and average heart rate 77 bpm.  There were rare PACs and PVCs representing less than 1% total beats.  Episodes described as atrial fibrillation/flutter look to be more likely PSVT, fairly regular SVT with at times retrograde P waves.  Longest episode lasted for nearly 35 minutes.  Echo 06/2019     1. Left ventricular ejection fraction, by estimation, is 65 to 70%. The  left ventricle has normal function. The left ventricle has no regional  wall motion abnormalities. There is mild left ventricular hypertrophy.  Left ventricular diastolic parameters  are indeterminate.   2. Right ventricular systolic function is normal. The right ventricular  size is normal. Tricuspid regurgitation signal is inadequate for assessing  PA pressure.   3. Left atrial size was mildly dilated.   4. The mitral valve is grossly normal. Trivial mitral valve  regurgitation.   5. The aortic valve is tricuspid. Aortic valve regurgitation is not  visualized.   6. The inferior vena cava is normal in size with greater than 50%  respiratory variability,  suggesting right atrial pressure of 3 mmHg.     Past Medical History:  Diagnosis Date   Allergic rhinitis    Arthritis    B12 deficiency    Bipolar disorder ([place])    Coronary artery disease    Depression    Essential hypertension    GERD (gastroesophageal reflux disease)    Hashimoto's thyroiditis    History of transient ischemic attack (TIA)    Or possibly migraine as well as right amaurosis fugax and ataxia - Dr. Erling Cruz   Hyperlipidemia    Hypothyroidism    PSVT (paroxysmal supraventricular tachycardia) ([place])    Right bundle branch block     Past Surgical History:  Procedure Laterality Date   CATARACT EXTRACTION W/PHACO Left 05/23/2020   Procedure: CATARACT EXTRACTION PHACO AND INTRAOCULAR LENS PLACEMENT LEFT EYE;  Surgeon: Baruch Goldmann, MD;  Location: [facility];  Service: Ophthalmology;  Laterality: Left;  left CDE=15.69   CATARACT EXTRACTION W/PHACO Right 06/13/2020   Procedure: CATARACT EXTRACTION PHACO AND INTRAOCULAR LENS PLACEMENT RIGHT EYE;  Surgeon: Baruch Goldmann, MD;  Location: [facility];  Service:  Ophthalmology;  Laterality: Right;  right CDE=11.33   LAPAROSCOPIC CHOLECYSTECTOMY  2010   THYROIDECTOMY  2009   TOTAL KNEE ARTHROPLASTY Left 02/16/2021   Procedure: TOTAL KNEE ARTHROPLASTY;  Surgeon: Gaynelle Arabian, MD;  Location: [facility];  Service: Orthopedics;  Laterality: Left;    Current Medications: Current Meds  Medication Sig   acetaminophen (TYLENOL) 500 MG tablet Take 1,000 mg by mouth every 6 (six) hours as needed for moderate pain or headache.   albuterol (VENTOLIN HFA) 108 (90 Base) MCG/ACT inhaler Inhale 2 puffs into the lungs every 6 (six) hours as needed for wheezing or shortness of breath.   alendronate (FOSAMAX) 70 MG tablet TAKE 1 TABLET EVERY WEEK IN THE MORNING 30 MIN BEFORE EATING WITH AN 8OZ GLASS OF WATER (SIT UP 30 MIN)   atorvastatin (LIPITOR) 80 MG tablet Take 1 tablet (80 mg total) by mouth daily.   B Complex-C (SUPER B COMPLEX PO) Take 1 tablet by  mouth daily.   buPROPion (WELLBUTRIN XL) 300 MG 24 hr tablet Take 300 mg by mouth daily.    calcium carbonate (TUMS - DOSED IN MG ELEMENTAL CALCIUM) 500 MG chewable tablet Chew 4 tablets by mouth daily.   Cholecalciferol (VITAMIN D3) 50 MCG (2000 UT) capsule Take 2,000 Units by mouth daily.   clonazePAM (KLONOPIN) 0.5 MG tablet Take 0.25-0.5 mg by mouth in the morning, at noon, and at bedtime.   DEXILANT 60 MG capsule Take 1 capsule (60 mg total) by mouth daily.   diltiazem (CARDIZEM CD) 240 MG 24 hr capsule TAKE (1) CAPSULE BY MOUTH ONCE DAILY.   DULoxetine (CYMBALTA) 60 MG capsule Take 60 mg by mouth 2 (two) times daily.    levothyroxine (SYNTHROID) 125 MCG tablet Take 1 tablet (125 mcg total) by mouth daily.   losartan (COZAAR) 50 MG tablet TAKE (1) TABLET BY MOUTH TWICE DAILY   LYBALVI 5-10 MG TABS Take 1 tablet by mouth at bedtime.   metFORMIN (GLUCOPHAGE) 500 MG tablet Take 1 tablet (500 mg total) by mouth 2 (two) times daily with a meal.   Multiple Vitamins-Minerals (ICAPS AREDS 2 PO) Take 1 capsule by mouth 2 (two) times daily.   Multiple Vitamins-Minerals (PRESERVISION AREDS) TABS Take by mouth.   MYRBETRIQ 25 MG TB24 tablet TAKE (1) TABLET BY MOUTH ONCE DAILY.   ondansetron (ZOFRAN) 4 MG tablet Take 1 tablet (4 mg total) by mouth every 8 (eight) hours as needed for nausea or vomiting.   pantoprazole (PROTONIX) 40 MG tablet TAKE 1 TABLET 30 MINUTES BEFORE BREAKFAST.   saccharomyces boulardii (FLORASTOR) 250 MG capsule Take 250 mg by mouth daily.   Vitamin D-Vitamin K (VITAMIN K2-VITAMIN D3 PO) Take 1 tablet by mouth daily.      Allergies:   Amoxicillin-pot clavulanate, Atenolol, Penicillins, and Sulfonamide derivatives   Social History   Socioeconomic History   Marital status: Widowed    Spouse name: Not on file   Number of children: 2   Years of education: 70   Highest education level: Not on file  Occupational History   Occupation: homemaker    Employer: RETIRED  Tobacco  Use   Smoking status: Former    Packs/day: 0.50    Years: 10.00    Total pack years: 5.00    Types: Cigarettes    Quit date: 01/26/1983    Years since quitting: 38.5   Smokeless tobacco: Never   Tobacco comments:    quit 30 years ago  Vaping Use   Vaping Use: Never  used  Substance and Sexual Activity   Alcohol use: No    Alcohol/week: 0.0 standard drinks of alcohol   Drug use: No   Sexual activity: Not Currently    Birth control/protection: Post-menopausal  Other Topics Concern   Not on file  Social History Narrative   Regular exercise: walk when able   Caffeine use: occasionally   Social Determinants of Health   Financial Resource Strain: Low Risk  (02/13/2021)   Overall Financial Resource Strain (CARDIA)    Difficulty of Paying Living Expenses: Not hard at all  Food Insecurity: No Food Insecurity (02/13/2021)   Hunger Vital Sign    Worried About Running Out of Food in the Last Year: Never true    Ran Out of Food in the Last Year: Never true  Transportation Needs: No Transportation Needs (02/13/2021)   PRAPARE - Hydrologist (Medical): No    Lack of Transportation (Non-Medical): No  Physical Activity: Inactive (02/13/2021)   Exercise Vital Sign    Days of Exercise per Week: 0 days    Minutes of Exercise per Session: 0 min  Stress: No Stress Concern Present (02/13/2021)   [place]    Feeling of Stress : Not at all  Social Connections: Moderately Isolated (02/13/2021)   Social Connection and Isolation Panel [NHANES]    Frequency of Communication with Friends and Family: More than three times a week    Frequency of Social Gatherings with Friends and Family: Never    Attends Religious Services: More than 4 times per year    Active Member of [employer] or Organizations: No    Attends Archivist Meetings: Never    Marital Status: Widowed     Family History:  The patient's  family history includes Aneurysm in her sister; Depression in her father; Diabetes in her brother; Heart attack in her brother; Heart failure in her brother, maternal grandfather, maternal grandmother, and paternal grandmother; Hypertension in her brother and sister; Stroke in her maternal grandfather, maternal grandmother, and mother; Thyroid disease in her brother and mother.  ROS:   Please see the history of present illness.  All other systems are reviewed and otherwise negative.    EKG(s)/Additional Labs   EKG:  EKG is ordered today, personally reviewed, demonstrating NSR 68bpm with RBBB, nonspecific TW changes similar to prior  Recent Labs: 05/26/2021: ALT 29; BUN 8; Creatinine, Ser 0.67; Hemoglobin 14.1; Platelets 456; Potassium 4.3; Sodium 139 07/01/2021: TSH 0.333  Recent Lipid Panel    Component Value Date/Time   CHOL 149 05/26/2021 1619   TRIG 94 05/26/2021 1619   HDL 59 05/26/2021 1619   CHOLHDL 2.5 05/26/2021 1619   LDLCALC 73 05/26/2021 1619    PHYSICAL EXAM:    VS:  BP 128/70   Pulse 68   Ht 5' 1.5" (1.562 m)   Wt 190 lb 9.6 oz (86.5 kg)   SpO2 95%   BMI 35.43 kg/m   BMI: Body mass index is 35.43 kg/m.  GEN: Well nourished, well developed female in no acute distress HEENT: normocephalic, atraumatic Neck: no JVD, carotid bruits, or masses Cardiac: RRR; no murmurs, rubs, or gallops, no edema  Respiratory:  clear to auscultation bilaterally, normal work of breathing GI: soft, nontender, nondistended, + BS MS: no deformity or atrophy Skin: warm and dry, no rash Neuro:  Alert and Oriented x 3, Strength and sensation are intact, follows commands Psych: euthymic mood, full affect  Wt Readings from Last 3 Encounters:  07/31/21 190 lb 9.6 oz (86.5 kg)  07/01/21 192 lb 6.4 oz (87.3 kg)  05/26/21 198 lb 9.6 oz (90.1 kg)     ASSESSMENT & PLAN:   1. Mild CAD, hyperlipidemia goal LDL <70 - asymptomatic, no further workup needed at this time. She is not on ASA since  coronary plaque was minimal. Lipids are followed in primary care.  2. Essential HTN - controlled on present regimen. We discussed having a home BP cuff to monitor her blood pressure periodically.   3. PSVT - quiescent on diltiazem, continue for now. Baseline RBBB noted without symptoms to suggest bradycardia.  4. Urinary frequency - she has no other specific concerning signs of infection. She is not on a diuretic. She reports home blood sugars have overall looked okay. We'll check a BMET and UA today. If unrevealing, have recommended she f/u with her PCP to discuss.     Disposition: F/u with Dr. Domenic Polite or myself in 1 year.   Medication Adjustments/Labs and Tests Ordered: Current medicines are reviewed at length with the patient today.  Concerns regarding medicines are outlined above. Medication changes, Labs and Tests ordered today are summarized above and listed in the Patient Instructions accessible in Encounters.    Signed, Charlie Pitter, PA-C  07/31/2021 2:15 PM    [facility] in [place]. [place], [place] [address] Ph: [phone]; Fax [phone]

## 2021-07-31 ENCOUNTER — Other Ambulatory Visit: Payer: Self-pay | Admitting: Internal Medicine

## 2021-07-31 ENCOUNTER — Other Ambulatory Visit (HOSPITAL_COMMUNITY)
Admission: RE | Admit: 2021-07-31 | Discharge: 2021-07-31 | Disposition: A | Discharge status: Home / Self Care | Payer: PPO | Source: Ambulatory Visit | Attending: Physician Assistant | Admitting: Physician Assistant

## 2021-07-31 ENCOUNTER — Ambulatory Visit: Payer: PPO | Admitting: Physician Assistant

## 2021-07-31 ENCOUNTER — Other Ambulatory Visit: Payer: Self-pay

## 2021-07-31 ENCOUNTER — Encounter: Payer: Self-pay | Admitting: Physician Assistant

## 2021-07-31 VITALS — BP 128/70 | HR 68 | Ht 61.5 in | Wt 190.6 lb

## 2021-07-31 DIAGNOSIS — R300 Dysuria: Secondary | ICD-10-CM | POA: Insufficient documentation

## 2021-07-31 DIAGNOSIS — I2510 Atherosclerotic heart disease of native coronary artery without angina pectoris: Secondary | ICD-10-CM | POA: Diagnosis not present

## 2021-07-31 DIAGNOSIS — I10 Essential (primary) hypertension: Secondary | ICD-10-CM

## 2021-07-31 DIAGNOSIS — R350 Frequency of micturition: Secondary | ICD-10-CM

## 2021-07-31 DIAGNOSIS — I471 Supraventricular tachycardia: Secondary | ICD-10-CM | POA: Diagnosis not present

## 2021-07-31 DIAGNOSIS — E785 Hyperlipidemia, unspecified: Secondary | ICD-10-CM

## 2021-07-31 LAB — URINALYSIS, ROUTINE W REFLEX MICROSCOPIC
Bilirubin Urine: NEGATIVE
Glucose, UA: NEGATIVE mg/dL
Hgb urine dipstick: NEGATIVE
Ketones, ur: NEGATIVE mg/dL
Leukocytes,Ua: NEGATIVE
Nitrite: NEGATIVE
Protein, ur: NEGATIVE mg/dL
Specific Gravity, Urine: 1.011 (ref 1.005–1.030)
pH: 5 (ref 5.0–8.0)

## 2021-07-31 LAB — BASIC METABOLIC PANEL
Anion gap: 7 (ref 5–15)
BUN: 13 mg/dL (ref 8–23)
CO2: 24 mmol/L (ref 22–32)
Calcium: 9.3 mg/dL (ref 8.9–10.3)
Chloride: 102 mmol/L (ref 98–111)
Creatinine, Ser: 0.55 mg/dL (ref 0.44–1.00)
GFR, Estimated: >60 mL/min (ref >60)
Glucose, Bld: 93 mg/dL (ref 70–99)
Potassium: 4 mmol/L (ref 3.5–5.1)
Sodium: 133 mmol/L — ABNORMAL LOW (ref 135–145)

## 2021-07-31 LAB — PROTEIN / CREATININE RATIO, URINE
Creatinine, Urine: 55.9 mg/dL
Total Protein, Urine: <3 mg/dL

## 2021-07-31 NOTE — Patient Instructions (Signed)
Medication Instructions:  Your physician recommends that you continue on your current medications as directed. Please refer to the Current Medication list given to you today.   Labwork: BMET UA  Testing/Procedures: None  Follow-Up: Follow up with Melina Copa, PA-C or Dr. Domenic Polite in 1 year.   Any Other Special Instructions Will Be Listed Below (If Applicable).     If you need a refill on your cardiac medications before your next appointment, please call your pharmacy.

## 2021-08-01 LAB — URINE CULTURE: Culture: <10000 — AB

## 2021-08-05 ENCOUNTER — Encounter: Payer: PPO | Admitting: Nutrition

## 2021-08-05 ENCOUNTER — Ambulatory Visit: Payer: PPO | Admitting: Nutrition

## 2021-08-11 ENCOUNTER — Other Ambulatory Visit: Payer: Self-pay | Admitting: Cardiology

## 2021-08-19 ENCOUNTER — Encounter: Payer: Self-pay | Admitting: Nutrition

## 2021-08-19 ENCOUNTER — Encounter: Payer: PPO | Attending: Internal Medicine | Admitting: Nutrition

## 2021-08-19 DIAGNOSIS — E118 Type 2 diabetes mellitus with unspecified complications: Secondary | ICD-10-CM

## 2021-08-19 DIAGNOSIS — Z713 Dietary counseling and surveillance: Secondary | ICD-10-CM | POA: Insufficient documentation

## 2021-08-19 DIAGNOSIS — E559 Vitamin D deficiency, unspecified: Secondary | ICD-10-CM

## 2021-08-19 DIAGNOSIS — E1169 Type 2 diabetes mellitus with other specified complication: Secondary | ICD-10-CM | POA: Insufficient documentation

## 2021-08-19 DIAGNOSIS — I10 Essential (primary) hypertension: Secondary | ICD-10-CM

## 2021-08-19 DIAGNOSIS — E782 Mixed hyperlipidemia: Secondary | ICD-10-CM

## 2021-08-19 NOTE — Progress Notes (Signed)
Medical Nutrition Therapy  Appointment Start time:  1430  Appointment End time:  82  Primary concerns today: Obesity, Hyperlipidemia, Referral diagnosis: E 66.9, E78 and E11.8 Preferred learning style: no preference indicated  See and read Learning readiness:  ready  NUTRITION ASSESSMENT  76 yr old female with A1C 6.7%.  Has been on some medications for Hallucination and MD said that has made her gain weight. Steroids have made her BS go up. She notes she has memory issues and prefers to have things written down. Has a lot of balance issues. She has been working on losing some weight. Her weight did go up to 198 lbs. Working on losing her weight again. Lives by herself.   Diet is excessive in calories and carbs. She notes she tends to snack often.   Willing to work on eating more whole plant based foods. She doesn't cook and eats most meals out. Goes to the [employer] often. Willing to make better food choices.  Anthropometrics   Wt Readings from Last 3 Encounters:  07/31/21 190 lb 9.6 oz (86.5 kg)  07/01/21 192 lb 6.4 oz (87.3 kg)  05/26/21 198 lb 9.6 oz (90.1 kg)   Ht Readings from Last 3 Encounters:  07/31/21 5' 1.5" (1.562 m)  07/01/21 5' 1.5" (1.562 m)  05/26/21 '5\' 2"'$  (1.575 m)   There is no height or weight on file to calculate BMI. '@BMIFA'$ @ Facility age limit for growth %iles is 20 years. Facility age limit for growth %iles is 20 years.   Clinical Medical Hx: IBS, DM, Obesity, HTN, Hyperlipidemia Medications:  See chart Labs:  Lab Results  Component Value Date   HGBA1C 6.7 (H) 05/26/2021       Latest Ref Rng & Units 07/31/2021    2:53 PM 05/26/2021    4:19 PM 02/18/2021    3:17 AM  CMP  Glucose 70 - 99 mg/dL 93  93  179   BUN 8 - 23 mg/dL '13  8  9   '$ Creatinine 0.44 - 1.00 mg/dL 0.55  0.67  0.43   Sodium 135 - 145 mmol/L 133  139  137   Potassium 3.5 - 5.1 mmol/L 4.0  4.3  4.4   Chloride 98 - 111 mmol/L 102  99  104   CO2 22 - 32 mmol/L '24  25  26   '$ Calcium  8.9 - 10.3 mg/dL 9.3  9.5  9.0   Total Protein 6.0 - 8.5 g/dL  6.7    Total Bilirubin 0.0 - 1.2 mg/dL  0.3    Alkaline Phos 44 - 121 IU/L  120    AST 0 - 40 IU/L  29    ALT 0 - 32 IU/L  29     Lipid Panel     Component Value Date/Time   CHOL 149 05/26/2021 1619   TRIG 94 05/26/2021 1619   HDL 59 05/26/2021 1619   CHOLHDL 2.5 05/26/2021 1619   LDLCALC 73 05/26/2021 1619   LABVLDL 17 05/26/2021 1619     Notable Signs/Symptoms: fatigue, irregular eating pattern  Lifestyle & Dietary Hx Doesn't like to cook. Lives by herself.  Unstable on feet. Has fallen at times. Skips meals at times  Estimated daily fluid intake: 16 oz Supplements:  Sleep: varies-doesn't sleep well. Stays up and sleeps in. Stress / self-care: health issues, can't walk well or get around. Current average weekly physical activity: ADL  24-Hr Dietary Recall Usually eats 1-2 meals per day  Estimated Energy Needs  Calories: 1200 Carbohydrate: 135g Protein: 90g Fat: 44g   NUTRITION DIAGNOSIS  NB-1.1 Food and nutrition-related knowledge deficit As related to Obesity and prediabetes.  As evidenced by BMI > 30 and fasting blood sugars elevated. NUTRITION INTERVENTION  Nutrition education (E-1) on the following topics:  Nutrition and Diabetes education provided on My Plate, CHO counting, meal planning, portion sizes, timing of meals, avoiding snacks between meals unless having a low blood sugar, target ranges for A1C and blood sugars, signs/symptoms and treatment of hyper/hypoglycemia, monitoring blood sugars, taking medications as prescribed, benefits of exercising 30 minutes per day and prevention of complications of DM. Low Cnolesterol Low Sodium Diet Ways to cut down on empty calories.  Handouts Provided Include  The Plate Method  Meal Plan Card Diabetes Instructions.   Learning Style & Readiness for Change Teaching method utilized: Visual & Auditory  Demonstrated degree of understanding via: Teach Back   Barriers to learning/adherence to lifestyle change: none  Goals Established by Pt   Eat three meals per day Eat more whole food, plant based Drink only water Avoid snacks between meals Don't eat after 7 pm. Test blood sugars twice a day; before breakfast and bedtime. Walk as tolerated. Lose 2 lbs per month Get A1C down to 6%. Look into the Healthy Choice Power Bowl meals.    MONITORING & EVALUATION Dietary intake, weekly physical activity, and weight in 2 month.  Next Steps  Patient is to work on eating better balanced meals.[name]

## 2021-08-19 NOTE — Patient Instructions (Addendum)
Goals  Eat three meals per day Eat more whole food, plant based Drink only water Avoid snacks between meals Don't eat after 7 pm. Test blood sugars twice a day; before breakfast and bedtime. Walk as tolerated. Lose 2 lbs per month Get A1C down to 6%. Look into the Healthy Choice Power Bowl meals.

## 2021-08-22 ENCOUNTER — Other Ambulatory Visit: Payer: Self-pay | Admitting: Internal Medicine

## 2021-08-31 ENCOUNTER — Ambulatory Visit: Payer: PPO | Admitting: Internal Medicine

## 2021-09-22 ENCOUNTER — Ambulatory Visit: Payer: PPO | Admitting: Nutrition

## 2021-09-29 ENCOUNTER — Other Ambulatory Visit: Payer: Self-pay | Admitting: Internal Medicine

## 2021-09-29 DIAGNOSIS — M1711 Unilateral primary osteoarthritis, right knee: Secondary | ICD-10-CM | POA: Diagnosis not present

## 2021-09-29 DIAGNOSIS — M25561 Pain in right knee: Secondary | ICD-10-CM | POA: Diagnosis not present

## 2021-09-29 DIAGNOSIS — E1169 Type 2 diabetes mellitus with other specified complication: Secondary | ICD-10-CM

## 2021-09-30 ENCOUNTER — Telehealth: Payer: Self-pay | Admitting: Internal Medicine

## 2021-09-30 NOTE — Telephone Encounter (Signed)
Pt called stating her  phar has been waiting on a response for a refill on metFORMIN (GLUCOPHAGE) 500 MG tablet. Can you please refill? She has 1 left.    [place]

## 2021-09-30 NOTE — Telephone Encounter (Signed)
Please let patient know this was sent to pharmacy 09-29-21

## 2021-10-05 ENCOUNTER — Encounter: Payer: Self-pay | Admitting: Internal Medicine

## 2021-10-05 ENCOUNTER — Ambulatory Visit (INDEPENDENT_AMBULATORY_CARE_PROVIDER_SITE_OTHER): Payer: PPO | Admitting: Internal Medicine

## 2021-10-05 VITALS — BP 132/62 | HR 69 | Resp 18 | Ht 62.0 in | Wt 188.8 lb

## 2021-10-05 DIAGNOSIS — Z23 Encounter for immunization: Secondary | ICD-10-CM | POA: Diagnosis not present

## 2021-10-05 DIAGNOSIS — Z0001 Encounter for general adult medical examination with abnormal findings: Secondary | ICD-10-CM

## 2021-10-05 DIAGNOSIS — I10 Essential (primary) hypertension: Secondary | ICD-10-CM

## 2021-10-05 DIAGNOSIS — E1169 Type 2 diabetes mellitus with other specified complication: Secondary | ICD-10-CM | POA: Diagnosis not present

## 2021-10-05 DIAGNOSIS — E119 Type 2 diabetes mellitus without complications: Secondary | ICD-10-CM

## 2021-10-05 DIAGNOSIS — H6123 Impacted cerumen, bilateral: Secondary | ICD-10-CM

## 2021-10-05 DIAGNOSIS — E890 Postprocedural hypothyroidism: Secondary | ICD-10-CM | POA: Diagnosis not present

## 2021-10-05 HISTORY — DX: Type 2 diabetes mellitus without complications: E11.9

## 2021-10-05 NOTE — Assessment & Plan Note (Signed)

## 2021-10-05 NOTE — Assessment & Plan Note (Signed)
Lab Results  Component Value Date   HGBA1C 6.7 (H) 05/26/2021   New onset On Metformin Advised to follow diabetic diet On ARB and statin F/u CMP and lipid panel Diabetic foot exam: Today Diabetic eye exam: Advised to follow up with Ophthalmology for diabetic eye exam

## 2021-10-05 NOTE — Progress Notes (Signed)
Established Patient Office Visit  Subjective:  Patient ID: [name], female    DOB: Jul 31, 1945  Age: 76 y.o. MRN: [id number]  CC:  Chief Complaint  Patient presents with   Annual Exam    Annual exam pt has right ear pain, she has heavy sweats all the time and left index fingernail is messed up    HPI [name] is a 76 y.o. female with past medical history of HTN, PSVT, GERD, hypothyroidism, HLD, mood disorder, OA and obesity who presents for annual physical.  She has been taking levothyroxine regularly.  Her dose of levothyroxine was decreased to 125 mcg daily as her TSH was low about 3 months ago.  She has been having excessive sweating for the last few weeks.  She reports chronic fatigue and alopecia as well, which could be related to hypothyroidism.  Denies any recent change in appetite or weight.  Denies any tremors or palpitations currently.  Denies any fever, chills or LAD.  She also reports nail deformity of left index finger, but has been improving slowly with topical tolnaftate.  Type II DM: Her last HbA1c was 6.7.  She was started on metformin and has been tolerating it well.  Denies any polyuria or polyphagia currently.  HTN: BP is well-controlled. Takes medications regularly. Patient denies headache, dizziness, chest pain, dyspnea or palpitations.  She complains of right ear pain, which is intermittent for the last few days.  Denies any ear discharge, fever or chills.    Past Medical History:  Diagnosis Date   Allergic rhinitis    Arthritis    B12 deficiency    Bipolar disorder ([place])    Coronary artery disease    Depression    Diabetes mellitus ([place])    Diabetes mellitus ([place]) 10/05/2021   Essential hypertension    GERD (gastroesophageal reflux disease)    Hashimoto's thyroiditis    History of transient ischemic attack (TIA)    Or possibly migraine as well as right amaurosis fugax and ataxia - Dr. Erling Cruz   Hyperlipidemia    Hypothyroidism    PSVT  (paroxysmal supraventricular tachycardia) ([place])    Right bundle branch block     Past Surgical History:  Procedure Laterality Date   CATARACT EXTRACTION W/PHACO Left 05/23/2020   Procedure: CATARACT EXTRACTION PHACO AND INTRAOCULAR LENS PLACEMENT LEFT EYE;  Surgeon: Baruch Goldmann, MD;  Location: [facility];  Service: Ophthalmology;  Laterality: Left;  left CDE=15.69   CATARACT EXTRACTION W/PHACO Right 06/13/2020   Procedure: CATARACT EXTRACTION PHACO AND INTRAOCULAR LENS PLACEMENT RIGHT EYE;  Surgeon: Baruch Goldmann, MD;  Location: [facility];  Service: Ophthalmology;  Laterality: Right;  right CDE=11.33   LAPAROSCOPIC CHOLECYSTECTOMY  2010   THYROIDECTOMY  2009   TOTAL KNEE ARTHROPLASTY Left 02/16/2021   Procedure: TOTAL KNEE ARTHROPLASTY;  Surgeon: Gaynelle Arabian, MD;  Location: [facility];  Service: Orthopedics;  Laterality: Left;    Family History  Problem Relation Age of Onset   Stroke Mother    Thyroid disease Mother    Depression Father    Hypertension Sister    Aneurysm Sister    Diabetes Brother    Heart attack Brother    Hypertension Brother    Heart failure Brother    Thyroid disease Brother    Stroke Maternal Grandmother    Heart failure Maternal Grandmother    Heart failure Maternal Grandfather    Stroke Maternal Grandfather    Heart failure Paternal Grandmother     Social History  Socioeconomic History   Marital status: Widowed    Spouse name: Not on file   Number of children: 2   Years of education: 37   Highest education level: Not on file  Occupational History   Occupation: homemaker    Employer: RETIRED  Tobacco Use   Smoking status: Former    Packs/day: 0.50    Years: 10.00    Total pack years: 5.00    Types: Cigarettes    Quit date: 01/26/1983    Years since quitting: 38.7   Smokeless tobacco: Never   Tobacco comments:    quit 30 years ago  Vaping Use   Vaping Use: Never used  Substance and Sexual Activity   Alcohol use: No    Alcohol/week: 0.0  standard drinks of alcohol   Drug use: No   Sexual activity: Not Currently    Birth control/protection: Post-menopausal  Other Topics Concern   Not on file  Social History Narrative   Regular exercise: walk when able   Caffeine use: occasionally   Social Determinants of Health   Financial Resource Strain: Low Risk  (02/13/2021)   Overall Financial Resource Strain (CARDIA)    Difficulty of Paying Living Expenses: Not hard at all  Food Insecurity: No Food Insecurity (02/13/2021)   Hunger Vital Sign    Worried About Running Out of Food in the Last Year: Never true    Ran Out of Food in the Last Year: Never true  Transportation Needs: No Transportation Needs (02/13/2021)   PRAPARE - Hydrologist (Medical): No    Lack of Transportation (Non-Medical): No  Physical Activity: Inactive (02/13/2021)   Exercise Vital Sign    Days of Exercise per Week: 0 days    Minutes of Exercise per Session: 0 min  Stress: No Stress Concern Present (02/13/2021)   [place]    Feeling of Stress : Not at all  Social Connections: Moderately Isolated (02/13/2021)   Social Connection and Isolation Panel [NHANES]    Frequency of Communication with Friends and Family: More than three times a week    Frequency of Social Gatherings with Friends and Family: Never    Attends Religious Services: More than 4 times per year    Active Member of [employer] or Organizations: No    Attends Archivist Meetings: Never    Marital Status: Widowed  Intimate Partner Violence: Not At Risk (02/13/2021)   Humiliation, Afraid, Rape, and Kick questionnaire    Fear of Current or Ex-Partner: No    Emotionally Abused: No    Physically Abused: No    Sexually Abused: No    Outpatient Medications Prior to Visit  Medication Sig Dispense Refill   acetaminophen (TYLENOL) 500 MG tablet Take 1,000 mg by mouth every 6 (six) hours as needed for  moderate pain or headache.     albuterol (VENTOLIN HFA) 108 (90 Base) MCG/ACT inhaler Inhale 2 puffs into the lungs every 6 (six) hours as needed for wheezing or shortness of breath. 18 g 0   alendronate (FOSAMAX) 70 MG tablet TAKE 1 TABLET EVERY WEEK IN THE MORNING 30 MIN BEFORE EATING WITH AN 8OZ GLASS OF WATER (SIT UP 30 MIN) 4 tablet 2   B Complex-C (SUPER B COMPLEX PO) Take 1 tablet by mouth daily.     buPROPion (WELLBUTRIN XL) 300 MG 24 hr tablet Take 300 mg by mouth daily.      calcium carbonate (  TUMS - DOSED IN MG ELEMENTAL CALCIUM) 500 MG chewable tablet Chew 4 tablets by mouth daily.     Cholecalciferol (VITAMIN D3) 50 MCG (2000 UT) capsule Take 2,000 Units by mouth daily.     clonazePAM (KLONOPIN) 0.5 MG tablet Take 0.25-0.5 mg by mouth in the morning, at noon, and at bedtime.     DEXILANT 60 MG capsule Take 1 capsule (60 mg total) by mouth daily. 90 capsule 1   diltiazem (CARDIZEM CD) 240 MG 24 hr capsule TAKE (1) CAPSULE BY MOUTH ONCE DAILY. 90 capsule 3   DULoxetine (CYMBALTA) 60 MG capsule Take 60 mg by mouth 2 (two) times daily.      Lancets (ONETOUCH DELICA PLUS [id number]) MISC USE TO TEST BLOOD SUGAR TWICE DAILY. 100 each 0   levothyroxine (SYNTHROID) 125 MCG tablet Take 1 tablet (125 mcg total) by mouth daily. 90 tablet 3   losartan (COZAAR) 50 MG tablet TAKE (1) TABLET BY MOUTH TWICE DAILY 60 tablet 3   LYBALVI 5-10 MG TABS Take 1 tablet by mouth at bedtime.     metFORMIN (GLUCOPHAGE) 500 MG tablet Take 1 tablet (500 mg total) by mouth 2 (two) times daily with a meal. 60 tablet 0   Multiple Vitamins-Minerals (ICAPS AREDS 2 PO) Take 1 capsule by mouth 2 (two) times daily.     Multiple Vitamins-Minerals (PRESERVISION AREDS) TABS Take by mouth.     ondansetron (ZOFRAN) 4 MG tablet Take 1 tablet (4 mg total) by mouth every 8 (eight) hours as needed for nausea or vomiting. 20 tablet 0   ONETOUCH ULTRA test strip USE TO TEST BLOOD SUGAR TWICE DAILY. 50 each 0   pantoprazole  (PROTONIX) 40 MG tablet TAKE 1 TABLET 30 MINUTES BEFORE BREAKFAST. 90 tablet 1   saccharomyces boulardii (FLORASTOR) 250 MG capsule Take 250 mg by mouth daily.     Vitamin D-Vitamin K (VITAMIN K2-VITAMIN D3 PO) Take 1 tablet by mouth daily.     MYRBETRIQ 25 MG TB24 tablet TAKE (1) TABLET BY MOUTH ONCE DAILY. 30 tablet 3   atorvastatin (LIPITOR) 80 MG tablet Take 1 tablet (80 mg total) by mouth daily. 90 tablet 3   No facility-administered medications prior to visit.    Allergies  Allergen Reactions   Amoxicillin-Pot Clavulanate Rash   Atenolol Rash   Penicillins Rash    Tolerated Cephalosporin Date: 02/17/21.     Sulfonamide Derivatives Rash    ROS Review of Systems  Constitutional:  Positive for fatigue. Negative for chills and fever.  HENT:  Positive for ear pain (Right). Negative for congestion, sinus pressure and sinus pain.   Eyes:  Negative for pain and discharge.  Respiratory:  Negative for cough and shortness of breath.   Cardiovascular:  Negative for chest pain and palpitations.  Gastrointestinal:  Negative for diarrhea, nausea and vomiting.  Endocrine:       Excessive sweating  Genitourinary:  Negative for dysuria and hematuria.  Musculoskeletal:  Negative for neck pain and neck stiffness.  Skin:  Negative for rash.  Neurological:  Negative for dizziness and syncope.  Psychiatric/Behavioral:  Negative for agitation and behavioral problems.       Objective:    Physical Exam Vitals reviewed.  Constitutional:      General: She is not in acute distress.    Appearance: She is obese. She is not diaphoretic.  HENT:     Head: Normocephalic and atraumatic.     Right Ear: There is impacted cerumen.  Left Ear: There is impacted cerumen.     Nose: No congestion.     Mouth/Throat:     Mouth: Mucous membranes are moist.     Pharynx: No posterior oropharyngeal erythema.  Eyes:     General: No scleral icterus.    Extraocular Movements: Extraocular movements intact.   Cardiovascular:     Rate and Rhythm: Normal rate and regular rhythm.     Pulses: Normal pulses.     Heart sounds: Normal heart sounds. No murmur heard. Pulmonary:     Breath sounds: Normal breath sounds. No wheezing or rales.  Abdominal:     Palpations: Abdomen is soft.     Tenderness: There is no abdominal tenderness.  Musculoskeletal:     Cervical back: Neck supple. No tenderness.     Right lower leg: No edema.     Left lower leg: No edema.  Skin:    General: Skin is warm.     Findings: No rash.  Neurological:     General: No focal deficit present.     Mental Status: She is alert and oriented to person, place, and time.     Cranial Nerves: No cranial nerve deficit.     Sensory: No sensory deficit.     Motor: No weakness.  Psychiatric:        Mood and Affect: Mood normal.        Behavior: Behavior normal.     BP 132/62 (BP Location: Right Arm, Patient Position: Sitting, Cuff Size: Normal)   Pulse 69   Resp 18   Ht '5\' 2"'  (1.575 m)   Wt 188 lb 12.8 oz (85.6 kg)   SpO2 98%   BMI 34.53 kg/m  Wt Readings from Last 3 Encounters:  10/05/21 188 lb 12.8 oz (85.6 kg)  07/31/21 190 lb 9.6 oz (86.5 kg)  07/01/21 192 lb 6.4 oz (87.3 kg)    Lab Results  Component Value Date   TSH 1.390 10/05/2021   Lab Results  Component Value Date   WBC 9.7 05/26/2021   HGB 14.1 05/26/2021   HCT 40.9 05/26/2021   MCV 92 05/26/2021   PLT 456 (H) 05/26/2021   Lab Results  Component Value Date   NA 136 10/05/2021   K 5.1 10/05/2021   CO2 26 10/05/2021   GLUCOSE 88 10/05/2021   BUN 10 10/05/2021   CREATININE 0.73 10/05/2021   BILITOT 0.3 10/05/2021   ALKPHOS 91 10/05/2021   AST 19 10/05/2021   ALT 20 10/05/2021   PROT 6.9 10/05/2021   ALBUMIN 4.5 10/05/2021   CALCIUM 9.8 10/05/2021   ANIONGAP 7 07/31/2021   EGFR 86 10/05/2021   Lab Results  Component Value Date   CHOL 149 05/26/2021   Lab Results  Component Value Date   HDL 59 05/26/2021   Lab Results  Component  Value Date   LDLCALC 73 05/26/2021   Lab Results  Component Value Date   TRIG 94 05/26/2021   Lab Results  Component Value Date   CHOLHDL 2.5 05/26/2021   Lab Results  Component Value Date   HGBA1C 5.9 (H) 10/05/2021      Assessment & Plan:   Problem List Items Addressed This Visit       Cardiovascular and Mediastinum   Essential hypertension    BP Readings from Last 1 Encounters:  10/05/21 132/62  Well-controlled with Losartan 50 mg BID Counseled for compliance with the medications Advised DASH diet and moderate exercise/walking as tolerated  Endocrine   Postoperative hypothyroidism    Lab Results  Component Value Date   TSH 0.333 (L) 07/01/2021  Levothyroxine dose was decreased to 125 mcg QD recently Has sweating recently, check TSH and free T4      Relevant Orders   TSH+T4F+T3Free (Completed)   T3, reverse (Completed)   Thyroid peroxidase antibody (Completed)   Diabetes mellitus ([place])    Lab Results  Component Value Date   HGBA1C 6.7 (H) 05/26/2021  New onset On Metformin Advised to follow diabetic diet On ARB and statin F/u CMP and lipid panel Diabetic foot exam: Today Diabetic eye exam: Advised to follow up with Ophthalmology for diabetic eye exam      Relevant Orders   CMP14+EGFR (Completed)   Hemoglobin A1c (Completed)     Nervous and Auditory   Bilateral impacted cerumen    Bilateral ear irrigation done today, she tolerated it well        Other   Encounter for general adult medical examination with abnormal findings - Primary    Physical exam as documented. Counseling done  re healthy lifestyle involving commitment to 150 minutes exercise per week, heart healthy diet, and attaining healthy weight.The importance of adequate sleep also discussed. Changes in health habits are decided on by the patient with goals and time frames  set for achieving them. Immunization and cancer screening needs are specifically addressed at this  visit.      Relevant Orders   CMP14+EGFR (Completed)   Other Visit Diagnoses     Need for immunization against influenza       Relevant Orders   Flu Vaccine QUAD High Dose(Fluad) (Completed)       No orders of the defined types were placed in this encounter.   Follow-up: Return in about 3 months (around 01/04/2022) for DM and hypothyroidism.    Lindell Spar, MD

## 2021-10-05 NOTE — Assessment & Plan Note (Signed)
BP Readings from Last 1 Encounters:  10/05/21 132/62   Well-controlled with Losartan 50 mg BID Counseled for compliance with the medications Advised DASH diet and moderate exercise/walking as tolerated

## 2021-10-05 NOTE — Patient Instructions (Addendum)
Please apply Compound W for left toe callus.  Please continue taking medications as prescribed.  Please continue to follow low carb diet and ambulate as tolerated.  Please consider getting Shingrix second dose.

## 2021-10-05 NOTE — Assessment & Plan Note (Signed)
Lab Results  Component Value Date   TSH 0.333 (L) 07/01/2021   Levothyroxine dose was decreased to 125 mcg QD recently Has sweating recently, check TSH and free T4

## 2021-10-07 ENCOUNTER — Other Ambulatory Visit: Payer: Self-pay | Admitting: Family Medicine

## 2021-10-08 DIAGNOSIS — H353231 Exudative age-related macular degeneration, bilateral, with active choroidal neovascularization: Secondary | ICD-10-CM | POA: Diagnosis not present

## 2021-10-08 DIAGNOSIS — H26493 Other secondary cataract, bilateral: Secondary | ICD-10-CM | POA: Diagnosis not present

## 2021-10-08 LAB — CMP14+EGFR
ALT: 20 IU/L (ref 0–32)
AST: 19 IU/L (ref 0–40)
Albumin/Globulin Ratio: 1.9 (ref 1.2–2.2)
Albumin: 4.5 g/dL (ref 3.8–4.8)
Alkaline Phosphatase: 91 IU/L (ref 44–121)
BUN/Creatinine Ratio: 14 (ref 12–28)
BUN: 10 mg/dL (ref 8–27)
Bilirubin Total: 0.3 mg/dL (ref 0.0–1.2)
CO2: 26 mmol/L (ref 20–29)
Calcium: 9.8 mg/dL (ref 8.7–10.3)
Chloride: 95 mmol/L — ABNORMAL LOW (ref 96–106)
Creatinine, Ser: 0.73 mg/dL (ref 0.57–1.00)
Globulin, Total: 2.4 g/dL (ref 1.5–4.5)
Glucose: 88 mg/dL (ref 70–99)
Potassium: 5.1 mmol/L (ref 3.5–5.2)
Sodium: 136 mmol/L (ref 134–144)
Total Protein: 6.9 g/dL (ref 6.0–8.5)
eGFR: 86 mL/min/{1.73_m2} (ref >59)

## 2021-10-08 LAB — TSH+T4F+T3FREE
Free T4: 1.59 ng/dL (ref 0.82–1.77)
T3, Free: 2.5 pg/mL (ref 2.0–4.4)
TSH: 1.39 u[IU]/mL (ref 0.450–4.500)

## 2021-10-08 LAB — HEMOGLOBIN A1C
Est. average glucose Bld gHb Est-mCnc: 123 mg/dL
Hgb A1c MFr Bld: 5.9 % — ABNORMAL HIGH (ref 4.8–5.6)

## 2021-10-08 LAB — THYROID PEROXIDASE ANTIBODY: Thyroperoxidase Ab SerPl-aCnc: <9 IU/mL (ref 0–34)

## 2021-10-08 LAB — T3, REVERSE: Reverse T3, Serum: 19 ng/dL (ref 9.2–24.1)

## 2021-10-09 NOTE — Assessment & Plan Note (Signed)
Bilateral ear irrigation done today, she tolerated it well

## 2021-10-12 ENCOUNTER — Encounter: Payer: Self-pay | Admitting: Internal Medicine

## 2021-10-12 ENCOUNTER — Telehealth: Payer: Self-pay | Admitting: Internal Medicine

## 2021-10-12 DIAGNOSIS — H3530 Unspecified macular degeneration: Secondary | ICD-10-CM | POA: Insufficient documentation

## 2021-10-12 NOTE — Telephone Encounter (Signed)
Patient called in regard to excessive sweating , wonder if its related to thyroid meds being too high. Wants a call back in regard.    Also patient has deen diagnosed with macular degeneration

## 2021-10-13 ENCOUNTER — Other Ambulatory Visit: Payer: Self-pay | Admitting: Internal Medicine

## 2021-10-21 DIAGNOSIS — H43813 Vitreous degeneration, bilateral: Secondary | ICD-10-CM | POA: Diagnosis not present

## 2021-10-21 DIAGNOSIS — H353132 Nonexudative age-related macular degeneration, bilateral, intermediate dry stage: Secondary | ICD-10-CM | POA: Diagnosis not present

## 2021-10-21 LAB — HM DIABETES EYE EXAM

## 2021-10-22 ENCOUNTER — Ambulatory Visit: Payer: PPO | Admitting: Nutrition

## 2021-10-26 ENCOUNTER — Other Ambulatory Visit: Payer: Self-pay | Admitting: Family Medicine

## 2021-10-26 ENCOUNTER — Ambulatory Visit: Payer: PPO | Admitting: Nutrition

## 2021-11-02 ENCOUNTER — Other Ambulatory Visit: Payer: Self-pay | Admitting: Internal Medicine

## 2021-11-02 DIAGNOSIS — E1169 Type 2 diabetes mellitus with other specified complication: Secondary | ICD-10-CM

## 2021-11-06 ENCOUNTER — Encounter: Payer: Self-pay | Admitting: *Deleted

## 2021-11-12 DIAGNOSIS — M1711 Unilateral primary osteoarthritis, right knee: Secondary | ICD-10-CM | POA: Diagnosis not present

## 2021-11-16 ENCOUNTER — Other Ambulatory Visit: Payer: Self-pay | Admitting: Internal Medicine

## 2021-11-16 ENCOUNTER — Ambulatory Visit: Payer: PPO | Admitting: Nutrition

## 2021-12-01 ENCOUNTER — Ambulatory Visit: Payer: PPO | Admitting: Nutrition

## 2021-12-03 ENCOUNTER — Other Ambulatory Visit: Payer: Self-pay | Admitting: Internal Medicine

## 2021-12-03 DIAGNOSIS — E1169 Type 2 diabetes mellitus with other specified complication: Secondary | ICD-10-CM

## 2021-12-12 ENCOUNTER — Other Ambulatory Visit: Payer: Self-pay | Admitting: Internal Medicine

## 2021-12-12 ENCOUNTER — Other Ambulatory Visit: Payer: Self-pay | Admitting: Physician Assistant

## 2021-12-12 DIAGNOSIS — I10 Essential (primary) hypertension: Secondary | ICD-10-CM

## 2021-12-14 ENCOUNTER — Other Ambulatory Visit: Payer: Self-pay | Admitting: Internal Medicine

## 2021-12-30 DIAGNOSIS — M1711 Unilateral primary osteoarthritis, right knee: Secondary | ICD-10-CM | POA: Diagnosis not present

## 2021-12-31 ENCOUNTER — Encounter: Payer: PPO | Admitting: Nutrition

## 2022-01-01 ENCOUNTER — Other Ambulatory Visit: Payer: Self-pay | Admitting: Internal Medicine

## 2022-01-01 DIAGNOSIS — E1169 Type 2 diabetes mellitus with other specified complication: Secondary | ICD-10-CM

## 2022-01-06 ENCOUNTER — Other Ambulatory Visit: Payer: Self-pay | Admitting: Family Medicine

## 2022-01-06 ENCOUNTER — Ambulatory Visit: Payer: PPO | Admitting: Internal Medicine

## 2022-01-06 DIAGNOSIS — M1711 Unilateral primary osteoarthritis, right knee: Secondary | ICD-10-CM | POA: Diagnosis not present

## 2022-01-06 DIAGNOSIS — M25561 Pain in right knee: Secondary | ICD-10-CM | POA: Diagnosis not present

## 2022-01-08 ENCOUNTER — Other Ambulatory Visit: Payer: Self-pay | Admitting: Internal Medicine

## 2022-01-08 DIAGNOSIS — I10 Essential (primary) hypertension: Secondary | ICD-10-CM

## 2022-01-12 ENCOUNTER — Other Ambulatory Visit: Payer: Self-pay | Admitting: Internal Medicine

## 2022-01-13 DIAGNOSIS — M1711 Unilateral primary osteoarthritis, right knee: Secondary | ICD-10-CM | POA: Diagnosis not present

## 2022-01-13 DIAGNOSIS — M25561 Pain in right knee: Secondary | ICD-10-CM | POA: Diagnosis not present

## 2022-01-13 DIAGNOSIS — F3181 Bipolar II disorder: Secondary | ICD-10-CM | POA: Diagnosis not present

## 2022-01-19 DIAGNOSIS — H04123 Dry eye syndrome of bilateral lacrimal glands: Secondary | ICD-10-CM | POA: Diagnosis not present

## 2022-01-22 ENCOUNTER — Encounter: Payer: Self-pay | Admitting: Internal Medicine

## 2022-01-22 ENCOUNTER — Ambulatory Visit (INDEPENDENT_AMBULATORY_CARE_PROVIDER_SITE_OTHER): Payer: PPO | Admitting: Internal Medicine

## 2022-01-22 VITALS — BP 137/79 | HR 92 | Ht 62.0 in | Wt 189.2 lb

## 2022-01-22 DIAGNOSIS — J209 Acute bronchitis, unspecified: Secondary | ICD-10-CM | POA: Insufficient documentation

## 2022-01-22 MED ORDER — METHYLPREDNISOLONE 4 MG PO TBPK: ORAL_TABLET | ORAL | 0 refills | Status: DC

## 2022-01-22 MED ORDER — BENZONATATE 100 MG PO CAPS: 100.0000 mg | ORAL_CAPSULE | Freq: Two times a day (BID) | ORAL | 0 refills | Status: DC | PRN

## 2022-01-22 NOTE — Patient Instructions (Signed)
Please take Prednisone as prescribed.  Please take Benzonatate as needed for cough. Okay to take Delsym for cough as well.

## 2022-01-22 NOTE — Progress Notes (Signed)
Acute Office Visit  Subjective:    Patient ID: [name], female    DOB: 07-11-1945, 76 y.o.   MRN: [id number]  Chief Complaint  Patient presents with   Cough    Patient has been coughing up a bunch of mucus, she has been taking OTC for over week now. She is having lack of energy along with over acting sweats    HPI Patient is in today for cough with clear expectoration, fatigue and excessive sweating for the last 2 weeks.  She also complains of chest congestion.  She denies any fever or chills.  She has tried taking Delsym with some relief.  She has not had flu or COVID testing.  Her symptoms have started improving, but still has cough and mild fatigue.  Denies any hemoptysis or LAD.  Past Medical History:  Diagnosis Date   Allergic rhinitis    Arthritis    B12 deficiency    Bipolar disorder ([place])    Coronary artery disease    Depression    Diabetes mellitus ([place])    Diabetes mellitus ([place]) 10/05/2021   Essential hypertension    GERD (gastroesophageal reflux disease)    Hashimoto's thyroiditis    History of transient ischemic attack (TIA)    Or possibly migraine as well as right amaurosis fugax and ataxia - Dr. Erling Cruz   Hyperlipidemia    Hypothyroidism    PSVT (paroxysmal supraventricular tachycardia)    Right bundle branch block     Past Surgical History:  Procedure Laterality Date   CATARACT EXTRACTION W/PHACO Left 05/23/2020   Procedure: CATARACT EXTRACTION PHACO AND INTRAOCULAR LENS PLACEMENT LEFT EYE;  Surgeon: Baruch Goldmann, MD;  Location: [facility];  Service: Ophthalmology;  Laterality: Left;  left CDE=15.69   CATARACT EXTRACTION W/PHACO Right 06/13/2020   Procedure: CATARACT EXTRACTION PHACO AND INTRAOCULAR LENS PLACEMENT RIGHT EYE;  Surgeon: Baruch Goldmann, MD;  Location: [facility];  Service: Ophthalmology;  Laterality: Right;  right CDE=11.33   LAPAROSCOPIC CHOLECYSTECTOMY  2010   THYROIDECTOMY  2009   TOTAL KNEE ARTHROPLASTY Left 02/16/2021   Procedure: TOTAL  KNEE ARTHROPLASTY;  Surgeon: Gaynelle Arabian, MD;  Location: [facility];  Service: Orthopedics;  Laterality: Left;    Family History  Problem Relation Age of Onset   Stroke Mother    Thyroid disease Mother    Depression Father    Hypertension Sister    Aneurysm Sister    Diabetes Brother    Heart attack Brother    Hypertension Brother    Heart failure Brother    Thyroid disease Brother    Stroke Maternal Grandmother    Heart failure Maternal Grandmother    Heart failure Maternal Grandfather    Stroke Maternal Grandfather    Heart failure Paternal Grandmother     Social History   Socioeconomic History   Marital status: Widowed    Spouse name: Not on file   Number of children: 2   Years of education: 45   Highest education level: Not on file  Occupational History   Occupation: homemaker    Employer: RETIRED  Tobacco Use   Smoking status: Former    Packs/day: 0.50    Years: 10.00    Total pack years: 5.00    Types: Cigarettes    Quit date: 01/26/1983    Years since quitting: 39.0   Smokeless tobacco: Never   Tobacco comments:    quit 30 years ago  Vaping Use   Vaping Use: Never used  Substance  and Sexual Activity   Alcohol use: No    Alcohol/week: 0.0 standard drinks of alcohol   Drug use: No   Sexual activity: Not Currently    Birth control/protection: Post-menopausal  Other Topics Concern   Not on file  Social History Narrative   Regular exercise: walk when able   Caffeine use: occasionally   Social Determinants of Health   Financial Resource Strain: Low Risk  (02/13/2021)   Overall Financial Resource Strain (CARDIA)    Difficulty of Paying Living Expenses: Not hard at all  Food Insecurity: No Food Insecurity (02/13/2021)   Hunger Vital Sign    Worried About Running Out of Food in the Last Year: Never true    Ran Out of Food in the Last Year: Never true  Transportation Needs: No Transportation Needs (02/13/2021)   PRAPARE - Radiographer, therapeutic (Medical): No    Lack of Transportation (Non-Medical): No  Physical Activity: Inactive (02/13/2021)   Exercise Vital Sign    Days of Exercise per Week: 0 days    Minutes of Exercise per Session: 0 min  Stress: No Stress Concern Present (02/13/2021)   [place]    Feeling of Stress : Not at all  Social Connections: Moderately Isolated (02/13/2021)   Social Connection and Isolation Panel [NHANES]    Frequency of Communication with Friends and Family: More than three times a week    Frequency of Social Gatherings with Friends and Family: Never    Attends Religious Services: More than 4 times per year    Active Member of [employer] or Organizations: No    Attends Archivist Meetings: Never    Marital Status: Widowed  Intimate Partner Violence: Not At Risk (02/13/2021)   Humiliation, Afraid, Rape, and Kick questionnaire    Fear of Current or Ex-Partner: No    Emotionally Abused: No    Physically Abused: No    Sexually Abused: No    Outpatient Medications Prior to Visit  Medication Sig Dispense Refill   topiramate (TOPAMAX) 50 MG tablet Take 50 mg by mouth at bedtime.     acetaminophen (TYLENOL) 500 MG tablet Take 1,000 mg by mouth every 6 (six) hours as needed for moderate pain or headache.     albuterol (VENTOLIN HFA) 108 (90 Base) MCG/ACT inhaler Inhale 2 puffs into the lungs every 6 (six) hours as needed for wheezing or shortness of breath. 18 g 0   alendronate (FOSAMAX) 70 MG tablet TAKE 1 TABLET EVERY WEEK IN THE MORNING 30 MIN BEFORE EATING WITH AN 8OZ GLASS OF WATER (SIT UP 30 MIN) 4 tablet 0   atorvastatin (LIPITOR) 80 MG tablet TAKE 1 TABLET BY MOUTH ONCE A DAY. 90 tablet 1   B Complex-C (SUPER B COMPLEX PO) Take 1 tablet by mouth daily.     buPROPion (WELLBUTRIN XL) 300 MG 24 hr tablet Take 300 mg by mouth daily.      calcium carbonate (TUMS - DOSED IN MG ELEMENTAL CALCIUM) 500 MG chewable tablet  Chew 4 tablets by mouth daily.     Cholecalciferol (VITAMIN D3) 50 MCG (2000 UT) capsule Take 2,000 Units by mouth daily.     clonazePAM (KLONOPIN) 0.5 MG tablet Take 0.25-0.5 mg by mouth in the morning, at noon, and at bedtime.     DEXILANT 60 MG capsule Take 1 capsule (60 mg total) by mouth daily. 30 capsule 0   diltiazem (CARDIZEM CD) 240  MG 24 hr capsule TAKE (1) CAPSULE BY MOUTH ONCE DAILY. 90 capsule 3   DULoxetine (CYMBALTA) 60 MG capsule Take 60 mg by mouth 2 (two) times daily.      Lancets (ONETOUCH DELICA PLUS [id number]) MISC USE TO TEST BLOOD SUGAR TWICE DAILY. 100 each 0   levothyroxine (SYNTHROID) 125 MCG tablet Take 1 tablet (125 mcg total) by mouth daily. 90 tablet 3   losartan (COZAAR) 50 MG tablet TAKE (1) TABLET BY MOUTH TWICE DAILY 60 tablet 0   LYBALVI 5-10 MG TABS Take 1 tablet by mouth at bedtime.     metFORMIN (GLUCOPHAGE) 500 MG tablet Take 1 tablet (500 mg total) by mouth 2 (two) times daily with a meal. 60 tablet 0   methocarbamol (ROBAXIN) 500 MG tablet Take 1 tablet 3 times a day by oral route as needed.     Multiple Vitamins-Minerals (ICAPS AREDS 2 PO) Take 1 capsule by mouth 2 (two) times daily.     Multiple Vitamins-Minerals (PRESERVISION AREDS) TABS Take by mouth.     MYRBETRIQ 25 MG TB24 tablet TAKE (1) TABLET BY MOUTH ONCE DAILY. 30 tablet 0   ondansetron (ZOFRAN) 4 MG tablet Take 1 tablet (4 mg total) by mouth every 8 (eight) hours as needed for nausea or vomiting. 20 tablet 0   ONETOUCH ULTRA test strip USE TO TEST BLOOD SUGAR TWICE DAILY. 50 each 0   pantoprazole (PROTONIX) 40 MG tablet TAKE 1 TABLET 30 MINUTES BEFORE BREAKFAST. 90 tablet 1   saccharomyces boulardii (FLORASTOR) 250 MG capsule Take 250 mg by mouth daily.     Vitamin D-Vitamin K (VITAMIN K2-VITAMIN D3 PO) Take 1 tablet by mouth daily.     No facility-administered medications prior to visit.    Allergies  Allergen Reactions   Amoxicillin-Pot Clavulanate Rash   Atenolol Rash   Penicillins  Rash    Tolerated Cephalosporin Date: 02/17/21.     Sulfonamide Derivatives Rash    Review of Systems  Constitutional:  Positive for fatigue. Negative for chills and fever.  HENT:  Negative for congestion, sinus pressure and sinus pain.   Eyes:  Negative for pain and discharge.  Respiratory:  Negative for cough and shortness of breath.   Cardiovascular:  Negative for chest pain and palpitations.  Gastrointestinal:  Negative for diarrhea, nausea and vomiting.  Endocrine:       Excessive sweating  Genitourinary:  Negative for dysuria and hematuria.  Musculoskeletal:  Negative for neck pain and neck stiffness.  Skin:  Negative for rash.  Neurological:  Negative for dizziness and syncope.  Psychiatric/Behavioral:  Negative for agitation and behavioral problems.        Objective:    Physical Exam Vitals reviewed.  Constitutional:      General: She is not in acute distress.    Appearance: She is obese. She is not diaphoretic.  HENT:     Head: Normocephalic and atraumatic.     Nose: No congestion.     Mouth/Throat:     Mouth: Mucous membranes are moist.     Pharynx: No posterior oropharyngeal erythema.  Eyes:     General: No scleral icterus.    Extraocular Movements: Extraocular movements intact.  Cardiovascular:     Rate and Rhythm: Normal rate and regular rhythm.     Pulses: Normal pulses.     Heart sounds: Normal heart sounds. No murmur heard. Pulmonary:     Breath sounds: Rhonchi (B/l) present. No wheezing.  Abdominal:     Palpations:  Abdomen is soft.     Tenderness: There is no abdominal tenderness.  Musculoskeletal:     Cervical back: Neck supple. No tenderness.     Right lower leg: No edema.     Left lower leg: No edema.  Skin:    General: Skin is warm.     Findings: No rash.  Neurological:     General: No focal deficit present.     Mental Status: She is alert and oriented to person, place, and time.     Cranial Nerves: No cranial nerve deficit.      Sensory: No sensory deficit.     Motor: No weakness.  Psychiatric:        Mood and Affect: Mood normal.        Behavior: Behavior normal.     BP 137/79 (BP Location: Left Arm, Patient Position: Sitting, Cuff Size: Large)   Pulse 92   Ht '5\' 2"'$  (1.575 m)   Wt 189 lb 3.2 oz (85.8 kg)   SpO2 92%   BMI 34.61 kg/m  Wt Readings from Last 3 Encounters:  01/22/22 189 lb 3.2 oz (85.8 kg)  10/05/21 188 lb 12.8 oz (85.6 kg)  07/31/21 190 lb 9.6 oz (86.5 kg)        Assessment & Plan:   Problem List Items Addressed This Visit       Respiratory   Acute bronchitis - Primary    Has persistent cough and chest congestion, likely had recent viral URTI leading to acute bronchitis now Started Medrol Dosepak Tessalon as needed for cough Delsym as needed for cough If persistent symptoms, will start antibiotic      Relevant Medications   methylPREDNISolone (MEDROL DOSEPAK) 4 MG TBPK tablet   benzonatate (TESSALON) 100 MG capsule     Meds ordered this encounter  Medications   methylPREDNISolone (MEDROL DOSEPAK) 4 MG TBPK tablet    Sig: Take as package instructions.    Dispense:  1 each    Refill:  0   benzonatate (TESSALON) 100 MG capsule    Sig: Take 1 capsule (100 mg total) by mouth 2 (two) times daily as needed for cough.    Dispense:  20 capsule    Refill:  0     [name] Keith Rake, MD

## 2022-01-22 NOTE — Assessment & Plan Note (Signed)
Has persistent cough and chest congestion, likely had recent viral URTI leading to acute bronchitis now Started Medrol Dosepak Tessalon as needed for cough Delsym as needed for cough If persistent symptoms, will start antibiotic

## 2022-02-01 ENCOUNTER — Encounter: Payer: Self-pay | Admitting: Internal Medicine

## 2022-02-01 ENCOUNTER — Ambulatory Visit (INDEPENDENT_AMBULATORY_CARE_PROVIDER_SITE_OTHER): Payer: PPO | Admitting: Internal Medicine

## 2022-02-01 VITALS — BP 126/66 | HR 75 | Ht 62.0 in | Wt 185.6 lb

## 2022-02-01 DIAGNOSIS — E890 Postprocedural hypothyroidism: Secondary | ICD-10-CM | POA: Diagnosis not present

## 2022-02-01 DIAGNOSIS — R61 Generalized hyperhidrosis: Secondary | ICD-10-CM

## 2022-02-01 DIAGNOSIS — E782 Mixed hyperlipidemia: Secondary | ICD-10-CM

## 2022-02-01 DIAGNOSIS — N3941 Urge incontinence: Secondary | ICD-10-CM | POA: Diagnosis not present

## 2022-02-01 DIAGNOSIS — F3170 Bipolar disorder, currently in remission, most recent episode unspecified: Secondary | ICD-10-CM

## 2022-02-01 DIAGNOSIS — M810 Age-related osteoporosis without current pathological fracture: Secondary | ICD-10-CM | POA: Diagnosis not present

## 2022-02-01 DIAGNOSIS — I471 Supraventricular tachycardia, unspecified: Secondary | ICD-10-CM

## 2022-02-01 DIAGNOSIS — I10 Essential (primary) hypertension: Secondary | ICD-10-CM | POA: Diagnosis not present

## 2022-02-01 DIAGNOSIS — E1169 Type 2 diabetes mellitus with other specified complication: Secondary | ICD-10-CM

## 2022-02-01 MED ORDER — MIRABEGRON ER 25 MG PO TB24: 25.0000 mg | ORAL_TABLET | Freq: Every day | ORAL | 5 refills | Status: DC

## 2022-02-01 MED ORDER — ALENDRONATE SODIUM 70 MG PO TABS: ORAL_TABLET | ORAL | 11 refills | Status: AC

## 2022-02-01 MED ORDER — BLOOD GLUCOSE MONITOR KIT: PACK | 0 refills | Status: AC

## 2022-02-01 NOTE — Assessment & Plan Note (Signed)
On Alendronate

## 2022-02-01 NOTE — Assessment & Plan Note (Addendum)
Lab Results  Component Value Date   HGBA1C 5.9 (H) 10/05/2021   Associated with HTN and HLD Well-controlled On Metformin Advised to follow diabetic diet On ARB and statin F/u CMP and lipid panel Diabetic eye exam: Advised to follow up with Ophthalmology for diabetic eye exam

## 2022-02-01 NOTE — Assessment & Plan Note (Signed)
Lab Results  Component Value Date   TSH 1.390 10/05/2021   On Levothyroxine 125 mcg QD recently Has sweating recently, check TSH and free T4

## 2022-02-01 NOTE — Patient Instructions (Addendum)
Please continue taking medications as prescribed.  Please continue to follow low carb diet and ambulate as tolerated. 

## 2022-02-01 NOTE — Assessment & Plan Note (Addendum)
Well-controlled with Myrbetriq

## 2022-02-01 NOTE — Assessment & Plan Note (Signed)
On atorvastatin 80 mg QD

## 2022-02-01 NOTE — Assessment & Plan Note (Signed)
Well controlled with diltiazem Followed by Cardiology

## 2022-02-01 NOTE — Assessment & Plan Note (Addendum)
BP Readings from Last 1 Encounters:  02/01/22 126/66   Well-controlled with Losartan 50 mg BID Counseled for compliance with the medications Advised DASH diet and moderate exercise/walking as tolerated

## 2022-02-01 NOTE — Progress Notes (Unsigned)
Established Patient Office Visit  Subjective:  Patient ID: [name], female    DOB: 07/12/1945  Age: 77 y.o. MRN: [id number]  CC:  Chief Complaint  Patient presents with   Diabetes    Patient is here for a diabetic follow up, she would also like for her thyroid levels to be checked    HPI [name] is a 77 y.o. female with past medical history of HTN, PSVT, GERD, hypothyroidism, HLD, mood disorder, OA and obesity who presents for f/u of her chronic medical conditions.  Hypothyroidism: She has been taking levothyroxine regularly.  Her dose of levothyroxine was adjusted to 125 mcg daily as her TSH was low about 4 months ago.  She has been having excessive sweating for the last few weeks.  She reports chronic fatigue as well, which could be related to uncontrolled hypothyroidism.  Denies any recent change in appetite or weight.  Denies any tremors or palpitations currently.  Denies any fever, chills or LAD.  HTN: BP is well-controlled. Takes medications regularly. Patient denies headache, dizziness, chest pain, dyspnea or palpitations.  Type II DM: Her last HbA1c was 5.9.  She is on metformin and has been tolerating it well.  Denies any polyuria or polyphagia currently.   Past Medical History:  Diagnosis Date   Allergic rhinitis    Arthritis    B12 deficiency    Bipolar disorder ([place])    Coronary artery disease    Depression    Diabetes mellitus ([place])    Diabetes mellitus ([place]) 10/05/2021   Essential hypertension    GERD (gastroesophageal reflux disease)    Hashimoto's thyroiditis    History of transient ischemic attack (TIA)    Or possibly migraine as well as right amaurosis fugax and ataxia - Dr. Erling Cruz   Hyperlipidemia    Hypothyroidism    PSVT (paroxysmal supraventricular tachycardia)    Right bundle branch block     Past Surgical History:  Procedure Laterality Date   CATARACT EXTRACTION W/PHACO Left 05/23/2020   Procedure: CATARACT EXTRACTION PHACO AND  INTRAOCULAR LENS PLACEMENT LEFT EYE;  Surgeon: Baruch Goldmann, MD;  Location: [facility];  Service: Ophthalmology;  Laterality: Left;  left CDE=15.69   CATARACT EXTRACTION W/PHACO Right 06/13/2020   Procedure: CATARACT EXTRACTION PHACO AND INTRAOCULAR LENS PLACEMENT RIGHT EYE;  Surgeon: Baruch Goldmann, MD;  Location: [facility];  Service: Ophthalmology;  Laterality: Right;  right CDE=11.33   LAPAROSCOPIC CHOLECYSTECTOMY  2010   THYROIDECTOMY  2009   TOTAL KNEE ARTHROPLASTY Left 02/16/2021   Procedure: TOTAL KNEE ARTHROPLASTY;  Surgeon: Gaynelle Arabian, MD;  Location: [facility];  Service: Orthopedics;  Laterality: Left;    Family History  Problem Relation Age of Onset   Stroke Mother    Thyroid disease Mother    Depression Father    Hypertension Sister    Aneurysm Sister    Diabetes Brother    Heart attack Brother    Hypertension Brother    Heart failure Brother    Thyroid disease Brother    Stroke Maternal Grandmother    Heart failure Maternal Grandmother    Heart failure Maternal Grandfather    Stroke Maternal Grandfather    Heart failure Paternal Grandmother     Social History   Socioeconomic History   Marital status: Widowed    Spouse name: Not on file   Number of children: 2   Years of education: 18   Highest education level: Not on file  Occupational History   Occupation:  homemaker    Employer: RETIRED  Tobacco Use   Smoking status: Former    Packs/day: 0.50    Years: 10.00    Total pack years: 5.00    Types: Cigarettes    Quit date: 01/26/1983    Years since quitting: 39.0   Smokeless tobacco: Never   Tobacco comments:    quit 30 years ago  Vaping Use   Vaping Use: Never used  Substance and Sexual Activity   Alcohol use: No    Alcohol/week: 0.0 standard drinks of alcohol   Drug use: No   Sexual activity: Not Currently    Birth control/protection: Post-menopausal  Other Topics Concern   Not on file  Social History Narrative   Regular exercise: walk when able    Caffeine use: occasionally   Social Determinants of Health   Financial Resource Strain: Low Risk  (02/13/2021)   Overall Financial Resource Strain (CARDIA)    Difficulty of Paying Living Expenses: Not hard at all  Food Insecurity: No Food Insecurity (02/13/2021)   Hunger Vital Sign    Worried About Running Out of Food in the Last Year: Never true    Ran Out of Food in the Last Year: Never true  Transportation Needs: No Transportation Needs (02/13/2021)   PRAPARE - Hydrologist (Medical): No    Lack of Transportation (Non-Medical): No  Physical Activity: Inactive (02/13/2021)   Exercise Vital Sign    Days of Exercise per Week: 0 days    Minutes of Exercise per Session: 0 min  Stress: No Stress Concern Present (02/13/2021)   [place]    Feeling of Stress : Not at all  Social Connections: Moderately Isolated (02/13/2021)   Social Connection and Isolation Panel [NHANES]    Frequency of Communication with Friends and Family: More than three times a week    Frequency of Social Gatherings with Friends and Family: Never    Attends Religious Services: More than 4 times per year    Active Member of [employer] or Organizations: No    Attends Archivist Meetings: Never    Marital Status: Widowed  Intimate Partner Violence: Not At Risk (02/13/2021)   Humiliation, Afraid, Rape, and Kick questionnaire    Fear of Current or Ex-Partner: No    Emotionally Abused: No    Physically Abused: No    Sexually Abused: No    Outpatient Medications Prior to Visit  Medication Sig Dispense Refill   acetaminophen (TYLENOL) 500 MG tablet Take 1,000 mg by mouth every 6 (six) hours as needed for moderate pain or headache.     albuterol (VENTOLIN HFA) 108 (90 Base) MCG/ACT inhaler Inhale 2 puffs into the lungs every 6 (six) hours as needed for wheezing or shortness of breath. 18 g 0   atorvastatin (LIPITOR) 80 MG  tablet TAKE 1 TABLET BY MOUTH ONCE A DAY. 90 tablet 1   B Complex-C (SUPER B COMPLEX PO) Take 1 tablet by mouth daily.     buPROPion (WELLBUTRIN XL) 300 MG 24 hr tablet Take 300 mg by mouth daily.      calcium carbonate (TUMS - DOSED IN MG ELEMENTAL CALCIUM) 500 MG chewable tablet Chew 4 tablets by mouth daily.     Cholecalciferol (VITAMIN D3) 50 MCG (2000 UT) capsule Take 2,000 Units by mouth daily.     clonazePAM (KLONOPIN) 0.5 MG tablet Take 0.25-0.5 mg by mouth in the morning, at noon, and  at bedtime.     DEXILANT 60 MG capsule Take 1 capsule (60 mg total) by mouth daily. 30 capsule 0   diltiazem (CARDIZEM CD) 240 MG 24 hr capsule TAKE (1) CAPSULE BY MOUTH ONCE DAILY. 90 capsule 3   DULoxetine (CYMBALTA) 60 MG capsule Take 60 mg by mouth 2 (two) times daily.      Lancets (ONETOUCH DELICA PLUS [id number]) MISC USE TO TEST BLOOD SUGAR TWICE DAILY. 100 each 0   levothyroxine (SYNTHROID) 125 MCG tablet Take 1 tablet (125 mcg total) by mouth daily. 90 tablet 3   losartan (COZAAR) 50 MG tablet TAKE (1) TABLET BY MOUTH TWICE DAILY 60 tablet 0   LYBALVI 5-10 MG TABS Take 1 tablet by mouth at bedtime.     metFORMIN (GLUCOPHAGE) 500 MG tablet Take 1 tablet (500 mg total) by mouth 2 (two) times daily with a meal. 60 tablet 0   methocarbamol (ROBAXIN) 500 MG tablet Take 1 tablet 3 times a day by oral route as needed.     Multiple Vitamins-Minerals (ICAPS AREDS 2 PO) Take 1 capsule by mouth 2 (two) times daily.     Multiple Vitamins-Minerals (PRESERVISION AREDS) TABS Take by mouth.     ondansetron (ZOFRAN) 4 MG tablet Take 1 tablet (4 mg total) by mouth every 8 (eight) hours as needed for nausea or vomiting. 20 tablet 0   ONETOUCH ULTRA test strip USE TO TEST BLOOD SUGAR TWICE DAILY. 50 each 0   pantoprazole (PROTONIX) 40 MG tablet TAKE 1 TABLET 30 MINUTES BEFORE BREAKFAST. 90 tablet 1   saccharomyces boulardii (FLORASTOR) 250 MG capsule Take 250 mg by mouth daily.     topiramate (TOPAMAX) 50 MG tablet  Take 50 mg by mouth at bedtime.     Vitamin D-Vitamin K (VITAMIN K2-VITAMIN D3 PO) Take 1 tablet by mouth daily.     alendronate (FOSAMAX) 70 MG tablet TAKE 1 TABLET EVERY WEEK IN THE MORNING 30 MIN BEFORE EATING WITH AN 8OZ GLASS OF WATER (SIT UP 30 MIN) 4 tablet 0   benzonatate (TESSALON) 100 MG capsule Take 1 capsule (100 mg total) by mouth 2 (two) times daily as needed for cough. 20 capsule 0   methylPREDNISolone (MEDROL DOSEPAK) 4 MG TBPK tablet Take as package instructions. 1 each 0   MYRBETRIQ 25 MG TB24 tablet TAKE (1) TABLET BY MOUTH ONCE DAILY. 30 tablet 0   No facility-administered medications prior to visit.    Allergies  Allergen Reactions   Amoxicillin-Pot Clavulanate Rash   Atenolol Rash   Penicillins Rash    Tolerated Cephalosporin Date: 02/17/21.     Sulfonamide Derivatives Rash    ROS Review of Systems  Constitutional:  Positive for fatigue. Negative for chills and fever.  HENT:  Negative for congestion, sinus pressure and sinus pain.   Eyes:  Negative for pain and discharge.  Respiratory:  Negative for cough and shortness of breath.   Cardiovascular:  Negative for chest pain and palpitations.  Gastrointestinal:  Negative for diarrhea, nausea and vomiting.  Genitourinary:  Negative for dysuria and hematuria.  Musculoskeletal:  Negative for neck pain and neck stiffness.  Skin:  Negative for rash.  Neurological:  Negative for dizziness and syncope.  Psychiatric/Behavioral:  Negative for agitation and behavioral problems.       Objective:    Physical Exam Vitals reviewed.  Constitutional:      General: She is not in acute distress.    Appearance: She is obese. She is not diaphoretic.  HENT:  Head: Normocephalic and atraumatic.     Right Ear: There is no impacted cerumen.     Left Ear: There is no impacted cerumen.     Nose: No congestion.     Mouth/Throat:     Mouth: Mucous membranes are moist.     Pharynx: No posterior oropharyngeal erythema.   Eyes:     General: No scleral icterus.    Extraocular Movements: Extraocular movements intact.  Cardiovascular:     Rate and Rhythm: Normal rate and regular rhythm.     Pulses: Normal pulses.     Heart sounds: Normal heart sounds. No murmur heard. Pulmonary:     Breath sounds: Normal breath sounds. No wheezing or rales.  Musculoskeletal:     Cervical back: Neck supple. No tenderness.     Right lower leg: No edema.     Left lower leg: No edema.  Skin:    General: Skin is warm.     Findings: No rash.  Neurological:     General: No focal deficit present.     Mental Status: She is alert and oriented to person, place, and time.  Psychiatric:        Mood and Affect: Mood normal.        Behavior: Behavior normal.     BP 126/66 (BP Location: Right Arm, Cuff Size: Normal)   Pulse 75   Ht '5\' 2"'$  (1.575 m)   Wt 185 lb 9.6 oz (84.2 kg)   SpO2 94%   BMI 33.95 kg/m  Wt Readings from Last 3 Encounters:  02/01/22 185 lb 9.6 oz (84.2 kg)  01/22/22 189 lb 3.2 oz (85.8 kg)  10/05/21 188 lb 12.8 oz (85.6 kg)    Lab Results  Component Value Date   TSH 1.390 10/05/2021   Lab Results  Component Value Date   WBC 9.7 05/26/2021   HGB 14.1 05/26/2021   HCT 40.9 05/26/2021   MCV 92 05/26/2021   PLT 456 (H) 05/26/2021   Lab Results  Component Value Date   NA 136 10/05/2021   K 5.1 10/05/2021   CO2 26 10/05/2021   GLUCOSE 88 10/05/2021   BUN 10 10/05/2021   CREATININE 0.73 10/05/2021   BILITOT 0.3 10/05/2021   ALKPHOS 91 10/05/2021   AST 19 10/05/2021   ALT 20 10/05/2021   PROT 6.9 10/05/2021   ALBUMIN 4.5 10/05/2021   CALCIUM 9.8 10/05/2021   ANIONGAP 7 07/31/2021   EGFR 86 10/05/2021   Lab Results  Component Value Date   CHOL 149 05/26/2021   Lab Results  Component Value Date   HDL 59 05/26/2021   Lab Results  Component Value Date   LDLCALC 73 05/26/2021   Lab Results  Component Value Date   TRIG 94 05/26/2021   Lab Results  Component Value Date   CHOLHDL  2.5 05/26/2021   Lab Results  Component Value Date   HGBA1C 5.9 (H) 10/05/2021      Assessment & Plan:   Problem List Items Addressed This Visit       Cardiovascular and Mediastinum   Essential hypertension    BP Readings from Last 1 Encounters:  02/01/22 126/66  Well-controlled with Losartan 50 mg BID Counseled for compliance with the medications Advised DASH diet and moderate exercise/walking as tolerated      PSVT (paroxysmal supraventricular tachycardia)    Well controlled with diltiazem Followed by Cardiology        Endocrine   Postoperative hypothyroidism    Lab  Results  Component Value Date   TSH 1.390 10/05/2021  On Levothyroxine 125 mcg QD recently Has sweating recently, check TSH and free T4      Relevant Orders   TSH + free T4   Diabetes mellitus ([place]) - Primary    Lab Results  Component Value Date   HGBA1C 5.9 (H) 10/05/2021  Associated with HTN and HLD Well-controlled On Metformin Advised to follow diabetic diet On ARB and statin F/u CMP and lipid panel Diabetic eye exam: Advised to follow up with Ophthalmology for diabetic eye exam      Relevant Medications   blood glucose meter kit and supplies KIT   Other Relevant Orders   CMP14+EGFR   Hemoglobin A1c     Musculoskeletal and Integument   Age-related osteoporosis without current pathological fracture    On Alendronate      Relevant Medications   alendronate (FOSAMAX) 70 MG tablet     Other   Hyperlipidemia    On atorvastatin 80 mg QD      Urge incontinence of urine    Well-controlled with Myrbetriq      Relevant Medications   mirabegron ER (MYRBETRIQ) 25 MG TB24 tablet    Meds ordered this encounter  Medications   alendronate (FOSAMAX) 70 MG tablet    Sig: TAKE 1 TABLET EVERY WEEK IN THE MORNING 30 MIN BEFORE EATING WITH AN 8OZ GLASS OF WATER (SIT UP 30 MIN)    Dispense:  4 tablet    Refill:  11   blood glucose meter kit and supplies KIT    Sig: Dispense based on  patient and insurance preference. Use up to four times daily as directed.    Dispense:  1 each    Refill:  0    Order Specific Question:   Number of strips    Answer:   100    Order Specific Question:   Number of lancets    Answer:   100   mirabegron ER (MYRBETRIQ) 25 MG TB24 tablet    Sig: Take 1 tablet (25 mg total) by mouth daily.    Dispense:  30 tablet    Refill:  5    Follow-up: Return in about 4 months (around 06/02/2022) for DM and HTN.    Lindell Spar, MD

## 2022-02-02 DIAGNOSIS — R61 Generalized hyperhidrosis: Secondary | ICD-10-CM | POA: Insufficient documentation

## 2022-02-02 NOTE — Assessment & Plan Note (Signed)
Followed by Dr. Reece Levy On [place], Wellbutrin and Cymbalta On clonazepam as needed

## 2022-02-02 NOTE — Assessment & Plan Note (Signed)
Unclear etiology currently, check TSH and free T4 If thyroid function tests are WNL, may have to adjust psychiatric medications -currently on Wellbutrin 300 mg daily, Cymbalta 60 mg twice daily and Lybalvi

## 2022-02-03 ENCOUNTER — Other Ambulatory Visit: Payer: Self-pay | Admitting: Internal Medicine

## 2022-02-03 DIAGNOSIS — E038 Other specified hypothyroidism: Secondary | ICD-10-CM

## 2022-02-03 LAB — CMP14+EGFR
ALT: 22 IU/L (ref 0–32)
AST: 19 IU/L (ref 0–40)
Albumin/Globulin Ratio: 2.1 (ref 1.2–2.2)
Albumin: 4.6 g/dL (ref 3.8–4.8)
Alkaline Phosphatase: 77 IU/L (ref 44–121)
BUN/Creatinine Ratio: 17 (ref 12–28)
BUN: 11 mg/dL (ref 8–27)
Bilirubin Total: 0.4 mg/dL (ref 0.0–1.2)
CO2: 25 mmol/L (ref 20–29)
Calcium: 9.7 mg/dL (ref 8.7–10.3)
Chloride: 95 mmol/L — ABNORMAL LOW (ref 96–106)
Creatinine, Ser: 0.66 mg/dL (ref 0.57–1.00)
Globulin, Total: 2.2 g/dL (ref 1.5–4.5)
Glucose: 74 mg/dL (ref 70–99)
Potassium: 4.4 mmol/L (ref 3.5–5.2)
Sodium: 135 mmol/L (ref 134–144)
Total Protein: 6.8 g/dL (ref 6.0–8.5)
eGFR: 91 mL/min/{1.73_m2} (ref >59)

## 2022-02-03 LAB — HEMOGLOBIN A1C
Est. average glucose Bld gHb Est-mCnc: 128 mg/dL
Hgb A1c MFr Bld: 6.1 % — ABNORMAL HIGH (ref 4.8–5.6)

## 2022-02-03 LAB — TSH+FREE T4
Free T4: 1.17 ng/dL (ref 0.82–1.77)
TSH: 17.8 u[IU]/mL — ABNORMAL HIGH (ref 0.450–4.500)

## 2022-02-03 MED ORDER — LEVOTHYROXINE SODIUM 137 MCG PO TABS: 125.0000 ug | ORAL_TABLET | Freq: Every day | ORAL | 1 refills | Status: DC

## 2022-02-04 ENCOUNTER — Encounter: Payer: Self-pay | Admitting: Internal Medicine

## 2022-02-15 ENCOUNTER — Other Ambulatory Visit: Payer: Self-pay | Admitting: Internal Medicine

## 2022-02-16 ENCOUNTER — Ambulatory Visit (INDEPENDENT_AMBULATORY_CARE_PROVIDER_SITE_OTHER): Payer: PPO | Admitting: Internal Medicine

## 2022-02-16 ENCOUNTER — Encounter: Payer: Self-pay | Admitting: Internal Medicine

## 2022-02-16 DIAGNOSIS — Z0000 Encounter for general adult medical examination without abnormal findings: Secondary | ICD-10-CM | POA: Diagnosis not present

## 2022-02-16 NOTE — Progress Notes (Signed)
Subjective:  This is a telephone encounter between [name] and [name] on 02/16/2022 for AWV. The visit was conducted with the patient located at home and [name] at [facility]. The patient's identity was confirmed using their DOB and current address. The patient has consented to being evaluated through a telephone encounter and understands the associated risks (an examination cannot be done and the patient may need to come in for an appointment) / benefits (allows the patient to remain at home, decreasing exposure to coronavirus).     [name] is a 77 y.o. female who presents for Medicare Annual (Subsequent) preventive examination.  Review of Systems    Review of Systems  Gastrointestinal:  Positive for diarrhea.  All other systems reviewed and are negative.   Objective:    There were no vitals filed for this visit. There is no height or weight on file to calculate BMI.     02/16/2021    4:00 PM 02/13/2021    1:53 PM 02/03/2021    1:05 PM 06/13/2020    7:16 AM 05/23/2020   10:09 AM 12/29/2019    7:19 PM 11/05/2019    4:04 PM  Advanced Directives  Does Patient Have a Medical Advance Directive? Yes No Yes No No No No  Type of Advance Directive Healthcare Power of [place]      Does patient want to make changes to medical advance directive? No - Patient declined        Copy of [place] in Chart? No - copy requested  No - copy requested      Would patient like information on creating a medical advance directive?  No - Patient declined  No - Patient declined No - Patient declined Yes (ED - Information included in AVS) No - Patient declined    Current Medications (verified) Outpatient Encounter Medications as of 02/16/2022  Medication Sig   acetaminophen (TYLENOL) 500 MG tablet Take 1,000 mg by mouth every 6 (six) hours as needed for moderate pain or headache.   albuterol (VENTOLIN HFA) 108 (90 Base) MCG/ACT inhaler  Inhale 2 puffs into the lungs every 6 (six) hours as needed for wheezing or shortness of breath.   alendronate (FOSAMAX) 70 MG tablet TAKE 1 TABLET EVERY WEEK IN THE MORNING 30 MIN BEFORE EATING WITH AN 8OZ GLASS OF WATER (SIT UP 30 MIN)   atorvastatin (LIPITOR) 80 MG tablet TAKE 1 TABLET BY MOUTH ONCE A DAY.   B Complex-C (SUPER B COMPLEX PO) Take 1 tablet by mouth daily.   blood glucose meter kit and supplies KIT Dispense based on patient and insurance preference. Use up to four times daily as directed.   buPROPion (WELLBUTRIN XL) 300 MG 24 hr tablet Take 300 mg by mouth daily.    calcium carbonate (TUMS - DOSED IN MG ELEMENTAL CALCIUM) 500 MG chewable tablet Chew 4 tablets by mouth daily.   Cholecalciferol (VITAMIN D3) 50 MCG (2000 UT) capsule Take 2,000 Units by mouth daily.   clonazePAM (KLONOPIN) 0.5 MG tablet Take 0.25-0.5 mg by mouth in the morning, at noon, and at bedtime.   DEXILANT 60 MG capsule Take 1 capsule (60 mg total) by mouth daily.   diltiazem (CARDIZEM CD) 240 MG 24 hr capsule TAKE (1) CAPSULE BY MOUTH ONCE DAILY.   DULoxetine (CYMBALTA) 60 MG capsule Take 60 mg by mouth 2 (two) times daily.    Lancets (ONETOUCH DELICA PLUS [id number]) MISC USE  TO TEST BLOOD SUGAR TWICE DAILY.   levothyroxine (SYNTHROID) 137 MCG tablet Take 1 tablet (137 mcg total) by mouth daily.   losartan (COZAAR) 50 MG tablet TAKE (1) TABLET BY MOUTH TWICE DAILY   LYBALVI 5-10 MG TABS Take 1 tablet by mouth at bedtime.   metFORMIN (GLUCOPHAGE) 500 MG tablet Take 1 tablet (500 mg total) by mouth 2 (two) times daily with a meal.   methocarbamol (ROBAXIN) 500 MG tablet Take 1 tablet 3 times a day by oral route as needed.   mirabegron ER (MYRBETRIQ) 25 MG TB24 tablet Take 1 tablet (25 mg total) by mouth daily.   Multiple Vitamins-Minerals (ICAPS AREDS 2 PO) Take 1 capsule by mouth 2 (two) times daily.   Multiple Vitamins-Minerals (PRESERVISION AREDS) TABS Take by mouth.   ondansetron (ZOFRAN) 4 MG tablet Take  1 tablet (4 mg total) by mouth every 8 (eight) hours as needed for nausea or vomiting.   ONETOUCH ULTRA test strip USE TO TEST BLOOD SUGAR TWICE DAILY.   pantoprazole (PROTONIX) 40 MG tablet TAKE 1 TABLET 30 MINUTES BEFORE BREAKFAST.   saccharomyces boulardii (FLORASTOR) 250 MG capsule Take 250 mg by mouth daily.   topiramate (TOPAMAX) 50 MG tablet Take 50 mg by mouth at bedtime.   Vitamin D-Vitamin K (VITAMIN K2-VITAMIN D3 PO) Take 1 tablet by mouth daily.   No facility-administered encounter medications on file as of 02/16/2022.    Allergies (verified) Amoxicillin-pot clavulanate, Atenolol, Penicillins, and Sulfonamide derivatives   History: Past Medical History:  Diagnosis Date   Allergic rhinitis    Arthritis    B12 deficiency    Bipolar disorder ([place])    Coronary artery disease    Depression    Diabetes mellitus (No Name)    Diabetes mellitus ([place]) 10/05/2021   Essential hypertension    GERD (gastroesophageal reflux disease)    Hashimoto's thyroiditis    History of transient ischemic attack (TIA)    Or possibly migraine as well as right amaurosis fugax and ataxia - Dr. Erling Cruz   Hyperlipidemia    Hypothyroidism    PSVT (paroxysmal supraventricular tachycardia)    Right bundle branch block    Past Surgical History:  Procedure Laterality Date   CATARACT EXTRACTION W/PHACO Left 05/23/2020   Procedure: CATARACT EXTRACTION PHACO AND INTRAOCULAR LENS PLACEMENT LEFT EYE;  Surgeon: Baruch Goldmann, MD;  Location: [facility];  Service: Ophthalmology;  Laterality: Left;  left CDE=15.69   CATARACT EXTRACTION W/PHACO Right 06/13/2020   Procedure: CATARACT EXTRACTION PHACO AND INTRAOCULAR LENS PLACEMENT RIGHT EYE;  Surgeon: Baruch Goldmann, MD;  Location: [facility];  Service: Ophthalmology;  Laterality: Right;  right CDE=11.33   LAPAROSCOPIC CHOLECYSTECTOMY  2010   THYROIDECTOMY  2009   TOTAL KNEE ARTHROPLASTY Left 02/16/2021   Procedure: TOTAL KNEE ARTHROPLASTY;  Surgeon: Gaynelle Arabian, MD;   Location: [facility];  Service: Orthopedics;  Laterality: Left;   Family History  Problem Relation Age of Onset   Stroke Mother    Thyroid disease Mother    Depression Father    Hypertension Sister    Aneurysm Sister    Diabetes Brother    Heart attack Brother    Hypertension Brother    Heart failure Brother    Thyroid disease Brother    Stroke Maternal Grandmother    Heart failure Maternal Grandmother    Heart failure Maternal Grandfather    Stroke Maternal Grandfather    Heart failure Paternal Grandmother    Social History   Socioeconomic History   Marital  status: Widowed    Spouse name: Not on file   Number of children: 2   Years of education: 59   Highest education level: Not on file  Occupational History   Occupation: homemaker    Employer: RETIRED  Tobacco Use   Smoking status: Former    Packs/day: 0.50    Years: 10.00    Total pack years: 5.00    Types: Cigarettes    Quit date: 01/26/1983    Years since quitting: 39.0   Smokeless tobacco: Never   Tobacco comments:    quit 30 years ago  Vaping Use   Vaping Use: Never used  Substance and Sexual Activity   Alcohol use: No    Alcohol/week: 0.0 standard drinks of alcohol   Drug use: No   Sexual activity: Not Currently    Birth control/protection: Post-menopausal  Other Topics Concern   Not on file  Social History Narrative   Regular exercise: walk when able   Caffeine use: occasionally   Social Determinants of Health   Financial Resource Strain: Low Risk  (02/13/2021)   Overall Financial Resource Strain (CARDIA)    Difficulty of Paying Living Expenses: Not hard at all  Food Insecurity: No Food Insecurity (02/13/2021)   Hunger Vital Sign    Worried About Running Out of Food in the Last Year: Never true    Ran Out of Food in the Last Year: Never true  Transportation Needs: No Transportation Needs (02/13/2021)   PRAPARE - Hydrologist (Medical): No    Lack of Transportation  (Non-Medical): No  Physical Activity: Inactive (02/13/2021)   Exercise Vital Sign    Days of Exercise per Week: 0 days    Minutes of Exercise per Session: 0 min  Stress: No Stress Concern Present (02/13/2021)   [place]    Feeling of Stress : Not at all  Social Connections: Moderately Isolated (02/13/2021)   Social Connection and Isolation Panel [NHANES]    Frequency of Communication with Friends and Family: More than three times a week    Frequency of Social Gatherings with Friends and Family: Never    Attends Religious Services: More than 4 times per year    Active Member of [employer] or Organizations: No    Attends Archivist Meetings: Never    Marital Status: Widowed    Tobacco Counseling Counseling given: Not Answered Tobacco comments: quit 30 years ago   Clinical Intake:  Pre-visit preparation completed: Yes  Pain : 0-10 (stomach pain, she will follow up if not improving)     Diabetes: Yes CBG done?: No Did pt. bring in CBG monitor from home?: No  How often do you need to have someone help you when you read instructions, pamphlets, or other written materials from your doctor or pharmacy?: 3 - Sometimes What is the last grade level you completed in school?: 12th grade  Diabetic?Yes     Activities of Daily Living     No data to display          Patient Care Team: Lindell Spar, MD as PCP - General (Internal Medicine) Satira Sark, MD as PCP - Cardiology (Cardiology) Lavonna Monarch, MD (Inactive) as Consulting Physician (Dermatology)  Indicate any recent Medical Services you may have received from other than [facility] providers in the past year (date may be approximate).     Assessment:   This is a routine wellness examination for  [name].  Hearing/Vision screen No results found.  Dietary issues and exercise activities discussed:     Goals Addressed   None    Depression  Screen    02/16/2022    1:47 PM 02/16/2022    1:46 PM 02/01/2022    2:02 PM 01/22/2022   11:07 AM 10/05/2021    3:01 PM 08/19/2021    4:00 PM 07/01/2021    2:10 PM  PHQ 2/9 Scores  PHQ - 2 Score 2 0 0 0 0 0 0    Fall Risk    02/16/2022    1:47 PM 02/01/2022    2:02 PM 01/22/2022   11:07 AM 10/05/2021    3:01 PM 08/19/2021    4:00 PM  [place] in the past year? 0 0 1 0 0  Number falls in past yr: 0 0 0 0 0  Injury with Fall? 0 0 0 0 0  Risk for fall due to :    No Fall Risks   Follow up    Falls evaluation completed     FALL RISK PREVENTION PERTAINING TO THE HOME:  Any stairs in or around the home? Yes Avoiding upstairs and basement If so, are there any without handrails? Yes  Home free of loose throw rugs in walkways, pet beds, electrical cords, etc? No  Adequate lighting in your home to reduce risk of falls? No   ASSISTIVE DEVICES UTILIZED TO PREVENT FALLS:  Life alert? No  She is considering Use of a cane, walker or w/c? Yes  Grab bars in the bathroom? Yes  Shower chair or bench in shower? Yes  Elevated toilet seat or a handicapped toilet? Yes   Cognitive Function:      02/19/2014    2:11 PM  Montreal Cognitive Assessment   Visuospatial/ Executive (0/5) 2  Naming (0/3) 2  Attention: Read list of digits (0/2) 2  Attention: Read list of letters (0/1) 1  Attention: Serial 7 subtraction starting at 100 (0/3) 2  Language: Repeat phrase (0/2) 2  Language : Fluency (0/1) 1  Abstraction (0/2) 1  Delayed Recall (0/5) 2  Orientation (0/6) 5  Total 20  Adjusted Score (based on education) 21      02/16/2022    1:48 PM 02/13/2021    1:57 PM  6CIT Screen  What Year? 0 points 0 points  What month? 0 points 0 points  What time? 0 points 0 points  Count back from 20 0 points 0 points  Months in reverse 0 points 4 points  Repeat phrase 10 points 0 points  Total Score 10 points 4 points    Immunizations Immunization History  Administered Date(s) Administered    Fluad Quad(high Dose 65+) 10/30/2020, 10/05/2021   Influenza,inj,quad, With Preservative 10/26/2018   Moderna Sars-Covid-2 Vaccination 02/26/2019, 03/19/2019   Pneumococcal Conjugate-13 04/21/2020   Pneumococcal Polysaccharide-23 05/26/2021   Tdap 06/10/2021   Zoster Recombinat (Shingrix) 06/10/2021, 10/07/2021    TDAP status: Up to date  Flu Vaccine status: Up to date  Pneumococcal vaccine status: Up to date  Covid-19 vaccine status: Declined, Education has been provided regarding the importance of this vaccine but patient still declined. Advised may receive this vaccine at local pharmacy or Health Dept.or vaccine clinic. Aware to provide a copy of the vaccination record if obtained from local pharmacy or Health Dept. Verbalized acceptance and understanding.  Qualifies for Shingles Vaccine? Yes   Zostavax completed No   Shingrix Completed?: Yes  Screening Tests  Health Maintenance  Topic Date Due   COVID-19 Vaccine (3 - Moderna risk series) 04/16/2019   Medicare Annual Wellness (AWV)  02/13/2022   Diabetic kidney evaluation - Urine ACR  08/01/2022   HEMOGLOBIN A1C  08/02/2022   FOOT EXAM  10/06/2022   OPHTHALMOLOGY EXAM  10/22/2022   Diabetic kidney evaluation - eGFR measurement  02/02/2023   Fecal DNA (Cologuard)  11/26/2023   DTaP/Tdap/Td (2 - Td or Tdap) 06/11/2031   Pneumonia Vaccine 58+ Years old  Completed   INFLUENZA VACCINE  Completed   DEXA SCAN  Completed   Hepatitis C Screening  Completed   Zoster Vaccines- Shingrix  Completed   HPV VACCINES  Aged Out    Health Maintenance  Health Maintenance Due  Topic Date Due   COVID-19 Vaccine (3 - Moderna risk series) 04/16/2019   Medicare Annual Wellness (AWV)  02/13/2022    Colorectal cancer screening: Type of screening: Cologuard. Completed 11/25/2020. It was positive.   Mammogram status: No longer required due to age.  Bone Density status: Completed 06/04/2020. Results reflect: Bone density results: OSTEOPOROSIS.    Lung Cancer Screening: (Low Dose CT Chest recommended if Age 63-80 years, 30 pack-year currently smoking OR have quit w/in 15years.) does not qualify.    Additional Screening:  Hepatitis C Screening: does not qualify; Completed 08/20/2020  Vision Screening: Recommended annual ophthalmology exams for early detection of glaucoma and other disorders of the eye. Is the patient up to date with their annual eye exam?  Yes  Who is the provider or what is the name of the office in which the patient attends annual eye exams? [facility] If pt is not established with a provider, would they like to be referred to a provider to establish care? No .   Dental Screening: Recommended annual dental exams for proper oral hygiene  Community Resource Referral / Chronic Care Management: CRR required this visit?  No   CCM required this visit?  No      Plan:     I have personally reviewed and noted the following in the patient's chart:   Medical and social history Use of alcohol, tobacco or illicit drugs  Current medications and supplements including opioid prescriptions. Patient is not currently taking opioid prescriptions. Functional ability and status Nutritional status Physical activity Advanced directives List of other physicians Hospitalizations, surgeries, and ER visits in previous 12 months Vitals Screenings to include cognitive, depression, and falls Referrals and appointments  In addition, I have reviewed and discussed with patient certain preventive protocols, quality metrics, and best practice recommendations. A written personalized care plan for preventive services as well as general preventive health recommendations were provided to patient.     Carrie Dy, MD   02/16/2022

## 2022-02-16 NOTE — Patient Instructions (Signed)
  [name] , Thank you for taking time to come for your Medicare Wellness Visit. I appreciate your ongoing commitment to your health goals. Please review the following plan we discussed and let me know if I can assist you in the future.   These are the goals we discussed: Patient is going to read more to keep her mind sharp. I placed referral for colonoscopy after having positive cologuard 3 years ago.   This is a list of the screening recommended for you and due dates:  Health Maintenance  Topic Date Due   COVID-19 Vaccine (3 - Moderna risk series) 04/16/2019   Medicare Annual Wellness Visit  02/13/2022   Yearly kidney health urinalysis for diabetes  08/01/2022   Hemoglobin A1C  08/02/2022   Complete foot exam   10/06/2022   Eye exam for diabetics  10/22/2022   Yearly kidney function blood test for diabetes  02/02/2023   Cologuard (Stool DNA test)  11/26/2023   DTaP/Tdap/Td vaccine (2 - Td or Tdap) 06/11/2031   Pneumonia Vaccine  Completed   Flu Shot  Completed   DEXA scan (bone density measurement)  Completed   Hepatitis C Screening: USPSTF Recommendation to screen - Ages 16-79 yo.  Completed   Zoster (Shingles) Vaccine  Completed   HPV Vaccine  Aged Out

## 2022-02-17 ENCOUNTER — Encounter: Payer: Self-pay | Admitting: *Deleted

## 2022-02-22 ENCOUNTER — Encounter: Payer: Self-pay | Admitting: Nutrition

## 2022-02-22 ENCOUNTER — Encounter: Payer: PPO | Attending: Internal Medicine | Admitting: Nutrition

## 2022-02-22 DIAGNOSIS — E559 Vitamin D deficiency, unspecified: Secondary | ICD-10-CM | POA: Diagnosis not present

## 2022-02-22 DIAGNOSIS — E118 Type 2 diabetes mellitus with unspecified complications: Secondary | ICD-10-CM | POA: Diagnosis not present

## 2022-02-22 DIAGNOSIS — E782 Mixed hyperlipidemia: Secondary | ICD-10-CM | POA: Diagnosis not present

## 2022-02-22 DIAGNOSIS — I10 Essential (primary) hypertension: Secondary | ICD-10-CM | POA: Diagnosis not present

## 2022-02-22 NOTE — Progress Notes (Signed)
Medical Nutrition Therapy  Appointment Start time:  1300  Appointment End time:  1315  Primary concerns today: Obesity, Hyperlipidemia, Referral diagnosis: E 66.9, E78 and E11.8 Preferred learning style: no preference indicated  See and read Learning readiness:  ready  NUTRITION ASSESSMENT  77 yr old female with A1C 6.7%.   Forgot her meter and log sheets. She notes her BS are more in range now than they had been over holidays. Her  psychiartrist, Dr. Reece Levy. She notes she feels like her mind is blank today and can't remember certain things. She notes she had been on some medication and had stopped it. Will talk to MD about getting back on it.. Has cut out snacks. Got rid of them in the house.  No longer on steroids. Says her memory isn't good today. Has lost 5 lbs. Feeling better. Has pain in her right walking. Sees Orthopediic MD on Friday for possible knee replacement.  Goals previously set: Eat three meals per day-working on it. Eat more whole food, plant based Drink only water-done Avoid snacks between meals-done Don't eat after 7 pm. done Test blood sugars twice a day; before breakfast and bedtime. Done Walk as tolerated work in progress. Lose 2 lbs per month- lost 5 lbs since last visit. Get A1C down to 6%. Look into the Healthy Choice Power Bowl meals- done.  Anthropometrics   Wt Readings from Last 3 Encounters:  02/01/22 185 lb 9.6 oz (84.2 kg)  01/22/22 189 lb 3.2 oz (85.8 kg)  10/05/21 188 lb 12.8 oz (85.6 kg)   Ht Readings from Last 3 Encounters:  02/01/22 '5\' 2"'$  (1.575 m)  01/22/22 '5\' 2"'$  (1.575 m)  10/05/21 '5\' 2"'$  (1.575 m)   There is no height or weight on file to calculate BMI. '@BMIFA'$ @ Facility age limit for growth %iles is 20 years. Facility age limit for growth %iles is 20 years.   Clinical Medical Hx: IBS, DM, Obesity, HTN, Hyperlipidemia Medications:  See chart Labs:  Lab Results  Component Value Date   HGBA1C 6.1 (H) 02/01/2022       Latest  Ref Rng & Units 02/01/2022    2:42 PM 10/05/2021    4:31 PM 07/31/2021    2:53 PM  CMP  Glucose 70 - 99 mg/dL 74  88  93   BUN 8 - 27 mg/dL '11  10  13   '$ Creatinine 0.57 - 1.00 mg/dL 0.66  0.73  0.55   Sodium 134 - 144 mmol/L 135  136  133   Potassium 3.5 - 5.2 mmol/L 4.4  5.1  4.0   Chloride 96 - 106 mmol/L 95  95  102   CO2 20 - 29 mmol/L '25  26  24   '$ Calcium 8.7 - 10.3 mg/dL 9.7  9.8  9.3   Total Protein 6.0 - 8.5 g/dL 6.8  6.9    Total Bilirubin 0.0 - 1.2 mg/dL 0.4  0.3    Alkaline Phos 44 - 121 IU/L 77  91    AST 0 - 40 IU/L 19  19    ALT 0 - 32 IU/L 22  20     Lipid Panel     Component Value Date/Time   CHOL 149 05/26/2021 1619   TRIG 94 05/26/2021 1619   HDL 59 05/26/2021 1619   CHOLHDL 2.5 05/26/2021 1619   LDLCALC 73 05/26/2021 1619   LABVLDL 17 05/26/2021 1619     Notable Signs/Symptoms: fatigue, irregular eating pattern  Lifestyle & Dietary Hx  Doesn't like to cook. Lives by herself.  Unstable on feet. Has fallen at times. Skips meals at times  Estimated daily fluid intake: 16 oz Supplements:  Sleep: varies-doesn't sleep well. Stays up and sleeps in. Stress / self-care: health issues, can't walk well or get around. Current average weekly physical activity: ADL  24-Hr Dietary Recall Eating 2 meals per day. Trying to eat breakfast now. Drinking water. Has cut out snacks.  Estimated Energy Needs Calories: 1200 Carbohydrate: 135g Protein: 90g Fat: 44g   NUTRITION DIAGNOSIS  NB-1.1 Food and nutrition-related knowledge deficit As related to Obesity and prediabetes.  As evidenced by BMI > 30 and fasting blood sugars elevated. NUTRITION INTERVENTION  Nutrition education (E-1) on the following topics:  Nutrition and Diabetes education provided on My Plate, CHO counting, meal planning, portion sizes, timing of meals, avoiding snacks between meals unless having a low blood sugar, target ranges for A1C and blood sugars, signs/symptoms and treatment of  hyper/hypoglycemia, monitoring blood sugars, taking medications as prescribed, benefits of exercising 30 minutes per day and prevention of complications of DM. Low Cnolesterol Low Sodium Diet Ways to cut down on empty calories.  Handouts Provided Include  The Plate Method  Meal Plan Card Diabetes Instructions.   Learning Style & Readiness for Change Teaching method utilized: Visual & Auditory  Demonstrated degree of understanding via: Teach Back  Barriers to learning/adherence to lifestyle change: none  Goals Established by Pt  Goals   Try old fashion oatmeal for breakfast with fruit, nuts and cinnamon. Continue to increase fresh fruits, vegetables and whole grains. Drink 4 bottles of water per day    MONITORING & EVALUATION Dietary intake, weekly physical activity, and weight in 3-4 month.  Next Steps  Patient is to work on eating better balanced meals.Marland Kitchen

## 2022-02-22 NOTE — Patient Instructions (Addendum)
Goals   Try old fashion oatmeal for breakfast with fruit, nuts and cinnamon. Continue to increase fresh fruits, vegetables and whole grains. Drink 4 bottles of water per day

## 2022-02-26 DIAGNOSIS — Z96652 Presence of left artificial knee joint: Secondary | ICD-10-CM | POA: Diagnosis not present

## 2022-02-26 DIAGNOSIS — M1711 Unilateral primary osteoarthritis, right knee: Secondary | ICD-10-CM | POA: Diagnosis not present

## 2022-03-08 ENCOUNTER — Other Ambulatory Visit: Payer: Self-pay | Admitting: Internal Medicine

## 2022-03-08 DIAGNOSIS — E1169 Type 2 diabetes mellitus with other specified complication: Secondary | ICD-10-CM

## 2022-03-11 ENCOUNTER — Other Ambulatory Visit: Payer: Self-pay | Admitting: Family Medicine

## 2022-03-15 NOTE — Progress Notes (Unsigned)
Referring Provider:Steen, Maudry Mayhew, MD  Primary Care Physician:  Lindell Spar, MD Primary Gastroenterologist:  Dr. Paulita Fujita previously, establishing with. ***  No chief complaint on file.   HPI:   IYSIS ABEGG is a 77 y.o. female with history of arthritis, B12 deficiency, bipolar disorder, CAD, depression, diabetes, HTN, Hashimoto thyroiditis, HLD, PSVT, possible TIA, GERD, presenting today at the request of Lyndal Pulley, MD for positive Cologuard, lost to follow-up.  Cologuard + 11/25/2020.  Today:    Past Medical History:  Diagnosis Date   Allergic rhinitis    Arthritis    B12 deficiency    Bipolar disorder ([place])    Coronary artery disease    Depression    Diabetes mellitus ([place])    Diabetes mellitus ([place]) 10/05/2021   Essential hypertension    GERD (gastroesophageal reflux disease)    Hashimoto's thyroiditis    History of transient ischemic attack (TIA)    Or possibly migraine as well as right amaurosis fugax and ataxia - Dr. Erling Cruz   Hyperlipidemia    Hypothyroidism    PSVT (paroxysmal supraventricular tachycardia)    Right bundle branch block     Past Surgical History:  Procedure Laterality Date   CATARACT EXTRACTION W/PHACO Left 05/23/2020   Procedure: CATARACT EXTRACTION PHACO AND INTRAOCULAR LENS PLACEMENT LEFT EYE;  Surgeon: Baruch Goldmann, MD;  Location: [facility];  Service: Ophthalmology;  Laterality: Left;  left CDE=15.69   CATARACT EXTRACTION W/PHACO Right 06/13/2020   Procedure: CATARACT EXTRACTION PHACO AND INTRAOCULAR LENS PLACEMENT RIGHT EYE;  Surgeon: Baruch Goldmann, MD;  Location: [facility];  Service: Ophthalmology;  Laterality: Right;  right CDE=11.33   LAPAROSCOPIC CHOLECYSTECTOMY  2010   THYROIDECTOMY  2009   TOTAL KNEE ARTHROPLASTY Left 02/16/2021   Procedure: TOTAL KNEE ARTHROPLASTY;  Surgeon: Gaynelle Arabian, MD;  Location: [facility];  Service: Orthopedics;  Laterality: Left;    Current Outpatient Medications  Medication Sig Dispense Refill    acetaminophen (TYLENOL) 500 MG tablet Take 1,000 mg by mouth every 6 (six) hours as needed for moderate pain or headache.     albuterol (VENTOLIN HFA) 108 (90 Base) MCG/ACT inhaler Inhale 2 puffs into the lungs every 6 (six) hours as needed for wheezing or shortness of breath. 18 g 0   alendronate (FOSAMAX) 70 MG tablet TAKE 1 TABLET EVERY WEEK IN THE MORNING 30 MIN BEFORE EATING WITH AN 8OZ GLASS OF WATER (SIT UP 30 MIN) 4 tablet 11   atorvastatin (LIPITOR) 80 MG tablet TAKE 1 TABLET BY MOUTH ONCE A DAY. 90 tablet 1   B Complex-C (SUPER B COMPLEX PO) Take 1 tablet by mouth daily.     blood glucose meter kit and supplies KIT Dispense based on patient and insurance preference. Use up to four times daily as directed. 1 each 0   buPROPion (WELLBUTRIN XL) 300 MG 24 hr tablet Take 300 mg by mouth daily.      calcium carbonate (TUMS - DOSED IN MG ELEMENTAL CALCIUM) 500 MG chewable tablet Chew 4 tablets by mouth daily.     Cholecalciferol (VITAMIN D3) 50 MCG (2000 UT) capsule Take 2,000 Units by mouth daily.     clonazePAM (KLONOPIN) 0.5 MG tablet Take 0.25-0.5 mg by mouth in the morning, at noon, and at bedtime.     DEXILANT 60 MG capsule Take 1 capsule (60 mg total) by mouth daily. 30 capsule 0   diltiazem (CARDIZEM CD) 240 MG 24 hr capsule TAKE (1) CAPSULE BY MOUTH ONCE DAILY.  90 capsule 3   DULoxetine (CYMBALTA) 60 MG capsule Take 60 mg by mouth 2 (two) times daily.      Lancets (ONETOUCH DELICA PLUS 123XX123) MISC USE TO TEST BLOOD SUGAR TWICE DAILY. 100 each 0   levothyroxine (SYNTHROID) 137 MCG tablet Take 1 tablet (137 mcg total) by mouth daily. 90 tablet 1   losartan (COZAAR) 50 MG tablet TAKE (1) TABLET BY MOUTH TWICE DAILY 60 tablet 0   LYBALVI 5-10 MG TABS Take 1 tablet by mouth at bedtime.     metFORMIN (GLUCOPHAGE) 500 MG tablet Take 1 tablet (500 mg total) by mouth 2 (two) times daily with a meal. 60 tablet 0   methocarbamol (ROBAXIN) 500 MG tablet Take 1 tablet 3 times a day by oral route  as needed.     mirabegron ER (MYRBETRIQ) 25 MG TB24 tablet Take 1 tablet (25 mg total) by mouth daily. 30 tablet 5   Multiple Vitamins-Minerals (ICAPS AREDS 2 PO) Take 1 capsule by mouth 2 (two) times daily.     Multiple Vitamins-Minerals (PRESERVISION AREDS) TABS Take by mouth.     ondansetron (ZOFRAN) 4 MG tablet Take 1 tablet (4 mg total) by mouth every 8 (eight) hours as needed for nausea or vomiting. 20 tablet 0   ONETOUCH ULTRA test strip USE TO TEST BLOOD SUGAR TWICE DAILY. 50 each 0   pantoprazole (PROTONIX) 40 MG tablet TAKE 1 TABLET 30 MINUTES BEFORE BREAKFAST. 30 tablet 0   saccharomyces boulardii (FLORASTOR) 250 MG capsule Take 250 mg by mouth daily.     topiramate (TOPAMAX) 50 MG tablet Take 50 mg by mouth at bedtime.     Vitamin D-Vitamin K (VITAMIN K2-VITAMIN D3 PO) Take 1 tablet by mouth daily.     No current facility-administered medications for this visit.    Allergies as of 03/17/2022 - Review Complete 02/22/2022  Allergen Reaction Noted   Amoxicillin-pot clavulanate Rash 11/06/2009   Atenolol Rash 11/04/2011   Penicillins Rash 07/04/2014   Sulfonamide derivatives Rash 11/06/2009    Family History  Problem Relation Age of Onset   Stroke Mother    Thyroid disease Mother    Depression Father    Hypertension Sister    Aneurysm Sister    Diabetes Brother    Heart attack Brother    Hypertension Brother    Heart failure Brother    Thyroid disease Brother    Stroke Maternal Grandmother    Heart failure Maternal Grandmother    Heart failure Maternal Grandfather    Stroke Maternal Grandfather    Heart failure Paternal Grandmother     Social History   Socioeconomic History   Marital status: Widowed    Spouse name: Not on file   Number of children: 2   Years of education: 64   Highest education level: Not on file  Occupational History   Occupation: homemaker    Employer: RETIRED  Tobacco Use   Smoking status: Former    Packs/day: 0.50    Years: 10.00     Total pack years: 5.00    Types: Cigarettes    Quit date: 01/26/1983    Years since quitting: 39.1   Smokeless tobacco: Never   Tobacco comments:    quit 30 years ago  Vaping Use   Vaping Use: Never used  Substance and Sexual Activity   Alcohol use: No    Alcohol/week: 0.0 standard drinks of alcohol   Drug use: No   Sexual activity: Not Currently  Birth control/protection: Post-menopausal  Other Topics Concern   Not on file  Social History Narrative   Regular exercise: walk when able   Caffeine use: occasionally   Social Determinants of Health   Financial Resource Strain: Low Risk  (02/13/2021)   Overall Financial Resource Strain (CARDIA)    Difficulty of Paying Living Expenses: Not hard at all  Food Insecurity: No Food Insecurity (02/13/2021)   Hunger Vital Sign    Worried About Running Out of Food in the Last Year: Never true    Ran Out of Food in the Last Year: Never true  Transportation Needs: No Transportation Needs (02/13/2021)   PRAPARE - Hydrologist (Medical): No    Lack of Transportation (Non-Medical): No  Physical Activity: Inactive (02/13/2021)   Exercise Vital Sign    Days of Exercise per Week: 0 days    Minutes of Exercise per Session: 0 min  Stress: No Stress Concern Present (02/13/2021)   [place]    Feeling of Stress : Not at all  Social Connections: Moderately Isolated (02/13/2021)   Social Connection and Isolation Panel [NHANES]    Frequency of Communication with Friends and Family: More than three times a week    Frequency of Social Gatherings with Friends and Family: Never    Attends Religious Services: More than 4 times per year    Active Member of [employer] or Organizations: No    Attends Archivist Meetings: Never    Marital Status: Widowed  Intimate Partner Violence: Not At Risk (02/13/2021)   Humiliation, Afraid, Rape, and Kick questionnaire     Fear of Current or Ex-Partner: No    Emotionally Abused: No    Physically Abused: No    Sexually Abused: No    Review of Systems: Gen: Denies any fever, chills, fatigue, weight loss, lack of appetite.  CV: Denies chest pain, heart palpitations, peripheral edema, syncope.  Resp: Denies shortness of breath at rest or with exertion. Denies wheezing or cough.  GI: Denies dysphagia or odynophagia. Denies jaundice, hematemesis, fecal incontinence. GU : Denies urinary burning, urinary frequency, urinary hesitancy MS: Denies joint pain, muscle weakness, cramps, or limitation of movement.  Derm: Denies rash, itching, dry skin Psych: Denies depression, anxiety, memory loss, and confusion Heme: Denies bruising, bleeding, and enlarged lymph nodes.  Physical Exam: There were no vitals taken for this visit. General:   Alert and oriented. Pleasant and cooperative. Well-nourished and well-developed.  Head:  Normocephalic and atraumatic. Eyes:  Without icterus, sclera clear and conjunctiva pink.  Ears:  Normal auditory acuity. Lungs:  Clear to auscultation bilaterally. No wheezes, rales, or rhonchi. No distress.  Heart:  S1, S2 present without murmurs appreciated.  Abdomen:  +BS, soft, non-tender and non-distended. No HSM noted. No guarding or rebound. No masses appreciated.  Rectal:  Deferred  Msk:  Symmetrical without gross deformities. Normal posture. Extremities:  Without edema. Neurologic:  Alert and  oriented x4;  grossly normal neurologically. Skin:  Intact without significant lesions or rashes. Psych:  Alert and cooperative. Normal mood and affect.    Assessment:     Plan:  ***   Aliene Altes, PA-C [facility] 03/17/2022

## 2022-03-17 ENCOUNTER — Encounter: Payer: Self-pay | Admitting: *Deleted

## 2022-03-17 ENCOUNTER — Encounter: Payer: Self-pay | Admitting: Gastroenterology

## 2022-03-17 ENCOUNTER — Ambulatory Visit (INDEPENDENT_AMBULATORY_CARE_PROVIDER_SITE_OTHER): Payer: PPO | Admitting: Gastroenterology

## 2022-03-17 ENCOUNTER — Other Ambulatory Visit: Payer: Self-pay | Admitting: Gastroenterology

## 2022-03-17 ENCOUNTER — Other Ambulatory Visit: Payer: Self-pay | Admitting: *Deleted

## 2022-03-17 VITALS — BP 140/70 | HR 72 | Temp 96.9°F | Ht 62.0 in | Wt 192.4 lb

## 2022-03-17 DIAGNOSIS — R5383 Other fatigue: Secondary | ICD-10-CM | POA: Diagnosis not present

## 2022-03-17 DIAGNOSIS — K219 Gastro-esophageal reflux disease without esophagitis: Secondary | ICD-10-CM

## 2022-03-17 DIAGNOSIS — R195 Other fecal abnormalities: Secondary | ICD-10-CM | POA: Diagnosis not present

## 2022-03-17 DIAGNOSIS — I10 Essential (primary) hypertension: Secondary | ICD-10-CM | POA: Diagnosis not present

## 2022-03-17 MED ORDER — NA SULFATE-K SULFATE-MG SULF 17.5-3.13-1.6 GM/177ML PO SOLN: ORAL | 0 refills | Status: DC

## 2022-03-17 NOTE — Patient Instructions (Addendum)
Please have blood work completed at [facility].  Stop taking pantoprazole 40 mg. Only take Dexilant 60 mg daily for your reflux.   You can try taking benefiber to keep your bowels moving well. Follow instructions on container.   We will arrange for you to have a colonoscopy with Dr. Gala Romney. 1 day prior: Hold your evening dose of metformin.  Day of the procedure: No morning diabetes medications.   We will see you back as needed.   It was a pleasure to meet you today! I want to create trusting relationships with patients. If you receive a survey regarding your visit,  I greatly appreciate you taking time to fill this out on paper or through your MyChart. I value your feedback.  Aliene Altes, PA-C [facility]

## 2022-03-18 ENCOUNTER — Encounter: Payer: Self-pay | Admitting: Gastroenterology

## 2022-03-18 LAB — CBC WITH DIFFERENTIAL/PLATELET
Basophils Absolute: 0 10*3/uL (ref 0.0–0.2)
Basos: 0 %
EOS (ABSOLUTE): 0.2 10*3/uL (ref 0.0–0.4)
Eos: 2 %
Hematocrit: 38.9 % (ref 34.0–46.6)
Hemoglobin: 13.4 g/dL (ref 11.1–15.9)
Immature Grans (Abs): 0 10*3/uL (ref 0.0–0.1)
Immature Granulocytes: 0 %
Lymphocytes Absolute: 2.4 10*3/uL (ref 0.7–3.1)
Lymphs: 21 %
MCH: 32.4 pg (ref 26.6–33.0)
MCHC: 34.4 g/dL (ref 31.5–35.7)
MCV: 94 fL (ref 79–97)
Monocytes Absolute: 1.1 10*3/uL — ABNORMAL HIGH (ref 0.1–0.9)
Monocytes: 9 %
Neutrophils Absolute: 7.7 10*3/uL — ABNORMAL HIGH (ref 1.4–7.0)
Neutrophils: 68 %
Platelets: 431 10*3/uL (ref 150–450)
RBC: 4.13 x10E6/uL (ref 3.77–5.28)
RDW: 13.1 % (ref 11.7–15.4)
WBC: 11.4 10*3/uL — ABNORMAL HIGH (ref 3.4–10.8)

## 2022-03-23 IMAGING — MG MM DIGITAL SCREENING BILAT W/ TOMO AND CAD
6 of 12 series · 6 of 36 positions shown · non-contrast
Comparison: None.

CLINICAL DATA: Screening.

EXAM:
DIGITAL SCREENING BILATERAL MAMMOGRAM WITH TOMOSYNTHESIS AND CAD
TECHNIQUE: Bilateral screening digital craniocaudal and mediolateral oblique
mammograms were obtained. Bilateral screening digital breast
tomosynthesis was performed. The images were evaluated with
computer-aided detection.

[L MLO synth-2D]
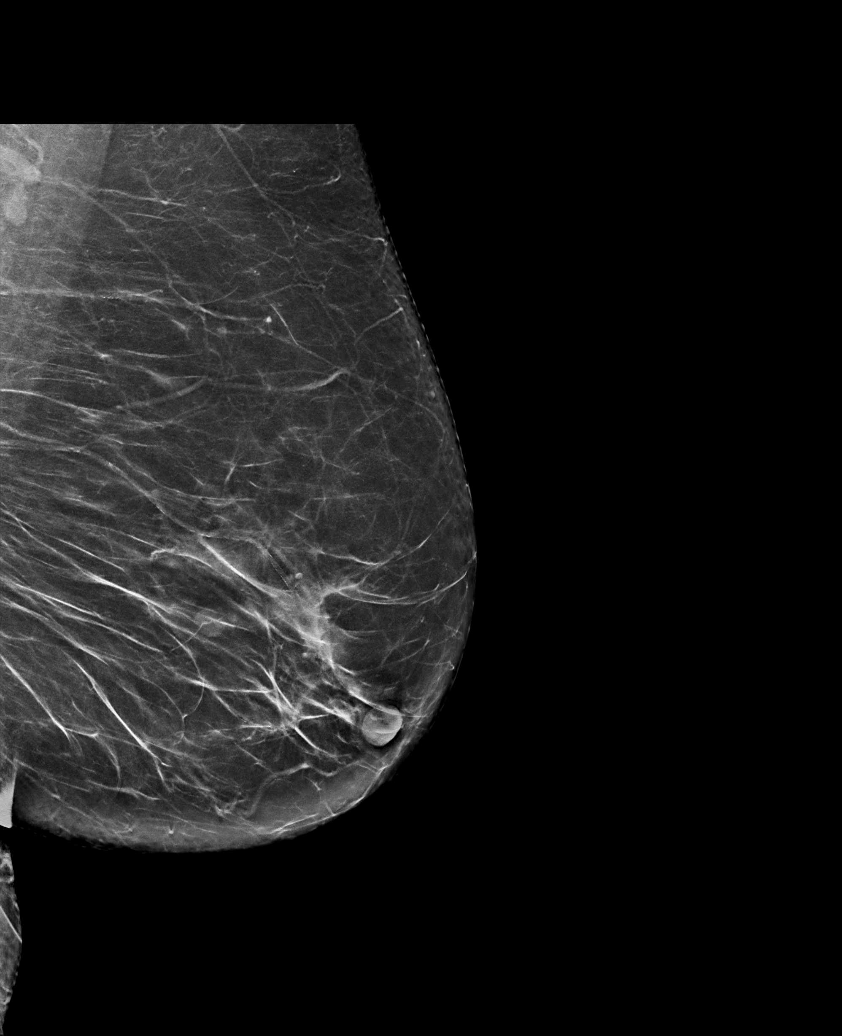

[L CC synth-2D (1 of 2)]
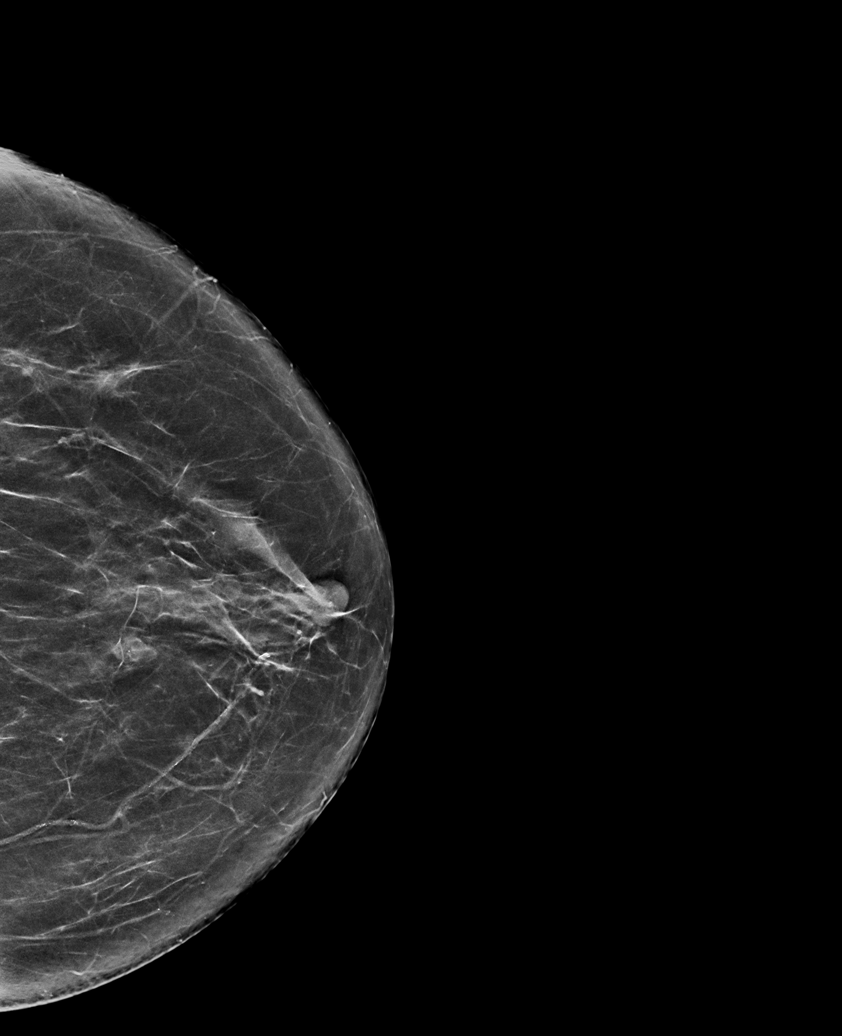

[R CC synth-2D (1 of 2)]
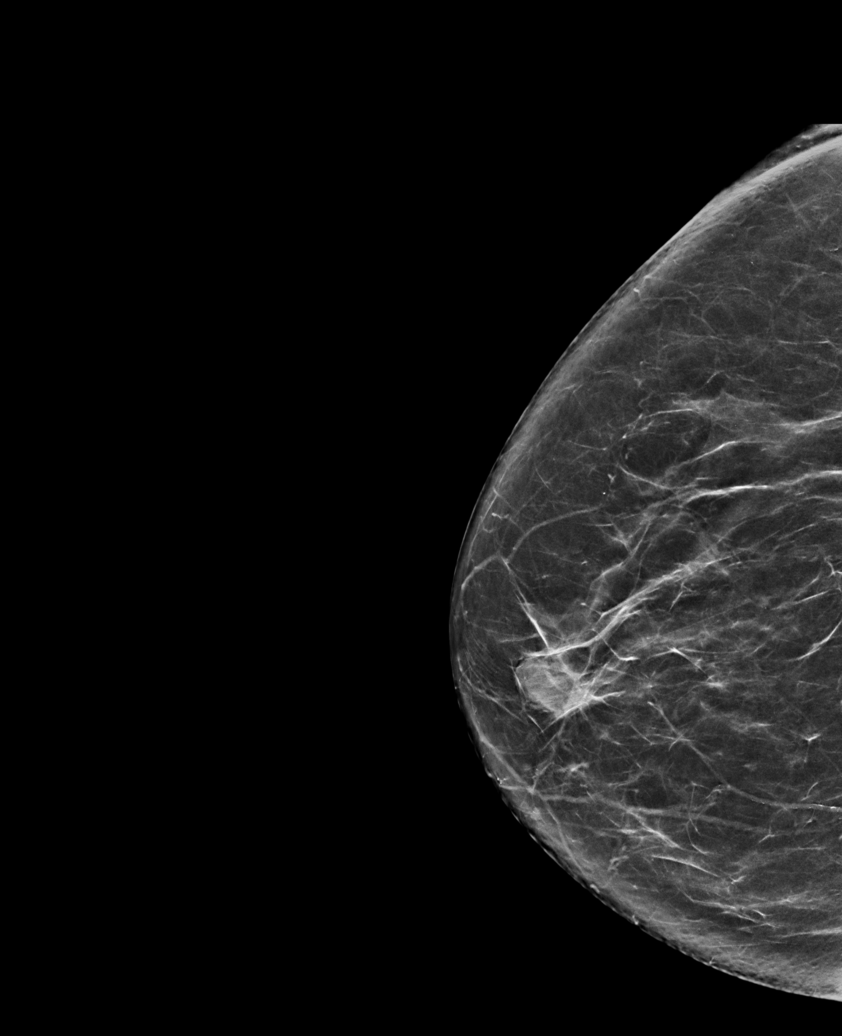

[R MLO synth-2D]
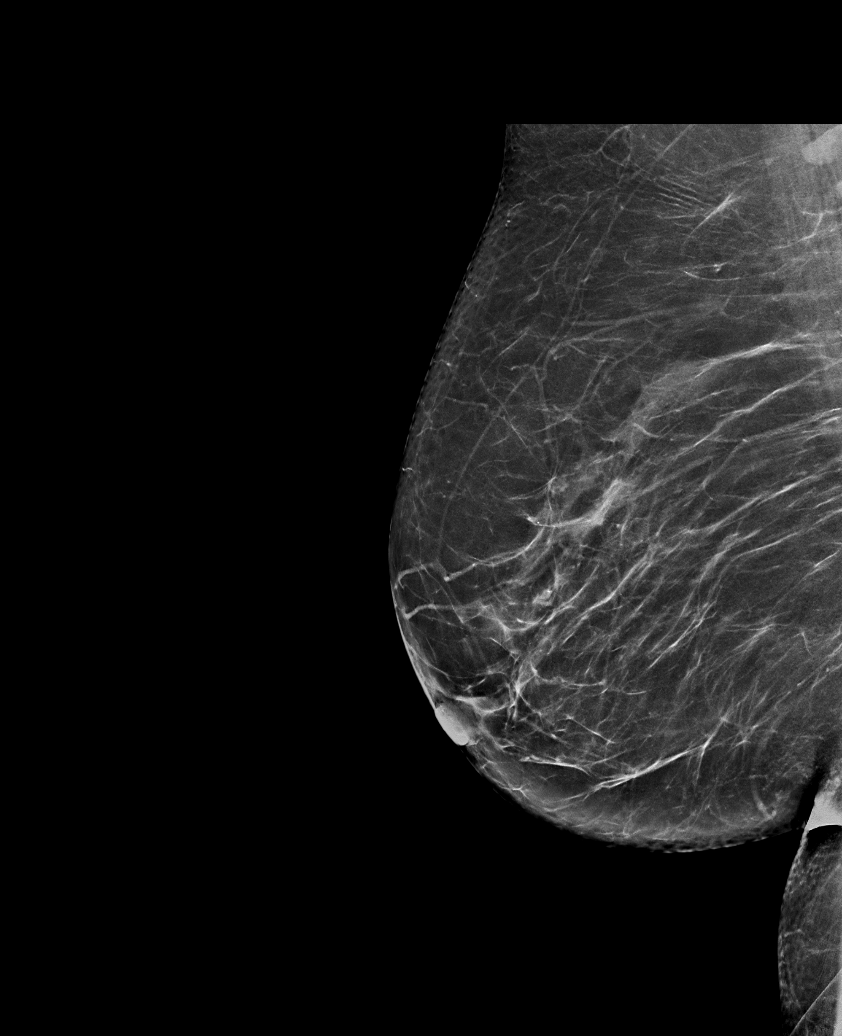

[L CC synth-2D (2 of 2)]
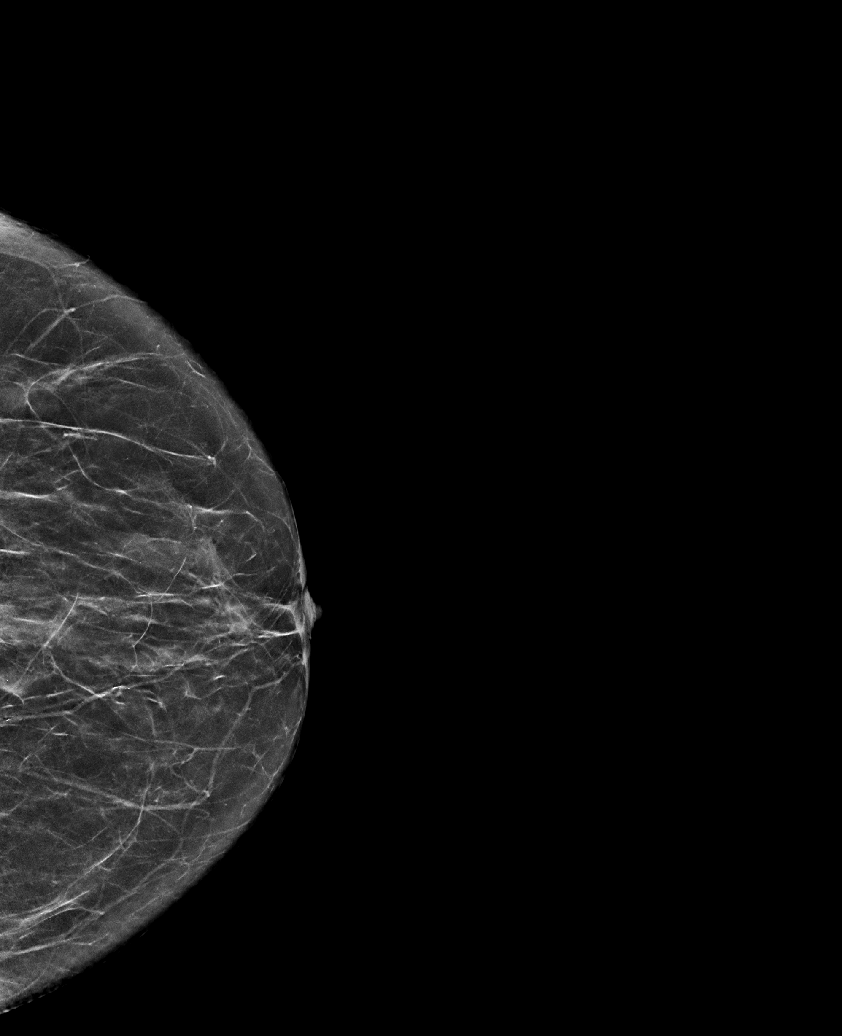

[R CC synth-2D (2 of 2)]
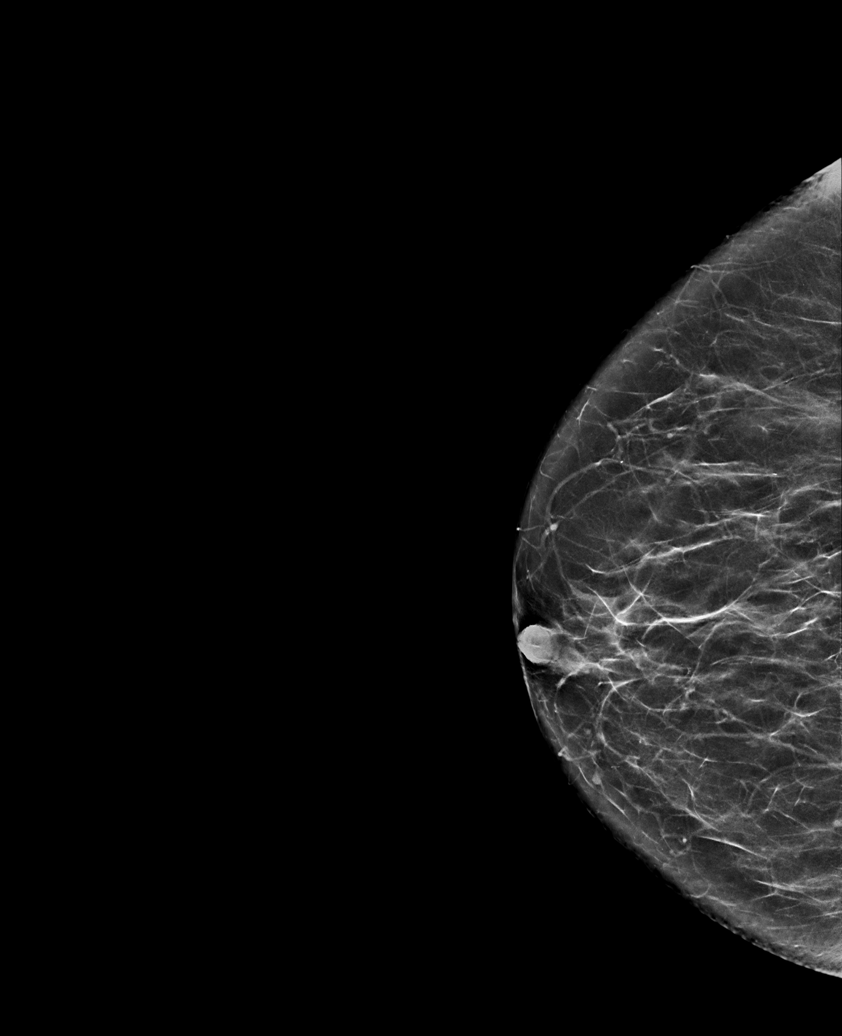

[6 of 36 positions shown; findings below may reference images not displayed]

ACR Breast Density Category b: There are scattered areas of
fibroglandular density.
FINDINGS: In the left breast, a possible mass warrants further evaluation. In
the right breast, no findings suspicious for malignancy.
IMPRESSION: Further evaluation is suggested for a possible mass in the left
breast.

RECOMMENDATION:
Diagnostic mammogram and possibly ultrasound of the left breast.
(Code:[id number])

The patient will be contacted regarding the findings, and additional
imaging will be scheduled.

BI-RADS CATEGORY  0: Incomplete. Need additional imaging evaluation
and/or prior mammograms for comparison.

## 2022-03-30 ENCOUNTER — Ambulatory Visit (INDEPENDENT_AMBULATORY_CARE_PROVIDER_SITE_OTHER): Payer: PPO | Admitting: Internal Medicine

## 2022-03-30 ENCOUNTER — Encounter: Payer: Self-pay | Admitting: Internal Medicine

## 2022-03-30 VITALS — BP 132/76 | HR 82 | Ht 62.0 in | Wt 189.4 lb

## 2022-03-30 DIAGNOSIS — I10 Essential (primary) hypertension: Secondary | ICD-10-CM

## 2022-03-30 DIAGNOSIS — E1169 Type 2 diabetes mellitus with other specified complication: Secondary | ICD-10-CM | POA: Diagnosis not present

## 2022-03-30 DIAGNOSIS — E890 Postprocedural hypothyroidism: Secondary | ICD-10-CM

## 2022-03-30 DIAGNOSIS — R61 Generalized hyperhidrosis: Secondary | ICD-10-CM

## 2022-03-30 MED ORDER — LOSARTAN POTASSIUM 50 MG PO TABS: 50.0000 mg | ORAL_TABLET | Freq: Two times a day (BID) | ORAL | 5 refills | Status: DC

## 2022-03-30 MED ORDER — METFORMIN HCL 500 MG PO TABS: 500.0000 mg | ORAL_TABLET | Freq: Two times a day (BID) | ORAL | 5 refills | Status: DC

## 2022-03-30 NOTE — Assessment & Plan Note (Signed)
Unclear etiology currently, check TSH and free T4 If thyroid function tests are WNL, may have to adjust psychiatric medications -currently on Wellbutrin 300 mg daily, Cymbalta 60 mg twice daily and Lybalvi Needs to check blood glucose when she has episode, could be hypoglycemia as well

## 2022-03-30 NOTE — Assessment & Plan Note (Signed)
Lab Results  Component Value Date   HGBA1C 6.1 (H) 02/01/2022   Associated with HTN and HLD Well-controlled On Metformin Advised to check her blood glucose when she has episodes of sweating and facial flushing-could be hypoglycemia Advised to follow diabetic diet On ARB and statin F/u CMP and lipid panel Diabetic eye exam: Advised to follow up with Ophthalmology for diabetic eye exam

## 2022-03-30 NOTE — Patient Instructions (Addendum)
Please continue taking medications as prescribed.  Please check blood glucose when you have episode of excessive sweating. Please eat a cracker or have snacks if blood glucose os less than 80.  Please discuss with your Psychiatrist to decrease dose of Wellbutrin or Cymbalta.  Please continue to follow low carb diet and ambulate as tolerated.

## 2022-03-30 NOTE — Progress Notes (Signed)
Established Patient Office Visit  Subjective:  Patient ID: [name], female    DOB: 1945-04-12  Age: 77 y.o. MRN: [id number]  CC:  Chief Complaint  Patient presents with   Thyroid Problem    Patient thinks her thyroid is not acting right. She is sweating constantly. Fainting feeling, sleeping longer, not eating.    HPI [name] is a 77 y.o. female with past medical history of HTN, PSVT, GERD, hypothyroidism, HLD, mood disorder, OA and obesity who presents for c/o episodes of excessive sweating, last episode in the last week.  Hypothyroidism: She has been taking levothyroxine regularly.  Her dose of levothyroxine was adjusted to 137 mcg daily as her TSH was high about 2 months ago.  She has been having excessive sweating for the last few months.  She reports chronic fatigue as well, which could be related to uncontrolled hypothyroidism.  Denies any recent change in appetite or weight.  Denies any tremors or palpitations currently.  Denies any fever, chills or LAD.  She also reports lack of appetite, hyperinsomnia along with sweating.  She states that her blood glucose has been well controlled, and denies any known episode of hypoglycemia.  HTN: BP is well-controlled. Takes medications regularly. Patient denies headache, dizziness, chest pain, dyspnea or palpitations.  Type II DM: Her last HbA1c was 6.1.  She is on metformin and has been tolerating it well.  Denies any polyuria or polyphagia currently.   Past Medical History:  Diagnosis Date   Allergic rhinitis    Arthritis    B12 deficiency    Bipolar disorder ([place])    Coronary artery disease    Depression    Diabetes mellitus ([place])    Diabetes mellitus ([place]) 10/05/2021   Essential hypertension    GERD (gastroesophageal reflux disease)    Hashimoto's thyroiditis    History of transient ischemic attack (TIA)    Or possibly migraine as well as right amaurosis fugax and ataxia - Dr. Erling Cruz   Hyperlipidemia     Hypothyroidism    PSVT (paroxysmal supraventricular tachycardia)    Right bundle branch block     Past Surgical History:  Procedure Laterality Date   CATARACT EXTRACTION W/PHACO Left 05/23/2020   Procedure: CATARACT EXTRACTION PHACO AND INTRAOCULAR LENS PLACEMENT LEFT EYE;  Surgeon: Baruch Goldmann, MD;  Location: [facility];  Service: Ophthalmology;  Laterality: Left;  left CDE=15.69   CATARACT EXTRACTION W/PHACO Right 06/13/2020   Procedure: CATARACT EXTRACTION PHACO AND INTRAOCULAR LENS PLACEMENT RIGHT EYE;  Surgeon: Baruch Goldmann, MD;  Location: [facility];  Service: Ophthalmology;  Laterality: Right;  right CDE=11.33   LAPAROSCOPIC CHOLECYSTECTOMY  2010   THYROIDECTOMY  2009   TOTAL KNEE ARTHROPLASTY Left 02/16/2021   Procedure: TOTAL KNEE ARTHROPLASTY;  Surgeon: Gaynelle Arabian, MD;  Location: [facility];  Service: Orthopedics;  Laterality: Left;    Family History  Problem Relation Age of Onset   Stroke Mother    Thyroid disease Mother    Depression Father    Hypertension Sister    Aneurysm Sister    Diabetes Brother    Heart attack Brother    Hypertension Brother    Heart failure Brother    Thyroid disease Brother    Stroke Maternal Grandmother    Heart failure Maternal Grandmother    Heart failure Maternal Grandfather    Stroke Maternal Grandfather    Heart failure Paternal Grandmother    Cancer - Colon Neg Hx     Social History  Socioeconomic History   Marital status: Widowed    Spouse name: Not on file   Number of children: 2   Years of education: 34   Highest education level: Not on file  Occupational History   Occupation: homemaker    Employer: RETIRED  Tobacco Use   Smoking status: Former    Packs/day: 0.50    Years: 10.00    Total pack years: 5.00    Types: Cigarettes    Quit date: 01/26/1983    Years since quitting: 39.2   Smokeless tobacco: Never   Tobacco comments:    quit 30 years ago  Vaping Use   Vaping Use: Never used  Substance and Sexual Activity    Alcohol use: No    Alcohol/week: 0.0 standard drinks of alcohol   Drug use: No   Sexual activity: Not Currently    Birth control/protection: Post-menopausal  Other Topics Concern   Not on file  Social History Narrative   Regular exercise: walk when able   Caffeine use: occasionally   Social Determinants of Health   Financial Resource Strain: Low Risk  (02/13/2021)   Overall Financial Resource Strain (CARDIA)    Difficulty of Paying Living Expenses: Not hard at all  Food Insecurity: No Food Insecurity (02/13/2021)   Hunger Vital Sign    Worried About Running Out of Food in the Last Year: Never true    Ran Out of Food in the Last Year: Never true  Transportation Needs: No Transportation Needs (02/13/2021)   PRAPARE - Hydrologist (Medical): No    Lack of Transportation (Non-Medical): No  Physical Activity: Inactive (02/13/2021)   Exercise Vital Sign    Days of Exercise per Week: 0 days    Minutes of Exercise per Session: 0 min  Stress: No Stress Concern Present (02/13/2021)   [place]    Feeling of Stress : Not at all  Social Connections: Moderately Isolated (02/13/2021)   Social Connection and Isolation Panel [NHANES]    Frequency of Communication with Friends and Family: More than three times a week    Frequency of Social Gatherings with Friends and Family: Never    Attends Religious Services: More than 4 times per year    Active Member of [employer] or Organizations: No    Attends Archivist Meetings: Never    Marital Status: Widowed  Intimate Partner Violence: Not At Risk (02/13/2021)   Humiliation, Afraid, Rape, and Kick questionnaire    Fear of Current or Ex-Partner: No    Emotionally Abused: No    Physically Abused: No    Sexually Abused: No    Outpatient Medications Prior to Visit  Medication Sig Dispense Refill   acetaminophen (TYLENOL) 500 MG tablet Take 1,000 mg  by mouth every 6 (six) hours as needed for moderate pain or headache.     albuterol (VENTOLIN HFA) 108 (90 Base) MCG/ACT inhaler Inhale 2 puffs into the lungs every 6 (six) hours as needed for wheezing or shortness of breath. 18 g 0   alendronate (FOSAMAX) 70 MG tablet TAKE 1 TABLET EVERY WEEK IN THE MORNING 30 MIN BEFORE EATING WITH AN 8OZ GLASS OF WATER (SIT UP 30 MIN) 4 tablet 11   atorvastatin (LIPITOR) 80 MG tablet TAKE 1 TABLET BY MOUTH ONCE A DAY. 90 tablet 1   B Complex-C (SUPER B COMPLEX PO) Take 1 tablet by mouth daily.     blood glucose meter  kit and supplies KIT Dispense based on patient and insurance preference. Use up to four times daily as directed. 1 each 0   buPROPion (WELLBUTRIN XL) 300 MG 24 hr tablet Take 300 mg by mouth daily.      calcium carbonate (TUMS - DOSED IN MG ELEMENTAL CALCIUM) 500 MG chewable tablet Chew 4 tablets by mouth daily.     Cholecalciferol (VITAMIN D3) 50 MCG (2000 UT) capsule Take 2,000 Units by mouth daily.     clonazePAM (KLONOPIN) 0.5 MG tablet Take 0.25-0.5 mg by mouth in the morning, at noon, and at bedtime.     DEXILANT 60 MG capsule Take 1 capsule (60 mg total) by mouth daily. 30 capsule 0   diltiazem (CARDIZEM CD) 240 MG 24 hr capsule TAKE (1) CAPSULE BY MOUTH ONCE DAILY. 90 capsule 3   DULoxetine (CYMBALTA) 60 MG capsule Take 60 mg by mouth 2 (two) times daily.      Lancets (ONETOUCH DELICA PLUS 123XX123) MISC USE TO TEST BLOOD SUGAR TWICE DAILY. 100 each 0   levothyroxine (SYNTHROID) 137 MCG tablet Take 1 tablet (137 mcg total) by mouth daily. 90 tablet 1   LYBALVI 5-10 MG TABS Take 1 tablet by mouth at bedtime.     methocarbamol (ROBAXIN) 500 MG tablet Take 1 tablet 3 times a day by oral route as needed.     mirabegron ER (MYRBETRIQ) 25 MG TB24 tablet Take 1 tablet (25 mg total) by mouth daily. 30 tablet 5   Multiple Vitamins-Minerals (ICAPS AREDS 2 PO) Take 1 capsule by mouth 2 (two) times daily.     Multiple Vitamins-Minerals (PRESERVISION  AREDS) TABS Take by mouth.     Na Sulfate-K Sulfate-Mg Sulf 17.5-3.13-1.6 GM/177ML SOLN As directed 354 mL 0   ondansetron (ZOFRAN) 4 MG tablet Take 1 tablet (4 mg total) by mouth every 8 (eight) hours as needed for nausea or vomiting. 20 tablet 0   ONETOUCH ULTRA test strip USE TO TEST BLOOD SUGAR TWICE DAILY. 50 each 0   saccharomyces boulardii (FLORASTOR) 250 MG capsule Take 250 mg by mouth daily.     topiramate (TOPAMAX) 50 MG tablet Take 50 mg by mouth at bedtime. (Patient not taking: Reported on 03/17/2022)     Vitamin D-Vitamin K (VITAMIN K2-VITAMIN D3 PO) Take 1 tablet by mouth daily.     losartan (COZAAR) 50 MG tablet TAKE (1) TABLET BY MOUTH TWICE DAILY 60 tablet 0   metFORMIN (GLUCOPHAGE) 500 MG tablet Take 1 tablet (500 mg total) by mouth 2 (two) times daily with a meal. 60 tablet 0   No facility-administered medications prior to visit.    Allergies  Allergen Reactions   Amoxicillin-Pot Clavulanate Rash   Atenolol Rash   Penicillins Rash    Tolerated Cephalosporin Date: 02/17/21.     Sulfonamide Derivatives Rash    ROS Review of Systems  Constitutional:  Positive for fatigue. Negative for chills and fever.  HENT:  Negative for congestion, sinus pressure and sinus pain.   Eyes:  Negative for pain and discharge.  Respiratory:  Negative for cough and shortness of breath.   Cardiovascular:  Negative for chest pain and palpitations.  Gastrointestinal:  Negative for diarrhea, nausea and vomiting.  Endocrine:       Excessive sweating  Genitourinary:  Negative for dysuria and hematuria.  Musculoskeletal:  Negative for neck pain and neck stiffness.  Skin:  Negative for rash.  Neurological:  Negative for dizziness and syncope.  Psychiatric/Behavioral:  Negative for agitation and  behavioral problems.       Objective:    Physical Exam Vitals reviewed.  Constitutional:      General: She is not in acute distress.    Appearance: She is obese. She is not diaphoretic.   HENT:     Head: Normocephalic and atraumatic.     Right Ear: There is no impacted cerumen.     Left Ear: There is no impacted cerumen.     Nose: No congestion.     Mouth/Throat:     Mouth: Mucous membranes are moist.     Pharynx: No posterior oropharyngeal erythema.  Eyes:     General: No scleral icterus.    Extraocular Movements: Extraocular movements intact.  Cardiovascular:     Rate and Rhythm: Normal rate and regular rhythm.     Pulses: Normal pulses.     Heart sounds: Normal heart sounds. No murmur heard. Pulmonary:     Breath sounds: Normal breath sounds. No wheezing or rales.  Musculoskeletal:     Cervical back: Neck supple. No tenderness.     Right lower leg: No edema.     Left lower leg: No edema.  Skin:    General: Skin is warm.     Findings: No rash.  Neurological:     General: No focal deficit present.     Mental Status: She is alert and oriented to person, place, and time.  Psychiatric:        Mood and Affect: Mood normal.        Behavior: Behavior normal.     BP 132/76 (BP Location: Left Arm, Cuff Size: Normal)   Pulse 82   Ht '5\' 2"'$  (1.575 m)   Wt 189 lb 6.4 oz (85.9 kg)   SpO2 95%   BMI 34.64 kg/m  Wt Readings from Last 3 Encounters:  03/30/22 189 lb 6.4 oz (85.9 kg)  03/17/22 192 lb 6.4 oz (87.3 kg)  02/01/22 185 lb 9.6 oz (84.2 kg)    Lab Results  Component Value Date   TSH 17.800 (H) 02/01/2022   Lab Results  Component Value Date   WBC 11.4 (H) 03/17/2022   HGB 13.4 03/17/2022   HCT 38.9 03/17/2022   MCV 94 03/17/2022   PLT 431 03/17/2022   Lab Results  Component Value Date   NA 135 02/01/2022   K 4.4 02/01/2022   CO2 25 02/01/2022   GLUCOSE 74 02/01/2022   BUN 11 02/01/2022   CREATININE 0.66 02/01/2022   BILITOT 0.4 02/01/2022   ALKPHOS 77 02/01/2022   AST 19 02/01/2022   ALT 22 02/01/2022   PROT 6.8 02/01/2022   ALBUMIN 4.6 02/01/2022   CALCIUM 9.7 02/01/2022   ANIONGAP 7 07/31/2021   EGFR 91 02/01/2022   Lab Results   Component Value Date   CHOL 149 05/26/2021   Lab Results  Component Value Date   HDL 59 05/26/2021   Lab Results  Component Value Date   LDLCALC 73 05/26/2021   Lab Results  Component Value Date   TRIG 94 05/26/2021   Lab Results  Component Value Date   CHOLHDL 2.5 05/26/2021   Lab Results  Component Value Date   HGBA1C 6.1 (H) 02/01/2022      Assessment & Plan:   Problem List Items Addressed This Visit       Cardiovascular and Mediastinum   Essential hypertension    BP Readings from Last 1 Encounters:  03/30/22 132/76  Well-controlled with Losartan 50 mg BID Counseled  for compliance with the medications Advised DASH diet and moderate exercise/walking as tolerated      Relevant Medications   losartan (COZAAR) 50 MG tablet     Endocrine   Postoperative hypothyroidism - Primary    Lab Results  Component Value Date   TSH 17.800 (H) 02/01/2022  On Levothyroxine 137 mcg QD recently, was adjusted after last check of TSH Has sweating recently, check TSH and free T4      Relevant Orders   TSH+T4F+T3Free   CMP14+EGFR   Diabetes mellitus ([place])    Lab Results  Component Value Date   HGBA1C 6.1 (H) 02/01/2022  Associated with HTN and HLD Well-controlled On Metformin Advised to check her blood glucose when she has episodes of sweating and facial flushing-could be hypoglycemia Advised to follow diabetic diet On ARB and statin F/u CMP and lipid panel Diabetic eye exam: Advised to follow up with Ophthalmology for diabetic eye exam      Relevant Medications   metFORMIN (GLUCOPHAGE) 500 MG tablet   losartan (COZAAR) 50 MG tablet     Other   Excessive sweating    Unclear etiology currently, check TSH and free T4 If thyroid function tests are WNL, may have to adjust psychiatric medications -currently on Wellbutrin 300 mg daily, Cymbalta 60 mg twice daily and Lybalvi Needs to check blood glucose when she has episode, could be hypoglycemia as well        Meds ordered this encounter  Medications   metFORMIN (GLUCOPHAGE) 500 MG tablet    Sig: Take 1 tablet (500 mg total) by mouth 2 (two) times daily with a meal.    Dispense:  60 tablet    Refill:  5   losartan (COZAAR) 50 MG tablet    Sig: Take 1 tablet (50 mg total) by mouth 2 (two) times daily.    Dispense:  60 tablet    Refill:  5    Follow-up: Return if symptoms worsen or fail to improve.    Lindell Spar, MD

## 2022-03-30 NOTE — Assessment & Plan Note (Signed)
BP Readings from Last 1 Encounters:  03/30/22 132/76   Well-controlled with Losartan 50 mg BID Counseled for compliance with the medications Advised DASH diet and moderate exercise/walking as tolerated

## 2022-03-30 NOTE — Assessment & Plan Note (Signed)
Lab Results  Component Value Date   TSH 17.800 (H) 02/01/2022   On Levothyroxine 137 mcg QD recently, was adjusted after last check of TSH Has sweating recently, check TSH and free T4

## 2022-03-31 ENCOUNTER — Encounter: Payer: Self-pay | Admitting: *Deleted

## 2022-03-31 ENCOUNTER — Telehealth: Payer: Self-pay | Admitting: Internal Medicine

## 2022-03-31 NOTE — Telephone Encounter (Signed)
Pt called back with some questions about her labs

## 2022-03-31 NOTE — Telephone Encounter (Signed)
Pt aware- went over what to do in case of BS under 80. She said if you think she would benefit from seeing dr nida for her thyroid that she was willing to go. She just wants the excessive sweating to stop.

## 2022-03-31 NOTE — Telephone Encounter (Signed)
Patient advised.

## 2022-04-01 DIAGNOSIS — D485 Neoplasm of uncertain behavior of skin: Secondary | ICD-10-CM | POA: Diagnosis not present

## 2022-04-01 DIAGNOSIS — L568 Other specified acute skin changes due to ultraviolet radiation: Secondary | ICD-10-CM | POA: Diagnosis not present

## 2022-04-01 DIAGNOSIS — L658 Other specified nonscarring hair loss: Secondary | ICD-10-CM | POA: Diagnosis not present

## 2022-04-01 DIAGNOSIS — F17200 Nicotine dependence, unspecified, uncomplicated: Secondary | ICD-10-CM | POA: Diagnosis not present

## 2022-04-01 DIAGNOSIS — Z1283 Encounter for screening for malignant neoplasm of skin: Secondary | ICD-10-CM | POA: Diagnosis not present

## 2022-04-01 LAB — CMP14+EGFR
ALT: 26 IU/L (ref 0–32)
AST: 22 IU/L (ref 0–40)
Albumin/Globulin Ratio: 1.9 (ref 1.2–2.2)
Albumin: 4.4 g/dL (ref 3.8–4.8)
Alkaline Phosphatase: 72 IU/L (ref 44–121)
BUN/Creatinine Ratio: 14 (ref 12–28)
BUN: 10 mg/dL (ref 8–27)
Bilirubin Total: 0.4 mg/dL (ref 0.0–1.2)
CO2: 25 mmol/L (ref 20–29)
Calcium: 9.9 mg/dL (ref 8.7–10.3)
Chloride: 97 mmol/L (ref 96–106)
Creatinine, Ser: 0.72 mg/dL (ref 0.57–1.00)
Globulin, Total: 2.3 g/dL (ref 1.5–4.5)
Glucose: 93 mg/dL (ref 70–99)
Potassium: 4.7 mmol/L (ref 3.5–5.2)
Sodium: 137 mmol/L (ref 134–144)
Total Protein: 6.7 g/dL (ref 6.0–8.5)
eGFR: 87 mL/min/{1.73_m2} (ref >59)

## 2022-04-01 LAB — TSH+T4F+T3FREE
Free T4: 1.45 ng/dL (ref 0.82–1.77)
T3, Free: 2.8 pg/mL (ref 2.0–4.4)
TSH: 8.64 u[IU]/mL — ABNORMAL HIGH (ref 0.450–4.500)

## 2022-04-12 ENCOUNTER — Ambulatory Visit: Payer: PPO | Admitting: Dermatology

## 2022-04-13 ENCOUNTER — Encounter: Payer: Self-pay | Admitting: Internal Medicine

## 2022-04-19 ENCOUNTER — Other Ambulatory Visit: Payer: Self-pay

## 2022-04-19 ENCOUNTER — Encounter (HOSPITAL_COMMUNITY): Payer: Self-pay

## 2022-04-19 ENCOUNTER — Encounter (HOSPITAL_COMMUNITY)
Admission: RE | Admit: 2022-04-19 | Discharge: 2022-04-19 | Disposition: A | Discharge status: Home / Self Care | Payer: PPO | Source: Ambulatory Visit | Attending: Internal Medicine | Admitting: Internal Medicine

## 2022-04-20 ENCOUNTER — Encounter (HOSPITAL_COMMUNITY): Payer: Self-pay | Admitting: Anesthesiology

## 2022-04-21 ENCOUNTER — Telehealth: Payer: Self-pay | Admitting: *Deleted

## 2022-04-21 ENCOUNTER — Other Ambulatory Visit: Payer: Self-pay

## 2022-04-21 ENCOUNTER — Ambulatory Visit (HOSPITAL_COMMUNITY)
Admission: RE | Admit: 2022-04-21 | Discharge: 2022-04-21 | Disposition: A | Discharge status: Other Acute Inpatient Hospital | Payer: PPO | Source: Ambulatory Visit | Attending: Internal Medicine | Admitting: Internal Medicine

## 2022-04-21 ENCOUNTER — Emergency Department (HOSPITAL_COMMUNITY)
Admission: EM | Admit: 2022-04-21 | Discharge: 2022-04-21 | Disposition: A | Discharge status: Home / Self Care | Payer: PPO | Source: Home / Self Care | Attending: Student | Admitting: Student

## 2022-04-21 ENCOUNTER — Encounter (HOSPITAL_COMMUNITY): Admission: RE | Payer: Self-pay | Source: Ambulatory Visit | Attending: Internal Medicine

## 2022-04-21 ENCOUNTER — Encounter (HOSPITAL_COMMUNITY): Payer: Self-pay | Admitting: *Deleted

## 2022-04-21 DIAGNOSIS — Z79899 Other long term (current) drug therapy: Secondary | ICD-10-CM | POA: Insufficient documentation

## 2022-04-21 DIAGNOSIS — E119 Type 2 diabetes mellitus without complications: Secondary | ICD-10-CM | POA: Insufficient documentation

## 2022-04-21 DIAGNOSIS — E039 Hypothyroidism, unspecified: Secondary | ICD-10-CM | POA: Insufficient documentation

## 2022-04-21 DIAGNOSIS — R195 Other fecal abnormalities: Secondary | ICD-10-CM

## 2022-04-21 DIAGNOSIS — I10 Essential (primary) hypertension: Secondary | ICD-10-CM | POA: Insufficient documentation

## 2022-04-21 DIAGNOSIS — E876 Hypokalemia: Secondary | ICD-10-CM | POA: Insufficient documentation

## 2022-04-21 DIAGNOSIS — I4891 Unspecified atrial fibrillation: Secondary | ICD-10-CM | POA: Insufficient documentation

## 2022-04-21 DIAGNOSIS — E860 Dehydration: Secondary | ICD-10-CM | POA: Insufficient documentation

## 2022-04-21 DIAGNOSIS — Z7984 Long term (current) use of oral hypoglycemic drugs: Secondary | ICD-10-CM | POA: Insufficient documentation

## 2022-04-21 DIAGNOSIS — Z539 Procedure and treatment not carried out, unspecified reason: Secondary | ICD-10-CM | POA: Diagnosis not present

## 2022-04-21 DIAGNOSIS — F319 Bipolar disorder, unspecified: Secondary | ICD-10-CM | POA: Insufficient documentation

## 2022-04-21 DIAGNOSIS — K580 Irritable bowel syndrome with diarrhea: Secondary | ICD-10-CM | POA: Insufficient documentation

## 2022-04-21 DIAGNOSIS — I4892 Unspecified atrial flutter: Secondary | ICD-10-CM | POA: Diagnosis not present

## 2022-04-21 DIAGNOSIS — I483 Typical atrial flutter: Secondary | ICD-10-CM | POA: Insufficient documentation

## 2022-04-21 DIAGNOSIS — I4719 Other supraventricular tachycardia: Secondary | ICD-10-CM | POA: Diagnosis not present

## 2022-04-21 DIAGNOSIS — I499 Cardiac arrhythmia, unspecified: Secondary | ICD-10-CM | POA: Diagnosis present

## 2022-04-21 DIAGNOSIS — I4510 Unspecified right bundle-branch block: Secondary | ICD-10-CM | POA: Insufficient documentation

## 2022-04-21 LAB — CBC WITH DIFFERENTIAL/PLATELET
Abs Immature Granulocytes: 0.03 10*3/uL (ref 0.00–0.07)
Basophils Absolute: 0 10*3/uL (ref 0.0–0.1)
Basophils Relative: 0 %
Eosinophils Absolute: 0 10*3/uL (ref 0.0–0.5)
Eosinophils Relative: 0 %
HCT: 41.2 % (ref 36.0–46.0)
Hemoglobin: 14.5 g/dL (ref 12.0–15.0)
Immature Granulocytes: 0 %
Lymphocytes Relative: 17 %
Lymphs Abs: 1.7 10*3/uL (ref 0.7–4.0)
MCH: 33.3 pg (ref 26.0–34.0)
MCHC: 35.2 g/dL (ref 30.0–36.0)
MCV: 94.7 fL (ref 80.0–100.0)
Monocytes Absolute: 1.1 10*3/uL — ABNORMAL HIGH (ref 0.1–1.0)
Monocytes Relative: 10 %
Neutro Abs: 7.5 10*3/uL (ref 1.7–7.7)
Neutrophils Relative %: 73 %
Platelets: 376 10*3/uL (ref 150–400)
RBC: 4.35 MIL/uL (ref 3.87–5.11)
RDW: 12.6 % (ref 11.5–15.5)
WBC: 10.5 10*3/uL (ref 4.0–10.5)
nRBC: 0 % (ref 0.0–0.2)

## 2022-04-21 LAB — COMPREHENSIVE METABOLIC PANEL
ALT: 41 U/L (ref 0–44)
AST: 33 U/L (ref 15–41)
Albumin: 4.5 g/dL (ref 3.5–5.0)
Alkaline Phosphatase: 79 U/L (ref 38–126)
Anion gap: 11 (ref 5–15)
BUN: 6 mg/dL — ABNORMAL LOW (ref 8–23)
CO2: 26 mmol/L (ref 22–32)
Calcium: 8.7 mg/dL — ABNORMAL LOW (ref 8.9–10.3)
Chloride: 97 mmol/L — ABNORMAL LOW (ref 98–111)
Creatinine, Ser: 0.59 mg/dL (ref 0.44–1.00)
GFR, Estimated: >60 mL/min (ref >60)
Glucose, Bld: 113 mg/dL — ABNORMAL HIGH (ref 70–99)
Potassium: 3.3 mmol/L — ABNORMAL LOW (ref 3.5–5.1)
Sodium: 134 mmol/L — ABNORMAL LOW (ref 135–145)
Total Bilirubin: 0.8 mg/dL (ref 0.3–1.2)
Total Protein: 7.7 g/dL (ref 6.5–8.1)

## 2022-04-21 LAB — GLUCOSE, CAPILLARY: Glucose-Capillary: 136 mg/dL — ABNORMAL HIGH (ref 70–99)

## 2022-04-21 SURGERY — COLONOSCOPY WITH PROPOFOL
Anesthesia: Monitor Anesthesia Care

## 2022-04-21 MED ORDER — DILTIAZEM HCL 25 MG/5ML IV SOLN: 10.0000 mg | Freq: Once | INTRAVENOUS | Status: DC

## 2022-04-21 MED ORDER — DILTIAZEM HCL-DEXTROSE 125-5 MG/125ML-% IV SOLN (PREMIX): 5.0000 mg/h | INTRAVENOUS | Status: DC

## 2022-04-21 MED ORDER — DILTIAZEM LOAD VIA INFUSION
10.0000 mg | Freq: Once | INTRAVENOUS | Status: DC
  Filled 2022-04-21: qty 10

## 2022-04-21 MED ORDER — DILTIAZEM HCL 50 MG/10ML IV SOLN
10.0000 mg | Freq: Once | INTRAVENOUS | Status: AC
  Administered 2022-04-21: 10 mg via INTRAVENOUS

## 2022-04-21 MED ORDER — DILTIAZEM HCL ER COATED BEADS 240 MG PO CP24
240.0000 mg | ORAL_CAPSULE | Freq: Once | ORAL | Status: AC
  Administered 2022-04-21: 240 mg via ORAL
  Filled 2022-04-21: qty 1

## 2022-04-21 MED ORDER — DILTIAZEM HCL 25 MG/5ML IV SOLN
5.0000 mg | Freq: Once | INTRAVENOUS | Status: DC
  Filled 2022-04-21: qty 5

## 2022-04-21 MED ORDER — MAGNESIUM OXIDE -MG SUPPLEMENT 400 (240 MG) MG PO TABS
800.0000 mg | ORAL_TABLET | Freq: Once | ORAL | Status: AC
  Administered 2022-04-21: 800 mg via ORAL
  Filled 2022-04-21: qty 2

## 2022-04-21 MED ORDER — LACTATED RINGERS IV BOLUS
1000.0000 mL | Freq: Once | INTRAVENOUS | Status: AC
  Administered 2022-04-21: 1000 mL via INTRAVENOUS

## 2022-04-21 MED ORDER — POTASSIUM CHLORIDE CRYS ER 20 MEQ PO TBCR
40.0000 meq | EXTENDED_RELEASE_TABLET | Freq: Once | ORAL | Status: AC
  Administered 2022-04-21: 40 meq via ORAL
  Filled 2022-04-21: qty 2

## 2022-04-21 MED ORDER — LACTATED RINGERS IV SOLN: INTRAVENOUS | Status: DC

## 2022-04-21 MED ORDER — DILTIAZEM HCL 25 MG/5ML IV SOLN
10.0000 mg | Freq: Once | INTRAVENOUS | Status: AC
  Administered 2022-04-21: 10 mg via INTRAVENOUS
  Filled 2022-04-21: qty 5

## 2022-04-21 NOTE — Telephone Encounter (Signed)
Pts son called and asked if pt needed to take another diltiazem before her procedure tomorrow. Pt had procedure scheduled for today 04/21/22 and while procedure was being preformed she went into A-fib so procedure was not preformed. She is having the procedure tomorrow 04/22/22. I advised him to follow the directions on how provider prescribed the medication. Verbalized understanding.

## 2022-04-21 NOTE — ED Provider Notes (Signed)
[facility] Provider Note  CSN: [id number] Arrival date & time: 04/21/22 [id number]  Chief Complaint(s) Irregular Heart Beat  HPI [name] is a 77 y.o. female with PMH bipolar disorder, T2DM, Hashimoto's, colitis who presents emergency department for evaluation of rapid irregular heartbeat.  Patient was scheduled for colonoscopy/EGD this morning where she was found to be in rapid A-fib with RVR.  Patient states that she has been having copious watery diarrhea in the setting of her colonoscopy prep but currently is asymptomatic.  Denies chest pain, shortness of breath, abdominal pain, nausea, vomiting or other systemic symptoms.  Of note, patient is on daily oral diltiazem for paroxysmal SVT but did not take this medication of the last 48 hours due to her taking her bowel prep.   Past Medical History Past Medical History:  Diagnosis Date   Allergic rhinitis    Arthritis    B12 deficiency    Bipolar disorder ([place])    Coronary artery disease    Depression    Diabetes mellitus ([place])    Diabetes mellitus ([place]) 10/05/2021   Essential hypertension    GERD (gastroesophageal reflux disease)    Hashimoto's thyroiditis    History of transient ischemic attack (TIA)    Or possibly migraine as well as right amaurosis fugax and ataxia - Dr. Erling Cruz   Hyperlipidemia    Hypothyroidism    PSVT (paroxysmal supraventricular tachycardia)    Right bundle branch block    Patient Active Problem List   Diagnosis Date Noted   Positive colorectal cancer screening using Cologuard test 03/17/2022   Excessive sweating 02/02/2022   Age-related osteoporosis without current pathological fracture 02/01/2022   Acute bronchitis 01/22/2022   Macular degeneration 10/12/2021   Diabetes mellitus ([place]) 10/05/2021   Encounter for general adult medical examination with abnormal findings 10/05/2021   Bilateral impacted cerumen 10/05/2021   Onychoschizia 07/01/2021    Alopecia 05/26/2021   Hot flashes 05/26/2021   Menopause 05/26/2021   Morbid obesity ([place]) 05/26/2021   Allergic sinusitis 05/26/2021   Primary osteoarthritis of left knee 02/16/2021   Cognitive decline 01/14/2021   Breast pain, left 09/30/2020   Urge incontinence of urine 08/19/2020   Constipation 06/09/2020   Hearing loss 06/09/2020   Irritable bowel syndrome 06/09/2020   Vitamin D deficiency 06/09/2020   B12 deficiency anemia 04/07/2020   Bipolar disorder ([place]) 04/07/2020   Anxiety 04/07/2020   Gastroesophageal reflux disease 04/07/2020   Iron deficiency anemia 04/07/2020   Osteoarthritis of knee 09/12/2017   Postoperative hypothyroidism 12/08/2012   PSVT (paroxysmal supraventricular tachycardia) 03/12/2011   Exertional dyspnea 02/05/2011   Hyperlipidemia 11/06/2009   Essential hypertension 11/06/2009   Home Medication(s) Prior to Admission medications   Medication Sig Start Date End Date Taking? Authorizing Provider  acetaminophen (TYLENOL) 500 MG tablet Take 1,000 mg by mouth every 6 (six) hours as needed for moderate pain or headache.   Yes [provider]  albuterol (VENTOLIN HFA) 108 (90 Base) MCG/ACT inhaler Inhale 2 puffs into the lungs every 6 (six) hours as needed for wheezing or shortness of breath. 09/02/20  Yes Noreene Larsson, NP  alendronate (FOSAMAX) 70 MG tablet TAKE 1 TABLET EVERY WEEK IN THE MORNING 30 MIN BEFORE EATING WITH AN 8OZ GLASS OF WATER (SIT UP 30 MIN) 02/01/22  Yes Lindell Spar, MD  atorvastatin (LIPITOR) 80 MG tablet TAKE 1 TABLET BY MOUTH ONCE A DAY. 12/14/21  Yes Imogene Burn, PA-C  buPROPion [facility]  XL) 300 MG 24 hr tablet Take 300 mg by mouth daily.  02/08/14  Yes [provider]  clonazePAM (KLONOPIN) 0.5 MG tablet Take 0.25-0.5 mg by mouth in the morning, at noon, and at bedtime.   Yes   DEXILANT 60 MG capsule Take 1 capsule (60 mg total) by mouth daily. 01/08/22  Yes Patel, Colin Broach, MD  diltiazem (CARDIZEM CD) 240 MG  24 hr capsule TAKE (1) CAPSULE BY MOUTH ONCE DAILY. 08/11/21  Yes Satira Sark, MD  DULoxetine (CYMBALTA) 60 MG capsule Take 60 mg by mouth 2 (two) times daily.  01/29/14  Yes [provider]  levothyroxine (SYNTHROID) 137 MCG tablet Take 1 tablet (137 mcg total) by mouth daily. Patient taking differently: Take 137 mcg by mouth daily. 02/03/22  Yes Lindell Spar, MD  losartan (COZAAR) 50 MG tablet Take 1 tablet (50 mg total) by mouth 2 (two) times daily. 03/30/22  Yes Patel, Colin Broach, MD  LYBALVI 5-10 MG TABS Take 1 tablet by mouth at bedtime. 03/10/20  Yes [provider]  metFORMIN (GLUCOPHAGE) 500 MG tablet Take 1 tablet (500 mg total) by mouth 2 (two) times daily with a meal. 03/30/22  Yes Lindell Spar, MD  mirabegron ER (MYRBETRIQ) 25 MG TB24 tablet Take 1 tablet (25 mg total) by mouth daily. 02/01/22  Yes Lindell Spar, MD  Multiple Vitamins-Minerals (ICAPS AREDS 2 PO) Take 1 capsule by mouth 2 (two) times daily.   Yes [provider]  pantoprazole (PROTONIX) 40 MG tablet Take 40 mg by mouth daily.   Yes [provider]  blood glucose meter kit and supplies KIT Dispense based on patient and insurance preference. Use up to four times daily as directed. 02/01/22   Lindell Spar, MD  Lancets (ONETOUCH DELICA PLUS [id number]) MISC USE TO TEST BLOOD SUGAR TWICE DAILY. 07/31/21   Lindell Spar, MD  Na Sulfate-K Sulfate-Mg Sulf 17.5-3.13-1.6 GM/177ML SOLN As directed Patient not taking: Reported on 04/21/2022 03/17/22   Daneil Dolin, MD  ondansetron (ZOFRAN) 4 MG tablet Take 1 tablet (4 mg total) by mouth every 8 (eight) hours as needed for nausea or vomiting. Patient not taking: Reported on 04/14/2022 06/11/21   Lindell Spar, MD  [facility] ULTRA test strip USE TO TEST BLOOD SUGAR TWICE DAILY. 02/15/22   Lindell Spar, MD                                                                                                                                    Past  Surgical History Past Surgical History:  Procedure Laterality Date   CATARACT EXTRACTION W/PHACO Left 05/23/2020   Procedure: CATARACT EXTRACTION PHACO AND INTRAOCULAR LENS PLACEMENT LEFT EYE;  Surgeon: Baruch Goldmann, MD;  Location: [facility];  Service: Ophthalmology;  Laterality: Left;  left CDE=15.69   CATARACT EXTRACTION W/PHACO Right 06/13/2020   Procedure: CATARACT EXTRACTION PHACO AND INTRAOCULAR LENS  PLACEMENT RIGHT EYE;  Surgeon: Baruch Goldmann, MD;  Location: [facility];  Service: Ophthalmology;  Laterality: Right;  right CDE=11.33   LAPAROSCOPIC CHOLECYSTECTOMY  2010   THYROIDECTOMY  2009   TOTAL KNEE ARTHROPLASTY Left 02/16/2021   Procedure: TOTAL KNEE ARTHROPLASTY;  Surgeon: Gaynelle Arabian, MD;  Location: [facility];  Service: Orthopedics;  Laterality: Left;   Family History Family History  Problem Relation Age of Onset   Stroke Mother    Thyroid disease Mother    Depression Father    Hypertension Sister    Aneurysm Sister    Diabetes Brother    Heart attack Brother    Hypertension Brother    Heart failure Brother    Thyroid disease Brother    Stroke Maternal Grandmother    Heart failure Maternal Grandmother    Heart failure Maternal Grandfather    Stroke Maternal Grandfather    Heart failure Paternal Grandmother    Cancer - Colon Neg Hx     Social History Social History   Tobacco Use   Smoking status: Former    Packs/day: 0.50    Years: 10.00    Additional pack years: 0.00    Total pack years: 5.00    Types: Cigarettes    Quit date: 01/26/1983    Years since quitting: 39.2   Smokeless tobacco: Never   Tobacco comments:    quit 30 years ago  Vaping Use   Vaping Use: Never used  Substance Use Topics   Alcohol use: No    Alcohol/week: 0.0 standard drinks of alcohol   Drug use: No   Allergies Amoxicillin-pot clavulanate, Atenolol, Penicillins, and Sulfonamide derivatives  Review of Systems Review of Systems  Cardiovascular:  Positive for palpitations.     Physical Exam Vital Signs  I have reviewed the triage vital signs BP (!) 140/59   Pulse 71   Temp 98.1 F (36.7 C) (Oral)   Resp 18   Ht 5\' 2"  (1.575 m)   Wt 90.7 kg   SpO2 100%   BMI 36.58 kg/m   Physical Exam Vitals and nursing note reviewed.  Constitutional:      General: She is not in acute distress.    Appearance: She is well-developed.  HENT:     Head: Normocephalic and atraumatic.  Eyes:     Conjunctiva/sclera: Conjunctivae normal.  Cardiovascular:     Rate and Rhythm: Tachycardia present. Rhythm irregular.     Heart sounds: No murmur heard. Pulmonary:     Effort: Pulmonary effort is normal. No respiratory distress.     Breath sounds: Normal breath sounds.  Abdominal:     Palpations: Abdomen is soft.     Tenderness: There is no abdominal tenderness.  Musculoskeletal:        General: No swelling.     Cervical back: Neck supple.  Skin:    General: Skin is warm and dry.     Capillary Refill: Capillary refill takes less than 2 seconds.  Neurological:     Mental Status: She is alert.  Psychiatric:        Mood and Affect: Mood normal.     ED Results and Treatments Labs (all labs ordered are listed, but only abnormal results are displayed) Labs Reviewed  COMPREHENSIVE METABOLIC PANEL - Abnormal; Notable for the following components:      Result Value   Sodium 134 (*)    Potassium 3.3 (*)    Chloride 97 (*)    Glucose, Bld 113 (*)  BUN 6 (*)    Calcium 8.7 (*)    All other components within normal limits  CBC WITH DIFFERENTIAL/PLATELET - Abnormal; Notable for the following components:   Monocytes Absolute 1.1 (*)    All other components within normal limits  CBC WITH DIFFERENTIAL/PLATELET                                                                                                                          Radiology No results found.  Pertinent labs & imaging results that were available during my care of the patient were reviewed by me and  considered in my medical decision making (see MDM for details).  Medications Ordered in ED Medications  diltiazem (CARDIZEM) 1 mg/mL load via infusion 10 mg (has no administration in time range)    And  diltiazem (CARDIZEM) 125 mg in dextrose 5% 125 mL (1 mg/mL) infusion (has no administration in time range)  diltiazem (CARDIZEM CD) 24 hr capsule 240 mg (has no administration in time range)  lactated ringers bolus 1,000 mL (1,000 mLs Intravenous New Bag/Given 04/21/22 0944)  diltiazem (CARDIZEM) injection 10 mg (10 mg Intravenous Given 04/21/22 0949)  potassium chloride SA (KLOR-CON M) CR tablet 40 mEq (40 mEq Oral Given 04/21/22 1102)  magnesium oxide (MAG-OX) tablet 800 mg (800 mg Oral Given 04/21/22 1104)  diltiazem (CARDIZEM) injection SOLN 10 mg (10 mg Intravenous Given 04/21/22 1109)                                                                                                                                     Procedures .Critical Care  Performed by: Teressa Lower, MD Authorized by: Teressa Lower, MD   Critical care provider statement:    Critical care time (minutes):  30   Critical care was necessary to treat or prevent imminent or life-threatening deterioration of the following conditions:  Cardiac failure and dehydration   Critical care was time spent personally by me on the following activities:  Development of treatment plan with patient or surrogate, discussions with consultants, evaluation of patient's response to treatment, examination of patient, ordering and review of laboratory studies, ordering and review of radiographic studies, ordering and performing treatments and interventions, pulse oximetry, re-evaluation of patient's condition and review of old charts   (including critical care time)  Medical Decision Making / ED Course   This patient presents to the ED  for concern of rapid heart rate, this involves an extensive number of treatment options, and is a  complaint that carries with it a high risk of complications and morbidity.  The differential diagnosis includes atrial fibrillation, a flutter, medication noncompliance, dehydration, electrolyte abnormality  MDM: Patient seen emergency department for evaluation of rapid heart rate.  Physical exam is largely unremarkable outside of an irregular tachycardia.  Initial ECG with a flutter.  Laboratory evaluation with potassium 3.3 which was repleted in the emergency department, mild hyponatremia 134 but is otherwise unremarkable.  Patient given 2 L lactated Ringer's and to 10 mg pushes of diltiazem leading to conversion to normal sinus rhythm.  I spoke with Dr. Gala Romney of gastroenterology and the attending anesthesiologist on-call who came down evaluate the patient at bedside.  We agreed on a plan that the patient will be discharged after obtaining an additional oral dose of diltiazem today and patient will take her diltiazem tomorrow and return to the preop suite at 6:30 in the morning to have her colonoscopy performed tomorrow.  On reevaluation patient remains in normal sinus rhythm and was then discharged with plans to return tomorrow morning for colonoscopy.   Additional history obtained: -Additional history obtained from son -External records from outside source obtained and reviewed including: Chart review including previous notes, labs, imaging, consultation notes   Lab Tests: -I ordered, reviewed, and interpreted labs.   The pertinent results include:   Labs Reviewed  COMPREHENSIVE METABOLIC PANEL - Abnormal; Notable for the following components:      Result Value   Sodium 134 (*)    Potassium 3.3 (*)    Chloride 97 (*)    Glucose, Bld 113 (*)    BUN 6 (*)    Calcium 8.7 (*)    All other components within normal limits  CBC WITH DIFFERENTIAL/PLATELET - Abnormal; Notable for the following components:   Monocytes Absolute 1.1 (*)    All other components within normal limits  CBC WITH  DIFFERENTIAL/PLATELET      EKG   Atrial flutter, repeat EKG with normal sinus rhythm    Medicines ordered and prescription drug management: Meds ordered this encounter  Medications   lactated ringers bolus 1,000 mL   diltiazem (CARDIZEM) injection 10 mg   DISCONTD: diltiazem (CARDIZEM) injection 5 mg   potassium chloride SA (KLOR-CON M) CR tablet 40 mEq   magnesium oxide (MAG-OX) tablet 800 mg   DISCONTD: diltiazem (CARDIZEM) injection 10 mg   AND Linked Order Group    diltiazem (CARDIZEM) 1 mg/mL load via infusion 10 mg    diltiazem (CARDIZEM) 125 mg in dextrose 5% 125 mL (1 mg/mL) infusion   diltiazem (CARDIZEM) injection SOLN 10 mg   diltiazem (CARDIZEM CD) 24 hr capsule 240 mg    -I have reviewed the patients home medicines and have made adjustments as needed  Critical interventions Fluid resuscitation, diltiazem pushes  Consultations Obtained: I requested consultation with the gastroenterologist on-call and anesthesiologist,  and discussed lab and imaging findings as well as pertinent plan - they recommend: Oral Dilt and return to the preop suite tomorrow for colonoscopy   Cardiac Monitoring: The patient was maintained on a cardiac monitor.  I personally viewed and interpreted the cardiac monitored which showed an underlying rhythm of: A flutter with RVR, normal sinus rhythm  Social Determinants of Health:  Factors impacting patients care include: none   Reevaluation: After the interventions noted above, I reevaluated the patient and found that they have :improved  Co morbidities that complicate the patient evaluation  Past Medical History:  Diagnosis Date   Allergic rhinitis    Arthritis    B12 deficiency    Bipolar disorder ([place])    Coronary artery disease    Depression    Diabetes mellitus ([place])    Diabetes mellitus ([place]) 10/05/2021   Essential hypertension    GERD (gastroesophageal reflux disease)    Hashimoto's thyroiditis    History of transient  ischemic attack (TIA)    Or possibly migraine as well as right amaurosis fugax and ataxia - Dr. Erling Cruz   Hyperlipidemia    Hypothyroidism    PSVT (paroxysmal supraventricular tachycardia)    Right bundle branch block       Dispostion: I considered admission for this patient, but at this time patient safe for discharge with return to the preop suite tomorrow after clear liquid diet for colonoscopy     Final Clinical Impression(s) / ED Diagnoses Final diagnoses:  Typical atrial flutter ([place])  Dehydration     @PCDICTATION @    Teressa Lower, MD 04/21/22 [phone]

## 2022-04-21 NOTE — Progress Notes (Signed)
Dr Charna Elizabeth viewed. EKG and talked with pt. Procedure canceled. Instructed to transfer pt to ER for further evaluation. Talked with Meyer Russel RN in ER. Report given.

## 2022-04-21 NOTE — ED Triage Notes (Signed)
Pt was in pre-op this morning to have an Endoscopy done. When she woke up this morning she was diaphoretic, but denies having any chest pain or SOB. When she arrived to pre-op pt reports the pre-op nurse noticed she was diaphoretic and she started to feel like her heart was racing. EKG was done and showed A. Fib, which is new for her per pre-op nurse. Pt denies any SOB or chest pain at this time.

## 2022-04-21 NOTE — Progress Notes (Signed)
Awake. Denies pain and SOB. Diaphoretic. A-fib on monitor. EKG done.

## 2022-04-21 NOTE — Progress Notes (Signed)
Pt up date given to son, [name]. Pt transferred to ER via w/c in good condition. Son at side. Clothes at side.  Report to [facility] EMT.

## 2022-04-22 ENCOUNTER — Ambulatory Visit (HOSPITAL_BASED_OUTPATIENT_CLINIC_OR_DEPARTMENT_OTHER): Payer: PPO | Admitting: Anesthesiology

## 2022-04-22 ENCOUNTER — Encounter (HOSPITAL_COMMUNITY)
Admission: RE | Disposition: A | Discharge status: Home / Self Care | Payer: Self-pay | Source: Ambulatory Visit | Attending: Internal Medicine

## 2022-04-22 ENCOUNTER — Encounter (HOSPITAL_COMMUNITY): Payer: Self-pay | Admitting: Internal Medicine

## 2022-04-22 ENCOUNTER — Other Ambulatory Visit: Payer: Self-pay

## 2022-04-22 ENCOUNTER — Ambulatory Visit (HOSPITAL_COMMUNITY)
Admission: RE | Admit: 2022-04-22 | Discharge: 2022-04-22 | Disposition: A | Discharge status: Home / Self Care | Payer: PPO | Source: Ambulatory Visit | Attending: Internal Medicine | Admitting: Internal Medicine

## 2022-04-22 ENCOUNTER — Ambulatory Visit (HOSPITAL_COMMUNITY): Payer: PPO | Admitting: Anesthesiology

## 2022-04-22 DIAGNOSIS — R195 Other fecal abnormalities: Secondary | ICD-10-CM | POA: Diagnosis not present

## 2022-04-22 DIAGNOSIS — Q438 Other specified congenital malformations of intestine: Secondary | ICD-10-CM | POA: Insufficient documentation

## 2022-04-22 DIAGNOSIS — Z7984 Long term (current) use of oral hypoglycemic drugs: Secondary | ICD-10-CM | POA: Insufficient documentation

## 2022-04-22 DIAGNOSIS — Z833 Family history of diabetes mellitus: Secondary | ICD-10-CM | POA: Insufficient documentation

## 2022-04-22 DIAGNOSIS — I2510 Atherosclerotic heart disease of native coronary artery without angina pectoris: Secondary | ICD-10-CM | POA: Diagnosis not present

## 2022-04-22 DIAGNOSIS — Z87891 Personal history of nicotine dependence: Secondary | ICD-10-CM | POA: Insufficient documentation

## 2022-04-22 DIAGNOSIS — E890 Postprocedural hypothyroidism: Secondary | ICD-10-CM | POA: Diagnosis not present

## 2022-04-22 DIAGNOSIS — K635 Polyp of colon: Secondary | ICD-10-CM | POA: Diagnosis not present

## 2022-04-22 DIAGNOSIS — I10 Essential (primary) hypertension: Secondary | ICD-10-CM | POA: Insufficient documentation

## 2022-04-22 DIAGNOSIS — E119 Type 2 diabetes mellitus without complications: Secondary | ICD-10-CM | POA: Insufficient documentation

## 2022-04-22 DIAGNOSIS — K219 Gastro-esophageal reflux disease without esophagitis: Secondary | ICD-10-CM | POA: Diagnosis not present

## 2022-04-22 DIAGNOSIS — Z8249 Family history of ischemic heart disease and other diseases of the circulatory system: Secondary | ICD-10-CM | POA: Insufficient documentation

## 2022-04-22 DIAGNOSIS — K5730 Diverticulosis of large intestine without perforation or abscess without bleeding: Secondary | ICD-10-CM | POA: Insufficient documentation

## 2022-04-22 DIAGNOSIS — Z1211 Encounter for screening for malignant neoplasm of colon: Secondary | ICD-10-CM | POA: Diagnosis not present

## 2022-04-22 DIAGNOSIS — I4891 Unspecified atrial fibrillation: Secondary | ICD-10-CM | POA: Diagnosis not present

## 2022-04-22 DIAGNOSIS — D123 Benign neoplasm of transverse colon: Secondary | ICD-10-CM | POA: Insufficient documentation

## 2022-04-22 HISTORY — PX: POLYPECTOMY: SHX149

## 2022-04-22 HISTORY — PX: COLONOSCOPY WITH PROPOFOL: SHX5780

## 2022-04-22 LAB — GLUCOSE, CAPILLARY: Glucose-Capillary: 120 mg/dL — ABNORMAL HIGH (ref 70–99)

## 2022-04-22 SURGERY — COLONOSCOPY WITH PROPOFOL
Anesthesia: General

## 2022-04-22 SURGERY — COLONOSCOPY WITH PROPOFOL
Anesthesia: Monitor Anesthesia Care

## 2022-04-22 MED ORDER — LACTATED RINGERS IV SOLN: INTRAVENOUS | Status: DC | PRN

## 2022-04-22 MED ORDER — PHENYLEPHRINE 80 MCG/ML (10ML) SYRINGE FOR IV PUSH (FOR BLOOD PRESSURE SUPPORT)
PREFILLED_SYRINGE | INTRAVENOUS | Status: DC | PRN
  Administered 2022-04-22: 160 ug via INTRAVENOUS

## 2022-04-22 MED ORDER — LIDOCAINE HCL (CARDIAC) PF 100 MG/5ML IV SOSY
PREFILLED_SYRINGE | INTRAVENOUS | Status: DC | PRN
  Administered 2022-04-22: 50 mg via INTRATRACHEAL

## 2022-04-22 MED ORDER — PROPOFOL 500 MG/50ML IV EMUL
INTRAVENOUS | Status: DC | PRN
  Administered 2022-04-22: 200 ug/kg/min via INTRAVENOUS

## 2022-04-22 MED ORDER — PROPOFOL 10 MG/ML IV BOLUS
INTRAVENOUS | Status: DC | PRN
  Administered 2022-04-22: 90 mg via INTRAVENOUS

## 2022-04-22 NOTE — Anesthesia Postprocedure Evaluation (Signed)
Anesthesia Post Note  Patient: [name]  Procedure(s) Performed: COLONOSCOPY WITH PROPOFOL POLYPECTOMY INTESTINAL  Patient location during evaluation: Phase II Anesthesia Type: General Level of consciousness: awake and alert and oriented Pain management: pain level controlled Vital Signs Assessment: post-procedure vital signs reviewed and stable Respiratory status: spontaneous breathing, nonlabored ventilation and respiratory function stable Cardiovascular status: blood pressure returned to baseline and stable Postop Assessment: no apparent nausea or vomiting Anesthetic complications: no  No notable events documented.   Last Vitals:  Vitals:   04/22/22 0843 04/22/22 1107  BP: 136/64 137/71  Pulse: 70 74  Resp: 19 16  Temp: 36.6 C 36.5 C  SpO2: 98% 100%    Last Pain:  Vitals:   04/22/22 1107  TempSrc: Oral  PainSc: 0-No pain                 Atiyana Welte C Arelia Volpe

## 2022-04-22 NOTE — H&P (Signed)
@LOGO @   Primary Care Physician:  Lindell Spar, MD Primary Gastroenterologist:  Dr. Gala Romney  Pre-Procedure History & Physical: HPI:  [name] is a 77 y.o. female here for  colonoscopy.  Positive Cologuard.   Denies any bowel symptoms at this time.  Did not echocardiogram day before yesterday or yesterday presented here for colonoscopy and atrial flutter with RVR.  Went to the ED was evaluated and treated.  Back in sinus rhythm.  Discussed with anesthesia yesterday and today.  She is remain on clear liquids so that her colonoscopy can be completed.  Past Medical History:  Diagnosis Date   Allergic rhinitis    Arthritis    B12 deficiency    Bipolar disorder ([place])    Coronary artery disease    Depression    Diabetes mellitus ([place])    Diabetes mellitus ([place]) 10/05/2021   Essential hypertension    GERD (gastroesophageal reflux disease)    Hashimoto's thyroiditis    History of transient ischemic attack (TIA)    Or possibly migraine as well as right amaurosis fugax and ataxia - Dr. Erling Cruz   Hyperlipidemia    Hypothyroidism    PSVT (paroxysmal supraventricular tachycardia)    Right bundle branch block     Past Surgical History:  Procedure Laterality Date   CATARACT EXTRACTION W/PHACO Left 05/23/2020   Procedure: CATARACT EXTRACTION PHACO AND INTRAOCULAR LENS PLACEMENT LEFT EYE;  Surgeon: Baruch Goldmann, MD;  Location: [facility];  Service: Ophthalmology;  Laterality: Left;  left CDE=15.69   CATARACT EXTRACTION W/PHACO Right 06/13/2020   Procedure: CATARACT EXTRACTION PHACO AND INTRAOCULAR LENS PLACEMENT RIGHT EYE;  Surgeon: Baruch Goldmann, MD;  Location: [facility];  Service: Ophthalmology;  Laterality: Right;  right CDE=11.33   LAPAROSCOPIC CHOLECYSTECTOMY  2010   THYROIDECTOMY  2009   TOTAL KNEE ARTHROPLASTY Left 02/16/2021   Procedure: TOTAL KNEE ARTHROPLASTY;  Surgeon: Gaynelle Arabian, MD;  Location: [facility];  Service: Orthopedics;  Laterality: Left;    Prior to Admission  medications   Medication Sig Start Date End Date Taking? Authorizing Provider  acetaminophen (TYLENOL) 500 MG tablet Take 1,000 mg by mouth every 6 (six) hours as needed for moderate pain or headache.   Yes [provider]  atorvastatin (LIPITOR) 80 MG tablet TAKE 1 TABLET BY MOUTH ONCE A DAY. 12/14/21  Yes Imogene Burn, PA-C  buPROPion (WELLBUTRIN XL) 300 MG 24 hr tablet Take 300 mg by mouth daily.  02/08/14  Yes [provider]  clonazePAM (KLONOPIN) 0.5 MG tablet Take 0.25-0.5 mg by mouth in the morning, at noon, and at bedtime.   Yes   DEXILANT 60 MG capsule Take 1 capsule (60 mg total) by mouth daily. 01/08/22  Yes Patel, Colin Broach, MD  diltiazem (CARDIZEM CD) 240 MG 24 hr capsule TAKE (1) CAPSULE BY MOUTH ONCE DAILY. 08/11/21  Yes Satira Sark, MD  DULoxetine (CYMBALTA) 60 MG capsule Take 60 mg by mouth 2 (two) times daily.  01/29/14  Yes [provider]  levothyroxine (SYNTHROID) 137 MCG tablet Take 1 tablet (137 mcg total) by mouth daily. Patient taking differently: Take 137 mcg by mouth daily. 02/03/22  Yes Lindell Spar, MD  losartan (COZAAR) 50 MG tablet Take 1 tablet (50 mg total) by mouth 2 (two) times daily. 03/30/22  Yes Patel, Colin Broach, MD  LYBALVI 5-10 MG TABS Take 1 tablet by mouth at bedtime. 03/10/20  Yes [provider]  metFORMIN (GLUCOPHAGE) 500 MG tablet Take 1 tablet (500 mg  total) by mouth 2 (two) times daily with a meal. 03/30/22  Yes Lindell Spar, MD  mirabegron ER (MYRBETRIQ) 25 MG TB24 tablet Take 1 tablet (25 mg total) by mouth daily. 02/01/22  Yes Lindell Spar, MD  Multiple Vitamins-Minerals (ICAPS AREDS 2 PO) Take 1 capsule by mouth 2 (two) times daily.   Yes [provider]  pantoprazole (PROTONIX) 40 MG tablet Take 40 mg by mouth daily.   Yes [provider]  albuterol (VENTOLIN HFA) 108 (90 Base) MCG/ACT inhaler Inhale 2 puffs into the lungs every 6 (six) hours as needed for wheezing or shortness of  breath. 09/02/20   Noreene Larsson, NP  alendronate (FOSAMAX) 70 MG tablet TAKE 1 TABLET EVERY WEEK IN THE MORNING 30 MIN BEFORE EATING WITH AN 8OZ GLASS OF WATER (SIT UP 30 MIN) 02/01/22   Lindell Spar, MD  blood glucose meter kit and supplies KIT Dispense based on patient and insurance preference. Use up to four times daily as directed. 02/01/22   Lindell Spar, MD  Lancets (ONETOUCH DELICA PLUS [id number]) MISC USE TO TEST BLOOD SUGAR TWICE DAILY. 07/31/21   Lindell Spar, MD  Na Sulfate-K Sulfate-Mg Sulf 17.5-3.13-1.6 GM/177ML SOLN As directed Patient not taking: Reported on 04/21/2022 03/17/22   Daneil Dolin, MD  ondansetron (ZOFRAN) 4 MG tablet Take 1 tablet (4 mg total) by mouth every 8 (eight) hours as needed for nausea or vomiting. Patient not taking: Reported on 04/14/2022 06/11/21   Lindell Spar, MD  [facility] ULTRA test strip USE TO TEST BLOOD SUGAR TWICE DAILY. 02/15/22   Lindell Spar, MD    Allergies as of 04/21/2022 - Review Complete 04/21/2022  Allergen Reaction Noted   Amoxicillin-pot clavulanate Rash 11/06/2009   Atenolol Rash 11/04/2011   Penicillins Rash 07/04/2014   Sulfonamide derivatives Rash 11/06/2009    Family History  Problem Relation Age of Onset   Stroke Mother    Thyroid disease Mother    Depression Father    Hypertension Sister    Aneurysm Sister    Diabetes Brother    Heart attack Brother    Hypertension Brother    Heart failure Brother    Thyroid disease Brother    Stroke Maternal Grandmother    Heart failure Maternal Grandmother    Heart failure Maternal Grandfather    Stroke Maternal Grandfather    Heart failure Paternal Grandmother    Cancer - Colon Neg Hx     Social History   Socioeconomic History   Marital status: Widowed    Spouse name: Not on file   Number of children: 2   Years of education: 35   Highest education level: Not on file  Occupational History   Occupation: homemaker    Employer: RETIRED  Tobacco Use   Smoking  status: Former    Packs/day: 0.50    Years: 10.00    Additional pack years: 0.00    Total pack years: 5.00    Types: Cigarettes    Quit date: 01/26/1983    Years since quitting: 39.2   Smokeless tobacco: Never   Tobacco comments:    quit 30 years ago  Vaping Use   Vaping Use: Never used  Substance and Sexual Activity   Alcohol use: No    Alcohol/week: 0.0 standard drinks of alcohol   Drug use: No   Sexual activity: Not Currently    Birth control/protection: Post-menopausal  Other Topics Concern   Not on file  Social History Narrative   Regular exercise: walk when able   Caffeine use: occasionally   Social Determinants of Health   Financial Resource Strain: Low Risk  (02/13/2021)   Overall Financial Resource Strain (CARDIA)    Difficulty of Paying Living Expenses: Not hard at all  Food Insecurity: No Food Insecurity (02/13/2021)   Hunger Vital Sign    Worried About Running Out of Food in the Last Year: Never true    Ran Out of Food in the Last Year: Never true  Transportation Needs: No Transportation Needs (02/13/2021)   PRAPARE - Hydrologist (Medical): No    Lack of Transportation (Non-Medical): No  Physical Activity: Inactive (02/13/2021)   Exercise Vital Sign    Days of Exercise per Week: 0 days    Minutes of Exercise per Session: 0 min  Stress: No Stress Concern Present (02/13/2021)   [place]    Feeling of Stress : Not at all  Social Connections: Moderately Isolated (02/13/2021)   Social Connection and Isolation Panel [NHANES]    Frequency of Communication with Friends and Family: More than three times a week    Frequency of Social Gatherings with Friends and Family: Never    Attends Religious Services: More than 4 times per year    Active Member of [employer] or Organizations: No    Attends Archivist Meetings: Never    Marital Status: Widowed  Intimate Partner  Violence: Not At Risk (02/13/2021)   Humiliation, Afraid, Rape, and Kick questionnaire    Fear of Current or Ex-Partner: No    Emotionally Abused: No    Physically Abused: No    Sexually Abused: No    Review of Systems: See HPI, otherwise negative ROS  Physical Exam: BP 136/64   Pulse 70   Temp 97.8 F (36.6 C) (Oral)   Resp 19   SpO2 98%  General:   Alert,  Well-developed, well-nourished, pleasant and cooperative in NAD Neck:  Supple; no masses or thyromegaly. No significant cervical adenopathy. Lungs:  Clear throughout to auscultation.   No wheezes, crackles, or rhonchi. No acute distress. Heart:  Regular rate and rhythm; no murmurs, clicks, rubs,  or gallops. Abdomen: Non-distended, normal bowel sounds.  Soft and nontender without appreciable mass or hepatosplenomegaly.  Pulses:  Normal pulses noted. Extremities:  Without clubbing or edema.  Impression/Plan:    77 year old lady with a positive Cologuard.  Recent atrial flutter treated now back in sinus rhythm.  Here for a diagnostic colonoscopy per plan. The risks, benefits, limitations, alternatives and imponderables have been reviewed with the patient. Questions have been answered. All parties are agreeable.       Notice: This dictation was prepared with Dragon dictation along with smaller phrase technology. Any transcriptional errors that result from this process are unintentional and may not be corrected upon review.

## 2022-04-22 NOTE — Op Note (Signed)
[facility] Patient Name: [name] Procedure Date: 04/22/2022 10:08 AM MRN: [id number] Date of Birth: 1945/06/08 Attending MD: Norvel Richards , MD, [id number] CSN: [id number] Age: 77 Admit Type: Outpatient Procedure:                Colonoscopy Indications:              Positive Cologuard test Providers:                Norvel Richards, MD, Crystal Page, Wynonia Musty Tech, Technician Referring MD:              Medicines:                Propofol per Anesthesia Complications:            No immediate complications. Estimated Blood Loss:     Estimated blood loss was minimal. Procedure:                Pre-Anesthesia Assessment:                           - Prior to the procedure, a History and Physical                            was performed, and patient medications and                            allergies were reviewed. The patient's tolerance of                            previous anesthesia was also reviewed. The risks                            and benefits of the procedure and the sedation                            options and risks were discussed with the patient.                            All questions were answered, and informed consent                            was obtained. Prior Anticoagulants: The patient has                            taken no anticoagulant or antiplatelet agents. ASA                            Grade Assessment: III - A patient with severe                            systemic disease. After reviewing the risks and  benefits, the patient was deemed in satisfactory                            condition to undergo the procedure.                           After obtaining informed consent, the colonoscope                            was passed under direct vision. Throughout the                            procedure, the patient's blood pressure, pulse, and                            oxygen  saturations were monitored continuously. The                            [id number]) scope was introduced through the                            anus and advanced to the the cecum, identified by                            appendiceal orifice and ileocecal valve. The entire                            colon was not well visualized. Scope In: 10:35:33 AM Scope Out: 11:01:25 AM Scope Withdrawal Time: 0 hours 10 minutes 53 seconds  Total Procedure Duration: 0 hours 25 minutes 52 seconds  Findings:      The perianal and digital rectal examinations were normal. Prep on the       right side of the colon was inadequate. All of the mucosal surfaces were       not seen. Markedly redundant colon requiring external abdominal pressure       and change in the patient's position.      Scattered medium-mouthed diverticula were found in the colon.      Three sessile polyps were found in the transverse colon and mid       transverse colon. The polyps were 4 to 5 mm in size. These polyps were       removed with a cold snare. Resection and retrieval were complete.       Estimated blood loss was minimal.      The exam was otherwise without abnormality on direct and retroflexion       views. Impression:               - Diverticulosis. Inadequate preparation. Redundant                            and elongated colon.                           - Three 4 to 5 mm polyps in the transverse colon  and in the mid transverse colon, removed with a                            cold snare. Resected and retrieved.                           - The examination was otherwise normal on direct                            and retroflexion views. Moderate Sedation:      Moderate (conscious) sedation was personally administered by an       anesthesia professional. The following parameters were monitored: oxygen       saturation, heart rate, blood pressure, respiratory rate, EKG, adequacy       of  pulmonary ventilation, and response to care. Recommendation:           - Patient has a contact number available for                            emergencies. The signs and symptoms of potential                            delayed complications were discussed with the                            patient. Return to normal activities tomorrow.                            Written discharge instructions were provided to the                            patient.                           - Advance diet as tolerated.                           - Continue present medications.                           - Repeat colonoscopy date to be determined after                            pending pathology results are reviewed for                            surveillance.                           - Return to GI office (date not yet determined). Procedure Code(s):        --- Professional ---                           [phone], Colonoscopy, flexible; with removal of  tumor(s), polyp(s), or other lesion(s) by snare                            technique Diagnosis Code(s):        --- Professional ---                           R19.5, Other fecal abnormalities                           K57.30, Diverticulosis of large intestine without                            perforation or abscess without bleeding CPT copyright 2022 American Medical Association. All rights reserved. The codes documented in this report are preliminary and upon coder review may  be revised to meet current compliance requirements. [name]. Carrie Klutz, MD Norvel Richards, MD 04/22/2022 11:11:30 AM This report has been signed electronically. Number of Addenda: 0

## 2022-04-22 NOTE — Transfer of Care (Signed)
Immediate Anesthesia Transfer of Care Note  Patient: [name]  Procedure(s) Performed: COLONOSCOPY WITH PROPOFOL POLYPECTOMY INTESTINAL  Patient Location: Endoscopy Unit  Anesthesia Type:General  Level of Consciousness: awake, alert , oriented, and patient cooperative  Airway & Oxygen Therapy: Patient Spontanous Breathing  Post-op Assessment: Report given to RN, Post -op Vital signs reviewed and stable, and Patient moving all extremities  Post vital signs: Reviewed and stable  Last Vitals:  Vitals Value Taken Time  BP 137/71 04/22/22 1107  Temp 36.5 C 04/22/22 1107  Pulse 74 04/22/22 1107  Resp 16 04/22/22 1107  SpO2 100 % 04/22/22 1107    Last Pain:  Vitals:   04/22/22 1107  TempSrc: Oral  PainSc: 0-No pain      Patients Stated Pain Goal: 5 ([id number])  Complications: No notable events documented.

## 2022-04-22 NOTE — Discharge Instructions (Addendum)
  Colonoscopy Discharge Instructions  Read the instructions outlined below and refer to this sheet in the next few weeks. These discharge instructions provide you with general information on caring for yourself after you leave the hospital. Your doctor may also give you specific instructions. While your treatment has been planned according to the most current medical practices available, unavoidable complications occasionally occur. If you have any problems or questions after discharge, call Dr. Gala Romney at [phone]. ACTIVITY You may resume your regular activity, but move at a slower pace for the next 24 hours.  Take frequent rest periods for the next 24 hours.  Walking will help get rid of the air and reduce the bloated feeling in your belly (abdomen).  No driving for 24 hours (because of the medicine (anesthesia) used during the test).   Do not sign any important legal documents or operate any machinery for 24 hours (because of the anesthesia used during the test).  NUTRITION Drink plenty of fluids.  You may resume your normal diet as instructed by your doctor.  Begin with a light meal and progress to your normal diet. Heavy or fried foods are harder to digest and may make you feel sick to your stomach (nauseated).  Avoid alcoholic beverages for 24 hours or as instructed.  MEDICATIONS You may resume your normal medications unless your doctor tells you otherwise.  WHAT YOU CAN EXPECT TODAY Some feelings of bloating in the abdomen.  Passage of more gas than usual.  Spotting of blood in your stool or on the toilet paper.  IF YOU HAD POLYPS REMOVED DURING THE COLONOSCOPY: No aspirin products for 7 days or as instructed.  No alcohol for 7 days or as instructed.  Eat a soft diet for the next 24 hours.  FINDING OUT THE RESULTS OF YOUR TEST Not all test results are available during your visit. If your test results are not back during the visit, make an appointment with your caregiver to find out the  results. Do not assume everything is normal if you have not heard from your caregiver or the medical facility. It is important for you to follow up on all of your test results.  SEEK IMMEDIATE MEDICAL ATTENTION IF: You have more than a spotting of blood in your stool.  Your belly is swollen (abdominal distention).  You are nauseated or vomiting.  You have a temperature over 101.  You have abdominal pain or discomfort that is severe or gets worse throughout the day.       The prep of the colon on the right side was not very good.  Somewhat of a incomplete examination.    3 small polyps found and removed.  At patient request, I called Verlin Fester at [phone] reviewed findings  Further recommendations to follow pending review of pathology report   reviewed findings and recommendations.

## 2022-04-22 NOTE — Anesthesia Preprocedure Evaluation (Addendum)
Anesthesia Evaluation  Patient identified by MRN, date of birth, ID band Patient awake    Reviewed: Allergy & Precautions, H&P , NPO status , Patient's Chart, lab work & pertinent test results  Airway Mallampati: II  TM Distance: >3 FB Neck ROM: Full    Dental  (+) Dental Advisory Given Crowns :   Pulmonary shortness of breath and with exertion, former smoker   Pulmonary exam normal breath sounds clear to auscultation       Cardiovascular METS: 3 - Mets hypertension, Pt. on medications + CAD  Normal cardiovascular exam+ dysrhythmias Atrial Fibrillation and Supra Ventricular Tachycardia  Rhythm:Regular Rate:Normal     Neuro/Psych  PSYCHIATRIC DISORDERS Anxiety Depression Bipolar Disorder   negative neurological ROS     GI/Hepatic Neg liver ROS,GERD  Medicated and Controlled,,  Endo/Other  diabetes, Well Controlled, Type 2, Oral Hypoglycemic AgentsHypothyroidism    Renal/GU negative Renal ROS  negative genitourinary   Musculoskeletal  (+) Arthritis , Osteoarthritis,    Abdominal   Peds negative pediatric ROS (+)  Hematology  (+) Blood dyscrasia, anemia   Anesthesia Other Findings   Reproductive/Obstetrics negative OB ROS                             Anesthesia Physical Anesthesia Plan  ASA: 3  Anesthesia Plan: General   Post-op Pain Management: Minimal or no pain anticipated   Induction: Intravenous  PONV Risk Score and Plan: 1 and Propofol infusion  Airway Management Planned: Nasal Cannula and Natural Airway  Additional Equipment:   Intra-op Plan:   Post-operative Plan:   Informed Consent: I have reviewed the patients History and Physical, chart, labs and discussed the procedure including the risks, benefits and alternatives for the proposed anesthesia with the patient or authorized representative who has indicated his/her understanding and acceptance.     Dental advisory  given  Plan Discussed with: CRNA and Surgeon  Anesthesia Plan Comments:        Anesthesia Quick Evaluation

## 2022-04-25 LAB — SURGICAL PATHOLOGY

## 2022-04-26 ENCOUNTER — Encounter: Payer: Self-pay | Admitting: Internal Medicine

## 2022-04-26 ENCOUNTER — Telehealth: Payer: Self-pay

## 2022-04-26 NOTE — Telephone Encounter (Signed)
     Patient  visit on 3/27  at [place] you been able to follow up with your primary care physician? Yes   The patient was or was not able to obtain any needed medicine or equipment. Na   Are there diet recommendations that you are having difficulty following? Na   Patient expresses understanding of discharge instructions and education provided has no other needs at this time.  Yes      [place] [phone] [address], [place], [place] [address] Phone: [phone] Email: [email or web address]

## 2022-04-27 ENCOUNTER — Other Ambulatory Visit: Payer: Self-pay | Admitting: Internal Medicine

## 2022-04-28 DIAGNOSIS — H43813 Vitreous degeneration, bilateral: Secondary | ICD-10-CM | POA: Diagnosis not present

## 2022-04-28 DIAGNOSIS — H353132 Nonexudative age-related macular degeneration, bilateral, intermediate dry stage: Secondary | ICD-10-CM | POA: Diagnosis not present

## 2022-05-03 ENCOUNTER — Encounter (HOSPITAL_COMMUNITY): Payer: Self-pay | Admitting: Internal Medicine

## 2022-05-04 ENCOUNTER — Telehealth: Payer: Self-pay

## 2022-05-04 NOTE — Telephone Encounter (Signed)
Patient called today states she had a tcs on 04/22/2022, and Dr. Jena Gauss spoke with her son afterwards and he tried to relay this information to his mother , but the patient says her son was really unable to tell her what was found. She says the son told her that there were several polyps that Dr. Jena Gauss could not remove. Patient concerned that she may have polyps that turn cancerous still inside of her, and wants to know what the plan for those are. Please advise.

## 2022-05-05 NOTE — Telephone Encounter (Signed)
[name], would you mind placing in recalls for March 2025 for repeat TCS. Thanks.  I spoke with the patient and made her aware that the colon prep was not as good as it could have been, which could prevent doctor from seeing everything that needed to be seen, and he recommended that we place in recalls for repeat in one year. Patient states understanding.

## 2022-05-05 NOTE — Telephone Encounter (Signed)
Thanks

## 2022-05-07 ENCOUNTER — Other Ambulatory Visit: Payer: Self-pay | Admitting: Internal Medicine

## 2022-05-08 ENCOUNTER — Other Ambulatory Visit: Payer: Self-pay | Admitting: Family Medicine

## 2022-05-08 DIAGNOSIS — E038 Other specified hypothyroidism: Secondary | ICD-10-CM

## 2022-05-10 NOTE — Telephone Encounter (Signed)
Patient left a message over the weekend that she understood the message that was left on 4/9 but wants to know if there is anything she can do to help where she doesn't have polyps.

## 2022-05-10 NOTE — Telephone Encounter (Signed)
General recommendations:  Follow a healthy diet high in fruits, vegetables, low in red and processed meats.  Goal of 30 minutes of exercise daily.  Weight loss to achieve a normal BMI Avoid tobacco use.  Limit alcohol to no more than 1 drink per day.   Even with following the above recommendations, she can still develop polyps as there are things she can't change such as genetics. However, this may decrease her risk somewhat.

## 2022-05-10 NOTE — Telephone Encounter (Signed)
LMOM for pt to call office  

## 2022-05-11 DIAGNOSIS — H04123 Dry eye syndrome of bilateral lacrimal glands: Secondary | ICD-10-CM | POA: Diagnosis not present

## 2022-05-11 DIAGNOSIS — H353132 Nonexudative age-related macular degeneration, bilateral, intermediate dry stage: Secondary | ICD-10-CM | POA: Diagnosis not present

## 2022-05-14 ENCOUNTER — Other Ambulatory Visit (HOSPITAL_COMMUNITY): Payer: Self-pay | Admitting: Internal Medicine

## 2022-05-14 DIAGNOSIS — Z1231 Encounter for screening mammogram for malignant neoplasm of breast: Secondary | ICD-10-CM

## 2022-05-17 ENCOUNTER — Encounter: Payer: Self-pay | Admitting: *Deleted

## 2022-05-17 NOTE — Telephone Encounter (Signed)
Tried to reach pt. Sent a [facility] geologist.

## 2022-05-24 ENCOUNTER — Other Ambulatory Visit: Payer: Self-pay | Admitting: Internal Medicine

## 2022-06-03 ENCOUNTER — Ambulatory Visit (INDEPENDENT_AMBULATORY_CARE_PROVIDER_SITE_OTHER): Payer: PPO | Admitting: Internal Medicine

## 2022-06-03 ENCOUNTER — Other Ambulatory Visit: Payer: Self-pay | Admitting: Physician Assistant

## 2022-06-03 ENCOUNTER — Other Ambulatory Visit: Payer: Self-pay | Admitting: Internal Medicine

## 2022-06-03 ENCOUNTER — Encounter: Payer: Self-pay | Admitting: Internal Medicine

## 2022-06-03 VITALS — BP 136/66 | HR 68 | Ht 62.0 in | Wt 195.0 lb

## 2022-06-03 DIAGNOSIS — E1169 Type 2 diabetes mellitus with other specified complication: Secondary | ICD-10-CM | POA: Diagnosis not present

## 2022-06-03 DIAGNOSIS — F02B18 Dementia in other diseases classified elsewhere, moderate, with other behavioral disturbance: Secondary | ICD-10-CM

## 2022-06-03 DIAGNOSIS — I483 Typical atrial flutter: Secondary | ICD-10-CM | POA: Insufficient documentation

## 2022-06-03 DIAGNOSIS — I471 Supraventricular tachycardia, unspecified: Secondary | ICD-10-CM | POA: Diagnosis not present

## 2022-06-03 DIAGNOSIS — Z7984 Long term (current) use of oral hypoglycemic drugs: Secondary | ICD-10-CM

## 2022-06-03 DIAGNOSIS — G300 Alzheimer's disease with early onset: Secondary | ICD-10-CM

## 2022-06-03 DIAGNOSIS — R61 Generalized hyperhidrosis: Secondary | ICD-10-CM

## 2022-06-03 DIAGNOSIS — E890 Postprocedural hypothyroidism: Secondary | ICD-10-CM | POA: Diagnosis not present

## 2022-06-03 DIAGNOSIS — F0280 Dementia in other diseases classified elsewhere without behavioral disturbance: Secondary | ICD-10-CM | POA: Insufficient documentation

## 2022-06-03 DIAGNOSIS — I10 Essential (primary) hypertension: Secondary | ICD-10-CM

## 2022-06-03 DIAGNOSIS — F3170 Bipolar disorder, currently in remission, most recent episode unspecified: Secondary | ICD-10-CM

## 2022-06-03 MED ORDER — DONEPEZIL HCL 5 MG PO TABS
5.0000 mg | ORAL_TABLET | Freq: Every day | ORAL | 0 refills | Status: DC
Start: 2022-06-03 — End: 2022-08-09

## 2022-06-03 NOTE — Assessment & Plan Note (Addendum)
BP Readings from Last 1 Encounters:  06/03/22 136/66   Well-controlled with Losartan 50 mg BID and Diltiazem 240 mg QD Counseled for compliance with the medications Advised DASH diet and moderate exercise/walking as tolerated

## 2022-06-03 NOTE — Assessment & Plan Note (Signed)
Had an episode of A flutter while she had prep for colonoscopy due to diarrhea and missing dose of Diltiazem, converted to sinus rhythm with additional dose of diltiazem in ER Since it was likely due to dehydration and an isolated episode, would avoid anticoagulation Has remained in sinus rhythm since that episode

## 2022-06-03 NOTE — Assessment & Plan Note (Signed)
Unclear etiology currently, check TSH and free T4 If thyroid function tests are WNL, may have to adjust psychiatric medications -currently on Wellbutrin 300 mg daily, Cymbalta 60 mg twice daily and Lybalvi Needs to check blood glucose when she has episode, could be hypoglycemia as well 

## 2022-06-03 NOTE — Assessment & Plan Note (Signed)
Followed by Dr. Betti Cruz On Claretha Cooper, Wellbutrin and Cymbalta On clonazepam as needed Needs to discuss about decreasing dose of Wellbutrin or Cymbalta as her sweating could be due to serotonin syndrome

## 2022-06-03 NOTE — Patient Instructions (Addendum)
Please take Donepezil as prescribed for memory.  Please continue to take medications as prescribed.  Please continue to follow low carb diet and ambulate as tolerated.  Please discuss with Dr Betti Cruz about decreasing dose of Cymbalta or Wellbutrin.

## 2022-06-03 NOTE — Assessment & Plan Note (Signed)
Well controlled with diltiazem Had an episode of A flutter while she had prep for colonoscopy due to diarrhea and missing dose of Diltiazem, converted to sinus rhythm with additional dose of diltiazem in ER Followed by Cardiology

## 2022-06-03 NOTE — Assessment & Plan Note (Addendum)
Lab Results  Component Value Date   TSH 8.640 (H) 03/30/2022   On Levothyroxine 137 mcg QD recently, was adjusted after last check of TSH Has sweating recently, check TSH and free T4 Referred to endocrinology

## 2022-06-03 NOTE — Assessment & Plan Note (Addendum)
MMSE: 24/30 Has had episodes of confusion Started donepezil 5 mg daily Referred to neurology

## 2022-06-03 NOTE — Assessment & Plan Note (Signed)
Lab Results  Component Value Date   HGBA1C 6.1 (H) 02/01/2022   Associated with HTN and HLD Well-controlled On Metformin Advised to check her blood glucose when she has episodes of sweating and facial flushing-could be hypoglycemia Advised to follow diabetic diet On ARB and statin F/u CMP and lipid panel Diabetic eye exam: Advised to follow up with Ophthalmology for diabetic eye exam 

## 2022-06-03 NOTE — Progress Notes (Signed)
Established Patient Office Visit  Subjective:  Patient ID: [name], female    DOB: 1945/03/09  Age: 77 y.o. MRN: [id number]  CC:  Chief Complaint  Patient presents with   Diabetes    Diabetes and hypertension follow up. Patient is asking to be referred to the thyroid specialist. Patient is having spells where she is getting confused on days of the week, time, lost when driving.     HPI [name] is a 77 y.o. female with past medical history of HTN, PSVT, GERD, hypothyroidism, HLD, mood disorder, OA and obesity who presents for f/u of her chronic medical conditions.  Hypothyroidism: She has been taking levothyroxine regularly.  Her dose of levothyroxine was adjusted to 137 mcg daily as her TSH was high about 2 months ago.  She has been having excessive sweating for the last few months.  She reports chronic fatigue as well, which could be related to uncontrolled hypothyroidism.  Denies any recent change in appetite or weight.  Denies any tremors or palpitations currently.  Denies any fever, chills or LAD.  HTN: BP is well-controlled. Takes medications regularly. Patient denies headache, dizziness, chest pain, dyspnea or palpitations.  Type II DM: Her last HbA1c was 6.1 in 01/24.  She is on metformin and has been tolerating it well.  Denies any polyuria or polyphagia currently.  Bipolar disorder: She is currently on Lybalvi, Cymbalta and Wellbutrin.  She also takes Klonopin for anxiety.  Followed by psychiatry.  She has chronic fatigue, but denies insomnia, SI or HI currently.  Denies any recent manic episodes.  She reports memory concerns.  Has had episodes of confusion while driving in her neighborhood, and has almost got lost in her neighborhood.  She has had 2 instances when she got ready for her doctors appointment on wrong days.  She is currently independent for ADLs.  She relies on her brother for transportation.   Past Medical History:  Diagnosis Date   Allergic  rhinitis    Arthritis    B12 deficiency    Bipolar disorder (HCC)    Coronary artery disease    Depression    Diabetes mellitus (HCC)    Diabetes mellitus (HCC) 10/05/2021   Essential hypertension    GERD (gastroesophageal reflux disease)    Hashimoto's thyroiditis    History of transient ischemic attack (TIA)    Or possibly migraine as well as right amaurosis fugax and ataxia - Dr. Sandria Manly   Hyperlipidemia    Hypothyroidism    PSVT (paroxysmal supraventricular tachycardia)    Right bundle branch block     Past Surgical History:  Procedure Laterality Date   CATARACT EXTRACTION W/PHACO Left 05/23/2020   Procedure: CATARACT EXTRACTION PHACO AND INTRAOCULAR LENS PLACEMENT LEFT EYE;  Surgeon: Fabio Pierce, MD;  Location: [facility];  Service: Ophthalmology;  Laterality: Left;  left CDE=15.69   CATARACT EXTRACTION W/PHACO Right 06/13/2020   Procedure: CATARACT EXTRACTION PHACO AND INTRAOCULAR LENS PLACEMENT RIGHT EYE;  Surgeon: Fabio Pierce, MD;  Location: [facility];  Service: Ophthalmology;  Laterality: Right;  right CDE=11.33   COLONOSCOPY WITH PROPOFOL N/A 04/22/2022   Procedure: COLONOSCOPY WITH PROPOFOL;  Surgeon: Corbin Ade, MD;  Location: [facility];  Service: Endoscopy;  Laterality: N/A;  8:30 am, asa 3   LAPAROSCOPIC CHOLECYSTECTOMY  2010   POLYPECTOMY  04/22/2022   Procedure: POLYPECTOMY INTESTINAL;  Surgeon: Corbin Ade, MD;  Location: [facility];  Service: Endoscopy;;   THYROIDECTOMY  2009  TOTAL KNEE ARTHROPLASTY Left 02/16/2021   Procedure: TOTAL KNEE ARTHROPLASTY;  Surgeon: Ollen Gross, MD;  Location: [facility];  Service: Orthopedics;  Laterality: Left;    Family History  Problem Relation Age of Onset   Stroke Mother    Thyroid disease Mother    Depression Father    Hypertension Sister    Aneurysm Sister    Diabetes Brother    Heart attack Brother    Hypertension Brother    Heart failure Brother    Thyroid disease Brother    Stroke Maternal Grandmother     Heart failure Maternal Grandmother    Heart failure Maternal Grandfather    Stroke Maternal Grandfather    Heart failure Paternal Grandmother    Cancer - Colon Neg Hx     Social History   Socioeconomic History   Marital status: Widowed    Spouse name: Not on file   Number of children: 2   Years of education: 3   Highest education level: Not on file  Occupational History   Occupation: homemaker    Employer: RETIRED  Tobacco Use   Smoking status: Former    Packs/day: 0.50    Years: 10.00    Additional pack years: 0.00    Total pack years: 5.00    Types: Cigarettes    Quit date: 01/26/1983    Years since quitting: 39.3   Smokeless tobacco: Never   Tobacco comments:    quit 30 years ago  Vaping Use   Vaping Use: Never used  Substance and Sexual Activity   Alcohol use: No    Alcohol/week: 0.0 standard drinks of alcohol   Drug use: No   Sexual activity: Not Currently    Birth control/protection: Post-menopausal  Other Topics Concern   Not on file  Social History Narrative   Regular exercise: walk when able   Caffeine use: occasionally   Social Determinants of Health   Financial Resource Strain: Low Risk  (02/13/2021)   Overall Financial Resource Strain (CARDIA)    Difficulty of Paying Living Expenses: Not hard at all  Food Insecurity: No Food Insecurity (02/13/2021)   Hunger Vital Sign    Worried About Running Out of Food in the Last Year: Never true    Ran Out of Food in the Last Year: Never true  Transportation Needs: No Transportation Needs (02/13/2021)   PRAPARE - Administrator, Civil Service (Medical): No    Lack of Transportation (Non-Medical): No  Physical Activity: Inactive (02/13/2021)   Exercise Vital Sign    Days of Exercise per Week: 0 days    Minutes of Exercise per Session: 0 min  Stress: No Stress Concern Present (02/13/2021)   [employer] of Occupational Health - Occupational Stress Questionnaire    Feeling of Stress : Not at all   Social Connections: Moderately Isolated (02/13/2021)   Social Connection and Isolation Panel [NHANES]    Frequency of Communication with Friends and Family: More than three times a week    Frequency of Social Gatherings with Friends and Family: Never    Attends Religious Services: More than 4 times per year    Active Member of [employer] or Organizations: No    Attends Banker Meetings: Never    Marital Status: Widowed  Intimate Partner Violence: Not At Risk (02/13/2021)   Humiliation, Afraid, Rape, and Kick questionnaire    Fear of Current or Ex-Partner: No    Emotionally Abused: No    Physically Abused: No  Sexually Abused: No    Outpatient Medications Prior to Visit  Medication Sig Dispense Refill   acetaminophen (TYLENOL) 500 MG tablet Take 1,000 mg by mouth every 6 (six) hours as needed for moderate pain or headache.     albuterol (VENTOLIN HFA) 108 (90 Base) MCG/ACT inhaler Inhale 2 puffs into the lungs every 6 (six) hours as needed for wheezing or shortness of breath. 18 g 0   alendronate (FOSAMAX) 70 MG tablet TAKE 1 TABLET EVERY WEEK IN THE MORNING 30 MIN BEFORE EATING WITH AN 8OZ GLASS OF WATER (SIT UP 30 MIN) 4 tablet 11   atorvastatin (LIPITOR) 80 MG tablet TAKE 1 TABLET BY MOUTH ONCE A DAY. 90 tablet 3   blood glucose meter kit and supplies KIT Dispense based on patient and insurance preference. Use up to four times daily as directed. 1 each 0   buPROPion (WELLBUTRIN XL) 300 MG 24 hr tablet Take 300 mg by mouth daily.      clonazePAM (KLONOPIN) 0.5 MG tablet Take 0.25-0.5 mg by mouth in the morning, at noon, and at bedtime.     DEXILANT 60 MG capsule Take 1 capsule (60 mg total) by mouth daily. 30 capsule 0   diltiazem (CARDIZEM CD) 240 MG 24 hr capsule TAKE (1) CAPSULE BY MOUTH ONCE DAILY. 90 capsule 3   DULoxetine (CYMBALTA) 60 MG capsule Take 60 mg by mouth 2 (two) times daily.      Lancets (ONETOUCH DELICA PLUS LANCET33G) MISC USE TO TEST BLOOD SUGAR TWICE  DAILY. 100 each 0   levothyroxine (SYNTHROID) 137 MCG tablet Take 1 tablet (137 mcg total) by mouth daily. (Patient taking differently: Take 137 mcg by mouth daily.) 90 tablet 1   losartan (COZAAR) 50 MG tablet Take 1 tablet (50 mg total) by mouth 2 (two) times daily. 60 tablet 5   LYBALVI 5-10 MG TABS Take 1 tablet by mouth at bedtime.     metFORMIN (GLUCOPHAGE) 500 MG tablet Take 1 tablet (500 mg total) by mouth 2 (two) times daily with a meal. 60 tablet 5   mirabegron ER (MYRBETRIQ) 25 MG TB24 tablet Take 1 tablet (25 mg total) by mouth daily. 30 tablet 5   Multiple Vitamins-Minerals (ICAPS AREDS 2 PO) Take 1 capsule by mouth 2 (two) times daily.     Na Sulfate-K Sulfate-Mg Sulf 17.5-3.13-1.6 GM/177ML SOLN As directed (Patient not taking: Reported on 04/21/2022) 354 mL 0   ondansetron (ZOFRAN) 4 MG tablet Take 1 tablet (4 mg total) by mouth every 8 (eight) hours as needed for nausea or vomiting. (Patient not taking: Reported on 04/14/2022) 20 tablet 0   ONETOUCH ULTRA TEST test strip USE TO TEST BLOOD SUGAR TWICE DAILY. 50 strip 0   levothyroxine (SYNTHROID) 125 MCG tablet Take 1 tablet (125 mcg total) by mouth daily. 90 tablet 0   pantoprazole (PROTONIX) 40 MG tablet Take 40 mg by mouth daily.     No facility-administered medications prior to visit.    Allergies  Allergen Reactions   Amoxicillin-Pot Clavulanate Rash   Atenolol Rash   Penicillins Rash    Tolerated Cephalosporin Date: 02/17/21.     Sulfonamide Derivatives Rash    ROS Review of Systems  Constitutional:  Positive for fatigue. Negative for chills and fever.  HENT:  Negative for congestion, sinus pressure and sinus pain.   Eyes:  Negative for pain and discharge.  Respiratory:  Negative for cough and shortness of breath.   Cardiovascular:  Negative for chest pain and  palpitations.  Gastrointestinal:  Negative for diarrhea, nausea and vomiting.  Genitourinary:  Negative for dysuria and hematuria.  Musculoskeletal:   Negative for neck pain and neck stiffness.  Skin:  Negative for rash.  Neurological:  Negative for dizziness and syncope.  Psychiatric/Behavioral:  Positive for confusion. Negative for agitation and behavioral problems. The patient is nervous/anxious.       Objective:    Physical Exam Vitals reviewed.  Constitutional:      General: She is not in acute distress.    Appearance: She is obese. She is not diaphoretic.  HENT:     Head: Normocephalic and atraumatic.     Right Ear: There is no impacted cerumen.     Left Ear: There is no impacted cerumen.     Nose: No congestion.     Mouth/Throat:     Mouth: Mucous membranes are moist.     Pharynx: No posterior oropharyngeal erythema.  Eyes:     General: No scleral icterus.    Extraocular Movements: Extraocular movements intact.  Cardiovascular:     Rate and Rhythm: Normal rate and regular rhythm.     Pulses: Normal pulses.     Heart sounds: Normal heart sounds. No murmur heard. Pulmonary:     Breath sounds: Normal breath sounds. No wheezing or rales.  Musculoskeletal:     Cervical back: Neck supple. No tenderness.     Right lower leg: No edema.     Left lower leg: No edema.  Skin:    General: Skin is warm.     Findings: No rash.  Neurological:     General: No focal deficit present.     Mental Status: She is alert and oriented to person, place, and time.  Psychiatric:        Mood and Affect: Mood normal.        Behavior: Behavior normal.     BP 136/66 (BP Location: Right Arm)   Pulse 68   Ht 5\' 2"  (1.575 m)   Wt 195 lb (88.5 kg)   SpO2 93%   BMI 35.67 kg/m  Wt Readings from Last 3 Encounters:  06/03/22 195 lb (88.5 kg)  04/21/22 200 lb (90.7 kg)  04/19/22 189 lb 6.4 oz (85.9 kg)    Lab Results  Component Value Date   TSH 8.640 (H) 03/30/2022   Lab Results  Component Value Date   WBC 10.5 04/21/2022   HGB 14.5 04/21/2022   HCT 41.2 04/21/2022   MCV 94.7 04/21/2022   PLT 376 04/21/2022   Lab Results   Component Value Date   NA 134 (L) 04/21/2022   K 3.3 (L) 04/21/2022   CO2 26 04/21/2022   GLUCOSE 113 (H) 04/21/2022   BUN 6 (L) 04/21/2022   CREATININE 0.59 04/21/2022   BILITOT 0.8 04/21/2022   ALKPHOS 79 04/21/2022   AST 33 04/21/2022   ALT 41 04/21/2022   PROT 7.7 04/21/2022   ALBUMIN 4.5 04/21/2022   CALCIUM 8.7 (L) 04/21/2022   ANIONGAP 11 04/21/2022   EGFR 87 03/30/2022   Lab Results  Component Value Date   CHOL 149 05/26/2021   Lab Results  Component Value Date   HDL 59 05/26/2021   Lab Results  Component Value Date   LDLCALC 73 05/26/2021   Lab Results  Component Value Date   TRIG 94 05/26/2021   Lab Results  Component Value Date   CHOLHDL 2.5 05/26/2021   Lab Results  Component Value Date   HGBA1C 6.1 (  H) 02/01/2022      Assessment & Plan:   Problem List Items Addressed This Visit       Cardiovascular and Mediastinum   Essential hypertension - Primary    BP Readings from Last 1 Encounters:  06/03/22 136/66  Well-controlled with Losartan 50 mg BID and Diltiazem 240 mg QD Counseled for compliance with the medications Advised DASH diet and moderate exercise/walking as tolerated      PSVT (paroxysmal supraventricular tachycardia)    Well controlled with diltiazem Had an episode of A flutter while she had prep for colonoscopy due to diarrhea and missing dose of Diltiazem, converted to sinus rhythm with additional dose of diltiazem in ER Followed by Cardiology      Typical atrial flutter (HCC)    Had an episode of A flutter while she had prep for colonoscopy due to diarrhea and missing dose of Diltiazem, converted to sinus rhythm with additional dose of diltiazem in ER Since it was likely due to dehydration and an isolated episode, would avoid anticoagulation Has remained in sinus rhythm since that episode        Endocrine   Postoperative hypothyroidism    Lab Results  Component Value Date   TSH 8.640 (H) 03/30/2022  On Levothyroxine  137 mcg QD recently, was adjusted after last check of TSH Has sweating recently, check TSH and free T4 Referred to endocrinology      Relevant Orders   Basic Metabolic Panel (BMET)   Ambulatory referral to Endocrinology   TSH+T4F+T3Free   Diabetes mellitus (HCC)    Lab Results  Component Value Date   HGBA1C 6.1 (H) 02/01/2022  Associated with HTN and HLD Well-controlled On Metformin Advised to check her blood glucose when she has episodes of sweating and facial flushing-could be hypoglycemia Advised to follow diabetic diet On ARB and statin F/u CMP and lipid panel Diabetic eye exam: Advised to follow up with Ophthalmology for diabetic eye exam      Relevant Orders   Basic Metabolic Panel (BMET)   Hemoglobin A1c     Nervous and Auditory   Dementia    MMSE: 24/30 Has had episodes of confusion Started donepezil 5 mg daily Referred to neurology      Relevant Medications   donepezil (ARICEPT) 5 MG tablet   Other Relevant Orders   Ambulatory referral to Neurology     Other   Bipolar disorder (HCC) (Chronic)    Followed by Dr. Betti Cruz On Claretha Cooper, Wellbutrin and Cymbalta On clonazepam as needed Needs to discuss about decreasing dose of Wellbutrin or Cymbalta as her sweating could be due to serotonin syndrome      Excessive sweating (Chronic)    Unclear etiology currently, check TSH and free T4 If thyroid function tests are WNL, may have to adjust psychiatric medications -currently on Wellbutrin 300 mg daily, Cymbalta 60 mg twice daily and Lybalvi Needs to check blood glucose when she has episode, could be hypoglycemia as well       Meds ordered this encounter  Medications   donepezil (ARICEPT) 5 MG tablet    Sig: Take 1 tablet (5 mg total) by mouth at bedtime.    Dispense:  90 tablet    Refill:  0    Follow-up: Return in about 4 months (around 10/04/2022) for Annual physical (09/11).    Anabel Halon, MD

## 2022-06-04 ENCOUNTER — Encounter: Payer: Self-pay | Admitting: Physician Assistant

## 2022-06-04 ENCOUNTER — Encounter: Payer: Self-pay | Admitting: Internal Medicine

## 2022-06-04 LAB — BASIC METABOLIC PANEL
BUN/Creatinine Ratio: 15 (ref 12–28)
BUN: 10 mg/dL (ref 8–27)
CO2: 26 mmol/L (ref 20–29)
Calcium: 9.8 mg/dL (ref 8.7–10.3)
Chloride: 97 mmol/L (ref 96–106)
Creatinine, Ser: 0.66 mg/dL (ref 0.57–1.00)
Glucose: 91 mg/dL (ref 70–99)
Potassium: 4.8 mmol/L (ref 3.5–5.2)
Sodium: 138 mmol/L (ref 134–144)
eGFR: 91 mL/min/{1.73_m2} (ref >59)

## 2022-06-04 LAB — TSH+T4F+T3FREE
Free T4: 1.1 ng/dL (ref 0.82–1.77)
T3, Free: 2.1 pg/mL (ref 2.0–4.4)
TSH: 10.3 u[IU]/mL — ABNORMAL HIGH (ref 0.450–4.500)

## 2022-06-04 LAB — HEMOGLOBIN A1C
Est. average glucose Bld gHb Est-mCnc: 128 mg/dL
Hgb A1c MFr Bld: 6.1 % — ABNORMAL HIGH (ref 4.8–5.6)

## 2022-06-08 ENCOUNTER — Telehealth: Payer: Self-pay | Admitting: Internal Medicine

## 2022-06-08 NOTE — Telephone Encounter (Signed)
Pt  wants back with lab results

## 2022-06-08 NOTE — Telephone Encounter (Signed)
Spoke to patient about labs.

## 2022-06-10 ENCOUNTER — Telehealth: Payer: Self-pay | Admitting: Internal Medicine

## 2022-06-10 NOTE — Telephone Encounter (Signed)
CMA stated that pt stated that her med's were being decreased. CMA needs confirmation.     DULoxetine (CYMBALTA) 60 MG capsule [[id number]]  buPROPion (WELLBUTRIN XL) 300 MG 24 hr tablet [[id number]]

## 2022-06-10 NOTE — Telephone Encounter (Signed)
Calling back to inform that pt's Cymbalta was decreased to 30mg  qd

## 2022-06-10 NOTE — Telephone Encounter (Signed)
Dr patel spoke to cma -ash

## 2022-06-14 ENCOUNTER — Ambulatory Visit (HOSPITAL_COMMUNITY): Payer: PPO

## 2022-06-15 ENCOUNTER — Encounter: Payer: Self-pay | Admitting: Nurse Practitioner

## 2022-06-15 DIAGNOSIS — M1711 Unilateral primary osteoarthritis, right knee: Secondary | ICD-10-CM | POA: Diagnosis not present

## 2022-06-22 ENCOUNTER — Other Ambulatory Visit: Payer: Self-pay | Admitting: Internal Medicine

## 2022-06-22 DIAGNOSIS — N3941 Urge incontinence: Secondary | ICD-10-CM

## 2022-06-24 ENCOUNTER — Telehealth: Payer: Self-pay | Admitting: Internal Medicine

## 2022-06-24 NOTE — Telephone Encounter (Signed)
Patient called back feels like she needs an increase for this medicine.

## 2022-06-24 NOTE — Telephone Encounter (Signed)
Prescription Request  06/24/2022  LOV: 06/03/2022  What is the name of the medication or equipment? MYRBETRIQ 25 MG TB24 tablet   Have you contacted your pharmacy to request a refill? Yes   Which pharmacy would you like this sent to?  [facility] - [place], [place] - [address] [place] [address] Phone: [phone] Fax: [phone]    Patient notified that their request is being sent to the clinical staff for review and that they should receive a response within 2 business days.   Please advise at [facility] [phone]

## 2022-06-28 ENCOUNTER — Ambulatory Visit (HOSPITAL_COMMUNITY)
Admission: RE | Admit: 2022-06-28 | Discharge: 2022-06-28 | Disposition: A | Discharge status: Home / Self Care | Payer: PPO | Source: Ambulatory Visit | Attending: Internal Medicine | Admitting: Internal Medicine

## 2022-06-28 DIAGNOSIS — Z1231 Encounter for screening mammogram for malignant neoplasm of breast: Secondary | ICD-10-CM | POA: Insufficient documentation

## 2022-06-28 NOTE — Telephone Encounter (Signed)
Patient aware.

## 2022-06-29 ENCOUNTER — Other Ambulatory Visit: Payer: Self-pay

## 2022-06-30 ENCOUNTER — Ambulatory Visit: Payer: PPO | Admitting: Nutrition

## 2022-07-01 ENCOUNTER — Other Ambulatory Visit (INDEPENDENT_AMBULATORY_CARE_PROVIDER_SITE_OTHER): Payer: PPO

## 2022-07-01 ENCOUNTER — Ambulatory Visit: Payer: PPO | Admitting: Physician Assistant

## 2022-07-01 ENCOUNTER — Ambulatory Visit: Payer: PPO

## 2022-07-01 ENCOUNTER — Ambulatory Visit (INDEPENDENT_AMBULATORY_CARE_PROVIDER_SITE_OTHER): Payer: PPO | Admitting: Physician Assistant

## 2022-07-01 ENCOUNTER — Encounter: Payer: Self-pay | Admitting: Physician Assistant

## 2022-07-01 VITALS — BP 139/70 | HR 96 | Resp 18 | Ht 62.0 in | Wt 190.0 lb

## 2022-07-01 DIAGNOSIS — R413 Other amnesia: Secondary | ICD-10-CM

## 2022-07-01 NOTE — Patient Instructions (Addendum)
It was a pleasure to see you today at our office.   Recommendations:  Neurocognitive evaluation at our office   Increase donepezil to 10 mg daily.  MRI of the brain, the radiology office will call you to arrange you appointment   Check labs today B12  Monitor driving  Follow up in 3 months     For assessment of decision of mental capacity and competency:  Call Dr. Erick Blinks, geriatric psychiatrist at [phone] Counseling regarding caregiver distress, including caregiver depression, anxiety and issues regarding community resources, adult day care programs, adult living facilities, or memory care questions:  please contact your  Primary Doctor's Social Worker  Whom to call: Memory  decline, memory medications: Call our office [phone]  For psychiatric meds, mood meds: Please have your primary care physician manage these medications.  If you have any severe symptoms of a stroke, or other severe issues such as confusion,severe chills or fever, etc call 911 or go to the ER as you may need to be evaluated further    RECOMMENDATIONS FOR ALL PATIENTS WITH MEMORY PROBLEMS: 1. Continue to exercise (Recommend 30 minutes of walking everyday, or 3 hours every week) 2. Increase social interactions - continue going to [place] and enjoy social gatherings with friends and family 3. Eat healthy, avoid fried foods and eat more fruits and vegetables 4. Maintain adequate blood pressure, blood sugar, and blood cholesterol level. Reducing the risk of stroke and cardiovascular disease also helps promoting better memory. 5. Avoid stressful situations. Live a simple life and avoid aggravations. Organize your time and prepare for the next day in anticipation. 6. Sleep well, avoid any interruptions of sleep and avoid any distractions in the bedroom that may interfere with adequate sleep quality 7. Avoid sugar, avoid sweets as there is a strong link between excessive sugar intake, diabetes, and cognitive  impairment We discussed the Mediterranean diet, which has been shown to help patients reduce the risk of progressive memory disorders and reduces cardiovascular risk. This includes eating fish, eat fruits and green leafy vegetables, nuts like almonds and hazelnuts, walnuts, and also use olive oil. Avoid fast foods and fried foods as much as possible. Avoid sweets and sugar as sugar use has been linked to worsening of memory function.  There is always a concern of gradual progression of memory problems. If this is the case, then we may need to adjust level of care according to patient needs. Support, both to the patient and caregiver, should then be put into place.      You have been referred for a neuropsychological evaluation (i.e., evaluation of memory and thinking abilities). Please bring someone with you to this appointment if possible, as it is helpful for the doctor to hear from both you and another adult who knows you well. Please bring eyeglasses and hearing aids if you wear them.    The evaluation will take approximately 3 hours and has two parts:   The first part is a clinical interview with the neuropsychologist (Dr. Milbert Coulter or Dr. Roseanne Reno). During the interview, the neuropsychologist will speak with you and the individual you brought to the appointment.    The second part of the evaluation is testing with the doctor's technician Annabelle Harman or Selena Batten). During the testing, the technician will ask you to remember different types of material, solve problems, and answer some questionnaires. Your family member will not be present for this portion of the evaluation.   Please note: We must reserve several hours of  the neuropsychologist's time and the psychometrician's time for your evaluation appointment. As such, there is a No-Show fee of $100. If you are unable to attend any of your appointments, please contact our office as soon as possible to reschedule.    FALL PRECAUTIONS: Be cautious when walking.  Scan the area for obstacles that may increase the risk of trips and falls. When getting up in the mornings, sit up at the edge of the bed for a few minutes before getting out of bed. Consider elevating the bed at the head end to avoid drop of blood pressure when getting up. Walk always in a well-lit room (use night lights in the walls). Avoid area rugs or power cords from appliances in the middle of the walkways. Use a walker or a cane if necessary and consider physical therapy for balance exercise. Get your eyesight checked regularly.  FINANCIAL OVERSIGHT: Supervision, especially oversight when making financial decisions or transactions is also recommended.  HOME SAFETY: Consider the safety of the kitchen when operating appliances like stoves, microwave oven, and blender. Consider having supervision and share cooking responsibilities until no longer able to participate in those. Accidents with firearms and other hazards in the house should be identified and addressed as well.   ABILITY TO BE LEFT ALONE: If patient is unable to contact 911 operator, consider using LifeLine, or when the need is there, arrange for someone to stay with patients. Smoking is a fire hazard, consider supervision or cessation. Risk of wandering should be assessed by caregiver and if detected at any point, supervision and safe proof recommendations should be instituted.  MEDICATION SUPERVISION: Inability to self-administer medication needs to be constantly addressed. Implement a mechanism to ensure safe administration of the medications.   DRIVING: Regarding driving, in patients with progressive memory problems, driving will be impaired. We advise to have someone else do the driving if trouble finding directions or if minor accidents are reported. Independent driving assessment is available to determine safety of driving.   If you are interested in the driving assessment, you can contact the following:  [facility] in [place] [phone]  [facility] [phone]  [facility] [phone] or [phone]    Mediterranean Diet A Mediterranean diet refers to food and lifestyle choices that are based on the traditions of countries located on the [employer] [place]. This way of eating has been shown to help prevent certain conditions and improve outcomes for people who have chronic diseases, like kidney disease and heart disease. What are tips for following this plan? Lifestyle  Cook and eat meals together with your family, when possible. Drink enough fluid to keep your urine clear or pale yellow. Be physically active every day. This includes: Aerobic exercise like running or swimming. Leisure activities like gardening, walking, or housework. Get 7-8 hours of sleep each night. If recommended by your health care provider, drink red wine in moderation. This means 1 glass a day for nonpregnant women and 2 glasses a day for men. A glass of wine equals 5 oz (150 mL). Reading food labels  Check the serving size of packaged foods. For foods such as rice and pasta, the serving size refers to the amount of cooked product, not dry. Check the total fat in packaged foods. Avoid foods that have saturated fat or trans fats. Check the ingredients list for added sugars, such as corn syrup. Shopping  At the grocery store, buy most of your food from the  areas near the walls of the store. This includes: Fresh fruits and vegetables (produce). Grains, beans, nuts, and seeds. Some of these may be available in unpackaged forms or large amounts (in bulk). Fresh seafood. Poultry and eggs. Low-fat dairy products. Buy whole ingredients instead of prepackaged foods. Buy fresh fruits and vegetables in-season from local farmers markets. Buy frozen fruits and vegetables in resealable bags. If you do not have access to quality fresh seafood, buy precooked frozen  shrimp or canned fish, such as tuna, salmon, or sardines. Buy small amounts of raw or cooked vegetables, salads, or olives from the deli or salad bar at your store. Stock your pantry so you always have certain foods on hand, such as olive oil, canned tuna, canned tomatoes, rice, pasta, and beans. Cooking  Cook foods with extra-virgin olive oil instead of using butter or other vegetable oils. Have meat as a side dish, and have vegetables or grains as your main dish. This means having meat in small portions or adding small amounts of meat to foods like pasta or stew. Use beans or vegetables instead of meat in common dishes like chili or lasagna. Experiment with different cooking methods. Try roasting or broiling vegetables instead of steaming or sauteing them. Add frozen vegetables to soups, stews, pasta, or rice. Add nuts or seeds for added healthy fat at each meal. You can add these to yogurt, salads, or vegetable dishes. Marinate fish or vegetables using olive oil, lemon juice, garlic, and fresh herbs. Meal planning  Plan to eat 1 vegetarian meal one day each week. Try to work up to 2 vegetarian meals, if possible. Eat seafood 2 or more times a week. Have healthy snacks readily available, such as: Vegetable sticks with hummus. Greek yogurt. Fruit and nut trail mix. Eat balanced meals throughout the week. This includes: Fruit: 2-3 servings a day Vegetables: 4-5 servings a day Low-fat dairy: 2 servings a day Fish, poultry, or lean meat: 1 serving a day Beans and legumes: 2 or more servings a week Nuts and seeds: 1-2 servings a day Whole grains: 6-8 servings a day Extra-virgin olive oil: 3-4 servings a day Limit red meat and sweets to only a few servings a month What are my food choices? Mediterranean diet Recommended Grains: Whole-grain pasta. Brown rice. Bulgar wheat. Polenta. Couscous. Whole-wheat bread. Orpah Cobb. Vegetables: Artichokes. Beets. Broccoli. Cabbage. Carrots.  Eggplant. Green beans. Chard. Kale. Spinach. Onions. Leeks. Peas. Squash. Tomatoes. Peppers. Radishes. Fruits: Apples. Apricots. Avocado. Berries. Bananas. Cherries. Dates. Figs. Grapes. Lemons. Melon. Oranges. Peaches. Plums. Pomegranate. Meats and other protein foods: Beans. Almonds. Sunflower seeds. Pine nuts. Peanuts. Cod. Salmon. Scallops. Shrimp. Tuna. Tilapia. Clams. Oysters. Eggs. Dairy: Low-fat milk. Cheese. Greek yogurt. Beverages: Water. Red wine. Herbal tea. Fats and oils: Extra virgin olive oil. Avocado oil. Grape seed oil. Sweets and desserts: [place] yogurt with honey. Baked apples. Poached pears. Trail mix. Seasoning and other foods: Basil. Cilantro. Coriander. Cumin. Mint. Parsley. Sage. Rosemary. Tarragon. Garlic. Oregano. Thyme. Pepper. Balsalmic vinegar. Tahini. Hummus. Tomato sauce. Olives. Mushrooms. Limit these Grains: Prepackaged pasta or rice dishes. Prepackaged cereal with added sugar. Vegetables: Deep fried potatoes (french fries). Fruits: Fruit canned in syrup. Meats and other protein foods: Beef. Pork. Lamb. Poultry with skin. Hot dogs. Tomasa Blase. Dairy: Ice cream. Sour cream. Whole milk. Beverages: Juice. Sugar-sweetened soft drinks. Beer. Liquor and spirits. Fats and oils: Butter. Canola oil. Vegetable oil. Beef fat (tallow). Lard. Sweets and desserts: Cookies. Cakes. Pies. Candy. Seasoning and other foods: Mayonnaise. Premade sauces and  marinades. The items listed may not be a complete list. Talk with your dietitian about what dietary choices are right for you. Summary The Mediterranean diet includes both food and lifestyle choices. Eat a variety of fresh fruits and vegetables, beans, nuts, seeds, and whole grains. Limit the amount of red meat and sweets that you eat. Talk with your health care provider about whether it is safe for you to drink red wine in moderation. This means 1 glass a day for nonpregnant women and 2 glasses a day for men. A glass of wine equals 5  oz (150 mL). This information is not intended to replace advice given to you by your health care provider. Make sure you discuss any questions you have with your health care provider. Document Released: 09/04/2015 Document Revised: 10/07/2015 Document Reviewed: 09/04/2015 Elsevier Interactive Patient Education  2017 [employer].   We have sent a referral to [facility] for your MRI and they will call you directly to schedule your appointment. They are located at [address] [facility]. If you need to contact them directly please call [phone]    Labs today [address]

## 2022-07-01 NOTE — Progress Notes (Addendum)
Assessment/Plan:    The patient is seen in neurologic consultation at the request of Anabel Halon, MD for the evaluation of memory.  [name] is a very pleasant 77 y.o. year old RH female with a history of hypertension, hyperlipidemia, bipolar disorder, hypothyroidism, B12 deficiency, anxiety, GERD, iron deficiency anemia, history of decreased hearing, IBS, osteoarthritis, vitamin D deficiency, DM 2, macular degeneration, seen today for evaluation of memory loss. MoCA today is 20/30.  Patient has been placed by her PCP on donepezil 5 mg daily.  Patient has a strong family history of Alzheimer's disease with multiple cousins and other family members.  Findings are concerning for mild cognitive impairment suspicious for Alzheimer's disease, although etiology is unclear and requires further investigation.  She is able to participate in her independent ADLs and continues to drive short distances.       Memory Impairment  MRI brain without contrast to assess for underlying structural abnormality and assess vascular load  Neurocognitive testing to further evaluate cognitive concerns and determine other underlying cause of memory changes, including potential contribution from sleep, anxiety, or depression Recommend increasing donepezil to 10 mg daily for better coverage.,  Side effects discussed Check B12, TSH Monitor driving Recommend good control of cardiovascular risk factors.   Continue to control mood as per PCP and Psychiatry.  Folllow up in 3 months  Subjective:    The patient is accompanied by her brother who supplements the history.    How long did patient have memory difficulties? "Always been like that, so I can have good and bad days". Patient has some difficulty remembering recent conversations and people names.  And then she reports being confused about dates and time and mixes up the dates. repeats oneself?  Endorsed by her brother.  Disoriented when walking into a  room?  Patient denies  Leaving objects in unusual places?   denies   Wandering behavior? denies   Any personality changes ? denies   Any history of depression?: denies.  She has a history of anxiety. Hallucinations or paranoia? She denies but her brother says that she does.  1 time she was sitting at the porch and so "something rolling and I thought it was my neighbor".  It then disappeared.  Also, she thought that she should reach in the room on the kitchen window (over a year ago, unknown recently).   Seizures? denies    Any sleep changes?  "Recently I had trouble going to sleep " and  Denies  vivid dreams, REM behavior or sleepwalking.  However, a couple of times she got up at the wrong time, started to dress up to go out, and twice she even put make-up and jewelry. Sleep apnea? denies   Any hygiene concerns?  denies   Independent of bathing and dressing?  Endorsed Does the patient need help with medications? Pharmacist and her is in charge Who is in charge of the finances?  Son  is in charge Any changes in appetite?   denies Patient have trouble swallowing?  denies   Does the patient cook?  Yes  Any kitchen accidents such as leaving the stove on? Patient denies   Any headaches?  denies   Chronic back pain? Endorsed  Ambulates with difficulty? Denies     Recent falls or head injuries? denies     Vision changes?  She has a history of macular degeneration. Unilateral weakness, numbness or tingling?  denies   Any tremors?  denies  Any anosmia?  denies   Any incontinence of urine?  History of urge incontinence, recurrent UTIs  Any bowel dysfunction? denies      Patient lives alone  History of heavy alcohol intake? denies   History of heavy tobacco use? denies   Family history of dementia? Her mother had dementia  Does patient drive?in [place] only because she is afraid to get lost.  "I do not like to drive anywhere else ".   A few months she did not know where she was, so she took  her time and tried to stay calm, however she found herself  in the wrong side of the road.,  "All ended up okay "for   Recent labs March 2024 A1c 6.1, TSH 10.3 MRI of the brain on 2021 personally reviewed performed due to memory changes and dizziness was negative for acute intracranial abnormality, with mild parenchymal volume loss, and mild to moderate chronic small vessel ischemia.  MRA of the head was normal.  Allergies  Allergen Reactions   Amoxicillin-Pot Clavulanate Rash   Atenolol Rash   Penicillins Rash    Tolerated Cephalosporin Date: 02/17/21.     Sulfonamide Derivatives Rash    Current Outpatient Medications  Medication Instructions   acetaminophen (TYLENOL) 1,000 mg, Oral, Every 6 hours PRN   albuterol (VENTOLIN HFA) 108 (90 Base) MCG/ACT inhaler 2 puffs, Inhalation, Every 6 hours PRN   alendronate (FOSAMAX) 70 MG tablet TAKE 1 TABLET EVERY WEEK IN THE MORNING 30 MIN BEFORE EATING WITH AN 8OZ GLASS OF WATER (SIT UP 30 MIN)   atorvastatin (LIPITOR) 80 mg, Oral, Daily   blood glucose meter kit and supplies KIT Dispense based on patient and insurance preference. Use up to four times daily as directed.   buPROPion (WELLBUTRIN XL) 300 mg, Oral, Daily   clonazePAM (KLONOPIN) 0.25-0.5 mg, Oral, 3 times daily   DEXILANT 60 MG capsule Take 1 capsule (60 mg total) by mouth daily.   diltiazem (CARDIZEM CD) 240 MG 24 hr capsule TAKE (1) CAPSULE BY MOUTH ONCE DAILY.   donepezil (ARICEPT) 5 mg, Oral, Daily at bedtime   DULoxetine (CYMBALTA) 60 mg, Oral, 2 times daily   Lancets (ONETOUCH DELICA PLUS LANCET33G) MISC USE TO TEST BLOOD SUGAR TWICE DAILY.   levothyroxine (SYNTHROID) 137 mcg, Oral, Daily   losartan (COZAAR) 50 mg, Oral, 2 times daily   LYBALVI 5-10 MG TABS 1 tablet, Oral, Daily at bedtime   metFORMIN (GLUCOPHAGE) 500 mg, Oral, 2 times daily with meals   Multiple Vitamins-Minerals (ICAPS AREDS 2 PO) 1 capsule, Oral, 2 times daily   Myrbetriq 25 mg, Oral, Daily   Na  Sulfate-K Sulfate-Mg Sulf 17.5-3.13-1.6 GM/177ML SOLN As directed   ondansetron (ZOFRAN) 4 mg, Oral, Every 8 hours PRN   ONETOUCH ULTRA TEST test strip USE TO TEST BLOOD SUGAR TWICE DAILY.   pantoprazole (PROTONIX) 40 MG tablet TAKE 1 TABLET 30 MINUTES BEFORE BREAKFAST.     VITALS:   Vitals:   07/01/22 1316  BP: 139/70  Pulse: 96  Resp: 18  SpO2: 98%  Weight: 190 lb (86.2 kg)  Height: 5\' 2"  (1.575 m)     PHYSICAL EXAM   HEENT:  Normocephalic, atraumatic. The mucous membranes are moist. The superficial temporal arteries are without ropiness or tenderness. Cardiovascular: Regular rate and rhythm. Lungs: Clear to auscultation bilaterally. Neck: There are no carotid bruits noted bilaterally.  NEUROLOGICAL:    07/01/2022    4:00 PM 02/19/2014    2:11 PM  Montreal Cognitive  Assessment   Visuospatial/ Executive (0/5) 1 2  Naming (0/3) 2 2  Attention: Read list of digits (0/2) 2 2  Attention: Read list of letters (0/1) 1 1  Attention: Serial 7 subtraction starting at 100 (0/3) 1 2  Language: Repeat phrase (0/2) 2 2  Language : Fluency (0/1) 1 1  Abstraction (0/2) 2 1  Delayed Recall (0/5) 1 2  Orientation (0/6) 6 5  Total 19 20  Adjusted Score (based on education) 20 21       06/03/2022    2:39 PM  MMSE - Mini Mental State Exam  Orientation to time 4  Orientation to Place 5  Registration 3  Attention/ Calculation 3  Recall 2  Language- name 2 objects 2  Language- repeat 1  Language- follow 3 step command 3  Language- read & follow direction 1  Write a sentence 1  Copy design 0  Total score 25     Orientation:  Alert and oriented to person, place and time. No aphasia or dysarthria. Fund of knowledge is appropriate. Recent memory impaired and remote memory intact.  Attention and concentration are mildly reduced.  Able to name objects and repeat phrases. Delayed recall 1/5 Cranial nerves: There is good facial symmetry. Extraocular muscles are intact and visual fields  are full to confrontational testing. Speech is fluent and clear. no tongue deviation. Hearing is slightly decreased to conversational tone.  Tone: Tone is good throughout. Sensation: Sensation is intact to light touch and pinprick throughout. Vibration is intact at the bilateral big toe.There is no extinction with double simultaneous stimulation.   Coordination: The patient has no difficulty with RAM's or FNF bilaterally. Normal finger to nose  Motor: Strength is 5/5 in the bilateral upper and lower extremities. There is no pronator drift. There are no fasciculations noted. DTR's: Deep tendon reflexes are 2/4 at the bilateral biceps, triceps, brachioradialis, patella and achilles.  Plantar responses are downgoing bilaterally. Gait and Station: The patient is able to ambulate without difficulty.The patient is able to ambulate in a tandem fashion, able to stand in the Romberg position.     Thank you for allowing [place] the opportunity to participate in the care of this nice patient. Please do not hesitate to contact us for any questions or concerns.   Total time spent on today's visit was 54 minutes dedicated to this patient today, preparing to see patient, examining the patient, ordering tests and/or medications and counseling the patient, documenting clinical information in the EHR or other health record, independently interpreting results and communicating results to the patient/family, discussing treatment and goals, answering patient's questions and coordinating care.  Cc:  Anabel Halon, MD  Marlowe Kays 07/01/2022 4:30 PM

## 2022-07-02 ENCOUNTER — Telehealth: Payer: Self-pay | Admitting: Internal Medicine

## 2022-07-02 ENCOUNTER — Other Ambulatory Visit: Payer: Self-pay | Admitting: Internal Medicine

## 2022-07-02 ENCOUNTER — Other Ambulatory Visit: Payer: Self-pay | Admitting: Cardiology

## 2022-07-02 LAB — VITAMIN B12: Vitamin B-12: 941 pg/mL — ABNORMAL HIGH (ref 211–911)

## 2022-07-02 NOTE — Telephone Encounter (Signed)
FYI, patient called left a voicemail while office out to lunch that she forgot Dr Allena Katz told her not to increase the urine medicine (patient forgot the name of it), so she called the urologist here in [place] and cancelled her appointment with them, but would like to continue seeing Dr Allena Katz.

## 2022-07-02 NOTE — Progress Notes (Signed)
B12 is normal, thanks

## 2022-07-05 ENCOUNTER — Encounter: Payer: Self-pay | Admitting: Internal Medicine

## 2022-07-06 ENCOUNTER — Other Ambulatory Visit: Payer: Self-pay | Admitting: Internal Medicine

## 2022-07-07 ENCOUNTER — Encounter: Payer: Self-pay | Admitting: Internal Medicine

## 2022-07-07 ENCOUNTER — Ambulatory Visit (INDEPENDENT_AMBULATORY_CARE_PROVIDER_SITE_OTHER): Payer: PPO | Admitting: Internal Medicine

## 2022-07-07 VITALS — BP 138/80 | HR 77 | Ht 62.0 in | Wt 187.2 lb

## 2022-07-07 DIAGNOSIS — R61 Generalized hyperhidrosis: Secondary | ICD-10-CM | POA: Diagnosis not present

## 2022-07-07 DIAGNOSIS — G4733 Obstructive sleep apnea (adult) (pediatric): Secondary | ICD-10-CM | POA: Diagnosis not present

## 2022-07-07 DIAGNOSIS — E890 Postprocedural hypothyroidism: Secondary | ICD-10-CM

## 2022-07-07 NOTE — Patient Instructions (Signed)
You are being scheduled for sleep study.  Please continue to take medications as prescribed.  Please continue to follow low carb diet and perform moderate exercise/walking as tolerated.

## 2022-07-08 ENCOUNTER — Encounter: Payer: Self-pay | Admitting: Nurse Practitioner

## 2022-07-09 NOTE — Assessment & Plan Note (Signed)
Has snoring, daytime fatigue and an apneic episode STOP-BANG: 6 At high risk of moderate to severe OSA Check home sleep study Focus on weight loss by following low-carb diet

## 2022-07-09 NOTE — Progress Notes (Signed)
Acute Office Visit  Subjective:    Patient ID: [name], female    DOB: September 09, 1945, 77 y.o.   MRN: [id number]  Chief Complaint  Patient presents with   Shortness of Breath    Patient experienced shortness of breath over the weekend.     HPI Patient is in today for an episode of shortness of breath at nighttime about 2 days ago.  She woke up from sleep gasping for air.  She felt short of breath for few seconds and was able to go back to sleep later.  She denied any chest pain or palpitations at the time.  Of note, she reports snoring and daytime fatigue as well, which are chronic.  She has wide neck circumference - 43 cm.  She denies any known previous episode of apnea.  Past Medical History:  Diagnosis Date   Allergic rhinitis    Arthritis    B12 deficiency    Bipolar disorder (HCC)    Coronary artery disease    Depression    Diabetes mellitus (HCC)    Diabetes mellitus (HCC) 10/05/2021   Essential hypertension    GERD (gastroesophageal reflux disease)    Hashimoto's thyroiditis    History of transient ischemic attack (TIA)    Or possibly migraine as well as right amaurosis fugax and ataxia - Dr. Sandria Manly   Hyperlipidemia    Hypothyroidism    PSVT (paroxysmal supraventricular tachycardia)    Right bundle branch block     Past Surgical History:  Procedure Laterality Date   CATARACT EXTRACTION W/PHACO Left 05/23/2020   Procedure: CATARACT EXTRACTION PHACO AND INTRAOCULAR LENS PLACEMENT LEFT EYE;  Surgeon: Fabio Pierce, MD;  Location: [facility];  Service: Ophthalmology;  Laterality: Left;  left CDE=15.69   CATARACT EXTRACTION W/PHACO Right 06/13/2020   Procedure: CATARACT EXTRACTION PHACO AND INTRAOCULAR LENS PLACEMENT RIGHT EYE;  Surgeon: Fabio Pierce, MD;  Location: [facility];  Service: Ophthalmology;  Laterality: Right;  right CDE=11.33   COLONOSCOPY WITH PROPOFOL N/A 04/22/2022   Procedure: COLONOSCOPY WITH PROPOFOL;  Surgeon: Corbin Ade, MD;  Location: [facility];  Service: Endoscopy;  Laterality: N/A;  8:30 am, asa 3   LAPAROSCOPIC CHOLECYSTECTOMY  2010   POLYPECTOMY  04/22/2022   Procedure: POLYPECTOMY INTESTINAL;  Surgeon: Corbin Ade, MD;  Location: [facility];  Service: Endoscopy;;   THYROIDECTOMY  2009   TOTAL KNEE ARTHROPLASTY Left 02/16/2021   Procedure: TOTAL KNEE ARTHROPLASTY;  Surgeon: Ollen Gross, MD;  Location: [facility];  Service: Orthopedics;  Laterality: Left;    Family History  Problem Relation Age of Onset   Stroke Mother    Thyroid disease Mother    Depression Father    Hypertension Sister    Aneurysm Sister    Diabetes Brother    Heart attack Brother    Hypertension Brother    Heart failure Brother    Thyroid disease Brother    Stroke Maternal Grandmother    Heart failure Maternal Grandmother    Heart failure Maternal Grandfather    Stroke Maternal Grandfather    Heart failure Paternal Grandmother    Cancer - Colon Neg Hx     Social History   Socioeconomic History   Marital status: Widowed    Spouse name: Not on file   Number of children: 2   Years of education: 34   Highest education level: Not on file  Occupational History   Occupation: homemaker    Employer: RETIRED  Tobacco Use  Smoking status: Former    Packs/day: 0.50    Years: 10.00    Additional pack years: 0.00    Total pack years: 5.00    Types: Cigarettes    Quit date: 01/26/1983    Years since quitting: 39.4   Smokeless tobacco: Never   Tobacco comments:    quit 30 years ago  Vaping Use   Vaping Use: Never used  Substance and Sexual Activity   Alcohol use: No    Alcohol/week: 0.0 standard drinks of alcohol   Drug use: No   Sexual activity: Not Currently    Birth control/protection: Post-menopausal  Other Topics Concern   Not on file  Social History Narrative   Regular exercise: walk when ableCaffeine use: occasionally   Right handed   Drinks caffeine prn-coffee daily   One floor home   Lives alone with two cats    retired   International aid/development worker of [employer] investment [employer] Strain: Low Risk  (02/13/2021)   Overall Financial Resource Strain (CARDIA)    Difficulty of Paying Living Expenses: Not hard at all  Food Insecurity: No Food Insecurity (02/13/2021)   Hunger Vital Sign    Worried About Running Out of Food in the Last Year: Never true    Ran Out of Food in the Last Year: Never true  Transportation Needs: No Transportation Needs (02/13/2021)   PRAPARE - Administrator, Civil Service (Medical): No    Lack of Transportation (Non-Medical): No  Physical Activity: Inactive (02/13/2021)   Exercise Vital Sign    Days of Exercise per Week: 0 days    Minutes of Exercise per Session: 0 min  Stress: No Stress Concern Present (02/13/2021)   [employer] of Occupational Health - Occupational Stress Questionnaire    Feeling of Stress : Not at all  Social Connections: Moderately Isolated (02/13/2021)   Social Connection and Isolation Panel [NHANES]    Frequency of Communication with Friends and Family: More than three times a week    Frequency of Social Gatherings with Friends and Family: Never    Attends Religious Services: More than 4 times per year    Active Member of [employer] or Organizations: No    Attends Banker Meetings: Never    Marital Status: Widowed  Intimate Partner Violence: Not At Risk (02/13/2021)   Humiliation, Afraid, Rape, and Kick questionnaire    Fear of Current or Ex-Partner: No    Emotionally Abused: No    Physically Abused: No    Sexually Abused: No    Outpatient Medications Prior to Visit  Medication Sig Dispense Refill   acetaminophen (TYLENOL) 500 MG tablet Take 1,000 mg by mouth every 6 (six) hours as needed for moderate pain or headache.     albuterol (VENTOLIN HFA) 108 (90 Base) MCG/ACT inhaler Inhale 2 puffs into the lungs every 6 (six) hours as needed for wheezing or shortness of breath. 18 g 0   alendronate (FOSAMAX) 70 MG tablet TAKE 1 TABLET  EVERY WEEK IN THE MORNING 30 MIN BEFORE EATING WITH AN 8OZ GLASS OF WATER (SIT UP 30 MIN) 4 tablet 11   atorvastatin (LIPITOR) 80 MG tablet TAKE 1 TABLET BY MOUTH ONCE A DAY. 90 tablet 3   blood glucose meter kit and supplies KIT Dispense based on patient and insurance preference. Use up to four times daily as directed. 1 each 0   buPROPion (WELLBUTRIN XL) 300 MG 24 hr tablet Take 300 mg by mouth daily.  clonazePAM (KLONOPIN) 0.5 MG tablet Take 0.25-0.5 mg by mouth in the morning, at noon, and at bedtime.     DEXILANT 60 MG capsule Take 1 capsule (60 mg total) by mouth daily. 30 capsule 0   diltiazem (CARDIZEM CD) 240 MG 24 hr capsule TAKE (1) CAPSULE BY MOUTH ONCE DAILY. 30 capsule 0   donepezil (ARICEPT) 5 MG tablet Take 1 tablet (5 mg total) by mouth at bedtime. 90 tablet 0   DULoxetine (CYMBALTA) 60 MG capsule Take 60 mg by mouth 2 (two) times daily.      Lancets (ONETOUCH DELICA PLUS LANCET33G) MISC USE TO TEST BLOOD SUGAR TWICE DAILY. 100 each 0   levothyroxine (SYNTHROID) 137 MCG tablet Take 1 tablet (137 mcg total) by mouth daily. (Patient taking differently: Take 137 mcg by mouth daily.) 90 tablet 1   losartan (COZAAR) 50 MG tablet Take 1 tablet (50 mg total) by mouth 2 (two) times daily. 60 tablet 5   LYBALVI 5-10 MG TABS Take 1 tablet by mouth at bedtime.     metFORMIN (GLUCOPHAGE) 500 MG tablet Take 1 tablet (500 mg total) by mouth 2 (two) times daily with a meal. 60 tablet 5   Multiple Vitamins-Minerals (ICAPS AREDS 2 PO) Take 1 capsule by mouth 2 (two) times daily.     MYRBETRIQ 25 MG TB24 tablet Take 1 tablet (25 mg total) by mouth daily. 30 tablet 0   Na Sulfate-K Sulfate-Mg Sulf 17.5-3.13-1.6 GM/177ML SOLN As directed 354 mL 0   ondansetron (ZOFRAN) 4 MG tablet Take 1 tablet (4 mg total) by mouth every 8 (eight) hours as needed for nausea or vomiting. 20 tablet 0   ONETOUCH ULTRA TEST test strip USE TO TEST BLOOD SUGAR TWICE DAILY. 50 strip 0   pantoprazole (PROTONIX) 40 MG  tablet TAKE 1 TABLET 30 MINUTES BEFORE BREAKFAST. 30 tablet 0   No facility-administered medications prior to visit.    Allergies  Allergen Reactions   Amoxicillin-Pot Clavulanate Rash   Atenolol Rash   Penicillins Rash    Tolerated Cephalosporin Date: 02/17/21.     Sulfonamide Derivatives Rash    Review of Systems  Constitutional:  Positive for fatigue. Negative for chills and fever.  HENT:  Negative for congestion, sinus pressure and sinus pain.   Eyes:  Negative for pain and discharge.  Respiratory:  Positive for shortness of breath. Negative for cough.   Cardiovascular:  Negative for chest pain and palpitations.  Gastrointestinal:  Negative for diarrhea, nausea and vomiting.  Genitourinary:  Negative for dysuria and hematuria.  Musculoskeletal:  Negative for neck pain and neck stiffness.  Skin:  Negative for rash.  Neurological:  Negative for dizziness and syncope.  Psychiatric/Behavioral:  Positive for confusion. Negative for agitation and behavioral problems. The patient is nervous/anxious.        Objective:    Physical Exam Vitals reviewed.  Constitutional:      General: She is not in acute distress.    Appearance: She is obese. She is not diaphoretic.  HENT:     Head: Normocephalic and atraumatic.     Nose: No congestion.     Mouth/Throat:     Mouth: Mucous membranes are moist.     Pharynx: No posterior oropharyngeal erythema.  Eyes:     General: No scleral icterus.    Extraocular Movements: Extraocular movements intact.  Cardiovascular:     Rate and Rhythm: Normal rate and regular rhythm.     Pulses: Normal pulses.  Heart sounds: Normal heart sounds. No murmur heard. Pulmonary:     Breath sounds: Normal breath sounds. No wheezing or rales.  Musculoskeletal:     Cervical back: Neck supple. No tenderness.     Right lower leg: No edema.     Left lower leg: No edema.  Skin:    General: Skin is warm.     Findings: No rash.  Neurological:      General: No focal deficit present.     Mental Status: She is alert and oriented to person, place, and time.  Psychiatric:        Mood and Affect: Mood normal.        Behavior: Behavior normal.     BP 138/80 (BP Location: Right Arm, Cuff Size: Normal)   Pulse 77   Ht 5\' 2"  (1.575 m)   Wt 187 lb 3.2 oz (84.9 kg)   SpO2 91%   BMI 34.24 kg/m  Wt Readings from Last 3 Encounters:  07/07/22 187 lb 3.2 oz (84.9 kg)  07/01/22 190 lb (86.2 kg)  06/03/22 195 lb (88.5 kg)        Assessment & Plan:   Problem List Items Addressed This Visit       Respiratory   OSA (obstructive sleep apnea) - Primary    Has snoring, daytime fatigue and an apneic episode STOP-BANG: 6 At high risk of moderate to severe OSA Check home sleep study Focus on weight loss by following low-carb diet      Relevant Orders   Home sleep test     Endocrine   Postoperative hypothyroidism    Lab Results  Component Value Date   TSH 10.300 (H) 06/03/2022  On Levothyroxine 137 mcg QD recently, was adjusted after last check of TSH Has sweating recently, checked TSH and free T4 Referred to endocrinology        Other   Excessive sweating (Chronic)    Unclear etiology currently, checked TSH and free T4 - TSH low, referred to Endocrinology, but unlikely to be the reason for excessive sweating May have to adjust psychiatric medications -currently on Wellbutrin 300 mg daily, Cymbalta 60 mg twice daily and Lybalvi Needs to check blood glucose when she has episode, could be hypoglycemia as well        No orders of the defined types were placed in this encounter.    Anabel Halon, MD

## 2022-07-09 NOTE — Assessment & Plan Note (Addendum)
Lab Results  Component Value Date   TSH 10.300 (H) 06/03/2022   On Levothyroxine 137 mcg QD recently, was adjusted after last check of TSH Has sweating recently, checked TSH and free T4 Referred to endocrinology

## 2022-07-09 NOTE — Assessment & Plan Note (Signed)
Unclear etiology currently, checked TSH and free T4 - TSH low, referred to Endocrinology, but unlikely to be the reason for excessive sweating May have to adjust psychiatric medications -currently on Wellbutrin 300 mg daily, Cymbalta 60 mg twice daily and Lybalvi Needs to check blood glucose when she has episode, could be hypoglycemia as well

## 2022-07-12 ENCOUNTER — Telehealth: Payer: Self-pay | Admitting: Internal Medicine

## 2022-07-12 ENCOUNTER — Other Ambulatory Visit: Payer: Self-pay

## 2022-07-12 DIAGNOSIS — G4733 Obstructive sleep apnea (adult) (pediatric): Secondary | ICD-10-CM

## 2022-07-12 NOTE — Telephone Encounter (Signed)
Pt called in regard to sleep study . Patient states that she would rather have sleep study done at [facility].   Wants a call back in regard.

## 2022-07-13 ENCOUNTER — Ambulatory Visit: Payer: Medicare Other | Admitting: Nurse Practitioner

## 2022-07-13 DIAGNOSIS — F429 Obsessive-compulsive disorder, unspecified: Secondary | ICD-10-CM | POA: Diagnosis not present

## 2022-07-13 DIAGNOSIS — F3181 Bipolar II disorder: Secondary | ICD-10-CM | POA: Diagnosis not present

## 2022-07-13 NOTE — Patient Instructions (Signed)

## 2022-07-14 ENCOUNTER — Encounter: Payer: Self-pay | Admitting: Nurse Practitioner

## 2022-07-14 ENCOUNTER — Ambulatory Visit: Payer: PPO | Admitting: Nurse Practitioner

## 2022-07-14 VITALS — BP 127/74 | HR 67 | Ht 62.0 in | Wt 186.6 lb

## 2022-07-14 DIAGNOSIS — E890 Postprocedural hypothyroidism: Secondary | ICD-10-CM | POA: Diagnosis not present

## 2022-07-14 NOTE — Progress Notes (Signed)
Endocrinology Consult Note                                         07/14/2022, 3:35 PM  Subjective:   Subjective    [name] is a 77 y.o.-year-old female patient being seen in consultation for hypothyroidism referred by Anabel Halon, MD.     Past Medical History:  Diagnosis Date   Allergic rhinitis    Arthritis    B12 deficiency    Bipolar disorder (HCC)    Coronary artery disease    Depression    Diabetes mellitus (HCC)    Diabetes mellitus (HCC) 10/05/2021   Essential hypertension    GERD (gastroesophageal reflux disease)    Hashimoto's thyroiditis    History of transient ischemic attack (TIA)    Or possibly migraine as well as right amaurosis fugax and ataxia - Dr. Sandria Manly   Hyperlipidemia    Hypothyroidism    PSVT (paroxysmal supraventricular tachycardia)    Right bundle branch block     Past Surgical History:  Procedure Laterality Date   CATARACT EXTRACTION W/PHACO Left 05/23/2020   Procedure: CATARACT EXTRACTION PHACO AND INTRAOCULAR LENS PLACEMENT LEFT EYE;  Surgeon: Fabio Pierce, MD;  Location: [facility];  Service: Ophthalmology;  Laterality: Left;  left CDE=15.69   CATARACT EXTRACTION W/PHACO Right 06/13/2020   Procedure: CATARACT EXTRACTION PHACO AND INTRAOCULAR LENS PLACEMENT RIGHT EYE;  Surgeon: Fabio Pierce, MD;  Location: [facility];  Service: Ophthalmology;  Laterality: Right;  right CDE=11.33   COLONOSCOPY WITH PROPOFOL N/A 04/22/2022   Procedure: COLONOSCOPY WITH PROPOFOL;  Surgeon: Corbin Ade, MD;  Location: [facility];  Service: Endoscopy;  Laterality: N/A;  8:30 am, asa 3   LAPAROSCOPIC CHOLECYSTECTOMY  2010   POLYPECTOMY  04/22/2022   Procedure: POLYPECTOMY INTESTINAL;  Surgeon: Corbin Ade, MD;  Location: [facility];  Service: Endoscopy;;   THYROIDECTOMY  2009   TOTAL KNEE ARTHROPLASTY Left 02/16/2021   Procedure: TOTAL KNEE ARTHROPLASTY;  Surgeon: Ollen Gross, MD;   Location: [facility];  Service: Orthopedics;  Laterality: Left;    Social History   Socioeconomic History   Marital status: Widowed    Spouse name: Not on file   Number of children: 2   Years of education: 81   Highest education level: Not on file  Occupational History   Occupation: homemaker    Employer: RETIRED  Tobacco Use   Smoking status: Former    Packs/day: 0.50    Years: 10.00    Additional pack years: 0.00    Total pack years: 5.00    Types: Cigarettes    Quit date: 01/26/1983    Years since quitting: 39.4   Smokeless tobacco: Never   Tobacco comments:    quit 30 years ago  Vaping Use   Vaping Use: Never used  Substance and Sexual Activity   Alcohol use: No    Alcohol/week: 0.0 standard drinks of alcohol   Drug use: No   Sexual activity: Not Currently  Birth control/protection: Post-menopausal  Other Topics Concern   Not on file  Social History Narrative   Regular exercise: walk when ableCaffeine use: occasionally   Right handed   Drinks caffeine prn-coffee daily   One floor home   Lives alone with two cats   retired   Chief Executive Officer Determinants of Corporate investment banker Strain: Low Risk  (02/13/2021)   Overall Financial Resource Strain (CARDIA)    Difficulty of Paying Living Expenses: Not hard at all  Food Insecurity: No Food Insecurity (02/13/2021)   Hunger Vital Sign    Worried About Running Out of Food in the Last Year: Never true    Ran Out of Food in the Last Year: Never true  Transportation Needs: No Transportation Needs (02/13/2021)   PRAPARE - Administrator, Civil Service (Medical): No    Lack of Transportation (Non-Medical): No  Physical Activity: Inactive (02/13/2021)   Exercise Vital Sign    Days of Exercise per Week: 0 days    Minutes of Exercise per Session: 0 min  Stress: No Stress Concern Present (02/13/2021)   [employer] of Occupational Health - Occupational Stress Questionnaire    Feeling of Stress : Not at all   Social Connections: Moderately Isolated (02/13/2021)   Social Connection and Isolation Panel [NHANES]    Frequency of Communication with Friends and Family: More than three times a week    Frequency of Social Gatherings with Friends and Family: Never    Attends Religious Services: More than 4 times per year    Active Member of [employer] or Organizations: No    Attends Banker Meetings: Never    Marital Status: Widowed    Family History  Problem Relation Age of Onset   Stroke Mother    Thyroid disease Mother    Depression Father    Hypertension Sister    Aneurysm Sister    Diabetes Brother    Heart attack Brother    Hypertension Brother    Heart failure Brother    Thyroid disease Brother    Stroke Maternal Grandmother    Heart failure Maternal Grandmother    Heart failure Maternal Grandfather    Stroke Maternal Grandfather    Heart failure Paternal Grandmother    Cancer - Colon Neg Hx     Outpatient Encounter Medications as of 07/14/2022  Medication Sig   acetaminophen (TYLENOL) 500 MG tablet Take 1,000 mg by mouth every 6 (six) hours as needed for moderate pain or headache.   albuterol (VENTOLIN HFA) 108 (90 Base) MCG/ACT inhaler Inhale 2 puffs into the lungs every 6 (six) hours as needed for wheezing or shortness of breath.   alendronate (FOSAMAX) 70 MG tablet TAKE 1 TABLET EVERY WEEK IN THE MORNING 30 MIN BEFORE EATING WITH AN 8OZ GLASS OF WATER (SIT UP 30 MIN)   atorvastatin (LIPITOR) 80 MG tablet TAKE 1 TABLET BY MOUTH ONCE A DAY.   blood glucose meter kit and supplies KIT Dispense based on patient and insurance preference. Use up to four times daily as directed.   buPROPion (WELLBUTRIN XL) 300 MG 24 hr tablet Take 300 mg by mouth daily.    clonazePAM (KLONOPIN) 0.5 MG tablet Take 0.25-0.5 mg by mouth in the morning, at noon, and at bedtime.   diltiazem (CARDIZEM CD) 240 MG 24 hr capsule TAKE (1) CAPSULE BY MOUTH ONCE DAILY.   donepezil (ARICEPT) 5 MG tablet Take  1 tablet (5 mg total) by mouth at bedtime.  DULoxetine (CYMBALTA) 60 MG capsule Take 60 mg by mouth 2 (two) times daily.    ferrous sulfate 325 (65 FE) MG tablet Take 325 mg by mouth daily with breakfast.   Lancets (ONETOUCH DELICA PLUS LANCET33G) MISC USE TO TEST BLOOD SUGAR TWICE DAILY.   levothyroxine (SYNTHROID) 137 MCG tablet Take 1 tablet (137 mcg total) by mouth daily. (Patient taking differently: Take 137 mcg by mouth daily.)   losartan (COZAAR) 50 MG tablet Take 1 tablet (50 mg total) by mouth 2 (two) times daily.   LYBALVI 5-10 MG TABS Take 1 tablet by mouth at bedtime.   metFORMIN (GLUCOPHAGE) 500 MG tablet Take 1 tablet (500 mg total) by mouth 2 (two) times daily with a meal.   Multiple Vitamins-Minerals (ICAPS AREDS 2 PO) Take 1 capsule by mouth 2 (two) times daily.   MYRBETRIQ 25 MG TB24 tablet Take 1 tablet (25 mg total) by mouth daily.   Na Sulfate-K Sulfate-Mg Sulf 17.5-3.13-1.6 GM/177ML SOLN As directed   ondansetron (ZOFRAN) 4 MG tablet Take 1 tablet (4 mg total) by mouth every 8 (eight) hours as needed for nausea or vomiting.   ONETOUCH ULTRA TEST test strip USE TO TEST BLOOD SUGAR TWICE DAILY.   pantoprazole (PROTONIX) 40 MG tablet TAKE 1 TABLET 30 MINUTES BEFORE BREAKFAST.   [DISCONTINUED] DEXILANT 60 MG capsule Take 1 capsule (60 mg total) by mouth daily.   No facility-administered encounter medications on file as of 07/14/2022.    ALLERGIES: Allergies  Allergen Reactions   Amoxicillin-Pot Clavulanate Rash   Atenolol Rash   Penicillins Rash    Tolerated Cephalosporin Date: 02/17/21.     Sulfonamide Derivatives Rash   VACCINATION STATUS: Immunization History  Administered Date(s) Administered   Fluad Quad(high Dose 65+) 10/30/2020, 10/05/2021   Influenza,inj,quad, With Preservative 10/26/2018   Moderna Sars-Covid-2 Vaccination 02/26/2019, 03/19/2019   Pneumococcal Conjugate-13 04/21/2020   Pneumococcal Polysaccharide-23 05/26/2021   Tdap 06/10/2021    Zoster Recombinat (Shingrix) 06/10/2021, 10/07/2021     HPI   [name]  is a patient with the above medical history. she was diagnosed with hypothyroidism at approximate age of 53 years (after total thyroidectomy- done after abnormal biopsy-determined benign), which required subsequent initiation of thyroid hormone replacement therapy. she was given various doses of Levothyroxine over the years, currently prescribed 137 micrograms.  She notes she has NOT been taking this medication at all recently, had forgotten about it.  She does have memory impairment and lives alone.  She states her son lives nearby and daughter lives in [place] and that they were discussing whether or not to hire some help for her in the house 3 days a week.  She does not have any routine schedule with her day.  She receives the majority of her medication in pill packs.  Daughter did share concerns via MyChart regarding timing of her medications and her current symptoms as well.  I reviewed patient's thyroid tests:  Lab Results  Component Value Date   TSH 10.300 (H) 06/03/2022   TSH 8.640 (H) 03/30/2022   TSH 17.800 (H) 02/01/2022   TSH 1.390 10/05/2021   TSH 0.333 (L) 07/01/2021   TSH 0.716 05/26/2021   TSH 1.100 01/14/2021   TSH 6.640 (H) 10/30/2020   TSH 10.400 (H) 08/20/2020   TSH 2.600 04/11/2020   FREET4 1.10 06/03/2022   FREET4 1.45 03/30/2022   FREET4 1.17 02/01/2022   FREET4 1.59 10/05/2021   FREET4 2.25 (H) 07/01/2021   FREET4 1.88 (H) 05/26/2021  FREET4 1.63 01/14/2021   FREET4 1.30 10/30/2020   FREET4 0.99 08/20/2020   FREET4 1.53 04/11/2020     Pt describes: - excessive sweating - fatigue - depression - constipation  Pt denies feeling nodules in neck, hoarseness, dysphagia/odynophagia, SOB with lying down.  She did have thyroid ultrasound 1 year post total thyroidectomy and no residual tissue was noted.  I reviewed her chart and she also has a history of OSA, A Flutter, GERD,  IBS, dementia, osteoporosis, OA, Bipolar disorder, iron deficiency anemia, HLD and low vitamin D.   ROS:  Constitutional: no weight gain/loss, + fatigue, no subjective hyperthermia, no subjective hypothermia, + memory impairment (sees neurology for dementia) Eyes: no blurry vision, no xerophthalmia ENT: no sore throat, no nodules palpated in throat, no dysphagia/odynophagia, no hoarseness Cardiovascular: no chest pain, no SOB, no palpitations, no leg swelling Respiratory: no cough, no SOB Gastrointestinal: no nausea/vomiting/diarrhea, + intermittent constipation Musculoskeletal: no muscle/joint aches Skin: no rashes, + excessive sweating Neurological: no tremors, no numbness, no tingling, no dizziness Psychiatric: no depression, no anxiety (hx Bipolar disorder- sees psych)   Objective:   Objective     BP 127/74 (BP Location: Left Arm, Patient Position: Sitting, Cuff Size: Large)   Pulse 67   Ht 5\' 2"  (1.575 m)   Wt 186 lb 9.6 oz (84.6 kg)   BMI 34.13 kg/m  Wt Readings from Last 3 Encounters:  07/14/22 186 lb 9.6 oz (84.6 kg)  07/07/22 187 lb 3.2 oz (84.9 kg)  07/01/22 190 lb (86.2 kg)    BP Readings from Last 3 Encounters:  07/14/22 127/74  07/07/22 138/80  07/01/22 139/70     Constitutional:  Body mass index is 34.13 kg/m., not in acute distress, forgetful Eyes: PERRLA, EOMI, no exophthalmos ENT: moist mucous membranes, no thyromegaly, no cervical lymphadenopathy Cardiovascular: normal precordial activity, RRR, no murmur/rubs/gallops Respiratory:  adequate breathing efforts, no gross chest deformity, Clear to auscultation bilaterally Gastrointestinal: abdomen soft, non-tender, no distension, bowel sounds present Musculoskeletal: no gross deformities, strength intact in all four extremities Skin: moist, warm, no rashes Neurological: no tremor with outstretched hands, deep tendon reflexes normal in BLE.   CMP ( most recent) CMP     Component Value Date/Time   NA  138 06/03/2022 1438   K 4.8 06/03/2022 1438   CL 97 06/03/2022 1438   CO2 26 06/03/2022 1438   GLUCOSE 91 06/03/2022 1438   GLUCOSE 113 (H) 04/21/2022 0909   BUN 10 06/03/2022 1438   CREATININE 0.66 06/03/2022 1438   CREATININE 0.62 07/23/2014 1548   CALCIUM 9.8 06/03/2022 1438   PROT 7.7 04/21/2022 0909   PROT 6.7 03/30/2022 1523   ALBUMIN 4.5 04/21/2022 0909   ALBUMIN 4.4 03/30/2022 1523   AST 33 04/21/2022 0909   ALT 41 04/21/2022 0909   ALKPHOS 79 04/21/2022 0909   BILITOT 0.8 04/21/2022 0909   BILITOT 0.4 03/30/2022 1523   EGFR 91 06/03/2022 1438   GFRNONAA >60 04/21/2022 0909     Diabetic Labs (most recent): Lab Results  Component Value Date   HGBA1C 6.1 (H) 06/03/2022   HGBA1C 6.1 (H) 02/01/2022   HGBA1C 5.9 (H) 10/05/2021     Lipid Panel ( most recent) Lipid Panel     Component Value Date/Time   CHOL 149 05/26/2021 1619   TRIG 94 05/26/2021 1619   HDL 59 05/26/2021 1619   CHOLHDL 2.5 05/26/2021 1619   LDLCALC 73 05/26/2021 1619   LABVLDL 17 05/26/2021 1619  Lab Results  Component Value Date   TSH 10.300 (H) 06/03/2022   TSH 8.640 (H) 03/30/2022   TSH 17.800 (H) 02/01/2022   TSH 1.390 10/05/2021   TSH 0.333 (L) 07/01/2021   TSH 0.716 05/26/2021   TSH 1.100 01/14/2021   TSH 6.640 (H) 10/30/2020   TSH 10.400 (H) 08/20/2020   TSH 2.600 04/11/2020   FREET4 1.10 06/03/2022   FREET4 1.45 03/30/2022   FREET4 1.17 02/01/2022   FREET4 1.59 10/05/2021   FREET4 2.25 (H) 07/01/2021   FREET4 1.88 (H) 05/26/2021   FREET4 1.63 01/14/2021   FREET4 1.30 10/30/2020   FREET4 0.99 08/20/2020   FREET4 1.53 04/11/2020      Assessment & Plan:   ASSESSMENT / PLAN:  1. Hypothyroidism- postsurgical  Patient with long-standing hypothyroidism, on levothyroxine therapy.  She has not been taking her Levothyroxine lately, had forgotten to take them (has dementia and lives alone).  I advised her to restart taking her Levothyroxine 137 mcg po daily before  breakfast (I marked the bottle today).  This is near her weight based maximum dose of thyroid hormone.  Today, we came up with medication administration schedule so that her medications would be taken properly, ensuring proper absorption of all of her medications.  I also had my nurse reach out to [facility] to have the changes reflected in her pill packs as well.  She is concerned that she will not be able to stick to the schedule as she does not wake at any set time.  We discussed different strategies and that it may be helpful if her children call her at 7 or so in the morning to get her up and remind her to take her thyroid pill and having help in the home to make sure her medications are organized properly.  - We discussed about correct intake of levothyroxine, at fasting, with water, separated by at least 30 minutes from breakfast, and separated by more than 4 hours from calcium, iron, multivitamins, acid reflux medications (PPIs). -Patient is made aware of the fact that thyroid hormone replacement is needed for life, dose to be adjusted by periodic monitoring of thyroid function tests.  - Will check thyroid tests before next visit: TSH, free T4.  Will wait at least 6 weeks or so after taking her medication properly to see how it as affected her labs.    - Time spent with the patient: 60 minutes, of which >50% was spent in obtaining information about her symptoms, reviewing her previous labs, evaluations, and treatments, counseling her about her hypothyroidism, and developing a plan to confirm the diagnosis and long term treatment as necessary. Please refer to "Patient Self Inventory" in the Media tab for reviewed elements of pertinent patient history.  Avel Peace participated in the discussions, expressed understanding, and voiced agreement with the above plans.  All questions were answered to her satisfaction. she is encouraged to contact clinic should she have any questions or  concerns prior to her return visit.   FOLLOW UP PLAN:  Return in about 8 weeks (around 09/08/2022) for Thyroid follow up, Previsit labs.  Ronny Bacon, [facility] [address] [place], [place] [address] Phone: [phone] Fax: [phone]  07/14/2022, 3:35 PM

## 2022-07-21 ENCOUNTER — Telehealth: Payer: Self-pay | Admitting: Internal Medicine

## 2022-07-21 NOTE — Telephone Encounter (Signed)
Patient asked if Dr Allena Katz can switch her MRI from [facility] to [facility] scheduled for 07.23.2024, patient is having transportation problems.  Contact patient back with information change.

## 2022-07-26 ENCOUNTER — Other Ambulatory Visit: Payer: Self-pay | Admitting: Physician Assistant

## 2022-07-28 ENCOUNTER — Encounter: Payer: Self-pay | Admitting: Internal Medicine

## 2022-07-30 ENCOUNTER — Other Ambulatory Visit: Payer: Self-pay | Admitting: Internal Medicine

## 2022-07-30 DIAGNOSIS — I10 Essential (primary) hypertension: Secondary | ICD-10-CM

## 2022-08-09 ENCOUNTER — Other Ambulatory Visit: Payer: Self-pay | Admitting: Internal Medicine

## 2022-08-09 DIAGNOSIS — F02B18 Dementia in other diseases classified elsewhere, moderate, with other behavioral disturbance: Secondary | ICD-10-CM

## 2022-08-10 ENCOUNTER — Ambulatory Visit: Payer: Medicare Other | Admitting: Nurse Practitioner

## 2022-08-13 ENCOUNTER — Encounter: Payer: Self-pay | Admitting: Cardiology

## 2022-08-13 ENCOUNTER — Ambulatory Visit: Payer: PPO | Attending: Cardiology | Admitting: Cardiology

## 2022-08-13 VITALS — BP 136/58 | HR 78 | Ht 62.0 in | Wt 183.1 lb

## 2022-08-13 DIAGNOSIS — I471 Supraventricular tachycardia, unspecified: Secondary | ICD-10-CM

## 2022-08-13 DIAGNOSIS — R931 Abnormal findings on diagnostic imaging of heart and coronary circulation: Secondary | ICD-10-CM

## 2022-08-13 DIAGNOSIS — I10 Essential (primary) hypertension: Secondary | ICD-10-CM | POA: Diagnosis not present

## 2022-08-13 NOTE — Patient Instructions (Signed)
Medication Instructions:  Your physician recommends that you continue on your current medications as directed. Please refer to the Current Medication list given to you today.   Labwork: None today  Testing/Procedures: None today  Follow-Up: 1 year  Any Other Special Instructions Will Be Listed Below (If Applicable).  If you need a refill on your cardiac medications before your next appointment, please call your pharmacy.  

## 2022-08-13 NOTE — Progress Notes (Signed)
    Cardiology Office Note  Date: 08/13/2022   ID: [name], DOB [birth year], MRN [id number]  History of Present Illness: [name] is a 77 y.o. female last seen in July 2023 by Ms. Dunn PA-C, I reviewed the note.  She is here for a routine visit.  Overall doing well, no significant palpitations or chest discomfort and stable NYHA class II dyspnea.  I reviewed her medications.  She remains on Cardizem CD as before.  No change in antihypertensive or lipid regimen.  She continues to follow with Dr. Allena Katz for primary care.  I reviewed her interval lab work.  Also ECG reviewed from March of this year.  Physical Exam: VS:  BP (!) 136/58 (BP Location: Right Arm, Patient Position: Sitting, Cuff Size: Large)   Pulse 78   Ht 5\' 2"  (1.575 m)   Wt 183 lb 1.6 oz (83.1 kg)   SpO2 97%   BMI 33.49 kg/m , BMI Body mass index is 33.49 kg/m.  Wt Readings from Last 3 Encounters:  08/13/22 183 lb 1.6 oz (83.1 kg)  07/14/22 186 lb 9.6 oz (84.6 kg)  07/07/22 187 lb 3.2 oz (84.9 kg)    General: Patient appears comfortable at rest. HEENT: Conjunctiva and lids normal. Neck: Supple, no elevated JVP or carotid bruits. Lungs: Clear to auscultation, nonlabored breathing at rest. Cardiac: Regular rate and rhythm, no S3 or significant systolic murmur. Extremities: No pitting edema.  ECG:  An ECG dated 04/21/2022 was personally reviewed today and demonstrated:  Sinus rhythm with ICRBB and LPFB, nonspecific T wave changes.  Labwork: 04/21/2022: ALT 41; AST 33; Hemoglobin 14.5; Platelets 376 06/03/2022: BUN 10; Creatinine, Ser 0.66; Potassium 4.8; Sodium 138; TSH 10.300     Component Value Date/Time   CHOL 149 05/26/2021 1619   TRIG 94 05/26/2021 1619   HDL 59 05/26/2021 1619   CHOLHDL 2.5 05/26/2021 1619   LDLCALC 73 05/26/2021 1619   Other Studies Reviewed Today:  No interval cardiac testing for review today.  Assessment and Plan:  1.  PSVT, asymptomatic at this time on Cardizem CD  240 mg daily.  Plan to continue observation.  2.  Coronary calcium score of 86 with minimal nonobstructive coronary atherosclerosis by CT imaging in March 2022.  She is on Lipitor with plan for observation in the absence of angina.  Her last LDL was 73 with follow-up pending per PCP this year.  3.  Essential hypertension, currently on Cozaar along with Cardizem CD.  No changes made to current regimen.  Disposition:  Follow up  1 year.  Signed, Jonelle Sidle, M.D., F.A.C.C. [facility]

## 2022-08-17 ENCOUNTER — Other Ambulatory Visit: Payer: PPO

## 2022-08-17 ENCOUNTER — Telehealth: Payer: Self-pay

## 2022-08-17 ENCOUNTER — Ambulatory Visit: Payer: PPO | Admitting: Urology

## 2022-08-17 NOTE — Telephone Encounter (Signed)
Patient called and left a message. She said christy stated she missed an MRI appt and she didn't know she missed it

## 2022-08-17 NOTE — Telephone Encounter (Signed)
Patient is scheduled for MRI for August

## 2022-08-17 NOTE — Telephone Encounter (Signed)
Patient cancelled her MRI and all appts here, I left a detailed message to give the office a call.

## 2022-08-19 ENCOUNTER — Encounter: Payer: Self-pay | Admitting: Internal Medicine

## 2022-08-19 ENCOUNTER — Telehealth: Payer: Self-pay | Admitting: Internal Medicine

## 2022-08-19 ENCOUNTER — Other Ambulatory Visit: Payer: Self-pay | Admitting: Internal Medicine

## 2022-08-19 NOTE — Telephone Encounter (Signed)
Shirlee Limerick has left messages for daughter

## 2022-08-19 NOTE — Telephone Encounter (Signed)
Patient daughter called said was referral for sleep study but waited a long time and now would like it done at the hospital can a new order be done. Call patient daughter back at [phone].

## 2022-08-25 ENCOUNTER — Ambulatory Visit: Payer: PPO | Admitting: Nutrition

## 2022-08-26 ENCOUNTER — Other Ambulatory Visit: Payer: Self-pay | Admitting: Cardiology

## 2022-08-30 ENCOUNTER — Other Ambulatory Visit: Payer: Self-pay | Admitting: Internal Medicine

## 2022-08-30 DIAGNOSIS — E038 Other specified hypothyroidism: Secondary | ICD-10-CM

## 2022-08-30 DIAGNOSIS — N3941 Urge incontinence: Secondary | ICD-10-CM

## 2022-09-01 ENCOUNTER — Other Ambulatory Visit: Payer: PPO

## 2022-09-05 NOTE — Progress Notes (Signed)
Pharmacy Quality Measure Review  This patient is appearing on a report for being at risk of failing the adherence measure for diabetes medications this calendar year.   Medication: metformin 500 mg BID Last fill date: 08/19/22 for 30 day supply (1 refill remaining, PCP f/u 10/06/22)  Insurance report was not up to date. A1c controlled below goal <7%. No action needed at this time.   Nils Pyle, PharmD PGY1 Pharmacy Resident

## 2022-09-09 ENCOUNTER — Encounter: Payer: Self-pay | Admitting: Nurse Practitioner

## 2022-09-09 ENCOUNTER — Ambulatory Visit: Payer: Medicare Other | Admitting: Nurse Practitioner

## 2022-09-09 DIAGNOSIS — D225 Melanocytic nevi of trunk: Secondary | ICD-10-CM | POA: Diagnosis not present

## 2022-09-09 DIAGNOSIS — B9689 Other specified bacterial agents as the cause of diseases classified elsewhere: Secondary | ICD-10-CM | POA: Diagnosis not present

## 2022-09-09 DIAGNOSIS — E890 Postprocedural hypothyroidism: Secondary | ICD-10-CM | POA: Diagnosis not present

## 2022-09-09 DIAGNOSIS — L0202 Furuncle of face: Secondary | ICD-10-CM | POA: Diagnosis not present

## 2022-09-09 DIAGNOSIS — Z1283 Encounter for screening for malignant neoplasm of skin: Secondary | ICD-10-CM | POA: Diagnosis not present

## 2022-09-15 ENCOUNTER — Encounter: Payer: Self-pay | Admitting: Nurse Practitioner

## 2022-09-15 ENCOUNTER — Ambulatory Visit (INDEPENDENT_AMBULATORY_CARE_PROVIDER_SITE_OTHER): Payer: PPO | Admitting: Nurse Practitioner

## 2022-09-15 VITALS — BP 113/68 | HR 67 | Ht 62.0 in | Wt 181.4 lb

## 2022-09-15 DIAGNOSIS — E890 Postprocedural hypothyroidism: Secondary | ICD-10-CM

## 2022-09-15 DIAGNOSIS — E038 Other specified hypothyroidism: Secondary | ICD-10-CM

## 2022-09-15 MED ORDER — LEVOTHYROXINE SODIUM 125 MCG PO TABS
125.0000 ug | ORAL_TABLET | Freq: Every day | ORAL | 1 refills | Status: DC
Start: 2022-09-15 — End: 2022-12-27

## 2022-09-15 NOTE — Patient Instructions (Signed)

## 2022-09-15 NOTE — Progress Notes (Signed)
Endocrinology Follow Up Note                                         09/15/2022, 1:41 PM  Subjective:   Subjective    [name] is a 77 y.o.-year-old female patient being seen in follow up after being seen in consultation for hypothyroidism referred by Anabel Halon, MD.     Past Medical History:  Diagnosis Date   Allergic rhinitis    Arthritis    B12 deficiency    Bipolar disorder (HCC)    Coronary artery disease    Depression    Diabetes mellitus (HCC)    Diabetes mellitus (HCC) 10/05/2021   Essential hypertension    GERD (gastroesophageal reflux disease)    Hashimoto's thyroiditis    History of transient ischemic attack (TIA)    Or possibly migraine as well as right amaurosis fugax and ataxia - Dr. Sandria Manly   Hyperlipidemia    Hypothyroidism    PSVT (paroxysmal supraventricular tachycardia)    Right bundle branch block     Past Surgical History:  Procedure Laterality Date   CATARACT EXTRACTION W/PHACO Left 05/23/2020   Procedure: CATARACT EXTRACTION PHACO AND INTRAOCULAR LENS PLACEMENT LEFT EYE;  Surgeon: Fabio Pierce, MD;  Location: [facility];  Service: Ophthalmology;  Laterality: Left;  left CDE=15.69   CATARACT EXTRACTION W/PHACO Right 06/13/2020   Procedure: CATARACT EXTRACTION PHACO AND INTRAOCULAR LENS PLACEMENT RIGHT EYE;  Surgeon: Fabio Pierce, MD;  Location: [facility];  Service: Ophthalmology;  Laterality: Right;  right CDE=11.33   COLONOSCOPY WITH PROPOFOL N/A 04/22/2022   Procedure: COLONOSCOPY WITH PROPOFOL;  Surgeon: Corbin Ade, MD;  Location: [facility];  Service: Endoscopy;  Laterality: N/A;  8:30 am, asa 3   LAPAROSCOPIC CHOLECYSTECTOMY  2010   POLYPECTOMY  04/22/2022   Procedure: POLYPECTOMY INTESTINAL;  Surgeon: Corbin Ade, MD;  Location: [facility];  Service: Endoscopy;;   THYROIDECTOMY  2009   TOTAL KNEE ARTHROPLASTY Left 02/16/2021   Procedure: TOTAL KNEE  ARTHROPLASTY;  Surgeon: Ollen Gross, MD;  Location: [facility];  Service: Orthopedics;  Laterality: Left;    Social History   Socioeconomic History   Marital status: Widowed    Spouse name: Not on file   Number of children: 2   Years of education: 75   Highest education level: Not on file  Occupational History   Occupation: homemaker    Employer: RETIRED  Tobacco Use   Smoking status: Former    Current packs/day: 0.00    Average packs/day: 0.5 packs/day for 10.0 years (5.0 ttl pk-yrs)    Types: Cigarettes    Start date: 01/25/1973    Quit date: 01/26/1983    Years since quitting: 39.6   Smokeless tobacco: Never   Tobacco comments:    quit 30 years ago  Vaping Use   Vaping status: Never Used  Substance and Sexual Activity   Alcohol use: No    Alcohol/week: 0.0 standard drinks of alcohol   Drug  use: No   Sexual activity: Not Currently    Birth control/protection: Post-menopausal  Other Topics Concern   Not on file  Social History Narrative   Regular exercise: walk when ableCaffeine use: occasionally   Right handed   Drinks caffeine prn-coffee daily   One floor home   Lives alone with two cats   retired   Chief Executive Officer Determinants of Corporate investment banker Strain: Low Risk  (02/13/2021)   Overall Financial Resource Strain (CARDIA)    Difficulty of Paying Living Expenses: Not hard at all  Food Insecurity: No Food Insecurity (02/13/2021)   Hunger Vital Sign    Worried About Running Out of Food in the Last Year: Never true    Ran Out of Food in the Last Year: Never true  Transportation Needs: No Transportation Needs (02/13/2021)   PRAPARE - Administrator, Civil Service (Medical): No    Lack of Transportation (Non-Medical): No  Physical Activity: Inactive (02/13/2021)   Exercise Vital Sign    Days of Exercise per Week: 0 days    Minutes of Exercise per Session: 0 min  Stress: No Stress Concern Present (02/13/2021)   [employer] of Occupational Health -  Occupational Stress Questionnaire    Feeling of Stress : Not at all  Social Connections: Unknown (06/01/2021)   [facility]   Social Network    Social Network: Not on file    Family History  Problem Relation Age of Onset   Stroke Mother    Thyroid disease Mother    Depression Father    Hypertension Sister    Aneurysm Sister    Diabetes Brother    Heart attack Brother    Hypertension Brother    Heart failure Brother    Thyroid disease Brother    Stroke Maternal Grandmother    Heart failure Maternal Grandmother    Heart failure Maternal Grandfather    Stroke Maternal Grandfather    Heart failure Paternal Grandmother    Cancer - Colon Neg Hx     Outpatient Encounter Medications as of 09/15/2022  Medication Sig   acetaminophen (TYLENOL) 500 MG tablet Take 1,000 mg by mouth every 6 (six) hours as needed for moderate pain or headache.   albuterol (VENTOLIN HFA) 108 (90 Base) MCG/ACT inhaler Inhale 2 puffs into the lungs every 6 (six) hours as needed for wheezing or shortness of breath.   alendronate (FOSAMAX) 70 MG tablet TAKE 1 TABLET EVERY WEEK IN THE MORNING 30 MIN BEFORE EATING WITH AN 8OZ GLASS OF WATER (SIT UP 30 MIN)   atorvastatin (LIPITOR) 80 MG tablet TAKE 1 TABLET BY MOUTH ONCE A DAY (NIGHT).   blood glucose meter kit and supplies KIT Dispense based on patient and insurance preference. Use up to four times daily as directed.   buPROPion (WELLBUTRIN XL) 300 MG 24 hr tablet Take 300 mg by mouth daily.    clonazePAM (KLONOPIN) 0.5 MG tablet Take 0.25-0.5 mg by mouth in the morning, at noon, and at bedtime.   diltiazem (CARDIZEM CD) 240 MG 24 hr capsule TAKE (1) CAPSULE BY MOUTH ONCE DAILY.   donepezil (ARICEPT) 5 MG tablet TAKE (1) TABLET BY MOUTH AT BEDTIME.   DULoxetine (CYMBALTA) 60 MG capsule Take 60 mg by mouth 2 (two) times daily.    ferrous sulfate 325 (65 FE) MG tablet Take 325 mg by mouth daily with breakfast.   Lancets (ONETOUCH DELICA  PLUS LANCET33G) MISC USE TO TEST BLOOD  SUGAR TWICE DAILY.   losartan (COZAAR) 50 MG tablet TAKE 1 TABLET BY MOUTH TWICE DAILY (MORNING AND NIGHT).   LYBALVI 5-10 MG TABS Take 1 tablet by mouth at bedtime.   metFORMIN (GLUCOPHAGE) 500 MG tablet Take 1 tablet (500 mg total) by mouth 2 (two) times daily with a meal.   Multiple Vitamins-Minerals (ICAPS AREDS 2 PO) Take 1 capsule by mouth 2 (two) times daily.   MYRBETRIQ 25 MG TB24 tablet Take 1 tablet (25 mg total) by mouth daily (MORNING).   Na Sulfate-K Sulfate-Mg Sulf 17.5-3.13-1.6 GM/177ML SOLN As directed   ondansetron (ZOFRAN) 4 MG tablet Take 1 tablet (4 mg total) by mouth every 8 (eight) hours as needed for nausea or vomiting.   ONETOUCH ULTRA TEST test strip USE TO TEST BLOOD SUGAR TWICE DAILY.   pantoprazole (PROTONIX) 40 MG tablet TAKE 1 TABLET 30 MINUTES BEFORE BREAKFAST.   [DISCONTINUED] levothyroxine (SYNTHROID) 137 MCG tablet Take 1 tablet (137 mcg total) by mouth daily. (MORNING)   levothyroxine (SYNTHROID) 125 MCG tablet Take 1 tablet (125 mcg total) by mouth daily before breakfast.   No facility-administered encounter medications on file as of 09/15/2022.    ALLERGIES: Allergies  Allergen Reactions   Amoxicillin-Pot Clavulanate Rash   Atenolol Rash   Penicillins Rash    Tolerated Cephalosporin Date: 02/17/21.     Sulfonamide Derivatives Rash   VACCINATION STATUS: Immunization History  Administered Date(s) Administered   Fluad Quad(high Dose 65+) 10/30/2020, 10/05/2021   Influenza,inj,quad, With Preservative 10/26/2018   Moderna Sars-Covid-2 Vaccination 02/26/2019, 03/19/2019   Pneumococcal Conjugate-13 04/21/2020   Pneumococcal Polysaccharide-23 05/26/2021   Tdap 06/10/2021   Zoster Recombinant(Shingrix) 06/10/2021, 10/07/2021     HPI   [name]  is a patient with the above medical history. she was diagnosed with hypothyroidism at approximate age of 3 years (after total thyroidectomy- done after  abnormal biopsy-determined benign), which required subsequent initiation of thyroid hormone replacement therapy. she was given various doses of Levothyroxine over the years, currently prescribed 137 micrograms.    She does have memory impairment and lives alone.  She states her son lives nearby and daughter lives in [place] and that they were discussing whether or not to hire some help for her in the house 3 days a week.  She does not have any routine schedule with her day.  She receives the majority of her medication in pill packs.  I reviewed patient's thyroid tests:  Lab Results  Component Value Date   TSH 0.607 09/09/2022   TSH 10.300 (H) 06/03/2022   TSH 8.640 (H) 03/30/2022   TSH 17.800 (H) 02/01/2022   TSH 1.390 10/05/2021   TSH 0.333 (L) 07/01/2021   TSH 0.716 05/26/2021   TSH 1.100 01/14/2021   TSH 6.640 (H) 10/30/2020   TSH 10.400 (H) 08/20/2020   FREET4 2.07 (H) 09/09/2022   FREET4 1.10 06/03/2022   FREET4 1.45 03/30/2022   FREET4 1.17 02/01/2022   FREET4 1.59 10/05/2021   FREET4 2.25 (H) 07/01/2021   FREET4 1.88 (H) 05/26/2021   FREET4 1.63 01/14/2021   FREET4 1.30 10/30/2020   FREET4 0.99 08/20/2020     Pt describes: - excessive sweating - fatigue - depression - constipation  Pt denies feeling nodules in neck, hoarseness, dysphagia/odynophagia, SOB with lying down.  She did have thyroid ultrasound 1 year post total thyroidectomy and no residual tissue was noted.  I reviewed her chart and she also has a history of OSA, A Flutter, GERD, IBS, dementia, osteoporosis,  OA, Bipolar disorder, iron deficiency anemia, HLD and low vitamin D.   ROS:  Constitutional: no weight gain/loss, + fatigue-improved somewhat, no subjective hyperthermia, no subjective hypothermia, + memory impairment (sees neurology for dementia) Eyes: no blurry vision, no xerophthalmia ENT: no sore throat, no nodules palpated in throat, no dysphagia/odynophagia, no hoarseness Cardiovascular: no  chest pain, no SOB, + intermittent palpitations, no leg swelling Respiratory: no cough, no SOB Gastrointestinal: no nausea/vomiting/diarrhea, + intermittent constipation Musculoskeletal: no muscle/joint aches Skin: no rashes, + excessive sweating Neurological: no tremors, no numbness, no tingling, no dizziness Psychiatric: no depression, no anxiety (hx Bipolar disorder- sees psych)   Objective:   Objective     BP 113/68 (BP Location: Left Arm, Patient Position: Sitting, Cuff Size: Large)   Pulse 67   Ht 5\' 2"  (1.575 m)   Wt 181 lb 6.4 oz (82.3 kg)   BMI 33.18 kg/m  Wt Readings from Last 3 Encounters:  09/15/22 181 lb 6.4 oz (82.3 kg)  08/13/22 183 lb 1.6 oz (83.1 kg)  07/14/22 186 lb 9.6 oz (84.6 kg)    BP Readings from Last 3 Encounters:  09/15/22 113/68  08/13/22 (!) 136/58  07/14/22 127/74      Physical Exam- Limited  Constitutional:  Body mass index is 33.18 kg/m. , not in acute distress, normal state of mind Eyes:  EOMI, no exophthalmos Musculoskeletal: no gross deformities, strength intact in all four extremities, no gross restriction of joint movements Skin:  no rashes, no hyperemia Neurological: no tremor with outstretched hands   CMP ( most recent) CMP     Component Value Date/Time   NA 138 06/03/2022 1438   K 4.8 06/03/2022 1438   CL 97 06/03/2022 1438   CO2 26 06/03/2022 1438   GLUCOSE 91 06/03/2022 1438   GLUCOSE 113 (H) 04/21/2022 0909   BUN 10 06/03/2022 1438   CREATININE 0.66 06/03/2022 1438   CREATININE 0.62 07/23/2014 1548   CALCIUM 9.8 06/03/2022 1438   PROT 7.7 04/21/2022 0909   PROT 6.7 03/30/2022 1523   ALBUMIN 4.5 04/21/2022 0909   ALBUMIN 4.4 03/30/2022 1523   AST 33 04/21/2022 0909   ALT 41 04/21/2022 0909   ALKPHOS 79 04/21/2022 0909   BILITOT 0.8 04/21/2022 0909   BILITOT 0.4 03/30/2022 1523   EGFR 91 06/03/2022 1438   GFRNONAA >60 04/21/2022 0909     Diabetic Labs (most recent): Lab Results  Component Value Date    HGBA1C 6.1 (H) 06/03/2022   HGBA1C 6.1 (H) 02/01/2022   HGBA1C 5.9 (H) 10/05/2021     Lipid Panel ( most recent) Lipid Panel     Component Value Date/Time   CHOL 149 05/26/2021 1619   TRIG 94 05/26/2021 1619   HDL 59 05/26/2021 1619   CHOLHDL 2.5 05/26/2021 1619   LDLCALC 73 05/26/2021 1619   LABVLDL 17 05/26/2021 1619       Lab Results  Component Value Date   TSH 0.607 09/09/2022   TSH 10.300 (H) 06/03/2022   TSH 8.640 (H) 03/30/2022   TSH 17.800 (H) 02/01/2022   TSH 1.390 10/05/2021   TSH 0.333 (L) 07/01/2021   TSH 0.716 05/26/2021   TSH 1.100 01/14/2021   TSH 6.640 (H) 10/30/2020   TSH 10.400 (H) 08/20/2020   FREET4 2.07 (H) 09/09/2022   FREET4 1.10 06/03/2022   FREET4 1.45 03/30/2022   FREET4 1.17 02/01/2022   FREET4 1.59 10/05/2021   FREET4 2.25 (H) 07/01/2021   FREET4 1.88 (H) 05/26/2021   FREET4  1.63 01/14/2021   FREET4 1.30 10/30/2020   FREET4 0.99 08/20/2020     Latest Reference Range & Units 07/01/21 14:54 10/05/21 16:31 02/01/22 14:42 03/30/22 15:23 06/03/22 14:38 09/09/22 15:15  TSH 0.450 - 4.500 uIU/mL 0.333 (L) 1.390 17.800 (H) 8.640 (H) 10.300 (H) 0.607  Triiodothyronine,Free,Serum 2.0 - 4.4 pg/mL  2.5  2.8 2.1   Reverse T3, Serum 9.2 - 24.1 ng/dL  16.1      W9,UEAV(WUJWJX) 0.82 - 1.77 ng/dL 9.14 (H) 7.82 9.56 2.13 1.10 2.07 (H)  Thyroperoxidase Ab SerPl-aCnc 0 - 34 IU/mL  <9      (L): Data is abnormally low (H): Data is abnormally high  Assessment & Plan:   ASSESSMENT / PLAN:  1. Hypothyroidism- postsurgical  Patient with long-standing hypothyroidism, on levothyroxine therapy.      Her previsit thyroid function tests show improvement, consistent with taking her medication properly but now suggestive that she is on too much (she keeps her medication on her night stand now as a reminder to take it- she does still miss a dose here and there).  This is a good sign that her medication is being absorbed properly now that she is taking it correctly.   She is advised to lower her dose of Levothyroxine to 125 mcg po daily before breakfast.   - We discussed about correct intake of levothyroxine, at fasting, with water, separated by at least 30 minutes from breakfast, and separated by more than 4 hours from calcium, iron, multivitamins, acid reflux medications (PPIs). -Patient is made aware of the fact that thyroid hormone replacement is needed for life, dose to be adjusted by periodic monitoring of thyroid function tests.  - Will check thyroid tests before next visit: TSH, free T4.       I spent  18  minutes in the care of the patient today including review of labs from Thyroid Function, CMP, and other relevant labs ; imaging/biopsy records (current and previous including abstractions from other facilities); face-to-face time discussing  her lab results and symptoms, medications doses, her options of short and long term treatment based on the latest standards of care / guidelines;  and documenting the encounter.  Avel Peace  participated in the discussions, expressed understanding, and voiced agreement with the above plans.  All questions were answered to her satisfaction. she is encouraged to contact clinic should she have any questions or concerns prior to her return visit.   FOLLOW UP PLAN:  Return in about 3 months (around 12/16/2022) for Thyroid follow up, Previsit labs.  Ronny Bacon, [facility] [address] [place], [place] [address] Phone: [phone] Fax: [phone]  09/15/2022, 1:41 PM

## 2022-09-16 ENCOUNTER — Telehealth: Payer: Self-pay | Admitting: Nurse Practitioner

## 2022-09-16 NOTE — Telephone Encounter (Signed)
I called pt's  daughter because pt left a check yesterday for her payment for me to process today. The check had the year of 2004 on it and the wrong amt. I called to advise her of this, no answer, I did mail the check back to pt

## 2022-09-20 ENCOUNTER — Telehealth: Payer: Self-pay

## 2022-09-20 NOTE — Telephone Encounter (Signed)
Patient neve had MRI, cancelled all memory testing.FYI

## 2022-09-22 ENCOUNTER — Other Ambulatory Visit: Payer: Self-pay | Admitting: Internal Medicine

## 2022-09-22 DIAGNOSIS — E1169 Type 2 diabetes mellitus with other specified complication: Secondary | ICD-10-CM

## 2022-09-23 NOTE — Telephone Encounter (Signed)
Tammy, LPN said she left a VM to call her back about this. I called pt's daughter and no answer

## 2022-09-28 ENCOUNTER — Other Ambulatory Visit: Payer: Self-pay | Admitting: Internal Medicine

## 2022-09-28 DIAGNOSIS — N3941 Urge incontinence: Secondary | ICD-10-CM

## 2022-10-05 ENCOUNTER — Ambulatory Visit: Payer: PPO | Admitting: Physician Assistant

## 2022-10-06 ENCOUNTER — Ambulatory Visit (INDEPENDENT_AMBULATORY_CARE_PROVIDER_SITE_OTHER): Payer: PPO | Admitting: Internal Medicine

## 2022-10-06 ENCOUNTER — Encounter: Payer: Self-pay | Admitting: Internal Medicine

## 2022-10-06 VITALS — BP 124/66 | HR 72 | Ht 62.5 in | Wt 183.4 lb

## 2022-10-06 DIAGNOSIS — G309 Alzheimer's disease, unspecified: Secondary | ICD-10-CM | POA: Diagnosis not present

## 2022-10-06 DIAGNOSIS — E890 Postprocedural hypothyroidism: Secondary | ICD-10-CM | POA: Diagnosis not present

## 2022-10-06 DIAGNOSIS — Z23 Encounter for immunization: Secondary | ICD-10-CM

## 2022-10-06 DIAGNOSIS — Z7984 Long term (current) use of oral hypoglycemic drugs: Secondary | ICD-10-CM

## 2022-10-06 DIAGNOSIS — F02B18 Dementia in other diseases classified elsewhere, moderate, with other behavioral disturbance: Secondary | ICD-10-CM

## 2022-10-06 DIAGNOSIS — E1169 Type 2 diabetes mellitus with other specified complication: Secondary | ICD-10-CM | POA: Diagnosis not present

## 2022-10-06 DIAGNOSIS — G4733 Obstructive sleep apnea (adult) (pediatric): Secondary | ICD-10-CM

## 2022-10-06 DIAGNOSIS — N3941 Urge incontinence: Secondary | ICD-10-CM

## 2022-10-06 DIAGNOSIS — Z0001 Encounter for general adult medical examination with abnormal findings: Secondary | ICD-10-CM | POA: Diagnosis not present

## 2022-10-06 DIAGNOSIS — E782 Mixed hyperlipidemia: Secondary | ICD-10-CM | POA: Diagnosis not present

## 2022-10-06 DIAGNOSIS — I10 Essential (primary) hypertension: Secondary | ICD-10-CM | POA: Diagnosis not present

## 2022-10-06 MED ORDER — ATORVASTATIN CALCIUM 80 MG PO TABS
80.0000 mg | ORAL_TABLET | Freq: Every day | ORAL | 3 refills | Status: DC
Start: 2022-10-06 — End: 2023-10-21

## 2022-10-06 MED ORDER — MIRABEGRON ER 25 MG PO TB24
25.0000 mg | ORAL_TABLET | Freq: Every day | ORAL | 5 refills | Status: DC
Start: 2022-10-06 — End: 2023-04-28

## 2022-10-06 MED ORDER — METFORMIN HCL 500 MG PO TABS
500.0000 mg | ORAL_TABLET | Freq: Two times a day (BID) | ORAL | 5 refills | Status: DC
Start: 2022-10-06 — End: 2023-06-10

## 2022-10-06 MED ORDER — LOSARTAN POTASSIUM 50 MG PO TABS
50.0000 mg | ORAL_TABLET | Freq: Two times a day (BID) | ORAL | 5 refills | Status: DC
Start: 2022-10-06 — End: 2023-01-11

## 2022-10-06 NOTE — Assessment & Plan Note (Signed)
On atorvastatin 80 mg QD 

## 2022-10-06 NOTE — Patient Instructions (Signed)
Please continue to take medications as prescribed.  Please continue to follow low carb diet and ambulate as tolerated. 

## 2022-10-06 NOTE — Assessment & Plan Note (Signed)
Lab Results  Component Value Date   HGBA1C 6.1 (H) 06/03/2022   Associated with HTN and HLD Well-controlled On Metformin Advised to check her blood glucose when she has episodes of sweating and facial flushing-could be hypoglycemia Advised to follow diabetic diet On ARB and statin F/u CMP and lipid panel Diabetic eye exam: Advised to follow up with Ophthalmology for diabetic eye exam

## 2022-10-06 NOTE — Assessment & Plan Note (Signed)
Well-controlled with Myrbetriq

## 2022-10-06 NOTE — Assessment & Plan Note (Signed)
BP Readings from Last 1 Encounters:  10/06/22 124/66   Well-controlled with Losartan 50 mg BID and Diltiazem 240 mg QD Counseled for compliance with the medications Advised DASH diet and moderate exercise/walking as tolerated

## 2022-10-06 NOTE — Assessment & Plan Note (Signed)
Physical exam as documented. Fasting blood tests today. Flu vaccine today. 

## 2022-10-06 NOTE — Progress Notes (Signed)
Established Patient Office Visit  Subjective:  Patient ID: [name], female    DOB: 1945/07/03  Age: 77 y.o. MRN: [id number]  CC:  Chief Complaint  Patient presents with   Annual Exam    HPI [name] is a 77 y.o. female with past medical history of HTN, PSVT, GERD, hypothyroidism, HLD, mood disorder, OA and obesity who presents for annual physical.  Hypothyroidism: She has been taking levothyroxine 125 mcg QD regularly. She had been having excessive sweating for the last few months, which has improved since decreasing doses of levothyroxine and Cymbalta.  She reports chronic fatigue as well. Denies any recent change in appetite or weight.  Denies any tremors or palpitations currently.  Denies any fever, chills or LAD.   HTN: BP is well-controlled. Takes medications regularly. Patient denies headache, dizziness, chest pain, dyspnea or palpitations.   Type II DM: Her last HbA1c was 6.1 in 05/24.  She is on metformin and has been tolerating it well.  Denies any polyuria or polyphagia currently.  Bipolar disorder: She is currently on Lybalvi, Cymbalta and Wellbutrin.  She also takes Klonopin for anxiety.  Followed by psychiatry.  She has chronic fatigue, but denies insomnia, SI or HI currently.  Denies any recent manic episodes.   She reports memory concerns.  Has had episodes of confusion while driving in her neighborhood, and has almost got lost in her neighborhood.  She has had instances when she got ready for her doctors appointment on wrong days.  She has been taking donepezil.  She has been evaluated by neurology, but could not pursue further workup due to transportation issue to [place]. She is currently independent for ADLs.  She lives by herself.  She relies on her brother for transportation.    Past Medical History:  Diagnosis Date   Allergic rhinitis    Arthritis    B12 deficiency    Bipolar disorder (HCC)    Coronary artery disease    Depression     Diabetes mellitus (HCC)    Diabetes mellitus (HCC) 10/05/2021   Essential hypertension    GERD (gastroesophageal reflux disease)    Hashimoto's thyroiditis    History of transient ischemic attack (TIA)    Or possibly migraine as well as right amaurosis fugax and ataxia - Dr. Sandria Manly   Hyperlipidemia    Hypothyroidism    PSVT (paroxysmal supraventricular tachycardia)    Right bundle branch block     Past Surgical History:  Procedure Laterality Date   CATARACT EXTRACTION W/PHACO Left 05/23/2020   Procedure: CATARACT EXTRACTION PHACO AND INTRAOCULAR LENS PLACEMENT LEFT EYE;  Surgeon: Fabio Pierce, MD;  Location: [facility];  Service: Ophthalmology;  Laterality: Left;  left CDE=15.69   CATARACT EXTRACTION W/PHACO Right 06/13/2020   Procedure: CATARACT EXTRACTION PHACO AND INTRAOCULAR LENS PLACEMENT RIGHT EYE;  Surgeon: Fabio Pierce, MD;  Location: [facility];  Service: Ophthalmology;  Laterality: Right;  right CDE=11.33   COLONOSCOPY WITH PROPOFOL N/A 04/22/2022   Procedure: COLONOSCOPY WITH PROPOFOL;  Surgeon: Corbin Ade, MD;  Location: [facility];  Service: Endoscopy;  Laterality: N/A;  8:30 am, asa 3   LAPAROSCOPIC CHOLECYSTECTOMY  2010   POLYPECTOMY  04/22/2022   Procedure: POLYPECTOMY INTESTINAL;  Surgeon: Corbin Ade, MD;  Location: [facility];  Service: Endoscopy;;   THYROIDECTOMY  2009   TOTAL KNEE ARTHROPLASTY Left 02/16/2021   Procedure: TOTAL KNEE ARTHROPLASTY;  Surgeon: Ollen Gross, MD;  Location: [facility];  Service: Orthopedics;  Laterality: Left;    Family History  Problem Relation Age of Onset   Stroke Mother    Thyroid disease Mother    Depression Father    Hypertension Sister    Aneurysm Sister    Diabetes Brother    Heart attack Brother    Hypertension Brother    Heart failure Brother    Thyroid disease Brother    Stroke Maternal Grandmother    Heart failure Maternal Grandmother    Heart failure Maternal Grandfather    Stroke Maternal Grandfather     Heart failure Paternal Grandmother    Cancer - Colon Neg Hx     Social History   Socioeconomic History   Marital status: Widowed    Spouse name: Not on file   Number of children: 2   Years of education: 68   Highest education level: Not on file  Occupational History   Occupation: homemaker    Employer: RETIRED  Tobacco Use   Smoking status: Former    Current packs/day: 0.00    Average packs/day: 0.5 packs/day for 10.0 years (5.0 ttl pk-yrs)    Types: Cigarettes    Start date: 01/25/1973    Quit date: 01/26/1983    Years since quitting: 39.7   Smokeless tobacco: Never   Tobacco comments:    quit 30 years ago  Vaping Use   Vaping status: Never Used  Substance and Sexual Activity   Alcohol use: No    Alcohol/week: 0.0 standard drinks of alcohol   Drug use: No   Sexual activity: Not Currently    Birth control/protection: Post-menopausal  Other Topics Concern   Not on file  Social History Narrative   Regular exercise: walk when ableCaffeine use: occasionally   Right handed   Drinks caffeine prn-coffee daily   One floor home   Lives alone with two cats   retired   International aid/development worker of [employer] Strain: Low Risk  (02/13/2021)   Overall Financial Resource Strain (CARDIA)    Difficulty of Paying Living Expenses: Not hard at all  Food Insecurity: No Food Insecurity (02/13/2021)   Hunger Vital Sign    Worried About Running Out of Food in the Last Year: Never true    Ran Out of Food in the Last Year: Never true  Transportation Needs: No Transportation Needs (02/13/2021)   PRAPARE - Administrator, Civil Service (Medical): No    Lack of Transportation (Non-Medical): No  Physical Activity: Inactive (02/13/2021)   Exercise Vital Sign    Days of Exercise per Week: 0 days    Minutes of Exercise per Session: 0 min  Stress: No Stress Concern Present (02/13/2021)   [employer] of Occupational Health - Occupational Stress Questionnaire    Feeling  of Stress : Not at all  Social Connections: Unknown (06/01/2021)   Received from [facility]   Social Network    Social Network: Not on file  Intimate Partner Violence: Unknown (05/01/2021)   Received from [facility]   HITS    Physically Hurt: Not on file    Insult or Talk Down To: Not on file    Threaten Physical Harm: Not on file    Scream or Curse: Not on file    Outpatient Medications Prior to Visit  Medication Sig Dispense Refill   acetaminophen (TYLENOL) 500 MG tablet Take 1,000 mg by mouth every 6 (six) hours as needed for moderate pain or headache.  albuterol (VENTOLIN HFA) 108 (90 Base) MCG/ACT inhaler Inhale 2 puffs into the lungs every 6 (six) hours as needed for wheezing or shortness of breath. 18 g 0   alendronate (FOSAMAX) 70 MG tablet TAKE 1 TABLET EVERY WEEK IN THE MORNING 30 MIN BEFORE EATING WITH AN 8OZ GLASS OF WATER (SIT UP 30 MIN) 4 tablet 11   blood glucose meter kit and supplies KIT Dispense based on patient and insurance preference. Use up to four times daily as directed. 1 each 0   buPROPion (WELLBUTRIN XL) 300 MG 24 hr tablet Take 300 mg by mouth daily.      clonazePAM (KLONOPIN) 0.5 MG tablet Take 0.25-0.5 mg by mouth in the morning, at noon, and at bedtime.     diltiazem (CARDIZEM CD) 240 MG 24 hr capsule TAKE (1) CAPSULE BY MOUTH ONCE DAILY. 90 capsule 3   donepezil (ARICEPT) 5 MG tablet TAKE (1) TABLET BY MOUTH AT BEDTIME. 90 tablet 0   DULoxetine HCl 20 MG CSDR Take 20 mg by mouth 2 (two) times daily.     ferrous sulfate 325 (65 FE) MG tablet Take 325 mg by mouth daily with breakfast.     Lancets (ONETOUCH DELICA PLUS LANCET33G) MISC USE TO TEST BLOOD SUGAR TWICE DAILY. 100 each 0   levothyroxine (SYNTHROID) 125 MCG tablet Take 1 tablet (125 mcg total) by mouth daily before breakfast. 90 tablet 1   LYBALVI 5-10 MG TABS Take 1 tablet by mouth at bedtime.     Multiple Vitamins-Minerals (ICAPS AREDS 2 PO) Take 1 capsule by  mouth 2 (two) times daily.     Na Sulfate-K Sulfate-Mg Sulf 17.5-3.13-1.6 GM/177ML SOLN As directed 354 mL 0   ondansetron (ZOFRAN) 4 MG tablet Take 1 tablet (4 mg total) by mouth every 8 (eight) hours as needed for nausea or vomiting. 20 tablet 0   ONETOUCH ULTRA TEST test strip USE TO TEST BLOOD SUGAR TWICE DAILY. 50 strip 0   pantoprazole (PROTONIX) 40 MG tablet TAKE 1 TABLET 30 MINUTES BEFORE BREAKFAST. 30 tablet 0   atorvastatin (LIPITOR) 80 MG tablet TAKE 1 TABLET BY MOUTH ONCE A DAY (NIGHT). 30 tablet 0   DULoxetine (CYMBALTA) 60 MG capsule Take 60 mg by mouth 2 (two) times daily.      losartan (COZAAR) 50 MG tablet TAKE 1 TABLET BY MOUTH TWICE DAILY (MORNING AND NIGHT). 60 tablet 0   metFORMIN (GLUCOPHAGE) 500 MG tablet TAKE 1 TABLET BY MOUTHTWICE DAILY WITH A MEAL (MORNING AND NIGHT). 60 tablet 0   MYRBETRIQ 25 MG TB24 tablet Take 1 tablet (25 mg total) by mouth daily (MORNING). 30 tablet 0   No facility-administered medications prior to visit.    Allergies  Allergen Reactions   Amoxicillin-Pot Clavulanate Rash   Atenolol Rash   Penicillins Rash    Tolerated Cephalosporin Date: 02/17/21.     Sulfonamide Derivatives Rash    ROS Review of Systems  Constitutional:  Positive for fatigue. Negative for chills and fever.  HENT:  Negative for congestion, sinus pressure and sinus pain.   Eyes:  Negative for pain and discharge.  Respiratory:  Negative for cough and shortness of breath.   Cardiovascular:  Negative for chest pain and palpitations.  Gastrointestinal:  Negative for diarrhea, nausea and vomiting.  Genitourinary:  Negative for dysuria and hematuria.  Musculoskeletal:  Negative for neck pain and neck stiffness.  Skin:  Negative for rash.  Neurological:  Negative for dizziness and syncope.  Psychiatric/Behavioral:  Positive for confusion.  Negative for agitation and behavioral problems. The patient is nervous/anxious.       Objective:    Physical Exam Vitals  reviewed.  Constitutional:      General: She is not in acute distress.    Appearance: She is obese. She is not diaphoretic.  HENT:     Head: Normocephalic and atraumatic.     Nose: No congestion.     Mouth/Throat:     Mouth: Mucous membranes are moist.     Pharynx: No posterior oropharyngeal erythema.  Eyes:     General: No scleral icterus.    Extraocular Movements: Extraocular movements intact.  Cardiovascular:     Rate and Rhythm: Normal rate and regular rhythm.     Pulses: Normal pulses.     Heart sounds: Normal heart sounds. No murmur heard. Pulmonary:     Breath sounds: Normal breath sounds. No wheezing or rales.  Abdominal:     Palpations: Abdomen is soft.     Tenderness: There is no abdominal tenderness.  Musculoskeletal:     Cervical back: Neck supple. No tenderness.     Right lower leg: No edema.     Left lower leg: No edema.  Skin:    General: Skin is warm.     Findings: No rash.  Neurological:     General: No focal deficit present.     Mental Status: She is alert and oriented to person, place, and time.     Cranial Nerves: No cranial nerve deficit.     Sensory: No sensory deficit.     Motor: No weakness.  Psychiatric:        Mood and Affect: Mood normal.        Behavior: Behavior normal.     BP 124/66   Pulse 72   Ht 5' 2.5" (1.588 m)   Wt 183 lb 6.4 oz (83.2 kg)   SpO2 93%   BMI 33.01 kg/m  Wt Readings from Last 3 Encounters:  10/06/22 183 lb 6.4 oz (83.2 kg)  09/15/22 181 lb 6.4 oz (82.3 kg)  08/13/22 183 lb 1.6 oz (83.1 kg)    Lab Results  Component Value Date   TSH 0.607 09/09/2022   Lab Results  Component Value Date   WBC 10.5 04/21/2022   HGB 14.5 04/21/2022   HCT 41.2 04/21/2022   MCV 94.7 04/21/2022   PLT 376 04/21/2022   Lab Results  Component Value Date   NA 138 06/03/2022   K 4.8 06/03/2022   CO2 26 06/03/2022   GLUCOSE 91 06/03/2022   BUN 10 06/03/2022   CREATININE 0.66 06/03/2022   BILITOT 0.8 04/21/2022   ALKPHOS 79  04/21/2022   AST 33 04/21/2022   ALT 41 04/21/2022   PROT 7.7 04/21/2022   ALBUMIN 4.5 04/21/2022   CALCIUM 9.8 06/03/2022   ANIONGAP 11 04/21/2022   EGFR 91 06/03/2022   Lab Results  Component Value Date   CHOL 149 05/26/2021   Lab Results  Component Value Date   HDL 59 05/26/2021   Lab Results  Component Value Date   LDLCALC 73 05/26/2021   Lab Results  Component Value Date   TRIG 94 05/26/2021   Lab Results  Component Value Date   CHOLHDL 2.5 05/26/2021   Lab Results  Component Value Date   HGBA1C 6.1 (H) 06/03/2022      Assessment & Plan:   Problem List Items Addressed This Visit       Cardiovascular and Mediastinum  Essential hypertension    BP Readings from Last 1 Encounters:  10/06/22 124/66   Well-controlled with Losartan 50 mg BID and Diltiazem 240 mg QD Counseled for compliance with the medications Advised DASH diet and moderate exercise/walking as tolerated      Relevant Medications   atorvastatin (LIPITOR) 80 MG tablet   losartan (COZAAR) 50 MG tablet     Respiratory   OSA (obstructive sleep apnea)    Has snoring, daytime fatigue and an apneic episode STOP-BANG: 6 At high risk of moderate to severe OSA Ordered home sleep study in the past, but she prefers not to do it currently -she is initially preferred sleep study in sleep lab, but later canceled referral to sleep specialist Focus on weight loss by following low-carb diet        Endocrine   Postoperative hypothyroidism    Lab Results  Component Value Date   TSH 0.607 09/09/2022   On Levothyroxine 125 mcg QD recently, was adjusted after last check of TSH Followed by endocrinology      Diabetes mellitus (HCC)    Lab Results  Component Value Date   HGBA1C 6.1 (H) 06/03/2022   Associated with HTN and HLD Well-controlled On Metformin Advised to check her blood glucose when she has episodes of sweating and facial flushing-could be hypoglycemia Advised to follow diabetic  diet On ARB and statin F/u CMP and lipid panel Diabetic eye exam: Advised to follow up with Ophthalmology for diabetic eye exam      Relevant Medications   atorvastatin (LIPITOR) 80 MG tablet   losartan (COZAAR) 50 MG tablet   metFORMIN (GLUCOPHAGE) 500 MG tablet   Other Relevant Orders   CMP14+EGFR   Urine Microalbumin w/creat. ratio   Hemoglobin A1c     Nervous and Auditory   Dementia    MMSE: 24/30 Has had episodes of confusion, could be from multiple psychiatric medicines On donepezil 5 mg daily Referred to neurology, but could not pursue further workup due to transportation issue to [place].       Relevant Medications   DULoxetine HCl 20 MG CSDR     Other   Hyperlipidemia    On atorvastatin 80 mg QD      Relevant Medications   atorvastatin (LIPITOR) 80 MG tablet   losartan (COZAAR) 50 MG tablet   Urge incontinence of urine    Well-controlled with Myrbetriq      Relevant Medications   mirabegron ER (MYRBETRIQ) 25 MG TB24 tablet   Encounter for general adult medical examination with abnormal findings - Primary    Physical exam as documented. Fasting blood tests today. Flu vaccine today.       Other Visit Diagnoses     Encounter for immunization       Relevant Orders   Flu Vaccine Trivalent High Dose (Fluad) (Completed)       Meds ordered this encounter  Medications   atorvastatin (LIPITOR) 80 MG tablet    Sig: Take 1 tablet (80 mg total) by mouth daily.    Dispense:  90 tablet    Refill:  3   losartan (COZAAR) 50 MG tablet    Sig: Take 1 tablet (50 mg total) by mouth 2 (two) times daily.    Dispense:  60 tablet    Refill:  5   metFORMIN (GLUCOPHAGE) 500 MG tablet    Sig: Take 1 tablet (500 mg total) by mouth 2 (two) times daily with a meal.    Dispense:  60  tablet    Refill:  5   mirabegron ER (MYRBETRIQ) 25 MG TB24 tablet    Sig: Take 1 tablet (25 mg total) by mouth daily.    Dispense:  30 tablet    Refill:  5    Follow-up: Return in  about 4 months (around 02/05/2023) for Hypothyroidism and HLD.    Anabel Halon, MD

## 2022-10-06 NOTE — Assessment & Plan Note (Addendum)
MMSE: 24/30 Has had episodes of confusion, could be from multiple psychiatric medicines On donepezil 5 mg daily Referred to neurology, but could not pursue further workup due to transportation issue to [place].

## 2022-10-06 NOTE — Assessment & Plan Note (Addendum)
Has snoring, daytime fatigue and an apneic episode STOP-BANG: 6 At high risk of moderate to severe OSA Ordered home sleep study in the past, but she prefers not to do it currently -she is initially preferred sleep study in sleep lab, but later canceled referral to sleep specialist Focus on weight loss by following low-carb diet

## 2022-10-06 NOTE — Assessment & Plan Note (Addendum)
Lab Results  Component Value Date   TSH 0.607 09/09/2022   On Levothyroxine 125 mcg QD recently, was adjusted after last check of TSH Followed by endocrinology

## 2022-10-08 LAB — HEMOGLOBIN A1C
Est. average glucose Bld gHb Est-mCnc: 137 mg/dL
Hgb A1c MFr Bld: 6.4 % — ABNORMAL HIGH (ref 4.8–5.6)

## 2022-10-08 LAB — CMP14+EGFR
ALT: 37 IU/L — ABNORMAL HIGH (ref 0–32)
AST: 21 IU/L (ref 0–40)
Albumin: 4.5 g/dL (ref 3.8–4.8)
Alkaline Phosphatase: 80 IU/L (ref 44–121)
BUN/Creatinine Ratio: 14 (ref 12–28)
BUN: 12 mg/dL (ref 8–27)
Bilirubin Total: 0.3 mg/dL (ref 0.0–1.2)
CO2: 25 mmol/L (ref 20–29)
Calcium: 10.1 mg/dL (ref 8.7–10.3)
Chloride: 95 mmol/L — ABNORMAL LOW (ref 96–106)
Creatinine, Ser: 0.86 mg/dL (ref 0.57–1.00)
Globulin, Total: 2.3 g/dL (ref 1.5–4.5)
Glucose: 141 mg/dL — ABNORMAL HIGH (ref 70–99)
Potassium: 4.1 mmol/L (ref 3.5–5.2)
Sodium: 137 mmol/L (ref 134–144)
Total Protein: 6.8 g/dL (ref 6.0–8.5)
eGFR: 70 mL/min/{1.73_m2} (ref >59)

## 2022-10-08 LAB — MICROALBUMIN / CREATININE URINE RATIO
Creatinine, Urine: 253.4 mg/dL
Microalb/Creat Ratio: 12 mg/g{creat} (ref 0–29)
Microalbumin, Urine: 29.3 ug/mL

## 2022-10-14 DIAGNOSIS — M25561 Pain in right knee: Secondary | ICD-10-CM | POA: Diagnosis not present

## 2022-10-25 ENCOUNTER — Other Ambulatory Visit: Payer: Self-pay | Admitting: Internal Medicine

## 2022-11-15 ENCOUNTER — Other Ambulatory Visit: Payer: Self-pay | Admitting: Internal Medicine

## 2022-11-16 DIAGNOSIS — F3181 Bipolar II disorder: Secondary | ICD-10-CM | POA: Diagnosis not present

## 2022-11-16 DIAGNOSIS — F429 Obsessive-compulsive disorder, unspecified: Secondary | ICD-10-CM | POA: Diagnosis not present

## 2022-12-02 ENCOUNTER — Ambulatory Visit: Payer: Self-pay | Admitting: Internal Medicine

## 2022-12-02 NOTE — Telephone Encounter (Signed)
  Chief Complaint: Urgency, Incontinence Symptoms: Urgency, Incontinence Frequency: Constant Pertinent Negatives: Patient denies any associated symptoms. Disposition: [] ED /[x] Urgent Care (no appt availability in office) / [] Appointment(In office/virtual)/ []  [place] [facility]/ [] Home Care/ [] Refused Recommended Disposition /[] [facility]/ []  Follow-up with PCP Additional Notes: Scheduled UC Appointment.  Reason for Disposition  [1] Can't control passage of urine (i.e., urinary incontinence) AND [2] new-onset (< 2 weeks) or worsening  Answer Assessment - Initial Assessment Questions 1. SYMPTOM: "What's the main symptom you're concerned about?" (e.g., frequency, incontinence)     Incontinence, Urgency 2. ONSET: "When did the  Incontinence  start?"     One week 3. PAIN: "Is there any pain?" If Yes, ask: "How bad is it?" (Scale: 1-10; mild, moderate, severe)     No 4. CAUSE: "What do you think is causing the symptoms?"     Unsure 5. OTHER SYMPTOMS: "Do you have any other symptoms?" (e.g., blood in urine, fever, flank pain, pain with urination)     No associated symptoms. 6. PREGNANCY: "Is there any chance you are pregnant?" "When was your last menstrual period?"     No  Protocols used: Urinary Symptoms-A-AH

## 2022-12-03 ENCOUNTER — Ambulatory Visit
Admission: EM | Admit: 2022-12-03 | Discharge: 2022-12-03 | Disposition: A | Discharge status: Home / Self Care | Payer: PPO | Attending: Nurse Practitioner | Admitting: Nurse Practitioner

## 2022-12-03 ENCOUNTER — Encounter: Payer: Self-pay | Admitting: Emergency Medicine

## 2022-12-03 ENCOUNTER — Other Ambulatory Visit: Payer: Self-pay

## 2022-12-03 ENCOUNTER — Ambulatory Visit: Payer: Self-pay

## 2022-12-03 DIAGNOSIS — R350 Frequency of micturition: Secondary | ICD-10-CM

## 2022-12-03 DIAGNOSIS — N898 Other specified noninflammatory disorders of vagina: Secondary | ICD-10-CM | POA: Diagnosis not present

## 2022-12-03 LAB — POCT URINALYSIS DIP (MANUAL ENTRY)
Bilirubin, UA: NEGATIVE
Blood, UA: NEGATIVE
Glucose, UA: NEGATIVE mg/dL
Ketones, POC UA: NEGATIVE mg/dL
Leukocytes, UA: NEGATIVE
Nitrite, UA: NEGATIVE
Protein Ur, POC: NEGATIVE mg/dL
Spec Grav, UA: 1.02 (ref 1.010–1.025)
Urobilinogen, UA: 0.2 U/dL
pH, UA: 6 (ref 5.0–8.0)

## 2022-12-03 MED ORDER — FLUCONAZOLE 150 MG PO TABS
150.0000 mg | ORAL_TABLET | Freq: Once | ORAL | 0 refills | Status: AC
Start: 2022-12-03 — End: 2022-12-03

## 2022-12-03 NOTE — Discharge Instructions (Signed)
The urine sample today does not show any signs of urinary infection.  As we discussed, I think you may have a yeast infection.  Take the fluconazole as prescribed to treat it.  If symptoms do not improve with this treatment, recommend close follow-up with your primary care provider early next week.  If your symptoms worsen over the weekend, please seek emergent care.

## 2022-12-03 NOTE — ED Provider Notes (Signed)
[facility]    CSN: [id number] Arrival date & time: 12/03/22  1213      History   Chief Complaint Chief Complaint  Patient presents with   Urinary Frequency    HPI [name] is a 77 y.o. female.   Patient presents today with 1 week history of urinary frequency and vaginal/urinary "itching".  She denies burning with urination, urinary urgency, foul urinary odor, or hematuria.  No abdominal pain, fever, nausea/vomiting, or diarrhea.  No change in appetite or change in behavior.  She denies recent change in medications.  She describes herself as being "a bit scattered" at baseline and she feels like she may be more scattered lately.  She does see a psychiatrist in [place] and reports she recently saw them without any medication changes.    Past Medical History:  Diagnosis Date   Allergic rhinitis    Arthritis    B12 deficiency    Bipolar disorder (HCC)    Coronary artery disease    Depression    Diabetes mellitus (HCC)    Diabetes mellitus (HCC) 10/05/2021   Essential hypertension    GERD (gastroesophageal reflux disease)    Hashimoto's thyroiditis    History of transient ischemic attack (TIA)    Or possibly migraine as well as right amaurosis fugax and ataxia - Dr. Sandria Manly   Hyperlipidemia    Hypothyroidism    PSVT (paroxysmal supraventricular tachycardia) (HCC)    Right bundle branch block     Patient Active Problem List   Diagnosis Date Noted   OSA (obstructive sleep apnea) 07/07/2022   Typical atrial flutter (HCC) 06/03/2022   Dementia 06/03/2022   Positive colorectal cancer screening using Cologuard test 03/17/2022   Excessive sweating 02/02/2022   Age-related osteoporosis without current pathological fracture 02/01/2022   Acute bronchitis 01/22/2022   Macular degeneration 10/12/2021   Diabetes mellitus (HCC) 10/05/2021   Encounter for general adult medical examination with abnormal findings 10/05/2021   Bilateral impacted cerumen  10/05/2021   Onychoschizia 07/01/2021   Alopecia 05/26/2021   Hot flashes 05/26/2021   Menopause 05/26/2021   Morbid obesity (HCC) 05/26/2021   Allergic sinusitis 05/26/2021   Primary osteoarthritis of left knee 02/16/2021   Cognitive decline 01/14/2021   Breast pain, left 09/30/2020   Urge incontinence of urine 08/19/2020   Constipation 06/09/2020   Hearing loss 06/09/2020   Irritable bowel syndrome 06/09/2020   Vitamin D deficiency 06/09/2020   B12 deficiency anemia 04/07/2020   Bipolar disorder (HCC) 04/07/2020   Anxiety 04/07/2020   Gastroesophageal reflux disease 04/07/2020   Iron deficiency anemia 04/07/2020   Osteoarthritis of knee 09/12/2017   Postoperative hypothyroidism 12/08/2012   PSVT (paroxysmal supraventricular tachycardia) (HCC) 03/12/2011   Exertional dyspnea 02/05/2011   Hyperlipidemia 11/06/2009   Essential hypertension 11/06/2009    Past Surgical History:  Procedure Laterality Date   CATARACT EXTRACTION W/PHACO Left 05/23/2020   Procedure: CATARACT EXTRACTION PHACO AND INTRAOCULAR LENS PLACEMENT LEFT EYE;  Surgeon: Fabio Pierce, MD;  Location: [facility];  Service: Ophthalmology;  Laterality: Left;  left CDE=15.69   CATARACT EXTRACTION W/PHACO Right 06/13/2020   Procedure: CATARACT EXTRACTION PHACO AND INTRAOCULAR LENS PLACEMENT RIGHT EYE;  Surgeon: Fabio Pierce, MD;  Location: [facility];  Service: Ophthalmology;  Laterality: Right;  right CDE=11.33   COLONOSCOPY WITH PROPOFOL N/A 04/22/2022   Procedure: COLONOSCOPY WITH PROPOFOL;  Surgeon: Corbin Ade, MD;  Location: [facility];  Service: Endoscopy;  Laterality: N/A;  8:30 am, asa 3  LAPAROSCOPIC CHOLECYSTECTOMY  2010   POLYPECTOMY  04/22/2022   Procedure: POLYPECTOMY INTESTINAL;  Surgeon: Corbin Ade, MD;  Location: [facility];  Service: Endoscopy;;   THYROIDECTOMY  2009   TOTAL KNEE ARTHROPLASTY Left 02/16/2021   Procedure: TOTAL KNEE ARTHROPLASTY;  Surgeon: Ollen Gross, MD;  Location: [facility];  Service: Orthopedics;  Laterality: Left;    OB History   No obstetric history on file.      Home Medications    Prior to Admission medications   Medication Sig Start Date End Date Taking? Authorizing Provider  fluconazole (DIFLUCAN) 150 MG tablet Take 1 tablet (150 mg total) by mouth once for 1 dose. 12/03/22 12/03/22 Yes Valentino Nose, NP  acetaminophen (TYLENOL) 500 MG tablet Take 1,000 mg by mouth every 6 (six) hours as needed for moderate pain or headache.    [provider]  albuterol (VENTOLIN HFA) 108 (90 Base) MCG/ACT inhaler Inhale 2 puffs into the lungs every 6 (six) hours as needed for wheezing or shortness of breath. 09/02/20   Heather Roberts, NP  alendronate (FOSAMAX) 70 MG tablet TAKE 1 TABLET EVERY WEEK IN THE MORNING 30 MIN BEFORE EATING WITH AN 8OZ GLASS OF WATER (SIT UP 30 MIN) 02/01/22   Anabel Halon, MD  atorvastatin (LIPITOR) 80 MG tablet Take 1 tablet (80 mg total) by mouth daily. 10/06/22   Anabel Halon, MD  blood glucose meter kit and supplies KIT Dispense based on patient and insurance preference. Use up to four times daily as directed. 02/01/22   Anabel Halon, MD  buPROPion (WELLBUTRIN XL) 300 MG 24 hr tablet Take 300 mg by mouth daily.  02/08/14   [provider]  clonazePAM (KLONOPIN) 0.5 MG tablet Take 0.25-0.5 mg by mouth in the morning, at noon, and at bedtime.      diltiazem (CARDIZEM CD) 240 MG 24 hr capsule TAKE (1) CAPSULE BY MOUTH ONCE DAILY. 08/26/22   Jonelle Sidle, MD  donepezil (ARICEPT) 5 MG tablet TAKE (1) TABLET BY MOUTH AT BEDTIME. 08/09/22   Anabel Halon, MD  DULoxetine HCl 20 MG CSDR Take 20 mg by mouth 2 (two) times daily.    [provider]  ferrous sulfate 325 (65 FE) MG tablet Take 325 mg by mouth daily with breakfast.    [provider]  Lancets (ONETOUCH DELICA PLUS LANCET33G) MISC USE TO TEST BLOOD SUGAR TWICE DAILY. 07/31/21   Anabel Halon, MD  levothyroxine (SYNTHROID) 125 MCG tablet  Take 1 tablet (125 mcg total) by mouth daily before breakfast. 09/15/22   Dani Gobble, NP  losartan (COZAAR) 50 MG tablet Take 1 tablet (50 mg total) by mouth 2 (two) times daily. 10/06/22   Patel, Earlie Lou, MD  LYBALVI 5-10 MG TABS Take 1 tablet by mouth at bedtime. 03/10/20   [provider]  metFORMIN (GLUCOPHAGE) 500 MG tablet Take 1 tablet (500 mg total) by mouth 2 (two) times daily with a meal. 10/06/22   Anabel Halon, MD  mirabegron ER (MYRBETRIQ) 25 MG TB24 tablet Take 1 tablet (25 mg total) by mouth daily. 10/06/22   Anabel Halon, MD  Multiple Vitamins-Minerals (ICAPS AREDS 2 PO) Take 1 capsule by mouth 2 (two) times daily.    [provider]  Na Sulfate-K Sulfate-Mg Sulf 17.5-3.13-1.6 GM/177ML SOLN As directed 03/17/22   Rourk, Gerrit Friends, MD  ondansetron (ZOFRAN) 4 MG tablet Take 1 tablet (4 mg total) by mouth every 8 (  eight) hours as needed for nausea or vomiting. 06/11/21   Anabel Halon, MD  [facility] ULTRA TEST test strip USE TO TEST BLOOD SUGAR TWICE DAILY. 10/25/22   Anabel Halon, MD  pantoprazole (PROTONIX) 40 MG tablet TAKE 1 TABLET DAILY (LUNCH). 11/15/22   Anabel Halon, MD    Family History Family History  Problem Relation Age of Onset   Stroke Mother    Thyroid disease Mother    Depression Father    Hypertension Sister    Aneurysm Sister    Diabetes Brother    Heart attack Brother    Hypertension Brother    Heart failure Brother    Thyroid disease Brother    Stroke Maternal Grandmother    Heart failure Maternal Grandmother    Heart failure Maternal Grandfather    Stroke Maternal Grandfather    Heart failure Paternal Grandmother    Cancer - Colon Neg Hx     Social History Social History   Tobacco Use   Smoking status: Former    Current packs/day: 0.00    Average packs/day: 0.5 packs/day for 10.0 years (5.0 ttl pk-yrs)    Types: Cigarettes    Start date: 01/25/1973    Quit date: 01/26/1983    Years since quitting: 39.8    Smokeless tobacco: Never   Tobacco comments:    quit 30 years ago  Vaping Use   Vaping status: Never Used  Substance Use Topics   Alcohol use: No    Alcohol/week: 0.0 standard drinks of alcohol   Drug use: No     Allergies   Amoxicillin-pot clavulanate, Atenolol, Penicillins, and Sulfonamide derivatives   Review of Systems Review of Systems Per HPI  Physical Exam Triage Vital Signs ED Triage Vitals  Encounter Vitals Group     BP 12/03/22 1322 139/79     Systolic BP Percentile --      Diastolic BP Percentile --      Pulse Rate 12/03/22 1322 64     Resp 12/03/22 1322 20     Temp 12/03/22 1322 98.2 F (36.8 C)     Temp Source 12/03/22 1322 Oral     SpO2 12/03/22 1322 94 %     Weight --      Height --      Head Circumference --      Peak Flow --      Pain Score 12/03/22 1319 3     Pain Loc --      Pain Education --      Exclude from Growth Chart --    No data found.  Updated Vital Signs BP 139/79 (BP Location: Right Arm)   Pulse 64   Temp 98.2 F (36.8 C) (Oral)   Resp 20   SpO2 94%   Visual Acuity Right Eye Distance:   Left Eye Distance:   Bilateral Distance:    Right Eye Near:   Left Eye Near:    Bilateral Near:     Physical Exam Vitals and nursing note reviewed.  Constitutional:      General: She is not in acute distress.    Appearance: She is not toxic-appearing.  Abdominal:     General: Abdomen is flat. Bowel sounds are normal. There is no distension.     Palpations: Abdomen is soft. There is no mass.     Tenderness: There is no abdominal tenderness. There is no right CVA tenderness, left CVA tenderness or guarding.  Skin:    General:  Skin is warm and dry.     Coloration: Skin is not jaundiced or pale.     Findings: No erythema.  Neurological:     Mental Status: She is alert.     Cranial Nerves: Cranial nerves 2-12 are intact.     Sensory: Sensation is intact.     Motor: No weakness or tremor.     Coordination: Coordination is intact.  Heel to [facility] Test normal. Rapid alternating movements normal.     Gait: Gait is intact. Gait normal.  Psychiatric:        Behavior: Behavior is cooperative.      UC Treatments / Results  Labs (all labs ordered are listed, but only abnormal results are displayed) Labs Reviewed  POCT URINALYSIS DIP (MANUAL ENTRY)    EKG   Radiology No results found.  Procedures Procedures (including critical care time)  Medications Ordered in UC Medications - No data to display  Initial Impression / Assessment and Plan / UC Course  I have reviewed the triage vital signs and the nursing notes.  Pertinent labs & imaging results that were available during my care of the patient were reviewed by me and considered in my medical decision making (see chart for details).   Patient is well-appearing, normotensive, afebrile, not tachycardic, not tachypneic, oxygenating well on room air.    1. Urinary frequency 2. Vaginal itching Urinalysis unremarkable Low suspicion for UTI, instead suspect possible yeast vaginitis Treat with fluconazole 150 mg once Supportive care discussed Recommended close follow up with PCP if symptoms not improved early next week Strict ER precautions discussed if symptoms worsen in the meantime  The patient was given the opportunity to ask questions.  All questions answered to their satisfaction.  The patient is in agreement to this plan.    Final Clinical Impressions(s) / UC Diagnoses   Final diagnoses:  Urinary frequency  Vaginal itching     Discharge Instructions      The urine sample today does not show any signs of urinary infection.  As we discussed, I think you may have a yeast infection.  Take the fluconazole as prescribed to treat it.  If symptoms do not improve with this treatment, recommend close follow-up with your primary care provider early next week.  If your symptoms worsen over the weekend, please seek emergent care.    ED Prescriptions      Medication Sig Dispense Auth. Provider   fluconazole (DIFLUCAN) 150 MG tablet Take 1 tablet (150 mg total) by mouth once for 1 dose. 1 tablet Valentino Nose, NP      PDMP not reviewed this encounter.   Valentino Nose, NP 12/03/22 1451

## 2022-12-03 NOTE — ED Triage Notes (Addendum)
Pt reports urinary frequency, decreased output, lower back pain for "over a week". Denies any known fevers, abd pain, nausea.   Reports "my mind has been all over the place for last several days."

## 2022-12-06 NOTE — Telephone Encounter (Signed)
Per daughter urine clear no appointment needed

## 2022-12-07 ENCOUNTER — Other Ambulatory Visit: Payer: Self-pay | Admitting: Internal Medicine

## 2022-12-07 DIAGNOSIS — G300 Alzheimer's disease with early onset: Secondary | ICD-10-CM

## 2022-12-13 DIAGNOSIS — E038 Other specified hypothyroidism: Secondary | ICD-10-CM | POA: Diagnosis not present

## 2022-12-13 DIAGNOSIS — E890 Postprocedural hypothyroidism: Secondary | ICD-10-CM | POA: Diagnosis not present

## 2022-12-14 LAB — T4, FREE: Free T4: 1.5 ng/dL (ref 0.82–1.77)

## 2022-12-14 LAB — TSH: TSH: 7.04 u[IU]/mL — ABNORMAL HIGH (ref 0.450–4.500)

## 2022-12-16 ENCOUNTER — Ambulatory Visit: Payer: PPO | Admitting: Nurse Practitioner

## 2022-12-16 DIAGNOSIS — E890 Postprocedural hypothyroidism: Secondary | ICD-10-CM

## 2022-12-20 ENCOUNTER — Other Ambulatory Visit: Payer: Self-pay | Admitting: Internal Medicine

## 2022-12-27 ENCOUNTER — Encounter: Payer: Self-pay | Admitting: Nurse Practitioner

## 2022-12-27 ENCOUNTER — Ambulatory Visit: Payer: PPO | Admitting: Nurse Practitioner

## 2022-12-27 VITALS — BP 115/74 | HR 67 | Ht 62.5 in | Wt 192.6 lb

## 2022-12-27 DIAGNOSIS — E038 Other specified hypothyroidism: Secondary | ICD-10-CM

## 2022-12-27 DIAGNOSIS — E890 Postprocedural hypothyroidism: Secondary | ICD-10-CM | POA: Diagnosis not present

## 2022-12-27 MED ORDER — LEVOTHYROXINE SODIUM 125 MCG PO TABS: 125.0000 ug | ORAL_TABLET | Freq: Every day | ORAL | 1 refills | Status: DC

## 2022-12-27 NOTE — Patient Instructions (Signed)

## 2022-12-27 NOTE — Progress Notes (Signed)
Endocrinology Follow Up Note                                         12/27/2022, 3:08 PM  Subjective:   Subjective    [name] is a 77 y.o.-year-old female patient being seen in follow up after being seen in consultation for hypothyroidism referred by Anabel Halon, MD.     Past Medical History:  Diagnosis Date   Allergic rhinitis    Arthritis    B12 deficiency    Bipolar disorder (HCC)    Coronary artery disease    Depression    Diabetes mellitus (HCC)    Diabetes mellitus (HCC) 10/05/2021   Essential hypertension    GERD (gastroesophageal reflux disease)    Hashimoto's thyroiditis    History of transient ischemic attack (TIA)    Or possibly migraine as well as right amaurosis fugax and ataxia - Dr. Sandria Manly   Hyperlipidemia    Hypothyroidism    PSVT (paroxysmal supraventricular tachycardia) (HCC)    Right bundle branch block     Past Surgical History:  Procedure Laterality Date   CATARACT EXTRACTION W/PHACO Left 05/23/2020   Procedure: CATARACT EXTRACTION PHACO AND INTRAOCULAR LENS PLACEMENT LEFT EYE;  Surgeon: Fabio Pierce, MD;  Location: [facility];  Service: Ophthalmology;  Laterality: Left;  left CDE=15.69   CATARACT EXTRACTION W/PHACO Right 06/13/2020   Procedure: CATARACT EXTRACTION PHACO AND INTRAOCULAR LENS PLACEMENT RIGHT EYE;  Surgeon: Fabio Pierce, MD;  Location: [facility];  Service: Ophthalmology;  Laterality: Right;  right CDE=11.33   COLONOSCOPY WITH PROPOFOL N/A 04/22/2022   Procedure: COLONOSCOPY WITH PROPOFOL;  Surgeon: Corbin Ade, MD;  Location: [facility];  Service: Endoscopy;  Laterality: N/A;  8:30 am, asa 3   LAPAROSCOPIC CHOLECYSTECTOMY  2010   POLYPECTOMY  04/22/2022   Procedure: POLYPECTOMY INTESTINAL;  Surgeon: Corbin Ade, MD;  Location: [facility];  Service: Endoscopy;;   THYROIDECTOMY  2009   TOTAL KNEE ARTHROPLASTY Left 02/16/2021   Procedure: TOTAL KNEE  ARTHROPLASTY;  Surgeon: Ollen Gross, MD;  Location: [facility];  Service: Orthopedics;  Laterality: Left;    Social History   Socioeconomic History   Marital status: Widowed    Spouse name: Not on file   Number of children: 2   Years of education: 60   Highest education level: Not on file  Occupational History   Occupation: homemaker    Employer: RETIRED  Tobacco Use   Smoking status: Former    Current packs/day: 0.00    Average packs/day: 0.5 packs/day for 10.0 years (5.0 ttl pk-yrs)    Types: Cigarettes    Start date: 01/25/1973    Quit date: 01/26/1983    Years since quitting: 39.9   Smokeless tobacco: Never   Tobacco comments:    quit 30 years ago  Vaping Use   Vaping status: Never Used  Substance and Sexual Activity   Alcohol use: No    Alcohol/week: 0.0 standard drinks of alcohol  Drug use: No   Sexual activity: Not Currently    Birth control/protection: Post-menopausal  Other Topics Concern   Not on file  Social History Narrative   Regular exercise: walk when ableCaffeine use: occasionally   Right handed   Drinks caffeine prn-coffee daily   One floor home   Lives alone with two cats   retired   Chief Executive Officer Determinants of Corporate investment banker Strain: Low Risk  (02/13/2021)   Overall Financial Resource Strain (CARDIA)    Difficulty of Paying Living Expenses: Not hard at all  Food Insecurity: No Food Insecurity (02/13/2021)   Hunger Vital Sign    Worried About Running Out of Food in the Last Year: Never true    Ran Out of Food in the Last Year: Never true  Transportation Needs: No Transportation Needs (02/13/2021)   PRAPARE - Administrator, Civil Service (Medical): No    Lack of Transportation (Non-Medical): No  Physical Activity: Inactive (02/13/2021)   Exercise Vital Sign    Days of Exercise per Week: 0 days    Minutes of Exercise per Session: 0 min  Stress: No Stress Concern Present (02/13/2021)   [employer] of Occupational Health -  Occupational Stress Questionnaire    Feeling of Stress : Not at all  Social Connections: Unknown (06/01/2021)   Received from [facility]   Social Network    Social Network: Not on file    Family History  Problem Relation Age of Onset   Stroke Mother    Thyroid disease Mother    Depression Father    Hypertension Sister    Aneurysm Sister    Diabetes Brother    Heart attack Brother    Hypertension Brother    Heart failure Brother    Thyroid disease Brother    Stroke Maternal Grandmother    Heart failure Maternal Grandmother    Heart failure Maternal Grandfather    Stroke Maternal Grandfather    Heart failure Paternal Grandmother    Cancer - Colon Neg Hx     Outpatient Encounter Medications as of 12/27/2022  Medication Sig   acetaminophen (TYLENOL) 500 MG tablet Take 1,000 mg by mouth every 6 (six) hours as needed for moderate pain or headache.   albuterol (VENTOLIN HFA) 108 (90 Base) MCG/ACT inhaler Inhale 2 puffs into the lungs every 6 (six) hours as needed for wheezing or shortness of breath.   alendronate (FOSAMAX) 70 MG tablet TAKE 1 TABLET EVERY WEEK IN THE MORNING 30 MIN BEFORE EATING WITH AN 8OZ GLASS OF WATER (SIT UP 30 MIN)   atorvastatin (LIPITOR) 80 MG tablet Take 1 tablet (80 mg total) by mouth daily.   blood glucose meter kit and supplies KIT Dispense based on patient and insurance preference. Use up to four times daily as directed.   buPROPion (WELLBUTRIN XL) 300 MG 24 hr tablet Take 300 mg by mouth daily.    clonazePAM (KLONOPIN) 0.5 MG tablet Take 0.25-0.5 mg by mouth in the morning, at noon, and at bedtime.   diltiazem (CARDIZEM CD) 240 MG 24 hr capsule TAKE (1) CAPSULE BY MOUTH ONCE DAILY.   donepezil (ARICEPT) 5 MG tablet TAKE (1) TABLET BY MOUTH AT BEDTIME.   DULoxetine HCl 20 MG CSDR Take 20 mg by mouth 2 (two) times daily.   ferrous sulfate 325 (65 FE) MG tablet Take 325 mg by mouth daily with breakfast.   Lancets (ONETOUCH DELICA PLUS  LANCET33G) MISC USE TO TEST BLOOD  SUGAR TWICE DAILY.   losartan (COZAAR) 50 MG tablet Take 1 tablet (50 mg total) by mouth 2 (two) times daily.   LYBALVI 5-10 MG TABS Take 1 tablet by mouth at bedtime.   metFORMIN (GLUCOPHAGE) 500 MG tablet Take 1 tablet (500 mg total) by mouth 2 (two) times daily with a meal.   mirabegron ER (MYRBETRIQ) 25 MG TB24 tablet Take 1 tablet (25 mg total) by mouth daily.   Multiple Vitamins-Minerals (ICAPS AREDS 2 PO) Take 1 capsule by mouth 2 (two) times daily.   Na Sulfate-K Sulfate-Mg Sulf 17.5-3.13-1.6 GM/177ML SOLN As directed   ondansetron (ZOFRAN) 4 MG tablet Take 1 tablet (4 mg total) by mouth every 8 (eight) hours as needed for nausea or vomiting.   ONETOUCH ULTRA TEST test strip USE TO TEST BLOOD SUGAR TWICE DAILY.   pantoprazole (PROTONIX) 40 MG tablet TAKE 1 TABLET DAILY (LUNCH).   [DISCONTINUED] levothyroxine (SYNTHROID) 125 MCG tablet Take 1 tablet (125 mcg total) by mouth daily before breakfast.   levothyroxine (SYNTHROID) 125 MCG tablet Take 1 tablet (125 mcg total) by mouth daily before breakfast.   No facility-administered encounter medications on file as of 12/27/2022.    ALLERGIES: Allergies  Allergen Reactions   Amoxicillin-Pot Clavulanate Rash   Atenolol Rash   Penicillins Rash    Tolerated Cephalosporin Date: 02/17/21.     Sulfonamide Derivatives Rash   VACCINATION STATUS: Immunization History  Administered Date(s) Administered   Fluad Quad(high Dose 65+) 10/30/2020, 10/05/2021   Fluad Trivalent(High Dose 65+) 10/06/2022   Influenza,inj,quad, With Preservative 10/26/2018   Moderna Sars-Covid-2 Vaccination 02/26/2019, 03/19/2019   Pneumococcal Conjugate-13 04/21/2020   Pneumococcal Polysaccharide-23 05/26/2021   Tdap 06/10/2021   Zoster Recombinant(Shingrix) 06/10/2021, 10/07/2021     HPI   [name]  is a patient with the above medical history. she was diagnosed with hypothyroidism at approximate age of 31 years  (after total thyroidectomy- done after abnormal biopsy-determined benign), which required subsequent initiation of thyroid hormone replacement therapy. she was given various doses of Levothyroxine over the years, currently prescribed 137 micrograms.    She does have memory impairment and lives alone.  She states her son lives nearby and daughter lives in [place] and that they were discussing whether or not to hire some help for her in the house 3 days a week.  She does not have any routine schedule with her day.  She receives the majority of her medication in pill packs.  I reviewed patient's thyroid tests:  Lab Results  Component Value Date   TSH 7.040 (H) 12/13/2022   TSH 0.607 09/09/2022   TSH 10.300 (H) 06/03/2022   TSH 8.640 (H) 03/30/2022   TSH 17.800 (H) 02/01/2022   TSH 1.390 10/05/2021   TSH 0.333 (L) 07/01/2021   TSH 0.716 05/26/2021   TSH 1.100 01/14/2021   TSH 6.640 (H) 10/30/2020   FREET4 1.50 12/13/2022   FREET4 2.07 (H) 09/09/2022   FREET4 1.10 06/03/2022   FREET4 1.45 03/30/2022   FREET4 1.17 02/01/2022   FREET4 1.59 10/05/2021   FREET4 2.25 (H) 07/01/2021   FREET4 1.88 (H) 05/26/2021   FREET4 1.63 01/14/2021   FREET4 1.30 10/30/2020    Pt denies feeling nodules in neck, hoarseness, dysphagia/odynophagia, SOB with lying down.  She did have thyroid ultrasound 1 year post total thyroidectomy and no residual tissue was noted.  I reviewed her chart and she also has a history of OSA, A Flutter, GERD, IBS, dementia, osteoporosis, OA, Bipolar disorder, iron  deficiency anemia, HLD and low vitamin D.   ROS:  Constitutional: no weight gain/loss, + fatigue-improved somewhat, no subjective hyperthermia, no subjective hypothermia, + memory impairment (sees neurology for dementia) Eyes: no blurry vision, no xerophthalmia ENT: no sore throat, no nodules palpated in throat, no dysphagia/odynophagia, no hoarseness Cardiovascular: no chest pain, no SOB, no leg  swelling Respiratory: no cough, no SOB Gastrointestinal: no nausea/vomiting/diarrhea, + intermittent constipation Musculoskeletal: no muscle/joint aches Skin: no rashes, + excessive sweating-somewhat improved Neurological: no tremors, no numbness, no tingling, no dizziness Psychiatric: no depression, no anxiety (hx Bipolar disorder- sees psych)   Objective:   Objective     BP 115/74 (BP Location: Left Arm, Patient Position: Sitting, Cuff Size: Large)   Pulse 67   Ht 5' 2.5" (1.588 m)   Wt 192 lb 9.6 oz (87.4 kg)   BMI 34.67 kg/m  Wt Readings from Last 3 Encounters:  12/27/22 192 lb 9.6 oz (87.4 kg)  10/06/22 183 lb 6.4 oz (83.2 kg)  09/15/22 181 lb 6.4 oz (82.3 kg)    BP Readings from Last 3 Encounters:  12/27/22 115/74  12/03/22 139/79  10/06/22 124/66      Physical Exam- Limited  Constitutional:  Body mass index is 34.67 kg/m. , not in acute distress, normal state of mind Eyes:  EOMI, no exophthalmos Musculoskeletal: no gross deformities, strength intact in all four extremities, no gross restriction of joint movements Skin:  no rashes, no hyperemia Neurological: no tremor with outstretched hands   CMP ( most recent) CMP     Component Value Date/Time   NA 137 10/06/2022 1605   K 4.1 10/06/2022 1605   CL 95 (L) 10/06/2022 1605   CO2 25 10/06/2022 1605   GLUCOSE 141 (H) 10/06/2022 1605   GLUCOSE 113 (H) 04/21/2022 0909   BUN 12 10/06/2022 1605   CREATININE 0.86 10/06/2022 1605   CREATININE 0.62 07/23/2014 1548   CALCIUM 10.1 10/06/2022 1605   PROT 6.8 10/06/2022 1605   ALBUMIN 4.5 10/06/2022 1605   AST 21 10/06/2022 1605   ALT 37 (H) 10/06/2022 1605   ALKPHOS 80 10/06/2022 1605   BILITOT 0.3 10/06/2022 1605   EGFR 70 10/06/2022 1605   GFRNONAA >60 04/21/2022 0909     Diabetic Labs (most recent): Lab Results  Component Value Date   HGBA1C 6.4 (H) 10/06/2022   HGBA1C 6.1 (H) 06/03/2022   HGBA1C 6.1 (H) 02/01/2022     Lipid Panel ( most  recent) Lipid Panel     Component Value Date/Time   CHOL 149 05/26/2021 1619   TRIG 94 05/26/2021 1619   HDL 59 05/26/2021 1619   CHOLHDL 2.5 05/26/2021 1619   LDLCALC 73 05/26/2021 1619   LABVLDL 17 05/26/2021 1619       Lab Results  Component Value Date   TSH 7.040 (H) 12/13/2022   TSH 0.607 09/09/2022   TSH 10.300 (H) 06/03/2022   TSH 8.640 (H) 03/30/2022   TSH 17.800 (H) 02/01/2022   TSH 1.390 10/05/2021   TSH 0.333 (L) 07/01/2021   TSH 0.716 05/26/2021   TSH 1.100 01/14/2021   TSH 6.640 (H) 10/30/2020   FREET4 1.50 12/13/2022   FREET4 2.07 (H) 09/09/2022   FREET4 1.10 06/03/2022   FREET4 1.45 03/30/2022   FREET4 1.17 02/01/2022   FREET4 1.59 10/05/2021   FREET4 2.25 (H) 07/01/2021   FREET4 1.88 (H) 05/26/2021   FREET4 1.63 01/14/2021   FREET4 1.30 10/30/2020     Latest Reference Range & Units 10/05/21  16:31 02/01/22 14:42 03/30/22 15:23 06/03/22 14:38 09/09/22 15:15 12/13/22 13:06  TSH 0.450 - 4.500 uIU/mL 1.390 17.800 (H) 8.640 (H) 10.300 (H) 0.607 7.040 (H)  Triiodothyronine,Free,Serum 2.0 - 4.4 pg/mL 2.5  2.8 2.1    Reverse T3, Serum 9.2 - 24.1 ng/dL 16.1       W9,UEAV(WUJWJX) 0.82 - 1.77 ng/dL 9.14 7.82 9.56 2.13 0.86 (H) 1.50  Thyroperoxidase Ab SerPl-aCnc 0 - 34 IU/mL <9       (H): Data is abnormally high  Assessment & Plan:   ASSESSMENT / PLAN:  1. Hypothyroidism- postsurgical  Patient with long-standing hypothyroidism, on levothyroxine therapy.      Her previsit TFTs are consistent with appropriate hormone replacement (TSH is slightly elevated but Free T4 is WNL).  This is consistent with her taking her medications properly on a day-to-day basis.  She says her kids will also help remind her to take her medications as well. She is advised continue her dose of Levothyroxine 125 mcg po daily before breakfast.   - We discussed about correct intake of levothyroxine, at fasting, with water, separated by at least 30 minutes from breakfast, and separated by  more than 4 hours from calcium, iron, multivitamins, acid reflux medications (PPIs). -Patient is made aware of the fact that thyroid hormone replacement is needed for life, dose to be adjusted by periodic monitoring of thyroid function tests.  - Will check thyroid tests before next visit: TSH, free T4.    She is advised to reach out to her PCP/ neurologist for concerns with worsening memory impairment.  She did drive herself to her appointment today but admits she sometimes gets turned around and cannot remember how to get to places.    I spent  40  minutes in the care of the patient today including review of labs from Thyroid Function, CMP, and other relevant labs ; imaging/biopsy records (current and previous including abstractions from other facilities); face-to-face time discussing  her lab results and symptoms, medications doses, her options of short and long term treatment based on the latest standards of care / guidelines;   and documenting the encounter.  Avel Peace  participated in the discussions, expressed understanding, and voiced agreement with the above plans.  All questions were answered to her satisfaction. she is encouraged to contact clinic should she have any questions or concerns prior to her return visit.   FOLLOW UP PLAN:  Return in about 3 months (around 03/27/2023) for Thyroid follow up, Previsit labs.  Ronny Bacon, [facility] [address] [place], [place] [address] Phone: [phone] Fax: [phone]  12/27/2022, 3:08 PM

## 2023-01-03 ENCOUNTER — Telehealth: Payer: Self-pay | Admitting: Internal Medicine

## 2023-01-03 NOTE — Telephone Encounter (Signed)
FYI, patient called after hours left voicemail has now for 3 days diarrhea, indigestion and has been lethargic.  Having very big burps.  Called patient and asked patient to do video call unable has no camera on phone. Not able to come into office. Patient said will call back tomorrow if needs to be seen or ER.

## 2023-01-04 ENCOUNTER — Telehealth: Payer: Self-pay

## 2023-01-04 NOTE — Telephone Encounter (Signed)
Copied from CRM [id number]. Topic: General - Other >> Jan 04, 2023  2:18 PM Deaijah H wrote: Reason for CRM: Patient called in wanting to let Dr. Allena Katz know that she is  feeling better

## 2023-01-04 NOTE — Telephone Encounter (Signed)
Recommended ER visit

## 2023-01-07 ENCOUNTER — Emergency Department (HOSPITAL_COMMUNITY): Payer: PPO

## 2023-01-07 ENCOUNTER — Inpatient Hospital Stay (HOSPITAL_COMMUNITY)
Admission: EM | Admit: 2023-01-07 | Discharge: 2023-01-11 | DRG: 640 | Disposition: A | Discharge status: Home Health | Payer: PPO | Attending: Internal Medicine | Admitting: Internal Medicine

## 2023-01-07 ENCOUNTER — Encounter (HOSPITAL_COMMUNITY): Payer: Self-pay

## 2023-01-07 DIAGNOSIS — Z961 Presence of intraocular lens: Secondary | ICD-10-CM | POA: Diagnosis present

## 2023-01-07 DIAGNOSIS — K5730 Diverticulosis of large intestine without perforation or abscess without bleeding: Secondary | ICD-10-CM | POA: Diagnosis not present

## 2023-01-07 DIAGNOSIS — R410 Disorientation, unspecified: Principal | ICD-10-CM

## 2023-01-07 DIAGNOSIS — R61 Generalized hyperhidrosis: Secondary | ICD-10-CM | POA: Diagnosis not present

## 2023-01-07 DIAGNOSIS — I452 Bifascicular block: Secondary | ICD-10-CM | POA: Diagnosis not present

## 2023-01-07 DIAGNOSIS — S3991XA Unspecified injury of abdomen, initial encounter: Secondary | ICD-10-CM | POA: Diagnosis not present

## 2023-01-07 DIAGNOSIS — M6281 Muscle weakness (generalized): Secondary | ICD-10-CM | POA: Diagnosis not present

## 2023-01-07 DIAGNOSIS — Z8249 Family history of ischemic heart disease and other diseases of the circulatory system: Secondary | ICD-10-CM

## 2023-01-07 DIAGNOSIS — F0393 Unspecified dementia, unspecified severity, with mood disturbance: Secondary | ICD-10-CM | POA: Diagnosis not present

## 2023-01-07 DIAGNOSIS — Z7901 Long term (current) use of anticoagulants: Secondary | ICD-10-CM | POA: Diagnosis not present

## 2023-01-07 DIAGNOSIS — G4733 Obstructive sleep apnea (adult) (pediatric): Secondary | ICD-10-CM | POA: Diagnosis not present

## 2023-01-07 DIAGNOSIS — I2510 Atherosclerotic heart disease of native coronary artery without angina pectoris: Secondary | ICD-10-CM | POA: Diagnosis not present

## 2023-01-07 DIAGNOSIS — Z0389 Encounter for observation for other suspected diseases and conditions ruled out: Secondary | ICD-10-CM | POA: Diagnosis not present

## 2023-01-07 DIAGNOSIS — R109 Unspecified abdominal pain: Secondary | ICD-10-CM | POA: Diagnosis not present

## 2023-01-07 DIAGNOSIS — G9341 Metabolic encephalopathy: Secondary | ICD-10-CM | POA: Diagnosis not present

## 2023-01-07 DIAGNOSIS — F0280 Dementia in other diseases classified elsewhere without behavioral disturbance: Secondary | ICD-10-CM | POA: Diagnosis present

## 2023-01-07 DIAGNOSIS — J309 Allergic rhinitis, unspecified: Secondary | ICD-10-CM | POA: Diagnosis present

## 2023-01-07 DIAGNOSIS — E876 Hypokalemia: Secondary | ICD-10-CM | POA: Insufficient documentation

## 2023-01-07 DIAGNOSIS — E871 Hypo-osmolality and hyponatremia: Secondary | ICD-10-CM | POA: Diagnosis not present

## 2023-01-07 DIAGNOSIS — E872 Acidosis, unspecified: Secondary | ICD-10-CM | POA: Diagnosis not present

## 2023-01-07 DIAGNOSIS — Z8673 Personal history of transient ischemic attack (TIA), and cerebral infarction without residual deficits: Secondary | ICD-10-CM

## 2023-01-07 DIAGNOSIS — Z881 Allergy status to other antibiotic agents status: Secondary | ICD-10-CM

## 2023-01-07 DIAGNOSIS — I10 Essential (primary) hypertension: Secondary | ICD-10-CM | POA: Diagnosis not present

## 2023-01-07 DIAGNOSIS — Y636 Underdosing and nonadministration of necessary drug, medicament or biological substance: Secondary | ICD-10-CM | POA: Diagnosis present

## 2023-01-07 DIAGNOSIS — D7389 Other diseases of spleen: Secondary | ICD-10-CM | POA: Diagnosis not present

## 2023-01-07 DIAGNOSIS — E861 Hypovolemia: Secondary | ICD-10-CM | POA: Diagnosis present

## 2023-01-07 DIAGNOSIS — I480 Paroxysmal atrial fibrillation: Secondary | ICD-10-CM | POA: Diagnosis not present

## 2023-01-07 DIAGNOSIS — Z87891 Personal history of nicotine dependence: Secondary | ICD-10-CM | POA: Diagnosis not present

## 2023-01-07 DIAGNOSIS — Z818 Family history of other mental and behavioral disorders: Secondary | ICD-10-CM

## 2023-01-07 DIAGNOSIS — R9082 White matter disease, unspecified: Secondary | ICD-10-CM | POA: Diagnosis not present

## 2023-01-07 DIAGNOSIS — Z880 Allergy status to penicillin: Secondary | ICD-10-CM

## 2023-01-07 DIAGNOSIS — F319 Bipolar disorder, unspecified: Secondary | ICD-10-CM | POA: Diagnosis not present

## 2023-01-07 DIAGNOSIS — Z7984 Long term (current) use of oral hypoglycemic drugs: Secondary | ICD-10-CM

## 2023-01-07 DIAGNOSIS — Z79890 Hormone replacement therapy (postmenopausal): Secondary | ICD-10-CM

## 2023-01-07 DIAGNOSIS — E860 Dehydration: Principal | ICD-10-CM | POA: Diagnosis present

## 2023-01-07 DIAGNOSIS — E890 Postprocedural hypothyroidism: Secondary | ICD-10-CM | POA: Diagnosis not present

## 2023-01-07 DIAGNOSIS — Z823 Family history of stroke: Secondary | ICD-10-CM

## 2023-01-07 DIAGNOSIS — E785 Hyperlipidemia, unspecified: Secondary | ICD-10-CM | POA: Diagnosis not present

## 2023-01-07 DIAGNOSIS — Z882 Allergy status to sulfonamides status: Secondary | ICD-10-CM

## 2023-01-07 DIAGNOSIS — I4891 Unspecified atrial fibrillation: Secondary | ICD-10-CM | POA: Diagnosis not present

## 2023-01-07 DIAGNOSIS — Z6834 Body mass index (BMI) 34.0-34.9, adult: Secondary | ICD-10-CM

## 2023-01-07 DIAGNOSIS — D519 Vitamin B12 deficiency anemia, unspecified: Secondary | ICD-10-CM | POA: Diagnosis present

## 2023-01-07 DIAGNOSIS — I6523 Occlusion and stenosis of bilateral carotid arteries: Secondary | ICD-10-CM | POA: Diagnosis not present

## 2023-01-07 DIAGNOSIS — K219 Gastro-esophageal reflux disease without esophagitis: Secondary | ICD-10-CM | POA: Diagnosis present

## 2023-01-07 DIAGNOSIS — W1830XA Fall on same level, unspecified, initial encounter: Secondary | ICD-10-CM | POA: Diagnosis present

## 2023-01-07 DIAGNOSIS — E119 Type 2 diabetes mellitus without complications: Secondary | ICD-10-CM | POA: Diagnosis present

## 2023-01-07 DIAGNOSIS — E66811 Obesity, class 1: Secondary | ICD-10-CM | POA: Diagnosis present

## 2023-01-07 DIAGNOSIS — Z8349 Family history of other endocrine, nutritional and metabolic diseases: Secondary | ICD-10-CM

## 2023-01-07 DIAGNOSIS — Z7983 Long term (current) use of bisphosphonates: Secondary | ICD-10-CM

## 2023-01-07 DIAGNOSIS — Z79899 Other long term (current) drug therapy: Secondary | ICD-10-CM

## 2023-01-07 DIAGNOSIS — S3993XA Unspecified injury of pelvis, initial encounter: Secondary | ICD-10-CM | POA: Diagnosis not present

## 2023-01-07 DIAGNOSIS — Z9049 Acquired absence of other specified parts of digestive tract: Secondary | ICD-10-CM

## 2023-01-07 DIAGNOSIS — R531 Weakness: Secondary | ICD-10-CM | POA: Diagnosis not present

## 2023-01-07 DIAGNOSIS — E1169 Type 2 diabetes mellitus with other specified complication: Secondary | ICD-10-CM

## 2023-01-07 DIAGNOSIS — Z96652 Presence of left artificial knee joint: Secondary | ICD-10-CM | POA: Diagnosis present

## 2023-01-07 DIAGNOSIS — S0990XA Unspecified injury of head, initial encounter: Secondary | ICD-10-CM | POA: Diagnosis not present

## 2023-01-07 DIAGNOSIS — Z833 Family history of diabetes mellitus: Secondary | ICD-10-CM

## 2023-01-07 LAB — URINALYSIS, W/ REFLEX TO CULTURE (INFECTION SUSPECTED)
Bacteria, UA: NONE SEEN
Bilirubin Urine: NEGATIVE
Glucose, UA: NEGATIVE mg/dL
Hgb urine dipstick: NEGATIVE
Ketones, ur: 5 mg/dL — AB
Leukocytes,Ua: NEGATIVE
Nitrite: NEGATIVE
Protein, ur: 30 mg/dL — AB
Specific Gravity, Urine: 1.016 (ref 1.005–1.030)
pH: 7 (ref 5.0–8.0)

## 2023-01-07 LAB — LACTIC ACID, PLASMA
Lactic Acid, Venous: 1.3 mmol/L (ref 0.5–1.9)
Lactic Acid, Venous: 2.6 mmol/L (ref 0.5–1.9)
Lactic Acid, Venous: 1.6 mmol/L (ref 0.5–1.9)

## 2023-01-07 LAB — PROTIME-INR
INR: 1 (ref 0.8–1.2)
Prothrombin Time: 13.3 s (ref 11.4–15.2)

## 2023-01-07 LAB — COMPREHENSIVE METABOLIC PANEL
ALT: 66 U/L — ABNORMAL HIGH (ref 0–44)
AST: 39 U/L (ref 15–41)
Albumin: 3.6 g/dL (ref 3.5–5.0)
Alkaline Phosphatase: 142 U/L — ABNORMAL HIGH (ref 38–126)
Anion gap: 10 (ref 5–15)
BUN: 7 mg/dL — ABNORMAL LOW (ref 8–23)
CO2: 23 mmol/L (ref 22–32)
Calcium: 8.9 mg/dL (ref 8.9–10.3)
Chloride: 95 mmol/L — ABNORMAL LOW (ref 98–111)
Creatinine, Ser: 0.57 mg/dL (ref 0.44–1.00)
GFR, Estimated: >60 mL/min (ref >60)
Glucose, Bld: 122 mg/dL — ABNORMAL HIGH (ref 70–99)
Potassium: 3.2 mmol/L — ABNORMAL LOW (ref 3.5–5.1)
Sodium: 128 mmol/L — ABNORMAL LOW (ref 135–145)
Total Bilirubin: 0.8 mg/dL (ref <1.2)
Total Protein: 6.5 g/dL (ref 6.5–8.1)

## 2023-01-07 LAB — TSH: TSH: 3.674 u[IU]/mL (ref 0.350–4.500)

## 2023-01-07 LAB — CBC WITH DIFFERENTIAL/PLATELET
Abs Immature Granulocytes: 0.05 10*3/uL (ref 0.00–0.07)
Basophils Absolute: 0 10*3/uL (ref 0.0–0.1)
Basophils Relative: 0 %
Eosinophils Absolute: 0 10*3/uL (ref 0.0–0.5)
Eosinophils Relative: 0 %
HCT: 38.1 % (ref 36.0–46.0)
Hemoglobin: 13.5 g/dL (ref 12.0–15.0)
Immature Granulocytes: 0 %
Lymphocytes Relative: 6 %
Lymphs Abs: 0.7 10*3/uL (ref 0.7–4.0)
MCH: 32.8 pg (ref 26.0–34.0)
MCHC: 35.4 g/dL (ref 30.0–36.0)
MCV: 92.5 fL (ref 80.0–100.0)
Monocytes Absolute: 1 10*3/uL (ref 0.1–1.0)
Monocytes Relative: 9 %
Neutro Abs: 10.2 10*3/uL — ABNORMAL HIGH (ref 1.7–7.7)
Neutrophils Relative %: 85 %
Platelets: 351 10*3/uL (ref 150–400)
RBC: 4.12 MIL/uL (ref 3.87–5.11)
RDW: 11.9 % (ref 11.5–15.5)
WBC: 12 10*3/uL — ABNORMAL HIGH (ref 4.0–10.5)
nRBC: 0 % (ref 0.0–0.2)

## 2023-01-07 LAB — APTT: aPTT: 26 s (ref 24–36)

## 2023-01-07 LAB — MAGNESIUM: Magnesium: 2.1 mg/dL (ref 1.7–2.4)

## 2023-01-07 LAB — CK: Total CK: 162 U/L (ref 38–234)

## 2023-01-07 MED ORDER — IOHEXOL 300 MG/ML  SOLN
100.0000 mL | Freq: Once | INTRAMUSCULAR | Status: AC | PRN
  Administered 2023-01-07: 100 mL via INTRAVENOUS

## 2023-01-07 MED ORDER — SODIUM CHLORIDE 0.9 % IV BOLUS (SEPSIS)
1000.0000 mL | Freq: Once | INTRAVENOUS | Status: AC
  Administered 2023-01-07: 1000 mL via INTRAVENOUS

## 2023-01-07 MED ORDER — POTASSIUM CHLORIDE CRYS ER 20 MEQ PO TBCR
40.0000 meq | EXTENDED_RELEASE_TABLET | Freq: Once | ORAL | Status: AC
  Administered 2023-01-07: 40 meq via ORAL
  Filled 2023-01-07: qty 2

## 2023-01-07 NOTE — ED Notes (Signed)
Son aware of transport status

## 2023-01-07 NOTE — ED Provider Notes (Signed)
[place] [facility]MENT AT [facility] Provider Note   CSN: [id number] Arrival date & time: 01/07/23  1023     History  Chief Complaint  Patient presents with   [name]    [name] is a 77 y.o. female.  She has PMH of bipolar disorder, SVT, postoperative hypothyroidism, B12 deficiency anemia, GERD, IBS, atrial flutter, sleep apnea. Patient comes the ER today for generalized weakness and confusion.  She states this started yesterday.  She felt weak all over, was having to crawl rather than walk and noted some burning with urination.  She states she kept hearing the phone ring but could not find it so was crawling around trying to look for it.  She has any nausea or vomiting, denies any pain.  Her son is at bedside as well and states she has been complaining of diarrhea several times a day for the past week.  In addition she recently had abnormal thyroid test and had her medication adjusted. Patient has any chest pain or shortness of breath.  Son found her on the floor today.  She says she been laying on the floor most of the day but states she did not fall, just had to lower herself to the ground.   Fall       Home Medications Prior to Admission medications   Medication Sig Start Date End Date Taking? Authorizing Provider  acetaminophen (TYLENOL) 500 MG tablet Take 1,000 mg by mouth every 6 (six) hours as needed for moderate pain or headache.    [provider]  albuterol (VENTOLIN HFA) 108 (90 Base) MCG/ACT inhaler Inhale 2 puffs into the lungs every 6 (six) hours as needed for wheezing or shortness of breath. 09/02/20   Heather Roberts, NP  alendronate (FOSAMAX) 70 MG tablet TAKE 1 TABLET EVERY WEEK IN THE MORNING 30 MIN BEFORE EATING WITH AN 8OZ GLASS OF WATER (SIT UP 30 MIN) 02/01/22   Anabel Halon, MD  atorvastatin (LIPITOR) 80 MG tablet Take 1 tablet (80 mg total) by mouth daily. 10/06/22   Anabel Halon, MD  blood glucose meter kit and supplies  KIT Dispense based on patient and insurance preference. Use up to four times daily as directed. 02/01/22   Anabel Halon, MD  buPROPion (WELLBUTRIN XL) 300 MG 24 hr tablet Take 300 mg by mouth daily.  02/08/14   [provider]  clonazePAM (KLONOPIN) 0.5 MG tablet Take 0.25-0.5 mg by mouth in the morning, at noon, and at bedtime.      DEXILANT 60 MG capsule Take 1 capsule by mouth daily.    [provider]  diltiazem (CARDIZEM CD) 240 MG 24 hr capsule TAKE (1) CAPSULE BY MOUTH ONCE DAILY. 08/26/22   Jonelle Sidle, MD  donepezil (ARICEPT) 5 MG tablet TAKE (1) TABLET BY MOUTH AT BEDTIME. 12/07/22   Anabel Halon, MD  DULoxetine (CYMBALTA) 20 MG capsule Take 20 mg by mouth 2 (two) times daily. 12/07/22   [provider]  DULoxetine HCl 20 MG CSDR Take 20 mg by mouth 2 (two) times daily.    [provider]  ferrous sulfate 325 (65 FE) MG tablet Take 325 mg by mouth daily with breakfast.    [provider]  fluconazole (DIFLUCAN) 150 MG tablet Take 150 mg by mouth once. 12/03/22   [provider]  Lancets (ONETOUCH DELICA PLUS LANCET33G) MISC USE TO TEST BLOOD SUGAR TWICE DAILY. 07/31/21   Anabel Halon, MD  levothyroxine (SYNTHROID) 125 MCG tablet Take 1 tablet (125 mcg total) by mouth daily before breakfast. 12/27/22   Dani Gobble, NP  losartan (COZAAR) 50 MG tablet Take 1 tablet (50 mg total) by mouth 2 (two) times daily. 10/06/22   Patel, Earlie Lou, MD  LYBALVI 5-10 MG TABS Take 1 tablet by mouth at bedtime. 03/10/20   [provider]  metFORMIN (GLUCOPHAGE) 500 MG tablet Take 1 tablet (500 mg total) by mouth 2 (two) times daily with a meal. 10/06/22   Anabel Halon, MD  mirabegron ER (MYRBETRIQ) 25 MG TB24 tablet Take 1 tablet (25 mg total) by mouth daily. 10/06/22   Anabel Halon, MD  Multiple Vitamins-Minerals (ICAPS AREDS 2 PO) Take 1 capsule by mouth 2 (two) times daily.    [provider]  mupirocin ointment  (BACTROBAN) 2 % Apply 1 Application topically. 09/09/22   [provider]  Na Sulfate-K Sulfate-Mg Sulf 17.5-3.13-1.6 GM/177ML SOLN As directed 03/17/22   Rourk, Gerrit Friends, MD  ondansetron (ZOFRAN) 4 MG tablet Take 1 tablet (4 mg total) by mouth every 8 (eight) hours as needed for nausea or vomiting. 06/11/21   Anabel Halon, MD  [facility] ULTRA TEST test strip USE TO TEST BLOOD SUGAR TWICE DAILY. 10/25/22   Anabel Halon, MD  pantoprazole (PROTONIX) 40 MG tablet TAKE 1 TABLET DAILY (LUNCH). 12/20/22   Anabel Halon, MD  topiramate (TOPAMAX) 50 MG tablet Take 50 mg by mouth at bedtime. 12/09/22   [provider]      Allergies    Amoxicillin-pot clavulanate, Atenolol, Penicillins, and Sulfonamide derivatives    Review of Systems   Review of Systems  Physical Exam Updated Vital Signs BP 101/78   Pulse 78   Temp 98.2 F (36.8 C) (Oral)   Resp 18   Ht 5\' 2"  (1.575 m)   Wt 86.2 kg   SpO2 99%   BMI 34.75 kg/m  Physical Exam Vitals and nursing note reviewed.  Constitutional:      General: She is not in acute distress.    Appearance: She is well-developed.  HENT:     Head: Normocephalic and atraumatic.     Mouth/Throat:     Mouth: Mucous membranes are dry.  Eyes:     Extraocular Movements: Extraocular movements intact.     Conjunctiva/sclera: Conjunctivae normal.     Pupils: Pupils are equal, round, and reactive to light.  Cardiovascular:     Rate and Rhythm: Normal rate and regular rhythm.     Heart sounds: No murmur heard. Pulmonary:     Effort: Pulmonary effort is normal. No respiratory distress.     Breath sounds: Normal breath sounds. No wheezing or rales.  Abdominal:     Palpations: Abdomen is soft.     Tenderness: There is abdominal tenderness. There is no guarding or rebound.     Comments: Mild suprapubic tenderness  Musculoskeletal:        General: No swelling. Normal range of motion.     Cervical back: Neck supple.  Skin:    General: Skin is  warm and dry.     Capillary Refill: Capillary refill takes less than 2 seconds.  Neurological:     General: No focal deficit present.     Mental Status: She is alert. She is disoriented.     Sensory: No sensory deficit.     Comments: Patient oriented to person and place but not oriented to time but she is oriented  to situation  Psychiatric:        Mood and Affect: Mood normal.     ED Results / Procedures / Treatments   Labs (all labs ordered are listed, but only abnormal results are displayed) Labs Reviewed  LACTIC ACID, PLASMA - Abnormal; Notable for the following components:      Result Value   Lactic Acid, Venous 2.6 (*)    All other components within normal limits  COMPREHENSIVE METABOLIC PANEL - Abnormal; Notable for the following components:   Sodium 128 (*)    Potassium 3.2 (*)    Chloride 95 (*)    Glucose, Bld 122 (*)    BUN 7 (*)    ALT 66 (*)    Alkaline Phosphatase 142 (*)    All other components within normal limits  CBC WITH DIFFERENTIAL/PLATELET - Abnormal; Notable for the following components:   WBC 12.0 (*)    Neutro Abs 10.2 (*)    All other components within normal limits  URINALYSIS, W/ REFLEX TO CULTURE (INFECTION SUSPECTED) - Abnormal; Notable for the following components:   Ketones, ur 5 (*)    Protein, ur 30 (*)    All other components within normal limits  CULTURE, BLOOD (ROUTINE X 2)  CULTURE, BLOOD (ROUTINE X 2)  URINE CULTURE  LACTIC ACID, PLASMA  PROTIME-INR  APTT  TSH  CK  MAGNESIUM  LACTIC ACID, PLASMA    EKG EKG Interpretation Date/Time:  Friday January 07 2023 12:09:18 EST Ventricular Rate:  70 PR Interval:  162 QRS Duration:  147 QT Interval:  447 QTC Calculation: 483 R Axis:   -52  Text Interpretation: Sinus rhythm Probable left atrial enlargement RBBB and LAFB Left ventricular hypertrophy Confirmed by Pricilla Loveless [phone]) on 01/07/2023 2:13:00 PM  Radiology CT ABDOMEN PELVIS W CONTRAST Result Date:  01/07/2023 CLINICAL DATA:  Fall at home. Blunt trauma. Generalized abdominal pain. Weakness. EXAM: CT ABDOMEN AND PELVIS WITH CONTRAST TECHNIQUE: Multidetector CT imaging of the abdomen and pelvis was performed using the standard protocol following bolus administration of intravenous contrast. RADIATION DOSE REDUCTION: This exam was performed according to the departmental dose-optimization program which includes automated exposure control, adjustment of the mA and/or kV according to patient size and/or use of iterative reconstruction technique. CONTRAST:  OMNIPAQUE IOHEXOL 300 MG/ML  SOLN COMPARISON:  05/05/2012 FINDINGS: Lower chest:  Unremarkable. Hepatobiliary: No hepatic laceration or mass identified. Prior cholecystectomy again noted. Increased diffuse biliary ductal dilatation is seen since prior study, however, no obstructing etiology is apparent by CT. Pancreas: No parenchymal laceration, mass, or inflammatory changes identified. No evidence of pancreatic ductal dilatation. Spleen: No evidence of splenic laceration. Adrenal/Urinary Tract: No hemorrhage or parenchymal lacerations identified. No evidence of suspicious renal masses or hydronephrosis. Unremarkable unopacified urinary bladder. Stomach/Bowel: Unopacified bowel loops are unremarkable in appearance. No evidence of hemoperitoneum. Diverticulosis is seen mainly involving the sigmoid colon, however there is no evidence of diverticulitis. Vascular/Lymphatic: No evidence of abdominal aortic injury. No pathologically enlarged lymph nodes identified. Reproductive:  No mass or other significant abnormality identified. Other:  None. Musculoskeletal: No acute fractures or suspicious bone lesions identified. IMPRESSION: No evidence of traumatic injury within the abdomen or pelvis. Prior cholecystectomy. Increased diffuse biliary ductal dilatation since prior study, however, no obstructing etiology is apparent by CT. Recommend correlation with liver  function tests, and consider MRCP for further evaluation. Colonic diverticulosis, without radiographic evidence of diverticulitis. Electronically Signed   By: Danae Orleans M.D.   On: 01/07/2023 17:42  DG Chest Port 1 View Result Date: 01/07/2023 CLINICAL DATA:  77 year old female with possible sepsis. EXAM: PORTABLE CHEST 1 VIEW COMPARISON:  Portable chest 11/05/2019. FINDINGS: Portable AP view at elevn 50 hours. Lung volumes and mediastinal contours remain normal. Visualized tracheal air column is within normal limits. Allowing for portable technique the lungs are clear. No pneumothorax or pleural effusion. Stable cholecystectomy clips. Small chronic rim calcified splenic lesion in the left upper quadrant, stable from a 2014 CT Abdomen and Pelvis negative visible bowel gas. No acute osseous abnormality identified. IMPRESSION: No acute cardiopulmonary abnormality. Electronically Signed   By: Odessa Fleming M.D.   On: 01/07/2023 13:06   CT Head Wo Contrast Result Date: 01/07/2023 CLINICAL DATA:  Patient found on floor. Trauma. Patient is unsure if she lost consciousness. EXAM: CT HEAD WITHOUT CONTRAST TECHNIQUE: Contiguous axial images were obtained from the base of the skull through the vertex without intravenous contrast. RADIATION DOSE REDUCTION: This exam was performed according to the departmental dose-optimization program which includes automated exposure control, adjustment of the mA and/or kV according to patient size and/or use of iterative reconstruction technique. COMPARISON:  CT head without contrast 07/04/2014. MR head 11/14/2019 FINDINGS: Brain: Moderate generalized atrophy and white matter disease demonstrates continued progression. No acute infarct, hemorrhage, or mass lesion is present. The ventricles are proportionate to the degree of atrophy. No significant extraaxial fluid collection is present. The brainstem and cerebellum are within normal limits. Midline structures are within normal limits.  Vascular: Atherosclerotic calcifications are present within the cavernous internal carotid arteries bilaterally. No hyperdense vessel is present. Skull: Calvarium is intact. No focal lytic or blastic lesions are present. No significant extracranial soft tissue lesion is present. Sinuses/Orbits: Posterior right ethmoid air cell is partially opacified. The paranasal sinuses and mastoid air cells are otherwise clear. Bilateral lens replacements are noted. Globes and orbits are otherwise unremarkable. IMPRESSION: 1. No acute intracranial abnormality or significant interval change. 2. Moderate generalized atrophy and white matter disease demonstrates continued progression. This likely reflects the sequela of chronic microvascular ischemia. Electronically Signed   By: Marin Roberts M.D.   On: 01/07/2023 12:55    Procedures Procedures    Medications Ordered in ED Medications  sodium chloride 0.9 % bolus 1,000 mL (0 mLs Intravenous Stopped 01/07/23 1309)  sodium chloride 0.9 % bolus 1,000 mL (0 mLs Intravenous Stopped 01/07/23 1608)  iohexol (OMNIPAQUE) 300 MG/ML solution 100 mL (100 mLs Intravenous Contrast Given 01/07/23 1523)  potassium chloride SA (KLOR-CON M) CR tablet 40 mEq (40 mEq Oral Given 01/07/23 1825)    ED Course/ Medical Decision Making/ A&P Clinical Course as of 01/07/23 1920  Fri Jan 07, 2023  1419 Presented with confusion, low blood pressure, generalized weakness, been having some diarrhea but actually having no abdominal pain, no vomiting, no blood in the stool.  She is having mild leukocytosis but not we started criteria, felt like she had a UTI but urinalysis is normal.  On reexam she still confused, she thought up and urinated a trash can per the RN but patient does remember doing this.  She still thinks it is 41 or 51.  Does not restrict any of her medications today.  She is now having some tenderness in her suprapubic area on exam, given elevated lactate CT abdomen pelvis  but plan for admission is normal.  Labs do show Electra abnormalities of sodium 128 and potassium 3.2 likely secondary to dehydration from diarrhea [CB]  1607 CT ABDOMEN PELVIS W CONTRAST [CB]  Clinical Course User Index [CB] Ma Rings, PA-C                                 Medical Decision Making This patient presents to the ED for concern of altered mental status with confusion, this involves an extensive number of treatment options, and is a complaint that carries with it a high risk of complications and morbidity.  The differential diagnosis includes , metabolic encephalopathy, sepsis, CVA, progressive dementia, other   Co morbidities that complicate the patient evaluation :   Hypothyroidism, SVT, bipolar disorder, a flutter, sleep apnea   Additional history obtained:  Additional history obtained from EMR External records from outside source obtained and reviewed including prior notes   Lab Tests:  I Ordered, and personally interpreted labs.  The pertinent results include: CBC shows mild leukocytosis of 12.0, PT and INR, CMP shows sodium 128, potassium 3.2, chloride 95, normal renal function, slight elevation of ALT at 66 and alk phos at 142 No UTI on urinalysis   Imaging Studies ordered:  I ordered imaging studies including CT head which shows no intracranial hemorrhage, no masses, chest x-ray shows no pulmonary edema or infiltrate, CT abdomen pelvis shows no acute findings I independently visualized and interpreted imaging within scope of identifying emergent findings  I agree with the radiologist interpretation   Cardiac Monitoring: / EKG:  The patient was maintained on a cardiac monitor.  I personally viewed and interpreted the cardiac monitored which showed an underlying rhythm of: Sinus rhythm   Consultations Obtained:  I requested consultation with the hospitalist Dr. Adrian Blackwater,  and discussed lab and imaging findings as well as pertinent plan - they  recommend: Patient due to patient's confusion and likely dehydration, he will evaluate patient.   Problem List / ED Course / Critical interventions / Medication management  Patient having generalized weakness which seems to be improved after IV fluids, blood pressure is improved.  She has been having diarrhea for about a week several times a day.  No blood in the stool.  Having some mild lower abdominal pain with this CT is normal, became confused yesterday and thought she was having some dysuria as well urinalysis is normal, culture is ordered.  Persistently confused about place, no meningeal signs, no focal neurologic findings otherwise.  No concern for meningitis on my exam.  Patient was also evaluated by Dr. Criss Alvine I discussed with patient's son need for admission and patient who are agreeable.  She is now able to get out of bed and bear weight, and actually urinated in the trash can but did not remember this. I ordered medication including normal saline, potassium for dehydration Reevaluation of the patient after these medicines showed that the patient improved I have reviewed the patients home medicines and have made adjustments as needed      Amount and/or Complexity of Data Reviewed Labs: ordered. Radiology: ordered. Decision-making details documented in ED Course. ECG/medicine tests: ordered.  Risk Prescription drug management. Decision regarding hospitalization.           Final Clinical Impression(s) / ED Diagnoses Final diagnoses:  Confusion  Dehydration    Rx / DC Orders ED Discharge Orders     None         Ma Rings, PA-C 01/07/23 Allyn Kenner, MD 01/08/23 1003

## 2023-01-07 NOTE — ED Triage Notes (Addendum)
Pt brought in by [facility] states patient's son found patient on the floor this AM and began c/o generalized weakness. Pt unsure if she hit or head or lost consciousness. Pt currently alert & oriented.  EMS: CBG 119, BP 123/87, HR 76

## 2023-01-07 NOTE — Progress Notes (Signed)
CRITICAL VALUE STICKER  CRITICAL VALUE: lactic acid 2.6  MD NOTIFIED: Carmel Sacramento, PA-C  TIME OF NOTIFICATION: [phone]

## 2023-01-07 NOTE — ED Notes (Signed)
ED TO INPATIENT HANDOFF REPORT  ED Nurse Name and Phone #:   S Name/Age/Gender [name] 77 y.o. female Room/Bed: APOTF/OTF  Code Status   Code Status: Full Code  Home/SNF/Other Home Patient oriented to: self, place, and situation Is this baseline? Yes   Triage Complete: Triage complete  Chief Complaint Hyponatremia [E87.1]  Triage Note Pt brought in by [facility] states patient's son found patient on the floor this AM and began c/o generalized weakness. Pt unsure if she hit or head or lost consciousness. Pt currently alert & oriented.  EMS: CBG 119, BP 123/87, HR 76   Allergies Allergies  Allergen Reactions   Amoxicillin-Pot Clavulanate Rash   Atenolol Rash   Penicillins Rash    Tolerated Cephalosporin Date: 02/17/21.     Sulfonamide Derivatives Rash    Level of Care/Admitting Diagnosis ED Disposition     ED Disposition  Admit   Condition  --   Comment  Hospital Area: [facility] [[id number]] Level of Care: Med-Surg [16] May admit patient to [facility] or [facility] if equivalent level of care is available:: No Covid Evaluation: Asymptomatic - no recent exposure (last 10 days) testi ng not required Diagnosis: Hyponatremia [093235] Admitting Physician: Levie Heritage [[id number]] Attending Physician: Levie Heritage [[id number]] Certification:: I certify this patient will need inpatient services for at least 2 midnights Expected Medi cal Readiness: 01/09/2023          B Medical/Surgery History Past Medical History:  Diagnosis Date   Allergic rhinitis    Arthritis    B12 deficiency    Bipolar disorder (HCC)    Coronary artery disease    Depression    Diabetes mellitus (HCC)    Diabetes mellitus (HCC) 10/05/2021   Essential hypertension    GERD (gastroesophageal reflux disease)    Hashimoto's thyroiditis    History of transient ischemic attack (TIA)    Or possibly migraine as well as right amaurosis fugax and ataxia - Dr. Sandria Manly    Hyperlipidemia    Hypothyroidism    PSVT (paroxysmal supraventricular tachycardia) (HCC)    Right bundle branch block    Past Surgical History:  Procedure Laterality Date   CATARACT EXTRACTION W/PHACO Left 05/23/2020   Procedure: CATARACT EXTRACTION PHACO AND INTRAOCULAR LENS PLACEMENT LEFT EYE;  Surgeon: Fabio Pierce, MD;  Location: [facility];  Service: Ophthalmology;  Laterality: Left;  left CDE=15.69   CATARACT EXTRACTION W/PHACO Right 06/13/2020   Procedure: CATARACT EXTRACTION PHACO AND INTRAOCULAR LENS PLACEMENT RIGHT EYE;  Surgeon: Fabio Pierce, MD;  Location: [facility];  Service: Ophthalmology;  Laterality: Right;  right CDE=11.33   COLONOSCOPY WITH PROPOFOL N/A 04/22/2022   Procedure: COLONOSCOPY WITH PROPOFOL;  Surgeon: Corbin Ade, MD;  Location: [facility];  Service: Endoscopy;  Laterality: N/A;  8:30 am, asa 3   LAPAROSCOPIC CHOLECYSTECTOMY  2010   POLYPECTOMY  04/22/2022   Procedure: POLYPECTOMY INTESTINAL;  Surgeon: Corbin Ade, MD;  Location: [facility];  Service: Endoscopy;;   THYROIDECTOMY  2009   TOTAL KNEE ARTHROPLASTY Left 02/16/2021   Procedure: TOTAL KNEE ARTHROPLASTY;  Surgeon: Ollen Gross, MD;  Location: [facility];  Service: Orthopedics;  Laterality: Left;     A IV Location/Drains/Wounds Patient Lines/Drains/Airways Status     Active Line/Drains/Airways     Name Placement date Placement time Site Days   Peripheral IV 01/07/23 20 G Anterior;Right Antecubital 01/07/23  1100  Antecubital  less than 1   External Urinary Catheter 01/07/23  2041  --  less than 1            Intake/Output Last 24 hours  Intake/Output Summary (Last 24 hours) at 01/07/2023 2240 Last data filed at 01/07/2023 1608 Gross per 24 hour  Intake 2000 ml  Output --  Net 2000 ml    Labs/Imaging Results for orders placed or performed during the hospital encounter of 01/07/23 (from the past 48 hours)  Lactic acid, plasma     Status: None   Collection Time: 01/07/23 11:09  AM  Result Value Ref Range   Lactic Acid, Venous 1.3 0.5 - 1.9 mmol/L    Comment: Performed at [facility], [address], [place], [place] [address]  Comprehensive metabolic panel     Status: Abnormal   Collection Time: 01/07/23 11:09 AM  Result Value Ref Range   Sodium 128 (L) 135 - 145 mmol/L   Potassium 3.2 (L) 3.5 - 5.1 mmol/L   Chloride 95 (L) 98 - 111 mmol/L   CO2 23 22 - 32 mmol/L   Glucose, Bld 122 (H) 70 - 99 mg/dL    Comment: Glucose reference range applies only to samples taken after fasting for at least 8 hours.   BUN 7 (L) 8 - 23 mg/dL   Creatinine, Ser 6.04 0.44 - 1.00 mg/dL   Calcium 8.9 8.9 - 54.0 mg/dL   Total Protein 6.5 6.5 - 8.1 g/dL   Albumin 3.6 3.5 - 5.0 g/dL   AST 39 15 - 41 U/L   ALT 66 (H) 0 - 44 U/L   Alkaline Phosphatase 142 (H) 38 - 126 U/L   Total Bilirubin 0.8 <1.2 mg/dL   GFR, Estimated >98 >11 mL/min    Comment: (NOTE) Calculated using the CKD-EPI Creatinine Equation (2021)    Anion gap 10 5 - 15    Comment: Performed at [facility], [address] [place], [place] [address]  CBC with Differential     Status: Abnormal   Collection Time: 01/07/23 11:09 AM  Result Value Ref Range   WBC 12.0 (H) 4.0 - 10.5 K/uL   RBC 4.12 3.87 - 5.11 MIL/uL   Hemoglobin 13.5 12.0 - 15.0 g/dL   HCT 29.5 62.1 - 30.8 %   MCV 92.5 80.0 - 100.0 fL   MCH 32.8 26.0 - 34.0 pg   MCHC 35.4 30.0 - 36.0 g/dL   RDW 65.7 84.6 - 96.2 %   Platelets 351 150 - 400 K/uL   nRBC 0.0 0.0 - 0.2 %   Neutrophils Relative % 85 %   Neutro Abs 10.2 (H) 1.7 - 7.7 K/uL   Lymphocytes Relative 6 %   Lymphs Abs 0.7 0.7 - 4.0 K/uL   Monocytes Relative 9 %   Monocytes Absolute 1.0 0.1 - 1.0 K/uL   Eosinophils Relative 0 %   Eosinophils Absolute 0.0 0.0 - 0.5 K/uL   Basophils Relative 0 %   Basophils Absolute 0.0 0.0 - 0.1 K/uL   Immature Granulocytes 0 %   Abs Immature Granulocytes 0.05 0.00 - 0.07 K/uL    Comment: Performed at [facility], [address] [place], [place] [address]   Protime-INR     Status: None   Collection Time: 01/07/23 11:09 AM  Result Value Ref Range   Prothrombin Time 13.3 11.4 - 15.2 seconds   INR 1.0 0.8 - 1.2    Comment: (NOTE) INR goal varies based on device and disease states. Performed at [facility], [address], [place], [place] [address]  APTT     Status: None   Collection Time: 01/07/23 11:09 AM  Result Value Ref Range   aPTT 26 24 - 36 seconds    Comment: Performed at [facility], [address], [place], [place] [address]  TSH     Status: None   Collection Time: 01/07/23 11:09 AM  Result Value Ref Range   TSH 3.674 0.350 - 4.500 uIU/mL    Comment: Performed by a 3rd Generation assay with a functional sensitivity of <=0.01 uIU/mL. Performed at [facility], [address], [place], [place] [address]   CK     Status: None   Collection Time: 01/07/23 11:09 AM  Result Value Ref Range   Total CK 162 38 - 234 U/L    Comment: Performed at [facility], [address] [place], [place] [address]  Urinalysis, w/ Reflex to Culture (Infection Suspected) -Urine, Clean Catch     Status: Abnormal   Collection Time: 01/07/23 12:14 PM  Result Value Ref Range   Specimen Source URINE, CLEAN CATCH    Color, Urine YELLOW YELLOW   APPearance CLEAR CLEAR   Specific Gravity, Urine 1.016 1.005 - 1.030   pH 7.0 5.0 - 8.0   Glucose, UA NEGATIVE NEGATIVE mg/dL   Hgb urine dipstick NEGATIVE NEGATIVE   Bilirubin Urine NEGATIVE NEGATIVE   Ketones, ur 5 (A) NEGATIVE mg/dL   Protein, ur 30 (A) NEGATIVE mg/dL   Nitrite NEGATIVE NEGATIVE   Leukocytes,Ua NEGATIVE NEGATIVE   RBC / HPF 0-5 0 - 5 RBC/hpf   WBC, UA 0-5 0 - 5 WBC/hpf    Comment:        Reflex urine culture not performed if WBC <=10, OR if Squamous epithelial cells >5. If Squamous epithelial cells >5 suggest recollection.    Bacteria, UA NONE SEEN NONE SEEN   Squamous Epithelial / HPF 0-5 0 - 5 /HPF   Mucus PRESENT     Comment: Performed at [facility], [address]  [place], [place] [address]  Lactic acid, plasma     Status: Abnormal   Collection Time: 01/07/23  1:37 PM  Result Value Ref Range   Lactic Acid, Venous 2.6 (HH) 0.5 - 1.9 mmol/L    Comment: CRITICAL RESULT CALLED TO, READ BACK BY AND VERIFIED WITH S.RODGERS ON 01/07/2023 @14 :07 BY T.HAMER Performed at [facility], [address] [place], [place] [address]   Magnesium     Status: None   Collection Time: 01/07/23  2:45 PM  Result Value Ref Range   Magnesium 2.1 1.7 - 2.4 mg/dL    Comment: Performed at [facility], [address] [place], [place] [address]  Lactic acid, plasma     Status: None   Collection Time: 01/07/23  4:34 PM  Result Value Ref Range   Lactic Acid, Venous 1.6 0.5 - 1.9 mmol/L    Comment: Performed at [facility], [address] [place], [place] [address]   CT ABDOMEN PELVIS W CONTRAST Result Date: 01/07/2023 CLINICAL DATA:  Fall at home. Blunt trauma. Generalized abdominal pain. Weakness. EXAM: CT ABDOMEN AND PELVIS WITH CONTRAST TECHNIQUE: Multidetector CT imaging of the abdomen and pelvis was performed using the standard protocol following bolus administration of intravenous contrast. RADIATION DOSE REDUCTION: This exam was performed according to the departmental dose-optimization program which includes automated exposure control, adjustment of the mA and/or kV according to patient size and/or use of iterative reconstruction technique. CONTRAST:  OMNIPAQUE IOHEXOL 300 MG/ML  SOLN COMPARISON:  05/05/2012 FINDINGS:  Lower chest:  Unremarkable. Hepatobiliary: No hepatic laceration or mass identified. Prior cholecystectomy again noted. Increased diffuse biliary ductal dilatation is seen since prior study, however, no obstructing etiology is apparent by CT. Pancreas: No parenchymal laceration, mass, or inflammatory changes identified. No evidence of pancreatic ductal dilatation. Spleen: No evidence of splenic laceration. Adrenal/Urinary Tract: No hemorrhage or parenchymal  lacerations identified. No evidence of suspicious renal masses or hydronephrosis. Unremarkable unopacified urinary bladder. Stomach/Bowel: Unopacified bowel loops are unremarkable in appearance. No evidence of hemoperitoneum. Diverticulosis is seen mainly involving the sigmoid colon, however there is no evidence of diverticulitis. Vascular/Lymphatic: No evidence of abdominal aortic injury. No pathologically enlarged lymph nodes identified. Reproductive:  No mass or other significant abnormality identified. Other:  None. Musculoskeletal: No acute fractures or suspicious bone lesions identified. IMPRESSION: No evidence of traumatic injury within the abdomen or pelvis. Prior cholecystectomy. Increased diffuse biliary ductal dilatation since prior study, however, no obstructing etiology is apparent by CT. Recommend correlation with liver function tests, and consider MRCP for further evaluation. Colonic diverticulosis, without radiographic evidence of diverticulitis. Electronically Signed   By: Danae Orleans M.D.   On: 01/07/2023 17:42   DG Chest Port 1 View Result Date: 01/07/2023 CLINICAL DATA:  77 year old female with possible sepsis. EXAM: PORTABLE CHEST 1 VIEW COMPARISON:  Portable chest 11/05/2019. FINDINGS: Portable AP view at elevn 50 hours. Lung volumes and mediastinal contours remain normal. Visualized tracheal air column is within normal limits. Allowing for portable technique the lungs are clear. No pneumothorax or pleural effusion. Stable cholecystectomy clips. Small chronic rim calcified splenic lesion in the left upper quadrant, stable from a 2014 CT Abdomen and Pelvis negative visible bowel gas. No acute osseous abnormality identified. IMPRESSION: No acute cardiopulmonary abnormality. Electronically Signed   By: Odessa Fleming M.D.   On: 01/07/2023 13:06   CT Head Wo Contrast Result Date: 01/07/2023 CLINICAL DATA:  Patient found on floor. Trauma. Patient is unsure if she lost consciousness. EXAM: CT HEAD  WITHOUT CONTRAST TECHNIQUE: Contiguous axial images were obtained from the base of the skull through the vertex without intravenous contrast. RADIATION DOSE REDUCTION: This exam was performed according to the departmental dose-optimization program which includes automated exposure control, adjustment of the mA and/or kV according to patient size and/or use of iterative reconstruction technique. COMPARISON:  CT head without contrast 07/04/2014. MR head 11/14/2019 FINDINGS: Brain: Moderate generalized atrophy and white matter disease demonstrates continued progression. No acute infarct, hemorrhage, or mass lesion is present. The ventricles are proportionate to the degree of atrophy. No significant extraaxial fluid collection is present. The brainstem and cerebellum are within normal limits. Midline structures are within normal limits. Vascular: Atherosclerotic calcifications are present within the cavernous internal carotid arteries bilaterally. No hyperdense vessel is present. Skull: Calvarium is intact. No focal lytic or blastic lesions are present. No significant extracranial soft tissue lesion is present. Sinuses/Orbits: Posterior right ethmoid air cell is partially opacified. The paranasal sinuses and mastoid air cells are otherwise clear. Bilateral lens replacements are noted. Globes and orbits are otherwise unremarkable. IMPRESSION: 1. No acute intracranial abnormality or significant interval change. 2. Moderate generalized atrophy and white matter disease demonstrates continued progression. This likely reflects the sequela of chronic microvascular ischemia. Electronically Signed   By: Marin Roberts M.D.   On: 01/07/2023 12:55    Pending Labs Unresulted Labs (From admission, onward)     Start     Ordered   01/07/23 1802  Urine Culture  Once,   URGENT  Question:  Indication  Answer:  Dysuria   01/07/23 1802   01/07/23 1056  Blood Culture (routine x 2)  (Undifferentiated presentation  (screening labs and basic nursing orders))  BLOOD CULTURE X 2,   STAT      01/07/23 1055   Signed and Held  Basic metabolic panel  Tomorrow morning,   R        Signed and Held            Vitals/Pain Today's Vitals   01/07/23 1815 01/07/23 1830 01/07/23 1945 01/07/23 2015  BP:  101/78 (!) 129/58 (!) 120/50  Pulse:  78  73  Resp:  18 (!) 24 19  Temp:      TempSrc:      SpO2:  99%  100%  Weight:      Height:      PainSc: 0-No pain       Isolation Precautions No active isolations  Medications Medications  sodium chloride 0.9 % bolus 1,000 mL (0 mLs Intravenous Stopped 01/07/23 1309)  sodium chloride 0.9 % bolus 1,000 mL (0 mLs Intravenous Stopped 01/07/23 1608)  iohexol (OMNIPAQUE) 300 MG/ML solution 100 mL (100 mLs Intravenous Contrast Given 01/07/23 1523)  potassium chloride SA (KLOR-CON M) CR tablet 40 mEq (40 mEq Oral Given 01/07/23 1825)    Mobility walks with person assist     Focused Assessments    R Recommendations: See Admitting Provider Note  Report given to:   Additional Notes:

## 2023-01-07 NOTE — ED Notes (Signed)
Attempted to call report to receiving caregiver unable to get in touch with them

## 2023-01-07 NOTE — H&P (Signed)
History and Physical    Patient: [name]:[id number] DOB: 1945-07-17 DOA: 01/07/2023 DOS: the patient was seen and examined on 01/07/2023 PCP: Anabel Halon, MD  Patient coming from: Home  Chief Complaint:  Chief Complaint  Patient presents with   Fall   HPI: [name] is a 77 y.o. female with medical history significant of diabetes, hypertension, GERD, hypothyroidism, depression, dementia.  Patient presents with weakness over the last 1 week.  No she is able to provide some history, her son provides history as well.  She began calling her family members to tell him that she has been sick with a GI bug since last week.  She had episodes of diarrhea with gas and dyspepsia.  Her family go her Pepto-Bismol and Imodium for the diarrhea, however she did not take much of it due to her dementia.  She normally lives alone and her family members call and check in on her physically.  Due to the diarrhea, her appetite has been mildly decreased.  She has been having some fevers and chills.  She had some mild dysuria, frequency.  Her son states that she does get confused at times due to the dementia  Review of Systems: No chest pains, cough, shortness of breath Past Medical History:  Diagnosis Date   Allergic rhinitis    Arthritis    B12 deficiency    Bipolar disorder (HCC)    Coronary artery disease    Depression    Diabetes mellitus (HCC)    Diabetes mellitus (HCC) 10/05/2021   Essential hypertension    GERD (gastroesophageal reflux disease)    Hashimoto's thyroiditis    History of transient ischemic attack (TIA)    Or possibly migraine as well as right amaurosis fugax and ataxia - Dr. Sandria Manly   Hyperlipidemia    Hypothyroidism    PSVT (paroxysmal supraventricular tachycardia) (HCC)    Right bundle branch block    Past Surgical History:  Procedure Laterality Date   CATARACT EXTRACTION W/PHACO Left 05/23/2020   Procedure: CATARACT EXTRACTION PHACO AND INTRAOCULAR LENS  PLACEMENT LEFT EYE;  Surgeon: Fabio Pierce, MD;  Location: [facility];  Service: Ophthalmology;  Laterality: Left;  left CDE=15.69   CATARACT EXTRACTION W/PHACO Right 06/13/2020   Procedure: CATARACT EXTRACTION PHACO AND INTRAOCULAR LENS PLACEMENT RIGHT EYE;  Surgeon: Fabio Pierce, MD;  Location: [facility];  Service: Ophthalmology;  Laterality: Right;  right CDE=11.33   COLONOSCOPY WITH PROPOFOL N/A 04/22/2022   Procedure: COLONOSCOPY WITH PROPOFOL;  Surgeon: Corbin Ade, MD;  Location: [facility];  Service: Endoscopy;  Laterality: N/A;  8:30 am, asa 3   LAPAROSCOPIC CHOLECYSTECTOMY  2010   POLYPECTOMY  04/22/2022   Procedure: POLYPECTOMY INTESTINAL;  Surgeon: Corbin Ade, MD;  Location: [facility];  Service: Endoscopy;;   THYROIDECTOMY  2009   TOTAL KNEE ARTHROPLASTY Left 02/16/2021   Procedure: TOTAL KNEE ARTHROPLASTY;  Surgeon: Ollen Gross, MD;  Location: [facility];  Service: Orthopedics;  Laterality: Left;   Social History:  reports that she quit smoking about 39 years ago. Her smoking use included cigarettes. She started smoking about 49 years ago. She has a 5 pack-year smoking history. She has never used smokeless tobacco. She reports that she does not drink alcohol and does not use drugs.  Allergies  Allergen Reactions   Amoxicillin-Pot Clavulanate Rash   Atenolol Rash   Penicillins Rash    Tolerated Cephalosporin Date: 02/17/21.     Sulfonamide Derivatives Rash  Family History  Problem Relation Age of Onset   Stroke Mother    Thyroid disease Mother    Depression Father    Hypertension Sister    Aneurysm Sister    Diabetes Brother    Heart attack Brother    Hypertension Brother    Heart failure Brother    Thyroid disease Brother    Stroke Maternal Grandmother    Heart failure Maternal Grandmother    Heart failure Maternal Grandfather    Stroke Maternal Grandfather    Heart failure Paternal Grandmother    Cancer - Colon Neg Hx     Prior to Admission  medications   Medication Sig Start Date End Date Taking? Authorizing Provider  acetaminophen (TYLENOL) 500 MG tablet Take 1,000 mg by mouth every 6 (six) hours as needed for moderate pain or headache.    [provider]  albuterol (VENTOLIN HFA) 108 (90 Base) MCG/ACT inhaler Inhale 2 puffs into the lungs every 6 (six) hours as needed for wheezing or shortness of breath. 09/02/20   Heather Roberts, NP  alendronate (FOSAMAX) 70 MG tablet TAKE 1 TABLET EVERY WEEK IN THE MORNING 30 MIN BEFORE EATING WITH AN 8OZ GLASS OF WATER (SIT UP 30 MIN) 02/01/22   Anabel Halon, MD  atorvastatin (LIPITOR) 80 MG tablet Take 1 tablet (80 mg total) by mouth daily. 10/06/22   Anabel Halon, MD  blood glucose meter kit and supplies KIT Dispense based on patient and insurance preference. Use up to four times daily as directed. 02/01/22   Anabel Halon, MD  buPROPion (WELLBUTRIN XL) 300 MG 24 hr tablet Take 300 mg by mouth daily.  02/08/14   [provider]  clonazePAM (KLONOPIN) 0.5 MG tablet Take 0.25-0.5 mg by mouth in the morning, at noon, and at bedtime.      DEXILANT 60 MG capsule Take 1 capsule by mouth daily.    [provider]  diltiazem (CARDIZEM CD) 240 MG 24 hr capsule TAKE (1) CAPSULE BY MOUTH ONCE DAILY. 08/26/22   Jonelle Sidle, MD  donepezil (ARICEPT) 5 MG tablet TAKE (1) TABLET BY MOUTH AT BEDTIME. 12/07/22   Anabel Halon, MD  DULoxetine (CYMBALTA) 20 MG capsule Take 20 mg by mouth 2 (two) times daily. 12/07/22   [provider]  DULoxetine HCl 20 MG CSDR Take 20 mg by mouth 2 (two) times daily.    [provider]  ferrous sulfate 325 (65 FE) MG tablet Take 325 mg by mouth daily with breakfast.    [provider]  fluconazole (DIFLUCAN) 150 MG tablet Take 150 mg by mouth once. 12/03/22   [provider]  Lancets (ONETOUCH DELICA PLUS LANCET33G) MISC USE TO TEST BLOOD SUGAR TWICE DAILY. 07/31/21   Anabel Halon, MD  levothyroxine (SYNTHROID)  125 MCG tablet Take 1 tablet (125 mcg total) by mouth daily before breakfast. 12/27/22   Dani Gobble, NP  losartan (COZAAR) 50 MG tablet Take 1 tablet (50 mg total) by mouth 2 (two) times daily. 10/06/22   Patel, Earlie Lou, MD  LYBALVI 5-10 MG TABS Take 1 tablet by mouth at bedtime. 03/10/20   [provider]  metFORMIN (GLUCOPHAGE) 500 MG tablet Take 1 tablet (500 mg total) by mouth 2 (two) times daily with a meal. 10/06/22   Anabel Halon, MD  mirabegron ER (MYRBETRIQ) 25 MG TB24 tablet Take 1 tablet (25 mg total) by mouth daily. 10/06/22   Anabel Halon, MD  Multiple Vitamins-Minerals (ICAPS AREDS 2 PO) Take 1 capsule by mouth 2 (two) times daily.    [provider]  mupirocin ointment (BACTROBAN) 2 % Apply 1 Application topically. 09/09/22   [provider]  Na Sulfate-K Sulfate-Mg Sulf 17.5-3.13-1.6 GM/177ML SOLN As directed 03/17/22   Rourk, Gerrit Friends, MD  ondansetron (ZOFRAN) 4 MG tablet Take 1 tablet (4 mg total) by mouth every 8 (eight) hours as needed for nausea or vomiting. 06/11/21   Anabel Halon, MD  [facility] ULTRA TEST test strip USE TO TEST BLOOD SUGAR TWICE DAILY. 10/25/22   Anabel Halon, MD  pantoprazole (PROTONIX) 40 MG tablet TAKE 1 TABLET DAILY (LUNCH). 12/20/22   Anabel Halon, MD  topiramate (TOPAMAX) 50 MG tablet Take 50 mg by mouth at bedtime. 12/09/22   [provider]    Physical Exam: Vitals:   01/07/23 1457 01/07/23 1515 01/07/23 1630 01/07/23 1830  BP:  114/61 124/65 101/78  Pulse:  81 96 78  Resp:  19 14 18   Temp: 98.2 F (36.8 C)     TempSrc: Oral     SpO2:  97% 98% 99%  Weight:      Height:       General: Elderly female. Awake and alert and oriented to person and place.  Stated year as 79. No acute cardiopulmonary distress.  HEENT: Normocephalic atraumatic.  Right and left ears normal in appearance.  Pupils equal, round, reactive to light. Extraocular muscles are intact. Sclerae anicteric and noninjected.  Moist  mucosal membranes. No mucosal lesions.  Neck: Neck supple without lymphadenopathy. No carotid bruits. No masses palpated.  Cardiovascular: Regular rate with normal S1-S2 sounds. No murmurs, rubs, gallops auscultated. No JVD.  Respiratory: Good respiratory effort with no wheezes, rales, rhonchi. Lungs clear to auscultation bilaterally.  No accessory muscle use. Abdomen: Soft, nontender, nondistended. Active bowel sounds. No masses or hepatosplenomegaly  Skin: No rashes, lesions, or ulcerations.  Dry, warm to touch. 2+ dorsalis pedis and radial pulses. Musculoskeletal: No calf or leg pain. All major joints not erythematous nontender.  No upper or lower joint deformation.  Good ROM.  No contractures  Psychiatric: Intact judgment and insight. Pleasant and cooperative. Neurologic: No focal neurological deficits. Strength is 5/5 and symmetric in upper and lower extremities.  Cranial nerves II through XII are grossly intact.  Data Reviewed: Results for orders placed or performed during the hospital encounter of 01/07/23 (from the past 24 hours)  Lactic acid, plasma     Status: None   Collection Time: 01/07/23 11:09 AM  Result Value Ref Range   Lactic Acid, Venous 1.3 0.5 - 1.9 mmol/L  Comprehensive metabolic panel     Status: Abnormal   Collection Time: 01/07/23 11:09 AM  Result Value Ref Range   Sodium 128 (L) 135 - 145 mmol/L   Potassium 3.2 (L) 3.5 - 5.1 mmol/L   Chloride 95 (L) 98 - 111 mmol/L   CO2 23 22 - 32 mmol/L   Glucose, Bld 122 (H) 70 - 99 mg/dL   BUN 7 (L) 8 - 23 mg/dL   Creatinine, Ser 1.61 0.44 - 1.00 mg/dL   Calcium 8.9 8.9 - 09.6 mg/dL   Total Protein 6.5 6.5 - 8.1 g/dL   Albumin 3.6 3.5 - 5.0 g/dL   AST 39 15 - 41 U/L   ALT 66 (H) 0 - 44 U/L   Alkaline Phosphatase 142 (H) 38 - 126 U/L   Total Bilirubin 0.8 <1.2 mg/dL   GFR,  Estimated >60 >60 mL/min   Anion gap 10 5 - 15  CBC with Differential     Status: Abnormal   Collection Time: 01/07/23 11:09 AM  Result Value Ref  Range   WBC 12.0 (H) 4.0 - 10.5 K/uL   RBC 4.12 3.87 - 5.11 MIL/uL   Hemoglobin 13.5 12.0 - 15.0 g/dL   HCT 78.2 95.6 - 21.3 %   MCV 92.5 80.0 - 100.0 fL   MCH 32.8 26.0 - 34.0 pg   MCHC 35.4 30.0 - 36.0 g/dL   RDW 08.6 57.8 - 46.9 %   Platelets 351 150 - 400 K/uL   nRBC 0.0 0.0 - 0.2 %   Neutrophils Relative % 85 %   Neutro Abs 10.2 (H) 1.7 - 7.7 K/uL   Lymphocytes Relative 6 %   Lymphs Abs 0.7 0.7 - 4.0 K/uL   Monocytes Relative 9 %   Monocytes Absolute 1.0 0.1 - 1.0 K/uL   Eosinophils Relative 0 %   Eosinophils Absolute 0.0 0.0 - 0.5 K/uL   Basophils Relative 0 %   Basophils Absolute 0.0 0.0 - 0.1 K/uL   Immature Granulocytes 0 %   Abs Immature Granulocytes 0.05 0.00 - 0.07 K/uL  Protime-INR     Status: None   Collection Time: 01/07/23 11:09 AM  Result Value Ref Range   Prothrombin Time 13.3 11.4 - 15.2 seconds   INR 1.0 0.8 - 1.2  APTT     Status: None   Collection Time: 01/07/23 11:09 AM  Result Value Ref Range   aPTT 26 24 - 36 seconds  TSH     Status: None   Collection Time: 01/07/23 11:09 AM  Result Value Ref Range   TSH 3.674 0.350 - 4.500 uIU/mL  CK     Status: None   Collection Time: 01/07/23 11:09 AM  Result Value Ref Range   Total CK 162 38 - 234 U/L  Urinalysis, w/ Reflex to Culture (Infection Suspected) -Urine, Clean Catch     Status: Abnormal   Collection Time: 01/07/23 12:14 PM  Result Value Ref Range   Specimen Source URINE, CLEAN CATCH    Color, Urine YELLOW YELLOW   APPearance CLEAR CLEAR   Specific Gravity, Urine 1.016 1.005 - 1.030   pH 7.0 5.0 - 8.0   Glucose, UA NEGATIVE NEGATIVE mg/dL   Hgb urine dipstick NEGATIVE NEGATIVE   Bilirubin Urine NEGATIVE NEGATIVE   Ketones, ur 5 (A) NEGATIVE mg/dL   Protein, ur 30 (A) NEGATIVE mg/dL   Nitrite NEGATIVE NEGATIVE   Leukocytes,Ua NEGATIVE NEGATIVE   RBC / HPF 0-5 0 - 5 RBC/hpf   WBC, UA 0-5 0 - 5 WBC/hpf   Bacteria, UA NONE SEEN NONE SEEN   Squamous Epithelial / HPF 0-5 0 - 5 /HPF   Mucus  PRESENT   Lactic acid, plasma     Status: Abnormal   Collection Time: 01/07/23  1:37 PM  Result Value Ref Range   Lactic Acid, Venous 2.6 (HH) 0.5 - 1.9 mmol/L  Magnesium     Status: None   Collection Time: 01/07/23  2:45 PM  Result Value Ref Range   Magnesium 2.1 1.7 - 2.4 mg/dL  Lactic acid, plasma     Status: None   Collection Time: 01/07/23  4:34 PM  Result Value Ref Range   Lactic Acid, Venous 1.6 0.5 - 1.9 mmol/L    CT ABDOMEN PELVIS W CONTRAST Result Date: 01/07/2023 CLINICAL DATA:  Fall at home. Blunt trauma. Generalized  abdominal pain. Weakness. EXAM: CT ABDOMEN AND PELVIS WITH CONTRAST TECHNIQUE: Multidetector CT imaging of the abdomen and pelvis was performed using the standard protocol following bolus administration of intravenous contrast. RADIATION DOSE REDUCTION: This exam was performed according to the departmental dose-optimization program which includes automated exposure control, adjustment of the mA and/or kV according to patient size and/or use of iterative reconstruction technique. CONTRAST:  OMNIPAQUE IOHEXOL 300 MG/ML  SOLN COMPARISON:  05/05/2012 FINDINGS: Lower chest:  Unremarkable. Hepatobiliary: No hepatic laceration or mass identified. Prior cholecystectomy again noted. Increased diffuse biliary ductal dilatation is seen since prior study, however, no obstructing etiology is apparent by CT. Pancreas: No parenchymal laceration, mass, or inflammatory changes identified. No evidence of pancreatic ductal dilatation. Spleen: No evidence of splenic laceration. Adrenal/Urinary Tract: No hemorrhage or parenchymal lacerations identified. No evidence of suspicious renal masses or hydronephrosis. Unremarkable unopacified urinary bladder. Stomach/Bowel: Unopacified bowel loops are unremarkable in appearance. No evidence of hemoperitoneum. Diverticulosis is seen mainly involving the sigmoid colon, however there is no evidence of diverticulitis. Vascular/Lymphatic: No evidence  of abdominal aortic injury. No pathologically enlarged lymph nodes identified. Reproductive:  No mass or other significant abnormality identified. Other:  None. Musculoskeletal: No acute fractures or suspicious bone lesions identified. IMPRESSION: No evidence of traumatic injury within the abdomen or pelvis. Prior cholecystectomy. Increased diffuse biliary ductal dilatation since prior study, however, no obstructing etiology is apparent by CT. Recommend correlation with liver function tests, and consider MRCP for further evaluation. Colonic diverticulosis, without radiographic evidence of diverticulitis. Electronically Signed   By: Danae Orleans M.D.   On: 01/07/2023 17:42   DG Chest Port 1 View Result Date: 01/07/2023 CLINICAL DATA:  77 year old female with possible sepsis. EXAM: PORTABLE CHEST 1 VIEW COMPARISON:  Portable chest 11/05/2019. FINDINGS: Portable AP view at elevn 50 hours. Lung volumes and mediastinal contours remain normal. Visualized tracheal air column is within normal limits. Allowing for portable technique the lungs are clear. No pneumothorax or pleural effusion. Stable cholecystectomy clips. Small chronic rim calcified splenic lesion in the left upper quadrant, stable from a 2014 CT Abdomen and Pelvis negative visible bowel gas. No acute osseous abnormality identified. IMPRESSION: No acute cardiopulmonary abnormality. Electronically Signed   By: Odessa Fleming M.D.   On: 01/07/2023 13:06   CT Head Wo Contrast Result Date: 01/07/2023 CLINICAL DATA:  Patient found on floor. Trauma. Patient is unsure if she lost consciousness. EXAM: CT HEAD WITHOUT CONTRAST TECHNIQUE: Contiguous axial images were obtained from the base of the skull through the vertex without intravenous contrast. RADIATION DOSE REDUCTION: This exam was performed according to the departmental dose-optimization program which includes automated exposure control, adjustment of the mA and/or kV according to patient size and/or use of  iterative reconstruction technique. COMPARISON:  CT head without contrast 07/04/2014. MR head 11/14/2019 FINDINGS: Brain: Moderate generalized atrophy and white matter disease demonstrates continued progression. No acute infarct, hemorrhage, or mass lesion is present. The ventricles are proportionate to the degree of atrophy. No significant extraaxial fluid collection is present. The brainstem and cerebellum are within normal limits. Midline structures are within normal limits. Vascular: Atherosclerotic calcifications are present within the cavernous internal carotid arteries bilaterally. No hyperdense vessel is present. Skull: Calvarium is intact. No focal lytic or blastic lesions are present. No significant extracranial soft tissue lesion is present. Sinuses/Orbits: Posterior right ethmoid air cell is partially opacified. The paranasal sinuses and mastoid air cells are otherwise clear. Bilateral lens replacements are noted. Globes and orbits are otherwise unremarkable.  IMPRESSION: 1. No acute intracranial abnormality or significant interval change. 2. Moderate generalized atrophy and white matter disease demonstrates continued progression. This likely reflects the sequela of chronic microvascular ischemia. Electronically Signed   By: Marin Roberts M.D.   On: 01/07/2023 12:55     Assessment and Plan: No notes have been filed under this hospital service. Service: Hospitalist  Principal Problem:   Hyponatremia Active Problems:   Essential hypertension   Gastroesophageal reflux disease   Diabetes mellitus (HCC)   Dementia   OSA (obstructive sleep apnea)   Hypokalemia  Hyponatremia Likely dehydration due to GI losses.  Will start gentle IV fluids and recheck sodium in the morning. Diarrhea is currently subsided. Hypokalemia Will replace potassium.  Patient did get p.o. dose.  Will put potassium and IV fluids. Recheck potassium in the morning Encephalopathy Possibly secondary to  dehydration and hyponatremia Will watch for clearing with rehydration and correction of electrolyte abnormalities UA appears to be normal.  Urine culture sent.  If she spikes a fever, I would treat for UTI Dementia Continue Aricept Diabetes Continue metformin CBGs AC nightly GERD Hypertension   Advance Care Planning:   Code Status: Full Code confirmed by patient and son  Consults: None  Family Communication: Son present during interview and exam  Severity of Illness: The appropriate patient status for this patient is INPATIENT. Inpatient status is judged to be reasonable and necessary in order to provide the required intensity of service to ensure the patient's safety. The patient's presenting symptoms, physical exam findings, and initial radiographic and laboratory data in the context of their chronic comorbidities is felt to place them at high risk for further clinical deterioration. Furthermore, it is not anticipated that the patient will be medically stable for discharge from the hospital within 2 midnights of admission.   * I certify that at the point of admission it is my clinical judgment that the patient will require inpatient hospital care spanning beyond 2 midnights from the point of admission due to high intensity of service, high risk for further deterioration and high frequency of surveillance required.*  Author: Levie Heritage, DO 01/07/2023 7:05 PM  For on call review [email or web address].

## 2023-01-08 DIAGNOSIS — E871 Hypo-osmolality and hyponatremia: Secondary | ICD-10-CM | POA: Diagnosis not present

## 2023-01-08 LAB — BASIC METABOLIC PANEL
Anion gap: 9 (ref 5–15)
BUN: <5 mg/dL — ABNORMAL LOW (ref 8–23)
CO2: 18 mmol/L — ABNORMAL LOW (ref 22–32)
Calcium: 7.9 mg/dL — ABNORMAL LOW (ref 8.9–10.3)
Chloride: 103 mmol/L (ref 98–111)
Creatinine, Ser: 0.42 mg/dL — ABNORMAL LOW (ref 0.44–1.00)
GFR, Estimated: >60 mL/min (ref >60)
Glucose, Bld: 86 mg/dL (ref 70–99)
Potassium: 3.6 mmol/L (ref 3.5–5.1)
Sodium: 130 mmol/L — ABNORMAL LOW (ref 135–145)

## 2023-01-08 LAB — GLUCOSE, CAPILLARY
Glucose-Capillary: 142 mg/dL — ABNORMAL HIGH (ref 70–99)
Glucose-Capillary: 167 mg/dL — ABNORMAL HIGH (ref 70–99)
Glucose-Capillary: 90 mg/dL (ref 70–99)
Glucose-Capillary: 92 mg/dL (ref 70–99)

## 2023-01-08 MED ORDER — POLYETHYLENE GLYCOL 3350 17 G PO PACK
17.0000 g | PACK | Freq: Every day | ORAL | Status: DC | PRN
  Administered 2023-01-08: 17 g via ORAL
  Filled 2023-01-08: qty 1

## 2023-01-08 MED ORDER — POTASSIUM CHLORIDE IN NACL 40-0.9 MEQ/L-% IV SOLN
INTRAVENOUS | Status: AC
  Filled 2023-01-08 (×2): qty 1000

## 2023-01-08 MED ORDER — METFORMIN HCL 500 MG PO TABS: 500.0000 mg | ORAL_TABLET | Freq: Two times a day (BID) | ORAL | Status: DC

## 2023-01-08 MED ORDER — SODIUM BICARBONATE 650 MG PO TABS
650.0000 mg | ORAL_TABLET | Freq: Three times a day (TID) | ORAL | Status: DC
  Administered 2023-01-08 (×3): 650 mg via ORAL
  Filled 2023-01-08 (×3): qty 1

## 2023-01-08 MED ORDER — ONDANSETRON HCL 4 MG/2ML IJ SOLN
4.0000 mg | Freq: Four times a day (QID) | INTRAMUSCULAR | Status: DC | PRN
  Administered 2023-01-09 – 2023-01-10 (×2): 4 mg via INTRAVENOUS
  Filled 2023-01-08 (×2): qty 2

## 2023-01-08 MED ORDER — BUPROPION HCL ER (XL) 150 MG PO TB24
300.0000 mg | ORAL_TABLET | Freq: Every day | ORAL | Status: DC
  Administered 2023-01-08 – 2023-01-11 (×4): 300 mg via ORAL
  Filled 2023-01-08 (×5): qty 2

## 2023-01-08 MED ORDER — MIRABEGRON ER 25 MG PO TB24
25.0000 mg | ORAL_TABLET | Freq: Every day | ORAL | Status: DC
  Administered 2023-01-08 – 2023-01-11 (×4): 25 mg via ORAL
  Filled 2023-01-08 (×4): qty 1

## 2023-01-08 MED ORDER — DILTIAZEM HCL ER COATED BEADS 120 MG PO CP24
240.0000 mg | ORAL_CAPSULE | Freq: Every day | ORAL | Status: DC
  Administered 2023-01-08 – 2023-01-11 (×4): 240 mg via ORAL
  Filled 2023-01-08 (×4): qty 2

## 2023-01-08 MED ORDER — INSULIN ASPART 100 UNIT/ML IJ SOLN
0.0000 [IU] | Freq: Every day | INTRAMUSCULAR | Status: DC
  Administered 2023-01-09: 2 [IU] via SUBCUTANEOUS

## 2023-01-08 MED ORDER — DULOXETINE HCL 20 MG PO CPEP
20.0000 mg | ORAL_CAPSULE | Freq: Two times a day (BID) | ORAL | Status: DC
  Administered 2023-01-08 – 2023-01-11 (×7): 20 mg via ORAL
  Filled 2023-01-08 (×8): qty 1

## 2023-01-08 MED ORDER — ENOXAPARIN SODIUM 40 MG/0.4ML IJ SOSY
40.0000 mg | PREFILLED_SYRINGE | INTRAMUSCULAR | Status: DC
  Administered 2023-01-08 – 2023-01-09 (×2): 40 mg via SUBCUTANEOUS
  Filled 2023-01-08 (×2): qty 0.4

## 2023-01-08 MED ORDER — ONDANSETRON HCL 4 MG PO TABS: 4.0000 mg | ORAL_TABLET | Freq: Four times a day (QID) | ORAL | Status: DC | PRN

## 2023-01-08 MED ORDER — INSULIN ASPART 100 UNIT/ML IJ SOLN
0.0000 [IU] | Freq: Three times a day (TID) | INTRAMUSCULAR | Status: DC
  Administered 2023-01-08: 2 [IU] via SUBCUTANEOUS
  Administered 2023-01-08 – 2023-01-10 (×2): 3 [IU] via SUBCUTANEOUS
  Administered 2023-01-10 – 2023-01-11 (×3): 2 [IU] via SUBCUTANEOUS
  Administered 2023-01-11: 3 [IU] via SUBCUTANEOUS

## 2023-01-08 MED ORDER — LEVOTHYROXINE SODIUM 25 MCG PO TABS
125.0000 ug | ORAL_TABLET | Freq: Every day | ORAL | Status: DC
  Administered 2023-01-08 – 2023-01-11 (×4): 125 ug via ORAL
  Filled 2023-01-08 (×4): qty 1

## 2023-01-08 MED ORDER — ATORVASTATIN CALCIUM 80 MG PO TABS
80.0000 mg | ORAL_TABLET | Freq: Every day | ORAL | Status: DC
  Administered 2023-01-08 – 2023-01-11 (×4): 80 mg via ORAL
  Filled 2023-01-08 (×4): qty 1

## 2023-01-08 MED ORDER — DONEPEZIL HCL 5 MG PO TABS
5.0000 mg | ORAL_TABLET | Freq: Every day | ORAL | Status: DC
  Administered 2023-01-08 – 2023-01-10 (×4): 5 mg via ORAL
  Filled 2023-01-08 (×4): qty 1

## 2023-01-08 MED ORDER — PANTOPRAZOLE SODIUM 40 MG PO TBEC
40.0000 mg | DELAYED_RELEASE_TABLET | Freq: Every day | ORAL | Status: DC
  Administered 2023-01-08 – 2023-01-11 (×4): 40 mg via ORAL
  Filled 2023-01-08 (×4): qty 1

## 2023-01-08 MED ORDER — POTASSIUM CHLORIDE CRYS ER 20 MEQ PO TBCR
40.0000 meq | EXTENDED_RELEASE_TABLET | Freq: Once | ORAL | Status: AC
  Administered 2023-01-08: 40 meq via ORAL
  Filled 2023-01-08: qty 2

## 2023-01-08 MED ORDER — ENSURE ENLIVE PO LIQD
237.0000 mL | Freq: Two times a day (BID) | ORAL | Status: DC
  Administered 2023-01-08 – 2023-01-10 (×6): 237 mL via ORAL

## 2023-01-08 NOTE — Plan of Care (Signed)

## 2023-01-08 NOTE — Plan of Care (Signed)

## 2023-01-08 NOTE — Progress Notes (Signed)
PROGRESS NOTE    [name]  [id number]:[id number] DOB: [birth year] DOA: 01/07/2023 PCP: Anabel Halon, MD   Chief Complaint  Patient presents with   Fall    Brief Narrative:     [name] is a 77 y.o. female with medical history significant of diabetes, hypertension, GERD, hypothyroidism, depression, dementia.  Patient presents with weakness over the last 1 week.  No she is able to provide some history, her son provides history as well.  She began calling her family members to tell him that she has been sick with a GI bug since last week.  She had episodes of diarrhea with gas and dyspepsia.  Her family go her Pepto-Bismol and Imodium for the diarrhea, however she did not take much of it due to her dementia.  She normally lives alone and her family members call and check in on her physically.  Due to the diarrhea, her appetite has been mildly decreased.  She has been having some fevers and chills.  She had some mild dysuria, frequency.  Her son states that she does get confused at times due to the dementia   Assessment & Plan:   Principal Problem:   Hyponatremia Active Problems:   Essential hypertension   Gastroesophageal reflux disease   Diabetes mellitus (HCC)   Dementia   OSA (obstructive sleep apnea)   Hypokalemia  Dehydration  Hyponatremia -Clinically dehydrated, -Continue with IV fluids, sodium is improving  Hypokalemia -Replaced -Monitor and replace as needed  Acute  metabolic encephalopathy - Possibly secondary to dehydration and hyponatremia - UA appears to be normal.  - improving as discussed with son at bedside as well she is with underlying dementia  Dementia - Continue Aricept  Diabetes -Hold metformin and continue with insulin sliding scale  Lactic acidosis Low bicarb  -On bicarb supplement, hold metformin and continue with IV fluids  GERD -Continue with PPI  Hypothyroidism -Continue with Synthroid  Hypertension -Continue with home  meds    DVT prophylaxis: Lovenox Code Status: Full code Family Communication: Cussed with son at bedside Disposition:   Status is: Inpatient    Consultants:  none   Subjective:  Significant events overnight as discussed with staff, patient reports appetite has improved, no nausea, no vomiting, diarrhea has improved as well.  Objective: Vitals:   01/08/23 0303 01/08/23 0800 01/08/23 1100 01/08/23 1201  BP: 139/66 (!) 144/65 135/61 127/65  Pulse: 74 60 60 66  Resp: (!) 23 15 19 19   Temp: 98 F (36.7 C) 97.8 F (36.6 C) 97.8 F (36.6 C) 98 F (36.7 C)  TempSrc: Oral Oral Oral Oral  SpO2: 95% 99% 99% 99%  Weight:      Height:        Intake/Output Summary (Last 24 hours) at 01/08/2023 1311 Last data filed at 01/07/2023 1608 Gross per 24 hour  Intake 1000 ml  Output --  Net 1000 ml   Filed Weights   01/07/23 1039  Weight: 86.2 kg    Examination:  Awake Alert, Oriented X 2, frail, pleasant Symmetrical Chest wall movement, Good air movement bilaterally, CTAB RRR,No Gallops,Rubs or new Murmurs, No Parasternal Heave +ve B.Sounds, Abd Soft, No tenderness, No rebound - guarding or rigidity. No Cyanosis, Clubbing or edema, No new Rash or bruise      Data Reviewed: I have personally reviewed following labs and imaging studies  CBC: Recent Labs  Lab 01/07/23 1109  WBC 12.0*  NEUTROABS 10.2*  HGB 13.5  HCT 38.1  MCV 92.5  PLT 351    Basic Metabolic Panel: Recent Labs  Lab 01/07/23 1109 01/07/23 1445 01/08/23 0457  NA 128*  --  130*  K 3.2*  --  3.6  CL 95*  --  103  CO2 23  --  18*  GLUCOSE 122*  --  86  BUN 7*  --  <5*  CREATININE 0.57  --  0.42*  CALCIUM 8.9  --  7.9*  MG  --  2.1  --     GFR: Estimated Creatinine Clearance: 60.9 mL/min (A) (by C-G formula based on SCr of 0.42 mg/dL (L)).  Liver Function Tests: Recent Labs  Lab 01/07/23 1109  AST 39  ALT 66*  ALKPHOS 142*  BILITOT 0.8  PROT 6.5  ALBUMIN 3.6    CBG: Recent  Labs  Lab 01/08/23 0019 01/08/23 0805 01/08/23 1207  GLUCAP 90 92 167*     Recent Results (from the past 240 hours)  Blood Culture (routine x 2)     Status: None (Preliminary result)   Collection Time: 01/07/23 11:09 AM   Specimen: BLOOD  Result Value Ref Range Status   Specimen Description BLOOD BLOOD RIGHT ARM  Final   Special Requests   Final    Blood Culture results may not be optimal due to an inadequate volume of blood received in culture bottles RAC   Culture   Final    NO GROWTH < 24 HOURS Performed at [facility], [address], [place], [place] [address]    Report Status PENDING  Incomplete  Blood Culture (routine x 2)     Status: None (Preliminary result)   Collection Time: 01/07/23 11:32 AM   Specimen: BLOOD  Result Value Ref Range Status   Specimen Description BLOOD BLOOD RIGHT HAND  Final   Special Requests   Final    BOTTLES DRAWN AEROBIC ONLY Blood Culture results may not be optimal due to an inadequate volume of blood received in culture bottles   Culture   Final    NO GROWTH < 24 HOURS Performed at [facility], [address] [place], [place] [address]    Report Status PENDING  Incomplete         Radiology Studies: CT ABDOMEN PELVIS W CONTRAST Result Date: 01/07/2023 CLINICAL DATA:  Fall at home. Blunt trauma. Generalized abdominal pain. Weakness. EXAM: CT ABDOMEN AND PELVIS WITH CONTRAST TECHNIQUE: Multidetector CT imaging of the abdomen and pelvis was performed using the standard protocol following bolus administration of intravenous contrast. RADIATION DOSE REDUCTION: This exam was performed according to the departmental dose-optimization program which includes automated exposure control, adjustment of the mA and/or kV according to patient size and/or use of iterative reconstruction technique. CONTRAST:  OMNIPAQUE IOHEXOL 300 MG/ML  SOLN COMPARISON:  05/05/2012 FINDINGS: Lower chest:  Unremarkable. Hepatobiliary: No hepatic laceration or mass  identified. Prior cholecystectomy again noted. Increased diffuse biliary ductal dilatation is seen since prior study, however, no obstructing etiology is apparent by CT. Pancreas: No parenchymal laceration, mass, or inflammatory changes identified. No evidence of pancreatic ductal dilatation. Spleen: No evidence of splenic laceration. Adrenal/Urinary Tract: No hemorrhage or parenchymal lacerations identified. No evidence of suspicious renal masses or hydronephrosis. Unremarkable unopacified urinary bladder. Stomach/Bowel: Unopacified bowel loops are unremarkable in appearance. No evidence of hemoperitoneum. Diverticulosis is seen mainly involving the sigmoid colon, however there is no evidence of diverticulitis. Vascular/Lymphatic: No evidence of abdominal aortic injury. No pathologically enlarged lymph nodes identified. Reproductive:  No mass or other  significant abnormality identified. Other:  None. Musculoskeletal: No acute fractures or suspicious bone lesions identified. IMPRESSION: No evidence of traumatic injury within the abdomen or pelvis. Prior cholecystectomy. Increased diffuse biliary ductal dilatation since prior study, however, no obstructing etiology is apparent by CT. Recommend correlation with liver function tests, and consider MRCP for further evaluation. Colonic diverticulosis, without radiographic evidence of diverticulitis. Electronically Signed   By: Danae Orleans M.D.   On: 01/07/2023 17:42   DG Chest Port 1 View Result Date: 01/07/2023 CLINICAL DATA:  77 year old female with possible sepsis. EXAM: PORTABLE CHEST 1 VIEW COMPARISON:  Portable chest 11/05/2019. FINDINGS: Portable AP view at elevn 50 hours. Lung volumes and mediastinal contours remain normal. Visualized tracheal air column is within normal limits. Allowing for portable technique the lungs are clear. No pneumothorax or pleural effusion. Stable cholecystectomy clips. Small chronic rim calcified splenic lesion in the left upper  quadrant, stable from a 2014 CT Abdomen and Pelvis negative visible bowel gas. No acute osseous abnormality identified. IMPRESSION: No acute cardiopulmonary abnormality. Electronically Signed   By: Odessa Fleming M.D.   On: 01/07/2023 13:06   CT Head Wo Contrast Result Date: 01/07/2023 CLINICAL DATA:  Patient found on floor. Trauma. Patient is unsure if she lost consciousness. EXAM: CT HEAD WITHOUT CONTRAST TECHNIQUE: Contiguous axial images were obtained from the base of the skull through the vertex without intravenous contrast. RADIATION DOSE REDUCTION: This exam was performed according to the departmental dose-optimization program which includes automated exposure control, adjustment of the mA and/or kV according to patient size and/or use of iterative reconstruction technique. COMPARISON:  CT head without contrast 07/04/2014. MR head 11/14/2019 FINDINGS: Brain: Moderate generalized atrophy and white matter disease demonstrates continued progression. No acute infarct, hemorrhage, or mass lesion is present. The ventricles are proportionate to the degree of atrophy. No significant extraaxial fluid collection is present. The brainstem and cerebellum are within normal limits. Midline structures are within normal limits. Vascular: Atherosclerotic calcifications are present within the cavernous internal carotid arteries bilaterally. No hyperdense vessel is present. Skull: Calvarium is intact. No focal lytic or blastic lesions are present. No significant extracranial soft tissue lesion is present. Sinuses/Orbits: Posterior right ethmoid air cell is partially opacified. The paranasal sinuses and mastoid air cells are otherwise clear. Bilateral lens replacements are noted. Globes and orbits are otherwise unremarkable. IMPRESSION: 1. No acute intracranial abnormality or significant interval change. 2. Moderate generalized atrophy and white matter disease demonstrates continued progression. This likely reflects the sequela of  chronic microvascular ischemia. Electronically Signed   By: Marin Roberts M.D.   On: 01/07/2023 12:55        Scheduled Meds:  atorvastatin  80 mg Oral Daily   buPROPion  300 mg Oral Daily   diltiazem  240 mg Oral Daily   donepezil  5 mg Oral QHS   DULoxetine  20 mg Oral BID   enoxaparin (LOVENOX) injection  40 mg Subcutaneous Q24H   feeding supplement  237 mL Oral BID BM   insulin aspart  0-15 Units Subcutaneous TID WC   insulin aspart  0-5 Units Subcutaneous QHS   levothyroxine  125 mcg Oral QAC breakfast   mirabegron ER  25 mg Oral Daily   pantoprazole  40 mg Oral Daily   sodium bicarbonate  650 mg Oral TID   Continuous Infusions:  0.9 % NaCl with KCl 40 mEq / L 75 mL/hr at 01/08/23 0044     LOS: 1 day      [facility]  Jakelin Taussig, MD [facility]   To contact the attending provider between 7A-7P or the covering provider during after hours 7P-7A, please log into the web site [email or web address] and access using universal [place] password for that web site. If you do not have the password, please call the hospital operator.  01/08/2023, 1:11 PM

## 2023-01-09 DIAGNOSIS — E871 Hypo-osmolality and hyponatremia: Secondary | ICD-10-CM

## 2023-01-09 DIAGNOSIS — I480 Paroxysmal atrial fibrillation: Secondary | ICD-10-CM | POA: Diagnosis not present

## 2023-01-09 DIAGNOSIS — I4891 Unspecified atrial fibrillation: Secondary | ICD-10-CM | POA: Diagnosis not present

## 2023-01-09 LAB — BASIC METABOLIC PANEL
Anion gap: 9 (ref 5–15)
BUN: <5 mg/dL — ABNORMAL LOW (ref 8–23)
CO2: 20 mmol/L — ABNORMAL LOW (ref 22–32)
Calcium: 8.6 mg/dL — ABNORMAL LOW (ref 8.9–10.3)
Chloride: 103 mmol/L (ref 98–111)
Creatinine, Ser: 0.59 mg/dL (ref 0.44–1.00)
GFR, Estimated: >60 mL/min (ref >60)
Glucose, Bld: 127 mg/dL — ABNORMAL HIGH (ref 70–99)
Potassium: 4 mmol/L (ref 3.5–5.1)
Sodium: 132 mmol/L — ABNORMAL LOW (ref 135–145)

## 2023-01-09 LAB — CBC
HCT: 36.4 % (ref 36.0–46.0)
Hemoglobin: 13 g/dL (ref 12.0–15.0)
MCH: 32.2 pg (ref 26.0–34.0)
MCHC: 35.7 g/dL (ref 30.0–36.0)
MCV: 90.1 fL (ref 80.0–100.0)
Platelets: 349 10*3/uL (ref 150–400)
RBC: 4.04 MIL/uL (ref 3.87–5.11)
RDW: 11.8 % (ref 11.5–15.5)
WBC: 9.9 10*3/uL (ref 4.0–10.5)
nRBC: 0 % (ref 0.0–0.2)

## 2023-01-09 LAB — MAGNESIUM: Magnesium: 1.7 mg/dL (ref 1.7–2.4)

## 2023-01-09 LAB — GLUCOSE, CAPILLARY
Glucose-Capillary: 109 mg/dL — ABNORMAL HIGH (ref 70–99)
Glucose-Capillary: 99 mg/dL (ref 70–99)
Glucose-Capillary: 113 mg/dL — ABNORMAL HIGH (ref 70–99)
Glucose-Capillary: 202 mg/dL — ABNORMAL HIGH (ref 70–99)

## 2023-01-09 LAB — PHOSPHORUS: Phosphorus: 2.1 mg/dL — ABNORMAL LOW (ref 2.5–4.6)

## 2023-01-09 LAB — URINE CULTURE: Culture: NO GROWTH

## 2023-01-09 MED ORDER — SODIUM BICARBONATE 650 MG PO TABS
1300.0000 mg | ORAL_TABLET | Freq: Four times a day (QID) | ORAL | Status: AC
  Administered 2023-01-09 (×3): 1300 mg via ORAL
  Filled 2023-01-09 (×3): qty 2

## 2023-01-09 MED ORDER — MAGNESIUM SULFATE 2 GM/50ML IV SOLN
2.0000 g | Freq: Once | INTRAVENOUS | Status: AC
  Administered 2023-01-09: 2 g via INTRAVENOUS
  Filled 2023-01-09: qty 50

## 2023-01-09 MED ORDER — SODIUM PHOSPHATES 45 MMOLE/15ML IV SOLN
30.0000 mmol | Freq: Once | INTRAVENOUS | Status: AC
  Administered 2023-01-09: 30 mmol via INTRAVENOUS
  Filled 2023-01-09 (×2): qty 10

## 2023-01-09 MED ORDER — AMIODARONE HCL 200 MG PO TABS
400.0000 mg | ORAL_TABLET | Freq: Every day | ORAL | Status: DC
  Administered 2023-01-09 – 2023-01-11 (×3): 400 mg via ORAL
  Filled 2023-01-09 (×3): qty 2

## 2023-01-09 MED ORDER — APIXABAN 5 MG PO TABS
5.0000 mg | ORAL_TABLET | Freq: Two times a day (BID) | ORAL | Status: DC
  Administered 2023-01-09 – 2023-01-11 (×4): 5 mg via ORAL
  Filled 2023-01-09 (×4): qty 1

## 2023-01-09 MED ORDER — AMIODARONE HCL 200 MG PO TABS: 200.0000 mg | ORAL_TABLET | Freq: Every day | ORAL | Status: DC

## 2023-01-09 NOTE — Progress Notes (Signed)
PROGRESS NOTE    [name]  JXB:[id number] DOB: 1945-10-11 DOA: 01/07/2023 PCP: Anabel Halon, MD   Chief Complaint  Patient presents with   Fall    Brief Narrative:     [name] is a 77 y.o. female with medical history significant of diabetes, hypertension, GERD, hypothyroidism, depression, dementia.  Patient presents with weakness over the last 1 week.  No she is able to provide some history, her son provides history as well.  She began calling her family members to tell him that she has been sick with a GI bug since last week.  She had episodes of diarrhea with gas and dyspepsia.  Her family go her Pepto-Bismol and Imodium for the diarrhea, however she did not take much of it due to her dementia.  She normally lives alone and her family members call and check in on her physically.  Due to the diarrhea, her appetite has been mildly decreased.  She has been having some fevers and chills.  She had some mild dysuria, frequency.  Her son states that she does get confused at times due to the dementia   Assessment & Plan:   Principal Problem:   Hyponatremia Active Problems:   Essential hypertension   Gastroesophageal reflux disease   Diabetes mellitus (HCC)   Dementia   OSA (obstructive sleep apnea)   Hypokalemia   Parox A-fib -Patient on telemetry, she developed paroxysmal A-fib this morning -CHA2DS2-VASc score> 2, discussed with son 6 and benefits, will start on anticoagulation, pharmacy consulted to start on Eliquis -continue with Cardizem CD -Continue to replete her electrolytes to keep potassium> 4, magnesium> 2   Dehydration  Hyponatremia -Clinically dehydrated, -Continue with IV fluids, sodium is improving  Hypokalemia -Replaced -Monitor and replace as needed  Acute  metabolic encephalopathy - Possibly secondary to dehydration and hyponatremia - UA appears to be normal.  - improving as discussed with son at bedside as well she is with underlying  dementia -She is back to baseline  Dementia - Continue Aricept  Diabetes -Hold metformin and continue with insulin sliding scale  Lactic acidosis Low bicarb  -On bicarb supplement, hold metformin and continue with IV fluids -Continue with bicarb  GERD -Continue with PPI  Hypothyroidism -Continue with Synthroid  Hypertension -Continue with home meds  Hypophosphatemia -Replaced     DVT prophylaxis: Lovenox Code Status: Full code Family Communication: Discussed with son by phone Disposition:   Status is: Inpatient    Consultants:  cardiology   Subjective:  Significant events overnight, she denies any complaints.    Objective: Vitals:   01/08/23 2333 01/09/23 0423 01/09/23 0700 01/09/23 0802  BP: (!) 148/69 (!) 150/67 (!) 145/60 (!) 151/71  Pulse: 63 (!) 58  67  Resp: 15 18  18   Temp: 98.3 F (36.8 C) 97.6 F (36.4 C) 97.7 F (36.5 C) 97.8 F (36.6 C)  TempSrc: Oral Oral Oral Oral  SpO2: 96% 96%  98%  Weight:      Height:        Intake/Output Summary (Last 24 hours) at 01/09/2023 1349 Last data filed at 01/09/2023 0756 Gross per 24 hour  Intake --  Output 2500 ml  Net -2500 ml   Filed Weights   01/07/23 1039  Weight: 86.2 kg    Examination:  Awake Alert, is not, no apparent distress  symmetrical Chest wall movement, Good air movement bilaterally, CTAB RRR,No Gallops,Rubs or new Murmurs, No Parasternal Heave +ve B.Sounds, Abd Soft, No tenderness, No  rebound - guarding or rigidity. No Cyanosis, Clubbing or edema, No new Rash or bruise      Data Reviewed: I have personally reviewed following labs and imaging studies  CBC: Recent Labs  Lab 01/07/23 1109 01/09/23 0558  WBC 12.0* 9.9  NEUTROABS 10.2*  --   HGB 13.5 13.0  HCT 38.1 36.4  MCV 92.5 90.1  PLT 351 349    Basic Metabolic Panel: Recent Labs  Lab 01/07/23 1109 01/07/23 1445 01/08/23 0457 01/09/23 0558  NA 128*  --  130* 132*  K 3.2*  --  3.6 4.0  CL 95*  --  103  103  CO2 23  --  18* 20*  GLUCOSE 122*  --  86 127*  BUN 7*  --  <5* <5*  CREATININE 0.57  --  0.42* 0.59  CALCIUM 8.9  --  7.9* 8.6*  MG  --  2.1  --  1.7  PHOS  --   --   --  2.1*    GFR: Estimated Creatinine Clearance: 60.9 mL/min (by C-G formula based on SCr of 0.59 mg/dL).  Liver Function Tests: Recent Labs  Lab 01/07/23 1109  AST 39  ALT 66*  ALKPHOS 142*  BILITOT 0.8  PROT 6.5  ALBUMIN 3.6    CBG: Recent Labs  Lab 01/08/23 0805 01/08/23 1207 01/08/23 2135 01/09/23 0753 01/09/23 1139  GLUCAP 92 167* 142* 109* 99     Recent Results (from the past 240 hours)  Blood Culture (routine x 2)     Status: None (Preliminary result)   Collection Time: 01/07/23 11:09 AM   Specimen: BLOOD  Result Value Ref Range Status   Specimen Description BLOOD BLOOD RIGHT ARM  Final   Special Requests   Final    Blood Culture results may not be optimal due to an inadequate volume of blood received in culture bottles RAC   Culture   Final    NO GROWTH 2 DAYS Performed at [facility], [address], [place], [place] [address]    Report Status PENDING  Incomplete  Blood Culture (routine x 2)     Status: None (Preliminary result)   Collection Time: 01/07/23 11:32 AM   Specimen: BLOOD  Result Value Ref Range Status   Specimen Description BLOOD BLOOD RIGHT HAND  Final   Special Requests   Final    BOTTLES DRAWN AEROBIC ONLY Blood Culture results may not be optimal due to an inadequate volume of blood received in culture bottles   Culture   Final    NO GROWTH 2 DAYS Performed at [facility], [address] [place], [place] [address]    Report Status PENDING  Incomplete  Urine Culture     Status: None   Collection Time: 01/07/23  6:02 PM   Specimen: Urine, Clean Catch  Result Value Ref Range Status   Specimen Description   Final    URINE, CLEAN CATCH Performed at [facility], [address] [place], [place] [address]    Special Requests   Final    NONE Performed at  [facility], [address], [place], [place] [address]    Culture   Final    NO GROWTH Performed at [facility], [address], [place], [place] [address]    Report Status 01/09/2023 FINAL  Final         Radiology Studies: CT ABDOMEN PELVIS W CONTRAST Result Date: 01/07/2023 CLINICAL DATA:  Fall at home. Blunt trauma. Generalized abdominal  pain. Weakness. EXAM: CT ABDOMEN AND PELVIS WITH CONTRAST TECHNIQUE: Multidetector CT imaging of the abdomen and pelvis was performed using the standard protocol following bolus administration of intravenous contrast. RADIATION DOSE REDUCTION: This exam was performed according to the departmental dose-optimization program which includes automated exposure control, adjustment of the mA and/or kV according to patient size and/or use of iterative reconstruction technique. CONTRAST:  OMNIPAQUE IOHEXOL 300 MG/ML  SOLN COMPARISON:  05/05/2012 FINDINGS: Lower chest:  Unremarkable. Hepatobiliary: No hepatic laceration or mass identified. Prior cholecystectomy again noted. Increased diffuse biliary ductal dilatation is seen since prior study, however, no obstructing etiology is apparent by CT. Pancreas: No parenchymal laceration, mass, or inflammatory changes identified. No evidence of pancreatic ductal dilatation. Spleen: No evidence of splenic laceration. Adrenal/Urinary Tract: No hemorrhage or parenchymal lacerations identified. No evidence of suspicious renal masses or hydronephrosis. Unremarkable unopacified urinary bladder. Stomach/Bowel: Unopacified bowel loops are unremarkable in appearance. No evidence of hemoperitoneum. Diverticulosis is seen mainly involving the sigmoid colon, however there is no evidence of diverticulitis. Vascular/Lymphatic: No evidence of abdominal aortic injury. No pathologically enlarged lymph nodes identified. Reproductive:  No mass or other significant abnormality identified. Other:  None. Musculoskeletal: No acute fractures  or suspicious bone lesions identified. IMPRESSION: No evidence of traumatic injury within the abdomen or pelvis. Prior cholecystectomy. Increased diffuse biliary ductal dilatation since prior study, however, no obstructing etiology is apparent by CT. Recommend correlation with liver function tests, and consider MRCP for further evaluation. Colonic diverticulosis, without radiographic evidence of diverticulitis. Electronically Signed   By: Danae Orleans M.D.   On: 01/07/2023 17:42        Scheduled Meds:  atorvastatin  80 mg Oral Daily   buPROPion  300 mg Oral Daily   diltiazem  240 mg Oral Daily   donepezil  5 mg Oral QHS   DULoxetine  20 mg Oral BID   enoxaparin (LOVENOX) injection  40 mg Subcutaneous Q24H   feeding supplement  237 mL Oral BID BM   insulin aspart  0-15 Units Subcutaneous TID WC   insulin aspart  0-5 Units Subcutaneous QHS   levothyroxine  125 mcg Oral QAC breakfast   mirabegron ER  25 mg Oral Daily   pantoprazole  40 mg Oral Daily   sodium bicarbonate  1,300 mg Oral QID   Continuous Infusions:  sodium PHOSPHATE IVPB (in mmol) 30 mmol (01/09/23 1139)     LOS: 2 days      Huey Bienenstock, MD [facility]   To contact the attending provider between 7A-7P or the covering provider during after hours 7P-7A, please log into the web site [email or web address] and access using universal [place] password for that web site. If you do not have the password, please call the hospital operator.  01/09/2023, 1:49 PM

## 2023-01-09 NOTE — Evaluation (Signed)
Physical Therapy Evaluation Patient Details Name: [name] MRN: [id number] DOB: 11-09-1945 Today's Date: 01/09/2023  History of Present Illness  Pt is 77 yo presenting to [facility] ED with weakness over the last week. Pt had diarrhea with gas and dyspepsia. She had some mild dysuria with frequency. Pt lives alone. PMH: DM, HTN, GERD, hypothyroidism, depression, dementia.  Clinical Impression  Pt is presenting slightly below baseline level of functioning. Pt reports falls in the past 6 months. Currently pt is supervision for sit to stand and gait with RW. Pt is Min A for stepping up one step per home set up. Pt will require assistance for step into home and most likely supervision for safety. Due to pt current functional status, home set up and available assistance at home recommending skilled physical therapy services 3x/week and supervision 24/7 in order to address strength, balance and functional mobility to decrease risk for falls, injury and re-hospitalization.        If plan is discharge home, recommend the following: Supervision due to cognitive status;Help with stairs or ramp for entrance;Assist for transportation     Equipment Recommendations Rolling walker (2 wheels);BSC/3in1     Functional Status Assessment Patient has had a recent decline in their functional status and demonstrates the ability to make significant improvements in function in a reasonable and predictable amount of time.     Precautions / Restrictions Precautions Precautions: Fall Restrictions Weight Bearing Restrictions Per Provider Order: No      Mobility  Bed Mobility     General bed mobility comments: Pt in recliner upon arrival and departure    Transfers Overall transfer level: Needs assistance Equipment used: Rolling walker (2 wheels) Transfers: Sit to/from Stand Sit to Stand: Supervision           General transfer comment: increased time    Ambulation/Gait Ambulation/Gait assistance:  Supervision Gait Distance (Feet): 100 Feet Assistive device: Rolling walker (2 wheels) Gait Pattern/deviations: Step-through pattern, Decreased stride length Gait velocity: decreased Gait velocity interpretation: <1.31 ft/sec, indicative of household ambulator   General Gait Details: no overt LOB. use of RW  Stairs Stairs: Yes Stairs assistance: Min assist Stair Management: One rail Left, Step to pattern, Forwards Number of Stairs: 1 General stair comments: Min A for balance pt leans far forward than progresses up step       Balance Overall balance assessment: Mild deficits observed, not formally tested         Pertinent Vitals/Pain Pain Assessment Pain Assessment: No/denies pain    Home Living Family/patient expects to be discharged to:: Private residence Living Arrangements: Alone (pt states that she has someone who she can hire to help her. She states that her son works and lives in town and her daughter lives in [place].) Available Help at Discharge: Family Type of Home: House Home Access: Stairs to enter Entrance Stairs-Rails: None Entrance Stairs-Number of Steps: 1 +1 (down one step then walk a little and up another step)   Home Layout: One level Home Equipment: Agricultural consultant (2 wheels);Cane - single point;Shower seat - built in      Prior Function Prior Level of Function : Independent/Modified Independent             Mobility Comments: Pt states that in the past she has used a cane and walker. Pt states that her son found her on the floor. Pt states that she has had about 3-4 falls this year. ADLs Comments: Pt states she is ind and manages her  money and medications ind. Pt states that she drives (unsure if this is true due to cognitive status.)     Extremity/Trunk Assessment   Upper Extremity Assessment Upper Extremity Assessment: Overall WFL for tasks assessed    Lower Extremity Assessment Lower Extremity Assessment: Overall WFL for tasks  assessed    Cervical / Trunk Assessment Cervical / Trunk Assessment: Normal  Communication   Communication Communication: No apparent difficulties  Cognition Arousal: Alert Behavior During Therapy: WFL for tasks assessed/performed Overall Cognitive Status: History of cognitive impairments - at baseline       General Comments: Pt was able to perform all activities as needed was Alert and oriented x 3 to person, place, month        General Comments General comments (skin integrity, edema, etc.): No noted skin issues outside area of gown.        Assessment/Plan    PT Assessment Patient needs continued PT services  PT Problem List Decreased mobility;Decreased balance       PT Treatment Interventions DME instruction;Therapeutic exercise;Gait training;Balance training;Stair training;Functional mobility training;Therapeutic activities;Patient/family education;Neuromuscular re-education    PT Goals (Current goals can be found in the Care Plan section)  Acute Rehab PT Goals Patient Stated Goal: to return home PT Goal Formulation: With patient Time For Goal Achievement: 01/23/23 Potential to Achieve Goals: Fair    Frequency Min 1X/week        AM-PAC PT "6 Clicks" Mobility  Outcome Measure Help needed turning from your back to your side while in a flat bed without using bedrails?: A Little Help needed moving from lying on your back to sitting on the side of a flat bed without using bedrails?: A Little Help needed moving to and from a bed to a chair (including a wheelchair)?: A Little Help needed standing up from a chair using your arms (e.g., wheelchair or bedside chair)?: A Little Help needed to walk in hospital room?: A Little Help needed climbing 3-5 steps with a railing? : A Little 6 Click Score: 18    End of Session Equipment Utilized During Treatment: Gait belt Activity Tolerance: Patient tolerated treatment well Patient left: in chair;with call bell/phone within  reach Nurse Communication: Mobility status PT Visit Diagnosis: Unsteadiness on feet (R26.81);Other abnormalities of gait and mobility (R26.89)    Time: 0865-7846 PT Time Calculation (min) (ACUTE ONLY): 31 min   Charges:   PT Evaluation $PT Eval Low Complexity: 1 Low PT Treatments $Therapeutic Activity: 8-22 mins PT General Charges $$ ACUTE PT VISIT: 1 Visit        Harrel Carina, DPT, CLT  Acute Rehabilitation Services Office: [phone] (Secure chat preferred)   Claudia Desanctis 01/09/2023, 1:16 PM

## 2023-01-09 NOTE — Plan of Care (Signed)
  Problem: Health Behavior/Discharge Planning: Goal: Ability to manage health-related needs will improve Outcome: Progressing   Problem: Clinical Measurements: Goal: Ability to maintain clinical measurements within normal limits will improve Outcome: Progressing   Problem: Activity: Goal: Risk for activity intolerance will decrease Outcome: Progressing   Problem: Safety: Goal: Ability to remain free from injury will improve Outcome: Progressing   

## 2023-01-09 NOTE — Consult Note (Signed)
   Electrophysiology Consultation   Patient ID: [name] MRN: [id number]; DOB: 1945-05-30  Admit date: 01/07/2023 Date of Consult: 01/09/2023  PCP:  Anabel Halon, MD   History of Present Illness:   Ms. Coontz is a 77yo woman who I am seeing today for an evaluation of new onset atrial fibrillation at the request of Dr Randol Kern. She has a history of HTN, DM, GERD, depression, dementia. She was admitted with hypovolemia due to recent diarrheal illness and has improved while hospitalized with IV fluids. Today, prior to discharge telemetry revealed AF which is a new diagnosis for her.  During my exam, the patient's HR was consistently in the 140-150s. Thankfully, she is not very symptomatic.  Past medical, surgical, social and family history reviewed.  ROS:  Please see the history of present illness.  All other ROS reviewed and negative.     Physical Exam/Data:   Vitals:   01/08/23 2333 01/09/23 0423 01/09/23 0700 01/09/23 0802  BP: (!) 148/69 (!) 150/67 (!) 145/60 (!) 151/71  Pulse: 63 (!) 58  67  Resp: 15 18  18   Temp: 98.3 F (36.8 C) 97.6 F (36.4 C) 97.7 F (36.5 C) 97.8 F (36.6 C)  TempSrc: Oral Oral Oral Oral  SpO2: 96% 96%  98%  Weight:      Height:        General:  Well nourished, well developed, in no acute distress Cardiac:  normal S1, S2; irregularly irregular Lungs:  clear to auscultation bilaterally, no wheezing, rhonchi or rales  Psych:  Normal affect   EKG:  The EKG was personally reviewed and demonstrates:  AF. RBBB.   Telemetry:  Telemetry was personally reviewed and demonstrates:  frequent bursts of rapidly conducted AF. There are frequent sinus beats over the last few hours.  2021 Echo reviewed: EF 65, RV normal. Mildly dilated LA    Assessment and Plan:   #AF New diagnosis for her.   CHA2DS2-VASc Score = 5  The patient's score is based upon: CHF History: 0 HTN History: 1 Diabetes History: 1 Stroke History: 0 Vascular  Disease History: 0 Age Score: 2 Gender Score: 1  Recommend starting Eliquis 5mg  by mouth twice daily.  Given conduction system disease, antiarrhythmic options are limited.  For now, continue diltiazem 240mg  by mouth daily.   Add amiodarone 400mg  daily for 5 days followed by 200mg  daily.    #RBBB and LAFB Seen in prior ECG's. Limits ability to use Class IC agents.   Sheria Lang T. Lalla Brothers, MD, [facility], [facility] Cardiac Electrophysiology

## 2023-01-09 NOTE — Progress Notes (Addendum)
PHARMACY - ANTICOAGULATION CONSULT NOTE  Pharmacy Consult for apixaban Indication: atrial fibrillation  Allergies  Allergen Reactions   Amoxicillin-Pot Clavulanate Rash   Atenolol Rash   Penicillins Rash    Tolerated Cephalosporin Date: 02/17/21.     Sulfonamide Derivatives Rash    Patient Measurements: Height: 5\' 2"  (157.5 cm) Weight: 86.2 kg (190 lb) IBW/kg (Calculated) : 50.1   Vital Signs: Temp: 97.8 F (36.6 C) (12/15 0802) Temp Source: Oral (12/15 0802) BP: 151/71 (12/15 0802) Pulse Rate: 67 (12/15 0802)  Labs: Recent Labs    01/07/23 1109 01/08/23 0457 01/09/23 0558  HGB 13.5  --  13.0  HCT 38.1  --  36.4  PLT 351  --  349  APTT 26  --   --   LABPROT 13.3  --   --   INR 1.0  --   --   CREATININE 0.57 0.42* 0.59  CKTOTAL 162  --   --     Estimated Creatinine Clearance: 60.9 mL/min (by C-G formula based on SCr of 0.59 mg/dL).   Medical History: Past Medical History:  Diagnosis Date   Allergic rhinitis    Arthritis    B12 deficiency    Bipolar disorder (HCC)    Coronary artery disease    Depression    Diabetes mellitus (HCC)    Diabetes mellitus (HCC) 10/05/2021   Essential hypertension    GERD (gastroesophageal reflux disease)    Hashimoto's thyroiditis    History of transient ischemic attack (TIA)    Or possibly migraine as well as right amaurosis fugax and ataxia - Dr. Sandria Manly   Hyperlipidemia    Hypothyroidism    PSVT (paroxysmal supraventricular tachycardia) (HCC)    Right bundle branch block    Assessment: 77YOF with PMH of HTN, DM, GERD, depression, dementia with new onset atrial fibrillation. Pharmacy consulted to dose apixaban.  Hgb 13, plt 349 WNL Scr 0.59 (<1.5 and at baseline), 86kg (>60kg), 77YO (<80) No signs of bleeding noted  Plan:  Start apixaban 5mg  PO BID Monitor CBC and for signs/symptoms of bleeding Will check copay tomorrow Will sign off and pharmacy will follow peripherally   Stephenie Acres, PharmD PGY1  Pharmacy Resident 01/09/2023 2:57 PM

## 2023-01-09 NOTE — Plan of Care (Signed)

## 2023-01-09 NOTE — Discharge Instructions (Signed)

## 2023-01-10 ENCOUNTER — Inpatient Hospital Stay (HOSPITAL_COMMUNITY): Payer: PPO

## 2023-01-10 ENCOUNTER — Telehealth (HOSPITAL_COMMUNITY): Payer: Self-pay

## 2023-01-10 ENCOUNTER — Other Ambulatory Visit (HOSPITAL_COMMUNITY): Payer: Self-pay

## 2023-01-10 ENCOUNTER — Other Ambulatory Visit: Payer: Self-pay

## 2023-01-10 DIAGNOSIS — I4891 Unspecified atrial fibrillation: Secondary | ICD-10-CM

## 2023-01-10 DIAGNOSIS — E871 Hypo-osmolality and hyponatremia: Secondary | ICD-10-CM | POA: Diagnosis not present

## 2023-01-10 LAB — GLUCOSE, CAPILLARY
Glucose-Capillary: 126 mg/dL — ABNORMAL HIGH (ref 70–99)
Glucose-Capillary: 144 mg/dL — ABNORMAL HIGH (ref 70–99)
Glucose-Capillary: 194 mg/dL — ABNORMAL HIGH (ref 70–99)
Glucose-Capillary: 119 mg/dL — ABNORMAL HIGH (ref 70–99)

## 2023-01-10 LAB — CBC
HCT: 36.2 % (ref 36.0–46.0)
Hemoglobin: 13 g/dL (ref 12.0–15.0)
MCH: 32.2 pg (ref 26.0–34.0)
MCHC: 35.9 g/dL (ref 30.0–36.0)
MCV: 89.6 fL (ref 80.0–100.0)
Platelets: 356 10*3/uL (ref 150–400)
RBC: 4.04 MIL/uL (ref 3.87–5.11)
RDW: 11.8 % (ref 11.5–15.5)
WBC: 9.8 10*3/uL (ref 4.0–10.5)
nRBC: 0 % (ref 0.0–0.2)

## 2023-01-10 LAB — PHOSPHORUS: Phosphorus: 3.2 mg/dL (ref 2.5–4.6)

## 2023-01-10 LAB — BASIC METABOLIC PANEL
Anion gap: 10 (ref 5–15)
BUN: <5 mg/dL — ABNORMAL LOW (ref 8–23)
CO2: 24 mmol/L (ref 22–32)
Calcium: 8.6 mg/dL — ABNORMAL LOW (ref 8.9–10.3)
Chloride: 96 mmol/L — ABNORMAL LOW (ref 98–111)
Creatinine, Ser: 0.46 mg/dL (ref 0.44–1.00)
GFR, Estimated: >60 mL/min (ref >60)
Glucose, Bld: 136 mg/dL — ABNORMAL HIGH (ref 70–99)
Potassium: 3.4 mmol/L — ABNORMAL LOW (ref 3.5–5.1)
Sodium: 130 mmol/L — ABNORMAL LOW (ref 135–145)

## 2023-01-10 LAB — ECHOCARDIOGRAM COMPLETE
AR max vel: 2.58 cm2
AV Peak grad: 7.2 mm[Hg]
Ao pk vel: 1.34 m/s
Area-P 1/2: 4.49 cm2
Height: 62 in
S' Lateral: 3.1 cm
Weight: 3040 [oz_av]

## 2023-01-10 LAB — MAGNESIUM: Magnesium: 1.9 mg/dL (ref 1.7–2.4)

## 2023-01-10 MED ORDER — POTASSIUM CHLORIDE CRYS ER 20 MEQ PO TBCR
40.0000 meq | EXTENDED_RELEASE_TABLET | Freq: Once | ORAL | Status: AC
  Administered 2023-01-10: 40 meq via ORAL
  Filled 2023-01-10: qty 2

## 2023-01-10 MED ORDER — METHOCARBAMOL 500 MG PO TABS
500.0000 mg | ORAL_TABLET | Freq: Three times a day (TID) | ORAL | Status: DC | PRN
  Administered 2023-01-10 – 2023-01-11 (×2): 500 mg via ORAL
  Filled 2023-01-10 (×2): qty 1

## 2023-01-10 NOTE — Telephone Encounter (Signed)
Pharmacy Patient Advocate Encounter  Insurance verification completed.    The patient is insured through [employer]/ [employer]. Patient has Medicare and is not eligible for a copay card, but may be able to apply for patient assistance, if available.    Ran test claim for Eliquis and the current 30 day co-pay is $0.00.   This test claim was processed through [facility]- copay amounts may vary at other pharmacies due to pharmacy/plan contracts, or as the patient moves through the different stages of their insurance plan.

## 2023-01-10 NOTE — Plan of Care (Signed)
  Problem: Health Behavior/Discharge Planning: Goal: Ability to manage health-related needs will improve Outcome: Progressing   Problem: Clinical Measurements: Goal: Will remain free from infection Outcome: Progressing   Problem: Activity: Goal: Risk for activity intolerance will decrease Outcome: Progressing   Problem: Safety: Goal: Ability to remain free from injury will improve Outcome: Progressing   

## 2023-01-10 NOTE — Progress Notes (Signed)
Mobility Specialist Progress Note:    01/10/23 1000  Mobility  Activity Ambulated with assistance to bathroom  Level of Assistance Standby assist, set-up cues, supervision of patient - no hands on  Assistive Device Front wheel walker  Distance Ambulated (ft) 15 ft  Activity Response Tolerated well  Mobility Referral Yes  Mobility visit 1 Mobility  Mobility Specialist Start Time (ACUTE ONLY) 1000  Mobility Specialist Stop Time (ACUTE ONLY) 1010  Mobility Specialist Time Calculation (min) (ACUTE ONLY) 10 min    Responded to bathroom light, requesting assistance to return to chair in room. Tolerated well, asx throughout. SBA with RW required. Pt sitting up in chair comfortably with call bell in reach and all needs met.   Feliciana Rossetti Mobility Specialist Please contact via Special educational needs teacher or  Rehab office at [phone]

## 2023-01-10 NOTE — Progress Notes (Signed)
Echocardiogram 2D Echocardiogram has been performed.  [name] 01/10/2023, 5:28 PM

## 2023-01-10 NOTE — TOC Progression Note (Signed)
Transition of Care [facility]) - Progression Note    Patient Details  Name: [name] MRN: [id number] Date of Birth: [birth year]  Transition of Care [facility]) CM/SW Contact  Gordy Clement, RN Phone Number: 01/10/2023, 4:01 PM  Clinical Narrative:     RNCM called and spoke with Son rearding DC plan- HH/DME  He will be transporting at DC. TOC will continue to follow patient for any additional discharge needs           Expected Discharge Plan and Services                                               Social Determinants of Health (SDOH) Interventions SDOH Screenings   Food Insecurity: No Food Insecurity (01/07/2023)  Housing: Low Risk  (01/07/2023)  Transportation Needs: No Transportation Needs (01/07/2023)  Utilities: Not At Risk (01/07/2023)  Alcohol Screen: Low Risk  (02/13/2021)  Depression (PHQ2-9): High Risk (10/06/2022)  Financial Resource Strain: Low Risk  (02/13/2021)  Physical Activity: Inactive (02/13/2021)  Social Connections: Unknown (06/01/2021)   Received from [facility]  Stress: No Stress Concern Present (02/13/2021)  Tobacco Use: Medium Risk (01/07/2023)    Readmission Risk Interventions     No data to display

## 2023-01-10 NOTE — Progress Notes (Addendum)
PROGRESS NOTE    [name]  JJO:[id number] DOB: [birth year] DOA: 01/07/2023 PCP: Anabel Halon, MD   Chief Complaint  Patient presents with   Fall    Brief Narrative:     [name] is a 77 y.o. female with medical history significant of diabetes, hypertension, GERD, hypothyroidism, depression, dementia.  Patient presents with weakness over the last 1 week.  No she is able to provide some history, her son provides history as well.  She began calling her family members to tell him that she has been sick with a GI bug since last week.  She had episodes of diarrhea with gas and dyspepsia.  Her family go her Pepto-Bismol and Imodium for the diarrhea, however she did not take much of it due to her dementia.  She normally lives alone and her family members call and check in on her physically.  Due to the diarrhea, her appetite has been mildly decreased.  She has been having some fevers and chills.  She had some mild dysuria, frequency.  Her son states that she does get confused at times due to the dementia   Assessment & Plan:   Principal Problem:   Hyponatremia Active Problems:   Essential hypertension   Gastroesophageal reflux disease   Diabetes mellitus (HCC)   Dementia   OSA (obstructive sleep apnea)   Hypokalemia   Parox A-fib -Patient on telemetry, she developed paroxysmal A-fib this morning -CHA2DS2-VASc score> 2, discussed with son 6 and benefits, will start on anticoagulation, started on Eliquis -continue with Cardizem CD -Continue to replete her electrolytes to keep potassium> 4, magnesium> 2 -Input greatly appreciated, started on amiodarone, 400 mg oral daily x 5 days, then 200 mg oral daily -2D echo is pending   Dehydration  Hyponatremia -Clinically dehydrated, -Continue with IV fluids, sodium is improving  Hypokalemia Hypophosphatemia -Replaced -Monitor and replace as needed  Acute  metabolic encephalopathy - Possibly secondary to dehydration and  hyponatremia - UA appears to be normal.  - improving as discussed with son at bedside as well she is with underlying dementia -She is back to baseline  Dementia - Continue Aricept  Diabetes -Hold metformin and continue with insulin sliding scale  Lactic acidosis Low bicarb  -On bicarb supplement, hold metformin and continue with IV fluids -Continue with bicarb  GERD -Continue with PPI  Hypothyroidism -Continue with Synthroid  Hypertension -Continue with home meds  Hypophosphatemia -Replaced     DVT prophylaxis: Lovenox Code Status: Full code Family Communication: Discussed with son at bedside Disposition: With home health, pending further workup including echo and monitoring of heart rate and adjusting amiodarone dose.  Status is: Inpatient    Consultants:  cardiology   Subjective:  No significant events overnight, she denies any complaints, back in sinus rhythm  Objective: Vitals:   01/09/23 2333 01/10/23 0510 01/10/23 0810 01/10/23 1213  BP: (!) 150/67 (!) 141/54 (!) 147/66 (!) 142/51  Pulse: 69 65 60 64  Resp: 18 17 (!) 21 20  Temp: 98.1 F (36.7 C) 98.4 F (36.9 C) 98 F (36.7 C) 98.1 F (36.7 C)  TempSrc: Oral Oral Oral Oral  SpO2: 96% 100% 96% 98%  Weight:      Height:        Intake/Output Summary (Last 24 hours) at 01/10/2023 1533 Last data filed at 01/10/2023 1300 Gross per 24 hour  Intake 480 ml  Output 2350 ml  Net -1870 ml   Filed Weights   01/07/23 1039  Weight: 86.2 kg    Examination:  Awake Alert, does not, no apparent distress Symmetrical Chest wall movement, Good air movement bilaterally, CTAB RRR,No Gallops,Rubs or new Murmurs, No Parasternal Heave +ve B.Sounds, Abd Soft, No tenderness, No rebound - guarding or rigidity. No Cyanosis, Clubbing or edema, No new Rash or bruise      Data Reviewed: I have personally reviewed following labs and imaging studies  CBC: Recent Labs  Lab 01/07/23 1109 01/09/23 0558  01/10/23 0745  WBC 12.0* 9.9 9.8  NEUTROABS 10.2*  --   --   HGB 13.5 13.0 13.0  HCT 38.1 36.4 36.2  MCV 92.5 90.1 89.6  PLT 351 349 356    Basic Metabolic Panel: Recent Labs  Lab 01/07/23 1109 01/07/23 1445 01/08/23 0457 01/09/23 0558 01/10/23 0745  NA 128*  --  130* 132* 130*  K 3.2*  --  3.6 4.0 3.4*  CL 95*  --  103 103 96*  CO2 23  --  18* 20* 24  GLUCOSE 122*  --  86 127* 136*  BUN 7*  --  <5* <5* <5*  CREATININE 0.57  --  0.42* 0.59 0.46  CALCIUM 8.9  --  7.9* 8.6* 8.6*  MG  --  2.1  --  1.7 1.9  PHOS  --   --   --  2.1* 3.2    GFR: Estimated Creatinine Clearance: 60.9 mL/min (by C-G formula based on SCr of 0.46 mg/dL).  Liver Function Tests: Recent Labs  Lab 01/07/23 1109  AST 39  ALT 66*  ALKPHOS 142*  BILITOT 0.8  PROT 6.5  ALBUMIN 3.6    CBG: Recent Labs  Lab 01/09/23 1139 01/09/23 1657 01/09/23 2132 01/10/23 0811 01/10/23 1212  GLUCAP 99 113* 202* 126* 144*     Recent Results (from the past 240 hours)  Blood Culture (routine x 2)     Status: None (Preliminary result)   Collection Time: 01/07/23 11:09 AM   Specimen: BLOOD  Result Value Ref Range Status   Specimen Description BLOOD BLOOD RIGHT ARM  Final   Special Requests   Final    Blood Culture results may not be optimal due to an inadequate volume of blood received in culture bottles RAC   Culture   Final    NO GROWTH 3 DAYS Performed at [facility], [address] [place], [place] [address]    Report Status PENDING  Incomplete  Blood Culture (routine x 2)     Status: None (Preliminary result)   Collection Time: 01/07/23 11:32 AM   Specimen: BLOOD  Result Value Ref Range Status   Specimen Description BLOOD BLOOD RIGHT HAND  Final   Special Requests   Final    BOTTLES DRAWN AEROBIC ONLY Blood Culture results may not be optimal due to an inadequate volume of blood received in culture bottles   Culture   Final    NO GROWTH 3 DAYS Performed at [facility], [address],  [place], [place] [address]    Report Status PENDING  Incomplete  Urine Culture     Status: None   Collection Time: 01/07/23  6:02 PM   Specimen: Urine, Clean Catch  Result Value Ref Range Status   Specimen Description   Final    URINE, CLEAN CATCH Performed at [facility], [address] [place], [place] [address]    Special Requests   Final    NONE Performed at [facility], [address] [place], [place] [address]  Culture   Final    NO GROWTH Performed at [facility], [address] [place], [place] [address]    Report Status 01/09/2023 FINAL  Final         Radiology Studies: No results found.       Scheduled Meds:  amiodarone  400 mg Oral Daily   Followed by   Melene Muller ON 01/14/2023] amiodarone  200 mg Oral Daily   apixaban  5 mg Oral BID   atorvastatin  80 mg Oral Daily   buPROPion  300 mg Oral Daily   diltiazem  240 mg Oral Daily   donepezil  5 mg Oral QHS   DULoxetine  20 mg Oral BID   feeding supplement  237 mL Oral BID BM   insulin aspart  0-15 Units Subcutaneous TID WC   insulin aspart  0-5 Units Subcutaneous QHS   levothyroxine  125 mcg Oral QAC breakfast   mirabegron ER  25 mg Oral Daily   pantoprazole  40 mg Oral Daily   Continuous Infusions:     LOS: 3 days      Huey Bienenstock, MD [facility]   To contact the attending provider between 7A-7P or the covering provider during after hours 7P-7A, please log into the web site [email or web address] and access using universal [place] password for that web site. If you do not have the password, please call the hospital operator.  01/10/2023, 3:33 PM

## 2023-01-10 NOTE — Progress Notes (Signed)
   01/10/23 1541  TOC Brief Assessment  Insurance and Status Reviewed ([employer])  Patient has primary care physician Yes Allena Katz, Earlie Lou, MD)  Home environment has been reviewed Home alone  Prior level of function: independent  Prior/Current Home Services No current home services  Social Drivers of Health Review SDOH reviewed no interventions necessary  Readmission risk has been reviewed Yes  Transition of care needs transition of care needs identified, TOC will continue to follow (Will need home PT and SN for med and DM management)

## 2023-01-10 NOTE — Progress Notes (Signed)
Physical Therapy Treatment Patient Details Name: [name] MRN: [id number] DOB: 06/11/1945 Today's Date: 01/10/2023   History of Present Illness Pt is 77 yo presenting to [facility] ED with weakness over the last week. Pt had diarrhea with gas and dyspepsia. She had some mild dysuria with frequency. Pt lives alone. PMH: DM, HTN, GERD, hypothyroidism, depression, dementia.    PT Comments  Pt received sitting in the recliner and agreeable to session. Pt able to tolerate increased gait distance and stair trial this session. Pt demonstrates good stability with RW support and cues for increased step length. Pt demonstrates improved stability descending stairs backwards compared to forwards, however anticipate improved stability at home with use of RW on pt's 1 step to enter. Pt demonstrates increased difficulty with RW management during obstacle negotiation, but no LOB. Pt continues to benefit from PT services to progress toward functional mobility goals.    If plan is discharge home, recommend the following: Supervision due to cognitive status;Help with stairs or ramp for entrance;Assist for transportation   Can travel by private vehicle        Equipment Recommendations  Rolling walker (2 wheels);BSC/3in1    Recommendations for Other Services       Precautions / Restrictions Precautions Precautions: Fall Restrictions Weight Bearing Restrictions Per Provider Order: No     Mobility  Bed Mobility               General bed mobility comments: Pt in recliner upon arrival and departure    Transfers Overall transfer level: Needs assistance Equipment used: Rolling walker (2 wheels) Transfers: Sit to/from Stand Sit to Stand: Supervision                Ambulation/Gait Ambulation/Gait assistance: Supervision Gait Distance (Feet): 250 Feet Assistive device: Rolling walker (2 wheels) Gait Pattern/deviations: Step-through pattern, Decreased stride length Gait velocity:  decreased     General Gait Details: Cues for RW proximity and increased step length with pt able to improve   Stairs Stairs: Yes Stairs assistance: Min assist, Contact guard assist Stair Management: One rail Right, Step to pattern Number of Stairs: 2 General stair comments: CGA for ascending and min A progressing to CGA for descent with cues for technique       Balance Overall balance assessment: Mild deficits observed, not formally tested                                          Cognition Arousal: Alert Behavior During Therapy: WFL for tasks assessed/performed Overall Cognitive Status: History of cognitive impairments - at baseline                                          Exercises      General Comments        Pertinent Vitals/Pain Pain Assessment Pain Assessment: No/denies pain     PT Goals (current goals can now be found in the care plan section) Acute Rehab PT Goals Patient Stated Goal: to return home PT Goal Formulation: With patient Time For Goal Achievement: 01/23/23 Progress towards PT goals: Progressing toward goals    Frequency    Min 1X/week       AM-PAC PT "6 Clicks" Mobility   Outcome Measure  Help needed turning from your back  to your side while in a flat bed without using bedrails?: A Little Help needed moving from lying on your back to sitting on the side of a flat bed without using bedrails?: A Little Help needed moving to and from a bed to a chair (including a wheelchair)?: A Little Help needed standing up from a chair using your arms (e.g., wheelchair or bedside chair)?: A Little Help needed to walk in hospital room?: A Little Help needed climbing 3-5 steps with a railing? : A Little 6 Click Score: 18    End of Session Equipment Utilized During Treatment: Gait belt Activity Tolerance: Patient tolerated treatment well Patient left: with call bell/phone within reach (in bathroom with NT  aware) Nurse Communication: Mobility status PT Visit Diagnosis: Unsteadiness on feet (R26.81);Other abnormalities of gait and mobility (R26.89)     Time: 9629-5284 PT Time Calculation (min) (ACUTE ONLY): 28 min  Charges:    $Gait Training: 23-37 mins PT General Charges $$ ACUTE PT VISIT: 1 Visit                     Johny Shock, PTA Acute Rehabilitation Services Secure Chat Preferred  [phone]    Johny Shock 01/10/2023, 9:55 AM

## 2023-01-10 NOTE — Progress Notes (Signed)
  Patient Name: [name] Date of Encounter: 01/10/2023  Primary Cardiologist: Nona Dell, MD Electrophysiologist: None  Interval Summary   The patient is doing well today.  Son in room, states pt struggles with depression and at times does not remember to take her meds.   The patient denies chest pain, shortness of breath, or any new concerns.  Vital Signs    Vitals:   01/09/23 1936 01/09/23 2333 01/10/23 0510 01/10/23 0810  BP: (!) 141/68 (!) 150/67 (!) 141/54 (!) 147/66  Pulse: 62 69 65 60  Resp: 18 18 17  (!) 21  Temp: 97.9 F (36.6 C) 98.1 F (36.7 C) 98.4 F (36.9 C) 98 F (36.7 C)  TempSrc: Oral Oral Oral Oral  SpO2: 97% 96% 100% 96%  Weight:      Height:        Intake/Output Summary (Last 24 hours) at 01/10/2023 1034 Last data filed at 01/10/2023 0510 Gross per 24 hour  Intake --  Output 2350 ml  Net -2350 ml   Filed Weights   01/07/23 1039  Weight: 86.2 kg    Physical Exam    GEN- The patient is well appearing, alert and oriented x 3 today.   Lungs- Clear to ausculation bilaterally, normal work of breathing Cardiac- Regular rate and rhythm, no murmurs, rubs or gallops GI- soft, NT, ND, + BS Extremities- no clubbing or cyanosis. No edema  Telemetry    AF which converted 12/15 ~345pm to SR 60's (personally reviewed)  Hospital Course    [name] is a 77 y.o. female with PMH of HTN, DM, GERD, depression, dementia admitted 01/07/23 with diarrheal illness, hypovolemia.  Prior to anticipated discharge, she was noted to have AF which was new for her. Started on amiodarone. Converted 12/15 345pm to SR.   Assessment & Plan    Atrial Fibrillation  New dx 12/2022 for patient. CHA2DS2-VASc 5.  -continue eliquis 5mg  BID  -continue diltiazem 240mg  every day  -continue amiodarone 400mg  every day x5 days, then 200mg  daily  -conduction system disease limits antiarrhythmic options  -await ECHO to ensure to tachy mediated reduction of EF    RBBB, LAFB -noted on prior EKG's, limits ability AAD 1C agents       For questions or updates, please contact [facility] Please consult [email or web address] for contact info under Cardiology/STEMI.  Signed, Canary Brim, MSN, APRN, NP-C, AGACNP-BC [facility] - Electrophysiology  01/10/2023, 10:35 AM

## 2023-01-10 NOTE — Care Management Important Message (Signed)
Important Message  Patient Details  Name: [name] MRN: [id number] Date of Birth: 05-20-1945   Important Message Given:  Yes - Medicare IM     Dorena Bodo 01/10/2023, 4:27 PM

## 2023-01-10 NOTE — Progress Notes (Signed)
Attempted Echocardiogram, patient is not in the bed.

## 2023-01-11 ENCOUNTER — Other Ambulatory Visit (HOSPITAL_COMMUNITY): Payer: Self-pay

## 2023-01-11 DIAGNOSIS — I4891 Unspecified atrial fibrillation: Secondary | ICD-10-CM | POA: Diagnosis not present

## 2023-01-11 DIAGNOSIS — E871 Hypo-osmolality and hyponatremia: Secondary | ICD-10-CM | POA: Diagnosis not present

## 2023-01-11 DIAGNOSIS — I480 Paroxysmal atrial fibrillation: Secondary | ICD-10-CM | POA: Diagnosis not present

## 2023-01-11 LAB — BASIC METABOLIC PANEL
Anion gap: 11 (ref 5–15)
BUN: <5 mg/dL — ABNORMAL LOW (ref 8–23)
CO2: 21 mmol/L — ABNORMAL LOW (ref 22–32)
Calcium: 8.7 mg/dL — ABNORMAL LOW (ref 8.9–10.3)
Chloride: 97 mmol/L — ABNORMAL LOW (ref 98–111)
Creatinine, Ser: 0.45 mg/dL (ref 0.44–1.00)
GFR, Estimated: >60 mL/min (ref >60)
Glucose, Bld: 120 mg/dL — ABNORMAL HIGH (ref 70–99)
Potassium: 3.8 mmol/L (ref 3.5–5.1)
Sodium: 129 mmol/L — ABNORMAL LOW (ref 135–145)

## 2023-01-11 LAB — GLUCOSE, CAPILLARY
Glucose-Capillary: 151 mg/dL — ABNORMAL HIGH (ref 70–99)
Glucose-Capillary: 136 mg/dL — ABNORMAL HIGH (ref 70–99)

## 2023-01-11 MED ORDER — AMIODARONE HCL 200 MG PO TABS
ORAL_TABLET | ORAL | 0 refills | Status: DC
  Filled 2023-01-11: qty 32, 27d supply, fill #0

## 2023-01-11 MED ORDER — APIXABAN 5 MG PO TABS
5.0000 mg | ORAL_TABLET | Freq: Two times a day (BID) | ORAL | 0 refills | Status: DC
  Filled 2023-01-11: qty 60, 30d supply, fill #0

## 2023-01-11 NOTE — Progress Notes (Signed)
  Patient Name: [name] Date of Encounter: 01/11/2023  Primary Cardiologist: Nona Dell, MD Electrophysiologist: None  Interval Summary   The patient is doing well today.  At this time, the patient denies chest pain, shortness of breath, or any new concerns.  Vital Signs    Vitals:   01/10/23 2020 01/10/23 2332 01/11/23 0504 01/11/23 0753  BP:  (!) 158/73  (!) 140/74  Pulse: 62 65 67 (!) 59  Resp: 13 16 17 19   Temp: 98.1 F (36.7 C) 97.7 F (36.5 C) 97.6 F (36.4 C) 98.2 F (36.8 C)  TempSrc: Oral Oral Oral Oral  SpO2: 99% 98% 99% 100%  Weight:      Height:        Intake/Output Summary (Last 24 hours) at 01/11/2023 0851 Last data filed at 01/11/2023 0800 Gross per 24 hour  Intake 480 ml  Output 1800 ml  Net -1320 ml   Filed Weights   01/07/23 1039  Weight: 86.2 kg    Physical Exam    GEN- The patient is well appearing, alert and oriented x 3 today.   Lungs- Clear to ausculation bilaterally, normal work of breathing Cardiac- Regular rate and rhythm, no murmurs, rubs or gallops GI- soft, NT, ND, + BS Extremities- no clubbing or cyanosis. No edema  Telemetry    SR 60-80's (personally reviewed)  Studies:  ECHO 01/10/23 > LVEF 55-60%, no RWMA, G1DD, RV systolic normal  Hospital Course    [name] is a 77 y.o. female with PMH of HTN, DM, GERD, depression, dementia admitted 01/07/23 with diarrheal illness, hypovolemia.  Prior to anticipated discharge, she was noted to have AF which was new for her. Started on amiodarone. Converted 12/15 345pm to SR.   Assessment & Plan    Atrial Fibrillation  New dx 12/2022 for patient. CHA2DS2-VASc 5.  -continue OAC for stroke prophylaxis  -diltiazem 240mg  daily  -amiodarone 400mg  x5 days, then 200 mg daily  -conduction system disease limits antiarrhythmic options  -follow up arranged for [facility]  -she has depression and dementia, anticipate she will need help with her medications at  home at discharge, discussed with her son in detail   RBBB, LAFB -noted on prior EKG's, limits ability AAD 1C agents      Plan of care reviewed with Dr. Randol Kern.  For questions or updates, please contact [facility] Please consult [email or web address] for contact info under Cardiology/STEMI.  Signed, Canary Brim, MSN, APRN, NP-C, AGACNP-BC [facility] - Electrophysiology  01/11/2023, 8:55 AM

## 2023-01-11 NOTE — Plan of Care (Signed)
  Problem: Health Behavior/Discharge Planning: Goal: Ability to manage health-related needs will improve Outcome: Progressing   Problem: Activity: Goal: Risk for activity intolerance will decrease Outcome: Progressing   Problem: Safety: Goal: Ability to remain free from injury will improve Outcome: Progressing   

## 2023-01-11 NOTE — Discharge Summary (Signed)
Physician Discharge Summary  [name]:[id number] DOB: 1945-05-05 DOA: 01/07/2023  PCP: Anabel Halon, MD  Admit date: 01/07/2023 Discharge date: 01/11/2023  Admitted From: (Home) Disposition:  (Home )  Recommendations for Outpatient Follow-up:  Follow up with PCP in 1-2 weeks Please obtain BMP/CBC in one week   Home Health: (YES)    CODE STATUS: (FULL) Diet recommendation: Heart Healthy / Carb Modified  Brief/Interim Summary: [name] is a 77 y.o. female with medical history significant of diabetes, hypertension, GERD, hypothyroidism, depression, dementia.  Patient presents with weakness over the last 1 week.  No she is able to provide some history, her son provides history as well.  She began calling her family members to tell him that she has been sick with a GI bug since last week.  She had episodes of diarrhea with gas and dyspepsia.  Her family go her Pepto-Bismol and Imodium for the diarrhea, however she did not take much of it due to her dementia.  She normally lives alone and her family members call and check in on her physically.  Due to the diarrhea, her appetite has been mildly decreased.  She has been having some fevers and chills.  She had some mild dysuria, frequency.  Her son states that she does get confused at times due to the dementia      Parox A-fib -Patient has developed paroxysmal A-fib during hospital stay, EP consult greatly appreciated, CHA2DS2-VASc score is 5, so she was started on Eliquis after discussing with her son regarding risks and benefits -Condition by EP to continue home dose Cardizem, started on amiodarone 400 mg oral p.o. daily x 5 days, and then 200 mg oral daily -2D echo with a preserved EF, no regional wall motion abnormalities     Dehydration  Hyponatremia -Clinically dehydrated presentation, treated with IV fluids, much improved  RBBB, LAFB -noted on prior EKG's, limits ability AAD 1C agents    Hypokalemia Hypophosphatemia -Replaced -Monitor and replace as needed   Acute  metabolic encephalopathy - Possibly secondary to dehydration and hyponatremia - UA appears to be normal.  - improving as discussed with son at bedside as well she is with underlying dementia -She is back to baseline   Dementia - Continue Aricept   Diabetes type II, controlled -Was on SSI -Holding metformin during hospital stay, resume metformin on discharge .   Lactic acidosis Bolick acidosis -Received IV fluids, metformin has been held during hospital stay, as well she was treated with bicarb  -On bicarb supplement, hold metformin and continue with IV fluids -Continue with bicarb   GERD -Continue with PPI   Hypothyroidism -Continue with Synthroid   Hypertension -Continue with home meds       Discharge Diagnoses:  Principal Problem:   Hyponatremia Active Problems:   Essential hypertension   Gastroesophageal reflux disease   Diabetes mellitus (HCC)   Dementia   OSA (obstructive sleep apnea)   Hypokalemia    Discharge Instructions  Discharge Instructions     Diet - low sodium heart healthy   Complete by: As directed    Discharge instructions   Complete by: As directed    Follow with Primary MD Anabel Halon, MD in 7 days   Get CBC, CMP, checked  by Primary MD next visit.    Activity: As tolerated with Full fall precautions use walker/cane & assistance as needed   Disposition Home    Diet: Heart Healthy/carb modified   On your next visit  with your primary care physician please Get Medicines reviewed and adjusted.   Please request your Prim.MD to go over all Hospital Tests and Procedure/Radiological results at the follow up, please get all Hospital records sent to your Prim MD by signing hospital release before you go home.   If you experience worsening of your admission symptoms, develop shortness of breath, life threatening emergency, suicidal or homicidal  thoughts you must seek medical attention immediately by calling 911 or calling your MD immediately  if symptoms less severe.  You Must read complete instructions/literature along with all the possible adverse reactions/side effects for all the Medicines you take and that have been prescribed to you. Take any new Medicines after you have completely understood and accpet all the possible adverse reactions/side effects.   Do not drive, operating heavy machinery, perform activities at heights, swimming or participation in water activities or provide baby sitting services if your were admitted for syncope or siezures until you have seen by Primary MD or a Neurologist and advised to do so again.  Do not drive when taking Pain medications.    Do not take more than prescribed Pain, Sleep and Anxiety Medications  Special Instructions: If you have smoked or chewed Tobacco  in the last 2 yrs please stop smoking, stop any regular Alcohol  and or any Recreational drug use.  Wear Seat belts while driving.   Please note  You were cared for by a hospitalist during your hospital stay. If you have any questions about your discharge medications or the care you received while you were in the hospital after you are discharged, you can call the unit and asked to speak with the hospitalist on call if the hospitalist that took care of you is not available. Once you are discharged, your primary care physician will handle any further medical issues. Please note that NO REFILLS for any discharge medications will be authorized once you are discharged, as it is imperative that you return to your primary care physician (or establish a relationship with a primary care physician if you do not have one) for your aftercare needs so that they can reassess your need for medications and monitor your lab values.   Increase activity slowly   Complete by: As directed       Allergies as of 01/11/2023       Reactions    Amoxicillin-pot Clavulanate Rash   Atenolol Rash   Penicillins Rash   Tolerated Cephalosporin Date: 02/17/21.   Sulfonamide Derivatives Rash        Medication List     STOP taking these medications    losartan 50 MG tablet Commonly known as: COZAAR   pantoprazole 40 MG tablet Commonly known as: PROTONIX       TAKE these medications    acetaminophen 500 MG tablet Commonly known as: TYLENOL Take 1,000 mg by mouth every 6 (six) hours as needed for moderate pain or headache.   albuterol 108 (90 Base) MCG/ACT inhaler Commonly known as: VENTOLIN HFA Inhale 2 puffs into the lungs every 6 (six) hours as needed for wheezing or shortness of breath.   alendronate 70 MG tablet Commonly known as: FOSAMAX TAKE 1 TABLET EVERY WEEK IN THE MORNING 30 MIN BEFORE EATING WITH AN 8OZ GLASS OF WATER (SIT UP 30 MIN)   amiodarone 200 MG tablet Commonly known as: PACERONE Please take 2 tablets (400 mg) oral daily for 5 days, then 1 tablet (200 mg) oral daily after that Start taking  on: January 12, 2023   apixaban 5 MG Tabs tablet Commonly known as: ELIQUIS Take 1 tablet (5 mg total) by mouth 2 (two) times daily.   atorvastatin 80 MG tablet Commonly known as: LIPITOR Take 1 tablet (80 mg total) by mouth daily.   blood glucose meter kit and supplies Kit Dispense based on patient and insurance preference. Use up to four times daily as directed.   buPROPion 300 MG 24 hr tablet Commonly known as: WELLBUTRIN XL Take 300 mg by mouth daily.   clonazePAM 0.5 MG tablet Commonly known as: KLONOPIN Take 0.25-0.5 mg by mouth in the morning, at noon, and at bedtime.   Dexilant 60 MG capsule Generic drug: dexlansoprazole Take 1 capsule by mouth daily.   diltiazem 240 MG 24 hr capsule Commonly known as: CARDIZEM CD TAKE (1) CAPSULE BY MOUTH ONCE DAILY.   donepezil 5 MG tablet Commonly known as: ARICEPT TAKE (1) TABLET BY MOUTH AT BEDTIME.   DULoxetine 20 MG capsule Commonly known  as: CYMBALTA Take 20 mg by mouth 2 (two) times daily.   levothyroxine 125 MCG tablet Commonly known as: SYNTHROID Take 1 tablet (125 mcg total) by mouth daily before breakfast.   metFORMIN 500 MG tablet Commonly known as: GLUCOPHAGE Take 1 tablet (500 mg total) by mouth 2 (two) times daily with a meal.   mirabegron ER 25 MG Tb24 tablet Commonly known as: Myrbetriq Take 1 tablet (25 mg total) by mouth daily.   ondansetron 4 MG tablet Commonly known as: Zofran Take 1 tablet (4 mg total) by mouth every 8 (eight) hours as needed for nausea or vomiting.   OneTouch Delica Plus Lancet33G Misc USE TO TEST BLOOD SUGAR TWICE DAILY.   OneTouch Ultra Test test strip Generic drug: glucose blood USE TO TEST BLOOD SUGAR TWICE DAILY.   topiramate 50 MG tablet Commonly known as: TOPAMAX Take 50 mg by mouth at bedtime.               Durable Medical Equipment  (From admission, onward)           Start     Ordered   01/11/23 1102  For home use only DME Bedside commode  Once       Question:  Patient needs a bedside commode to treat with the following condition  Answer:  Acute metabolic encephalopathy   01/11/23 1101   01/10/23 1544  For home use only DME Walker rolling  Once       Question Answer Comment  Walker: With 5 Inch Wheels   Patient needs a walker to treat with the following condition DM (diabetes mellitus) (HCC)   Patient needs a walker to treat with the following condition Dementia (HCC)      01/10/23 1544            Follow-up Information     [facility] Follow up.   Specialty: Home Health Services Why: Frances [facility] will contact you within 48 hours of discharge to home to schedule home health visit Contact information: [address] [place] [address] [phone]         [facility] Follow up.   Why: [facility] has provided you with the rolling walker Contact information: [address]  [address]  [place]  [address]  [phone]               Allergies  Allergen Reactions   Amoxicillin-Pot Clavulanate Rash   Atenolol Rash   Penicillins Rash  Tolerated Cephalosporin Date: 02/17/21.     Sulfonamide Derivatives Rash    Consultations: EP Cardiology   Procedures/Studies: ECHOCARDIOGRAM COMPLETE Result Date: 01/10/2023    ECHOCARDIOGRAM REPORT   Patient Name:   [name] Date of Exam: 01/10/2023 Medical Rec #:  [id number]        Height:       62.0 in Accession #:    [id number]       Weight:       190.0 lb Date of Birth:  Mar 25, 1945        BSA:          1.870 m Patient Age:    77 years         BP:           142/51 mmHg Patient Gender: F                HR:           63 bpm. Exam Location:  Inpatient Procedure: 2D Echo, Cardiac Doppler and Color Doppler Indications:    Atrial Fibrilation I48.91  History:        Patient has prior history of Echocardiogram examinations, most                 recent 07/02/2019. Arrythmias:Tachycardia, RBBB and Atrial                 Flutter, Signs/Symptoms:Dyspnea; Risk Factors:Hypertension,                 Diabetes, Dyslipidemia and Sleep Apnea.  Sonographer:    Lucendia Herrlich RCS Referring Phys: 39 Ladawna Walgren S Jeovany Huitron IMPRESSIONS  1. Left ventricular ejection fraction, by estimation, is 55 to 60%. The left ventricle has normal function. The left ventricle has no regional wall motion abnormalities. There is mild concentric left ventricular hypertrophy. Left ventricular diastolic parameters are consistent with Grade I diastolic dysfunction (impaired relaxation).  2. Right ventricular systolic function is normal. The right ventricular size is mildly enlarged. There is normal pulmonary artery systolic pressure. The estimated right ventricular systolic pressure is 27.8 mmHg.  3. The mitral valve is normal in structure. No evidence of mitral valve regurgitation. No evidence of mitral stenosis.  4. The aortic valve is tricuspid. Aortic valve regurgitation is not  visualized. No aortic stenosis is present.  5. The inferior vena cava is normal in size with greater than 50% respiratory variability, suggesting right atrial pressure of 3 mmHg. FINDINGS  Left Ventricle: Left ventricular ejection fraction, by estimation, is 55 to 60%. The left ventricle has normal function. The left ventricle has no regional wall motion abnormalities. The left ventricular internal cavity size was normal in size. There is  mild concentric left ventricular hypertrophy. Left ventricular diastolic parameters are consistent with Grade I diastolic dysfunction (impaired relaxation). Right Ventricle: The right ventricular size is mildly enlarged. No increase in right ventricular wall thickness. Right ventricular systolic function is normal. There is normal pulmonary artery systolic pressure. The tricuspid regurgitant velocity is 2.49  m/s, and with an assumed right atrial pressure of 3 mmHg, the estimated right ventricular systolic pressure is 27.8 mmHg. Left Atrium: Left atrial size was normal in size. Right Atrium: Right atrial size was normal in size. Pericardium: Trivial pericardial effusion is present. Mitral Valve: The mitral valve is normal in structure. No evidence of mitral valve regurgitation. No evidence of mitral valve stenosis. Tricuspid Valve: The tricuspid valve is normal in structure. Tricuspid valve regurgitation is trivial. Aortic Valve: The  aortic valve is tricuspid. Aortic valve regurgitation is not visualized. No aortic stenosis is present. Aortic valve peak gradient measures 7.2 mmHg. Pulmonic Valve: The pulmonic valve was normal in structure. Pulmonic valve regurgitation is not visualized. Aorta: The aortic root is normal in size and structure. Venous: The inferior vena cava is normal in size with greater than 50% respiratory variability, suggesting right atrial pressure of 3 mmHg. IAS/Shunts: No atrial level shunt detected by color flow Doppler.  LEFT VENTRICLE PLAX 2D LVIDd:          5.00 cm   Diastology LVIDs:         3.10 cm   LV e' medial:    11.00 cm/s LV PW:         1.10 cm   LV E/e' medial:  6.7 LV IVS:        0.70 cm   LV e' lateral:   10.40 cm/s LVOT diam:     2.20 cm   LV E/e' lateral: 7.1 LV SV:         67 LV SV Index:   36 LVOT Area:     3.80 cm  RIGHT VENTRICLE             IVC RV S prime:     14.30 cm/s  IVC diam: 1.40 cm TAPSE (M-mode): 2.3 cm LEFT ATRIUM           Index        RIGHT ATRIUM           Index LA diam:      3.80 cm 2.03 cm/m   RA Area:     16.00 cm LA Vol (A4C): 52.6 ml 28.09 ml/m  RA Volume:   40.60 ml  21.71 ml/m  AORTIC VALVE AV Area (Vmax): 2.58 cm AV Vmax:        134.00 cm/s AV Peak Grad:   7.2 mmHg LVOT Vmax:      90.90 cm/s LVOT Vmean:     56.750 cm/s LVOT VTI:       0.177 m  AORTA Ao Root diam: 3.10 cm Ao Asc diam:  2.90 cm MITRAL VALVE               TRICUSPID VALVE MV Area (PHT): 4.49 cm    TR Peak grad:   24.8 mmHg MV Decel Time: 169 msec    TR Vmax:        249.00 cm/s MV E velocity: 74.20 cm/s MV A velocity: 76.40 cm/s  SHUNTS MV E/A ratio:  0.97        Systemic VTI:  0.18 m                            Systemic Diam: 2.20 cm Dalton McleanMD Electronically signed by Wilfred Lacy Signature Date/Time: 01/10/2023/4:51:06 PM    Final    CT ABDOMEN PELVIS W CONTRAST Result Date: 01/07/2023 CLINICAL DATA:  Fall at home. Blunt trauma. Generalized abdominal pain. Weakness. EXAM: CT ABDOMEN AND PELVIS WITH CONTRAST TECHNIQUE: Multidetector CT imaging of the abdomen and pelvis was performed using the standard protocol following bolus administration of intravenous contrast. RADIATION DOSE REDUCTION: This exam was performed according to the departmental dose-optimization program which includes automated exposure control, adjustment of the mA and/or kV according to patient size and/or use of iterative reconstruction technique. CONTRAST:  OMNIPAQUE IOHEXOL 300 MG/ML  SOLN COMPARISON:  05/05/2012 FINDINGS: Lower chest:  Unremarkable. Hepatobiliary: No  hepatic laceration or mass identified. Prior cholecystectomy again noted. Increased diffuse biliary ductal dilatation is seen since prior study, however, no obstructing etiology is apparent by CT. Pancreas: No parenchymal laceration, mass, or inflammatory changes identified. No evidence of pancreatic ductal dilatation. Spleen: No evidence of splenic laceration. Adrenal/Urinary Tract: No hemorrhage or parenchymal lacerations identified. No evidence of suspicious renal masses or hydronephrosis. Unremarkable unopacified urinary bladder. Stomach/Bowel: Unopacified bowel loops are unremarkable in appearance. No evidence of hemoperitoneum. Diverticulosis is seen mainly involving the sigmoid colon, however there is no evidence of diverticulitis. Vascular/Lymphatic: No evidence of abdominal aortic injury. No pathologically enlarged lymph nodes identified. Reproductive:  No mass or other significant abnormality identified. Other:  None. Musculoskeletal: No acute fractures or suspicious bone lesions identified. IMPRESSION: No evidence of traumatic injury within the abdomen or pelvis. Prior cholecystectomy. Increased diffuse biliary ductal dilatation since prior study, however, no obstructing etiology is apparent by CT. Recommend correlation with liver function tests, and consider MRCP for further evaluation. Colonic diverticulosis, without radiographic evidence of diverticulitis. Electronically Signed   By: Danae Orleans M.D.   On: 01/07/2023 17:42   DG Chest Port 1 View Result Date: 01/07/2023 CLINICAL DATA:  77 year old female with possible sepsis. EXAM: PORTABLE CHEST 1 VIEW COMPARISON:  Portable chest 11/05/2019. FINDINGS: Portable AP view at elevn 50 hours. Lung volumes and mediastinal contours remain normal. Visualized tracheal air column is within normal limits. Allowing for portable technique the lungs are clear. No pneumothorax or pleural effusion. Stable cholecystectomy clips. Small chronic rim calcified splenic  lesion in the left upper quadrant, stable from a 2014 CT Abdomen and Pelvis negative visible bowel gas. No acute osseous abnormality identified. IMPRESSION: No acute cardiopulmonary abnormality. Electronically Signed   By: Odessa Fleming M.D.   On: 01/07/2023 13:06   CT Head Wo Contrast Result Date: 01/07/2023 CLINICAL DATA:  Patient found on floor. Trauma. Patient is unsure if she lost consciousness. EXAM: CT HEAD WITHOUT CONTRAST TECHNIQUE: Contiguous axial images were obtained from the base of the skull through the vertex without intravenous contrast. RADIATION DOSE REDUCTION: This exam was performed according to the departmental dose-optimization program which includes automated exposure control, adjustment of the mA and/or kV according to patient size and/or use of iterative reconstruction technique. COMPARISON:  CT head without contrast 07/04/2014. MR head 11/14/2019 FINDINGS: Brain: Moderate generalized atrophy and white matter disease demonstrates continued progression. No acute infarct, hemorrhage, or mass lesion is present. The ventricles are proportionate to the degree of atrophy. No significant extraaxial fluid collection is present. The brainstem and cerebellum are within normal limits. Midline structures are within normal limits. Vascular: Atherosclerotic calcifications are present within the cavernous internal carotid arteries bilaterally. No hyperdense vessel is present. Skull: Calvarium is intact. No focal lytic or blastic lesions are present. No significant extracranial soft tissue lesion is present. Sinuses/Orbits: Posterior right ethmoid air cell is partially opacified. The paranasal sinuses and mastoid air cells are otherwise clear. Bilateral lens replacements are noted. Globes and orbits are otherwise unremarkable. IMPRESSION: 1. No acute intracranial abnormality or significant interval change. 2. Moderate generalized atrophy and white matter disease demonstrates continued progression. This likely  reflects the sequela of chronic microvascular ischemia. Electronically Signed   By: Marin Roberts M.D.   On: 01/07/2023 12:55      Subjective:  No significant events overnight as discussed with staff, she denies any complaints, she has good appetite, no chest pain, no shortness of breath Discharge Exam: Vitals:   01/11/23 0753 01/11/23 1135  BP: (!) 140/74 (!) 142/49  Pulse: (!) 59 (!) 58  Resp: 19 16  Temp: 98.2 F (36.8 C) 98 F (36.7 C)  SpO2: 100% 97%   Vitals:   01/10/23 2332 01/11/23 0504 01/11/23 0753 01/11/23 1135  BP: (!) 158/73  (!) 140/74 (!) 142/49  Pulse: 65 67 (!) 59 (!) 58  Resp: 16 17 19 16   Temp: 97.7 F (36.5 C) 97.6 F (36.4 C) 98.2 F (36.8 C) 98 F (36.7 C)  TempSrc: Oral Oral Oral Oral  SpO2: 98% 99% 100% 97%  Weight:      Height:        General: Pt is alert, awake, not in acute distress, pleasant, some mild dementia at baseline Cardiovascular: RRR, S1/S2 +, no rubs, no gallops Respiratory: CTA bilaterally, no wheezing, no rhonchi Abdominal: Soft, NT, ND, bowel sounds + Extremities: no edema, no cyanosis    The results of significant diagnostics from this hospitalization (including imaging, microbiology, ancillary and laboratory) are listed below for reference.     Microbiology: Recent Results (from the past 240 hours)  Blood Culture (routine x 2)     Status: None (Preliminary result)   Collection Time: 01/07/23 11:09 AM   Specimen: BLOOD  Result Value Ref Range Status   Specimen Description BLOOD BLOOD RIGHT ARM  Final   Special Requests   Final    Blood Culture results may not be optimal due to an inadequate volume of blood received in culture bottles RAC   Culture   Final    NO GROWTH 4 DAYS Performed at [facility], [address], [place], [place] [address]    Report Status PENDING  Incomplete  Blood Culture (routine x 2)     Status: None (Preliminary result)   Collection Time: 01/07/23 11:32 AM   Specimen: BLOOD  Result  Value Ref Range Status   Specimen Description BLOOD BLOOD RIGHT HAND  Final   Special Requests   Final    BOTTLES DRAWN AEROBIC ONLY Blood Culture results may not be optimal due to an inadequate volume of blood received in culture bottles   Culture   Final    NO GROWTH 4 DAYS Performed at [facility], [address] [place], [place] [address]    Report Status PENDING  Incomplete  Urine Culture     Status: None   Collection Time: 01/07/23  6:02 PM   Specimen: Urine, Clean Catch  Result Value Ref Range Status   Specimen Description   Final    URINE, CLEAN CATCH Performed at [facility], [address], [place], [place] [address]    Special Requests   Final    NONE Performed at [facility], [address], [place], [place] [address]    Culture   Final    NO GROWTH Performed at [facility], [address] [place], [place] [address]    Report Status 01/09/2023 FINAL  Final     Labs: BNP (last 3 results) No results for input(s): "BNP" in the last 8760 hours. Basic Metabolic Panel: Recent Labs  Lab 01/07/23 1109 01/07/23 1445 01/08/23 0457 01/09/23 0558 01/10/23 0745 01/11/23 0131  NA 128*  --  130* 132* 130* 129*  K 3.2*  --  3.6 4.0 3.4* 3.8  CL 95*  --  103 103 96* 97*  CO2 23  --  18* 20* 24 21*  GLUCOSE 122*  --  86 127* 136* 120*  BUN 7*  --  <5* <5* <  5* <5*  CREATININE 0.57  --  0.42* 0.59 0.46 0.45  CALCIUM 8.9  --  7.9* 8.6* 8.6* 8.7*  MG  --  2.1  --  1.7 1.9  --   PHOS  --   --   --  2.1* 3.2  --    Liver Function Tests: Recent Labs  Lab 01/07/23 1109  AST 39  ALT 66*  ALKPHOS 142*  BILITOT 0.8  PROT 6.5  ALBUMIN 3.6   No results for input(s): "LIPASE", "AMYLASE" in the last 168 hours. No results for input(s): "AMMONIA" in the last 168 hours. CBC: Recent Labs  Lab 01/07/23 1109 01/09/23 0558 01/10/23 0745  WBC 12.0* 9.9 9.8  NEUTROABS 10.2*  --   --   HGB 13.5 13.0 13.0  HCT 38.1 36.4 36.2  MCV 92.5 90.1 89.6  PLT 351 349 356    Cardiac Enzymes: Recent Labs  Lab 01/07/23 1109  CKTOTAL 162   BNP: Invalid input(s): "POCBNP" CBG: Recent Labs  Lab 01/10/23 1212 01/10/23 1719 01/10/23 2018 01/11/23 0757 01/11/23 1131  GLUCAP 144* 194* 119* 151* 136*   D-Dimer No results for input(s): "DDIMER" in the last 72 hours. Hgb A1c No results for input(s): "HGBA1C" in the last 72 hours. Lipid Profile No results for input(s): "CHOL", "HDL", "LDLCALC", "TRIG", "CHOLHDL", "LDLDIRECT" in the last 72 hours. Thyroid function studies No results for input(s): "TSH", "T4TOTAL", "T3FREE", "THYROIDAB" in the last 72 hours.  Invalid input(s): "FREET3" Anemia work up No results for input(s): "VITAMINB12", "FOLATE", "FERRITIN", "TIBC", "IRON", "RETICCTPCT" in the last 72 hours. Urinalysis    Component Value Date/Time   COLORURINE YELLOW 01/07/2023 1214   APPEARANCEUR CLEAR 01/07/2023 1214   APPEARANCEUR Clear 12/03/2020 1347   LABSPEC 1.016 01/07/2023 1214   PHURINE 7.0 01/07/2023 1214   GLUCOSEU NEGATIVE 01/07/2023 1214   HGBUR NEGATIVE 01/07/2023 1214   BILIRUBINUR NEGATIVE 01/07/2023 1214   BILIRUBINUR negative 12/03/2022 1335   BILIRUBINUR Negative 12/03/2020 1347   KETONESUR 5 (A) 01/07/2023 1214   PROTEINUR 30 (A) 01/07/2023 1214   UROBILINOGEN 0.2 12/03/2022 1335   UROBILINOGEN 0.2 07/04/2014 1922   NITRITE NEGATIVE 01/07/2023 1214   LEUKOCYTESUR NEGATIVE 01/07/2023 1214   Sepsis Labs Recent Labs  Lab 01/07/23 1109 01/09/23 0558 01/10/23 0745  WBC 12.0* 9.9 9.8   Microbiology Recent Results (from the past 240 hours)  Blood Culture (routine x 2)     Status: None (Preliminary result)   Collection Time: 01/07/23 11:09 AM   Specimen: BLOOD  Result Value Ref Range Status   Specimen Description BLOOD BLOOD RIGHT ARM  Final   Special Requests   Final    Blood Culture results may not be optimal due to an inadequate volume of blood received in culture bottles RAC   Culture   Final    NO GROWTH 4  DAYS Performed at [facility], [address] [place], [place] [address]    Report Status PENDING  Incomplete  Blood Culture (routine x 2)     Status: None (Preliminary result)   Collection Time: 01/07/23 11:32 AM   Specimen: BLOOD  Result Value Ref Range Status   Specimen Description BLOOD BLOOD RIGHT HAND  Final   Special Requests   Final    BOTTLES DRAWN AEROBIC ONLY Blood Culture results may not be optimal due to an inadequate volume of blood received in culture bottles   Culture   Final    NO GROWTH 4 DAYS Performed at [facility], [address], [place], [place] [address]    Report Status PENDING  Incomplete  Urine Culture     Status: None   Collection Time: 01/07/23  6:02 PM   Specimen: Urine, Clean Catch  Result Value Ref Range Status   Specimen Description   Final    URINE, CLEAN CATCH Performed at [facility], [address] [place], [place] [address]    Special Requests   Final    NONE Performed at [facility], [address] [place], [place] [address]    Culture   Final    NO GROWTH Performed at [facility], [address], [place], [place] [address]    Report Status 01/09/2023 FINAL  Final     Time coordinating discharge: Over 30 minutes  SIGNED:   Huey Bienenstock, MD  [facility] 01/11/2023, 12:21 PM Pager   If 7PM-7AM, please contact night-coverage [email or web address] Password [email or web address]

## 2023-01-11 NOTE — Progress Notes (Signed)
Physical Therapy Treatment Patient Details Name: [name] MRN: [id number] DOB: [birth year] Today's Date: 01/11/2023   History of Present Illness Pt is 77 yo presenting to [facility] ED with weakness over the last week. Pt had diarrhea with gas and dyspepsia. She had some mild dysuria with frequency. Pt lives alone. PMH: DM, HTN, GERD, hypothyroidism, depression, dementia.    PT Comments  Pt received sitting in the recliner and agreeable to session. Pt demonstrates slightly increased confusion this session and is initially anxious about the location of her diabetes kit at home and reports not remembering where she keeps it, RN notified. Pt eager for ambulation and demonstrates fair stability with light UE support on RW. Pt able to perform x5 serial STS from recliner without UE support and no LOB, however then reports increased fatigue. Pt continues to benefit from PT services to progress toward functional mobility goals.     If plan is discharge home, recommend the following: Supervision due to cognitive status;Help with stairs or ramp for entrance;Assist for transportation   Can travel by private vehicle        Equipment Recommendations  Rolling walker (2 wheels);BSC/3in1    Recommendations for Other Services       Precautions / Restrictions Precautions Precautions: Fall Restrictions Weight Bearing Restrictions Per Provider Order: No     Mobility  Bed Mobility               General bed mobility comments: Pt in recliner upon arrival and departure    Transfers Overall transfer level: Needs assistance Equipment used: Rolling walker (2 wheels) Transfers: Sit to/from Stand Sit to Stand: Supervision           General transfer comment: from recliner with and without UE support with no LOB    Ambulation/Gait Ambulation/Gait assistance: Supervision Gait Distance (Feet): 170 Feet Assistive device: Rolling walker (2 wheels) Gait Pattern/deviations: Step-through pattern,  Decreased stride length       General Gait Details: cues for RW proximity and upright posture. Slightly increased instability during turns, but pt able to correct without assist       Balance Overall balance assessment: Mild deficits observed, not formally tested                                          Cognition Arousal: Alert Behavior During Therapy: [facility] for tasks assessed/performed Overall Cognitive Status: History of cognitive impairments - at baseline                                          Exercises Other Exercises Other Exercises: x5 serial STS without UE support    General Comments        Pertinent Vitals/Pain Pain Assessment Pain Assessment: No/denies pain     PT Goals (current goals can now be found in the care plan section) Acute Rehab PT Goals Patient Stated Goal: to return home PT Goal Formulation: With patient Time For Goal Achievement: 01/23/23 Progress towards PT goals: Progressing toward goals    Frequency    Min 1X/week       AM-PAC PT "6 Clicks" Mobility   Outcome Measure  Help needed turning from your back to your side while in a flat bed without using bedrails?: A Little Help needed moving from  lying on your back to sitting on the side of a flat bed without using bedrails?: A Little Help needed moving to and from a bed to a chair (including a wheelchair)?: A Little Help needed standing up from a chair using your arms (e.g., wheelchair or bedside chair)?: A Little Help needed to walk in hospital room?: A Little Help needed climbing 3-5 steps with a railing? : A Little 6 Click Score: 18    End of Session Equipment Utilized During Treatment: Gait belt Activity Tolerance: Patient tolerated treatment well Patient left: in chair;with call bell/phone within reach;with chair alarm set Nurse Communication: Mobility status PT Visit Diagnosis: Unsteadiness on feet (R26.81);Other abnormalities of gait and  mobility (R26.89)     Time: 4098-1191 PT Time Calculation (min) (ACUTE ONLY): 22 min  Charges:    $Gait Training: 8-22 mins PT General Charges $$ ACUTE PT VISIT: 1 Visit                     Johny Shock, PTA Acute Rehabilitation Services Secure Chat Preferred  [phone]    Johny Shock 01/11/2023, 12:18 PM

## 2023-01-11 NOTE — TOC Transition Note (Addendum)
Transition of Care [facility]) - Discharge Note   Patient Details  Name: [name] MRN: [id number] Date of Birth: September 08, 1945  Transition of Care [facility]) CM/SW Contact:  Gordy Clement, RN Phone Number: 01/11/2023, 11:02 AM   Clinical Narrative:     Patient to DC to home today. RW and BSC ordered  just waiting on BSC from [employer].  Home health will be provided by [facility]. AVS updated. Family to transport home.           Patient Goals and CMS Choice            Discharge Placement                       Discharge Plan and Services Additional resources added to the After Visit Summary for                                       Social Drivers of Health (SDOH) Interventions SDOH Screenings   Food Insecurity: No Food Insecurity (01/07/2023)  Housing: Low Risk  (01/07/2023)  Transportation Needs: No Transportation Needs (01/07/2023)  Utilities: Not At Risk (01/07/2023)  Alcohol Screen: Low Risk  (02/13/2021)  Depression (PHQ2-9): High Risk (10/06/2022)  Financial Resource Strain: Low Risk  (02/13/2021)  Physical Activity: Inactive (02/13/2021)  Social Connections: Unknown (06/01/2021)   Received from [facility]  Stress: No Stress Concern Present (02/13/2021)  Tobacco Use: Medium Risk (01/07/2023)     Readmission Risk Interventions     No data to display

## 2023-01-12 ENCOUNTER — Telehealth: Payer: Self-pay

## 2023-01-12 LAB — CULTURE, BLOOD (ROUTINE X 2)
Culture: NO GROWTH
Culture: NO GROWTH

## 2023-01-12 NOTE — Transitions of Care (Post Inpatient/ED Visit) (Signed)
01/12/2023  Name: [name] MRN: [id number] DOB: 1945-02-06  Today's TOC FU Call Status: Today's TOC FU Call Status:: Successful TOC FU Call Completed TOC FU Call Complete Date: 01/12/23 Patient's Name and Date of Birth confirmed.  Transition Care Management Follow-up Telephone Call Date of Discharge: 01/11/23 Discharge Facility: [facility]) Type of Discharge: Inpatient Admission Primary Inpatient Discharge Diagnosis:: Hyponatremia How have you been since you were released from the hospital?: Better Any questions or concerns?: No  Items Reviewed: Did you receive and understand the discharge instructions provided?: Yes Medications obtained,verified, and reconciled?: Yes (Medications Reviewed) Any new allergies since your discharge?: No Dietary orders reviewed?: Yes Type of Diet Ordered:: low salt, heart healthy diet, carb modified Do you have support at home?: No (lives alone.  Son checks on patient throughout the day.)  Medications Reviewed Today: Medications Reviewed Today     Reviewed by Earlie Server, RN (Registered Nurse) on 01/12/23 at 1032  Med List Status: <None>   Medication Order Taking? Sig Documenting Provider Last Dose Status Informant  acetaminophen (TYLENOL) 500 MG tablet [id number] Yes Take 1,000 mg by mouth every 6 (six) hours as needed for moderate pain or headache. [provider] Taking Active Self  albuterol (VENTOLIN HFA) 108 (90 Base) MCG/ACT inhaler [id number] Yes Inhale 2 puffs into the lungs every 6 (six) hours as needed for wheezing or shortness of breath. Heather Roberts, NP Taking Active Self  alendronate (FOSAMAX) 70 MG tablet [id number] Yes TAKE 1 TABLET EVERY WEEK IN THE MORNING 30 MIN BEFORE EATING WITH AN 8OZ GLASS OF WATER (SIT UP 30 MIN) Anabel Halon, MD Taking Active Self           Med Note Sharlene Dory   Wed Apr 21, 2022 11:07 AM) Sunday  amiodarone (PACERONE) 200 MG tablet [id number] Yes Please take 2 tablets (400  mg) oral daily for 5 days, then 1 tablet (200 mg) oral daily after that Elgergawy, Leana Roe, MD Taking Active   apixaban (ELIQUIS) 5 MG TABS tablet [id number] Yes Take 1 tablet (5 mg total) by mouth 2 (two) times daily. Elgergawy, Leana Roe, MD Taking Active   atorvastatin (LIPITOR) 80 MG tablet [id number] Yes Take 1 tablet (80 mg total) by mouth daily. Anabel Halon, MD Taking Active Self  blood glucose meter kit and supplies KIT [id number] Yes Dispense based on patient and insurance preference. Use up to four times daily as directed. Anabel Halon, MD Taking Active Self  buPROPion (WELLBUTRIN XL) 300 MG 24 hr tablet [id number] Yes Take 300 mg by mouth daily.  [provider] Taking Active Self           Med Note Ewell Poe   Thu Jul 04, 2014  7:17 PM)     clonazePAM (KLONOPIN) 0.5 MG tablet [id number] Yes Take 0.25-0.5 mg by mouth in the morning, at noon, and at bedtime.  Taking Active Self  DEXILANT 60 MG capsule [id number] Yes Take 1 capsule by mouth daily. [provider] Taking Active Self  diltiazem (CARDIZEM CD) 240 MG 24 hr capsule [id number] Yes TAKE (1) CAPSULE BY MOUTH ONCE DAILY. Jonelle Sidle, MD Taking Active Self  donepezil (ARICEPT) 5 MG tablet [id number] Yes TAKE (1) TABLET BY MOUTH AT BEDTIME. Anabel Halon, MD Taking Active Self  DULoxetine (CYMBALTA) 20 MG capsule [id number] Yes Take 20 mg by mouth 2 (two) times daily. [provider] Taking Active Self  Lancets [facility] DELICA PLUS [place])  MISC [id number] Yes USE TO TEST BLOOD SUGAR TWICE DAILY. Anabel Halon, MD Taking Active Self  levothyroxine (SYNTHROID) 125 MCG tablet [id number] Yes Take 1 tablet (125 mcg total) by mouth daily before breakfast. Dani Gobble, NP Taking Active Self  metFORMIN (GLUCOPHAGE) 500 MG tablet [id number] Yes Take 1 tablet (500 mg total) by mouth 2 (two) times daily with a meal. Anabel Halon, MD Taking Active Self  mirabegron ER (MYRBETRIQ) 25 MG TB24  tablet [id number] Yes Take 1 tablet (25 mg total) by mouth daily. Anabel Halon, MD Taking Active Self  ondansetron [facility]) 4 MG tablet [id number] Yes Take 1 tablet (4 mg total) by mouth every 8 (eight) hours as needed for nausea or vomiting. Anabel Halon, MD Taking Active Self  [facility] ULTRA TEST test strip [id number] Yes USE TO TEST BLOOD SUGAR TWICE DAILY. Anabel Halon, MD Taking Active Self  topiramate (TOPAMAX) 50 MG tablet [id number] Yes Take 50 mg by mouth at bedtime. [provider] Taking Active Self           Med Note Jomarie Longs, [facility]   Sat Jan 08, 2023  3:24 PM) Unk last dose            Home Care and Equipment/Supplies: Were Home Health Services Ordered?: Yes Name of Home Health Agency:: [facility] Has Agency set up a time to come to your home?: No EMR reviewed for Home Health Orders: Orders present/patient has not received call (refer to CM for follow-up) Any new equipment or medical supplies ordered?: Yes Name of Medical supply agency?: [facility] Were you able to get the equipment/medical supplies?: Yes Do you have any questions related to the use of the equipment/supplies?: No  Functional Questionnaire: Do you need assistance with bathing/showering or dressing?: No Do you need assistance with meal preparation?: No (eats out alot) Do you need assistance with eating?: No Do you have difficulty maintaining continence: No Do you need assistance with getting out of bed/getting out of a chair/moving?: No Do you have difficulty managing or taking your medications?: Yes  Follow up appointments reviewed: PCP Follow-up appointment confirmed?: Yes Date of PCP follow-up appointment?: 01/20/23 Follow-up Provider: [facility] Follow-up appointment confirmed?: No Do you need transportation to your follow-up appointment?: No Do you understand care options if your condition(s) worsen?: Yes-patient verbalized understanding  SDOH Interventions Today     Flowsheet Row Most Recent Value  SDOH Interventions   Food Insecurity Interventions Intervention Not Indicated  Housing Interventions Intervention Not Indicated  Transportation Interventions Intervention Not Indicated  Utilities Interventions Intervention Not Indicated     Placed call to patient and left a voicemail. Spoke with son , Aggie Hacker who is on the [place].Marland Kitchen  Reviewed all discharge papers and changes to medications. Son has gone the pharmacy and had medications reviewed and sorted out. Son went to patients home today to give patient her medications.  Son has a plan in place for a caregiver to assist for a while at patients home until the family can see how patient does post discharge. Hospital follow up appointment made by the careguide.   Reviewed and offered 30 day TOC program and son declined. Provided my contact information for son to call me if needed in the future.    Reviewed importance of a healthy diet and hydration.   Lonia Chimera, RN, BSN, CEN [employer]- Transition of Care Team.  [facility] [phone]

## 2023-01-16 DIAGNOSIS — I119 Hypertensive heart disease without heart failure: Secondary | ICD-10-CM | POA: Diagnosis not present

## 2023-01-16 DIAGNOSIS — E871 Hypo-osmolality and hyponatremia: Secondary | ICD-10-CM | POA: Diagnosis not present

## 2023-01-16 DIAGNOSIS — K219 Gastro-esophageal reflux disease without esophagitis: Secondary | ICD-10-CM | POA: Diagnosis not present

## 2023-01-16 DIAGNOSIS — G319 Degenerative disease of nervous system, unspecified: Secondary | ICD-10-CM | POA: Diagnosis not present

## 2023-01-16 DIAGNOSIS — Z7983 Long term (current) use of bisphosphonates: Secondary | ICD-10-CM | POA: Diagnosis not present

## 2023-01-16 DIAGNOSIS — K5730 Diverticulosis of large intestine without perforation or abscess without bleeding: Secondary | ICD-10-CM | POA: Diagnosis not present

## 2023-01-16 DIAGNOSIS — Z7901 Long term (current) use of anticoagulants: Secondary | ICD-10-CM | POA: Diagnosis not present

## 2023-01-16 DIAGNOSIS — Z9181 History of falling: Secondary | ICD-10-CM | POA: Diagnosis not present

## 2023-01-16 DIAGNOSIS — I480 Paroxysmal atrial fibrillation: Secondary | ICD-10-CM | POA: Diagnosis not present

## 2023-01-16 DIAGNOSIS — F0283 Dementia in other diseases classified elsewhere, unspecified severity, with mood disturbance: Secondary | ICD-10-CM | POA: Diagnosis not present

## 2023-01-16 DIAGNOSIS — F32A Depression, unspecified: Secondary | ICD-10-CM | POA: Diagnosis not present

## 2023-01-16 DIAGNOSIS — E039 Hypothyroidism, unspecified: Secondary | ICD-10-CM | POA: Diagnosis not present

## 2023-01-16 DIAGNOSIS — I452 Bifascicular block: Secondary | ICD-10-CM | POA: Diagnosis not present

## 2023-01-16 DIAGNOSIS — G4733 Obstructive sleep apnea (adult) (pediatric): Secondary | ICD-10-CM | POA: Diagnosis not present

## 2023-01-16 DIAGNOSIS — Z7984 Long term (current) use of oral hypoglycemic drugs: Secondary | ICD-10-CM | POA: Diagnosis not present

## 2023-01-16 DIAGNOSIS — I319 Disease of pericardium, unspecified: Secondary | ICD-10-CM | POA: Diagnosis not present

## 2023-01-16 DIAGNOSIS — E119 Type 2 diabetes mellitus without complications: Secondary | ICD-10-CM | POA: Diagnosis not present

## 2023-01-16 DIAGNOSIS — E876 Hypokalemia: Secondary | ICD-10-CM | POA: Diagnosis not present

## 2023-01-18 NOTE — Patient Instructions (Signed)

## 2023-01-18 NOTE — Progress Notes (Unsigned)
   Established Patient Office Visit   Subjective  Patient ID: [name], female    DOB: 29-Nov-1945  Age: 77 y.o. MRN: [id number]  No chief complaint on file.   She  has a past medical history of Allergic rhinitis, Arthritis, B12 deficiency, Bipolar disorder (HCC), Coronary artery disease, Depression, Diabetes mellitus (HCC), Diabetes mellitus (HCC) (10/05/2021), Essential hypertension, GERD (gastroesophageal reflux disease), Hashimoto's thyroiditis, History of transient ischemic attack (TIA), Hyperlipidemia, Hypothyroidism, PSVT (paroxysmal supraventricular tachycardia) (HCC), and Right bundle branch block.  HPI Patient presents to the clinic for follow-up after an ED visit. Patient experienced weakness, gastrointestinal symptoms including diarrhea, gas, and dyspepsia, and mild dysuria with frequency. Her family provided Pepto-Bismol and Imodium for the diarrhea, though she took little due to her dementia. She also reported fevers, chills, and a decreased appetite. Her son, who provides additional history, noted occasional confusion consistent with her baseline dementia. She normally lives alone with regular family check-ins. ED Plan included: Hyponatremia likely due to dehydration; IV fluids was given Repeat Sodium level 129 on 01/11/23 Hypokalemia was treated with potassium replacement (p.o. and IV);  Repeat Potassium  level 3.8 on 01/11/23 Encephalopathy likely from dehydration and hyponatremia; monitoring improvement with rehydration. UA normal, urine culture negative Dementia: Continue Aricept. CT results:   ROS    Objective:     There were no vitals taken for this visit. {Vitals History (Optional):23777}  Physical Exam   No results found for any visits on 01/20/23.  The 10-year ASCVD risk score (Arnett DK, et al., 2019) is: 46.5%    Assessment & Plan:  There are no diagnoses linked to this encounter.  No follow-ups on file.   Cruzita Lederer Newman Nip, FNP

## 2023-01-20 ENCOUNTER — Telehealth: Payer: Self-pay

## 2023-01-20 ENCOUNTER — Ambulatory Visit: Payer: PPO | Admitting: Family Medicine

## 2023-01-20 ENCOUNTER — Encounter: Payer: Self-pay | Admitting: Family Medicine

## 2023-01-20 VITALS — BP 128/75 | HR 63 | Ht 62.5 in | Wt 184.1 lb

## 2023-01-20 DIAGNOSIS — E871 Hypo-osmolality and hyponatremia: Secondary | ICD-10-CM

## 2023-01-20 DIAGNOSIS — Z09 Encounter for follow-up examination after completed treatment for conditions other than malignant neoplasm: Secondary | ICD-10-CM

## 2023-01-20 NOTE — Assessment & Plan Note (Signed)
CMP and CBC labs ordered. The hospital chart, including the discharge summary, was thoroughly reviewed Medications were thoroughly reviewed and reconciled with the patient.  Patient reports feeling well and has experienced no acute episodes since discharge

## 2023-01-20 NOTE — Telephone Encounter (Signed)
[name] advised with verbal understanding

## 2023-01-20 NOTE — Telephone Encounter (Signed)
Copied from CRM [id number]. Topic: General - Other >> Jan 20, 2023  8:51 AM Carlatta H wrote: Reason for CRM: Onalee Hua from [place] [facility] [phone] Needs verbal orders//He also has some concerns with severity level 2 medications:  amiodarone (PACERONE) 200 MG,donepezil (ARICEPT) 5 MG tablet,donepezil (ARICEPT) 5 MG tablet//

## 2023-01-21 ENCOUNTER — Encounter: Payer: Self-pay | Admitting: Family Medicine

## 2023-01-21 ENCOUNTER — Other Ambulatory Visit: Payer: Self-pay | Admitting: Family Medicine

## 2023-01-21 LAB — CMP14+EGFR
ALT: 19 [IU]/L (ref 0–32)
AST: 16 [IU]/L (ref 0–40)
Albumin: 4.6 g/dL (ref 3.8–4.8)
Alkaline Phosphatase: 105 [IU]/L (ref 44–121)
BUN/Creatinine Ratio: 11 — ABNORMAL LOW (ref 12–28)
BUN: 7 mg/dL — ABNORMAL LOW (ref 8–27)
Bilirubin Total: 0.5 mg/dL (ref 0.0–1.2)
CO2: 22 mmol/L (ref 20–29)
Calcium: 9.9 mg/dL (ref 8.7–10.3)
Chloride: 91 mmol/L — ABNORMAL LOW (ref 96–106)
Creatinine, Ser: 0.63 mg/dL (ref 0.57–1.00)
Globulin, Total: 2.3 g/dL (ref 1.5–4.5)
Glucose: 83 mg/dL (ref 70–99)
Potassium: 4.6 mmol/L (ref 3.5–5.2)
Sodium: 129 mmol/L — ABNORMAL LOW (ref 134–144)
Total Protein: 6.9 g/dL (ref 6.0–8.5)
eGFR: 92 mL/min/{1.73_m2} (ref >59)

## 2023-01-21 LAB — CBC WITH DIFFERENTIAL/PLATELET
Basophils Absolute: 0 10*3/uL (ref 0.0–0.2)
Basos: 0 %
EOS (ABSOLUTE): 0.1 10*3/uL (ref 0.0–0.4)
Eos: 1 %
Hematocrit: 42.7 % (ref 34.0–46.6)
Hemoglobin: 14.6 g/dL (ref 11.1–15.9)
Immature Grans (Abs): 0.1 10*3/uL (ref 0.0–0.1)
Immature Granulocytes: 1 %
Lymphocytes Absolute: 1.6 10*3/uL (ref 0.7–3.1)
Lymphs: 13 %
MCH: 32.9 pg (ref 26.6–33.0)
MCHC: 34.2 g/dL (ref 31.5–35.7)
MCV: 96 fL (ref 79–97)
Monocytes Absolute: 1.1 10*3/uL — ABNORMAL HIGH (ref 0.1–0.9)
Monocytes: 9 %
Neutrophils Absolute: 9.3 10*3/uL — ABNORMAL HIGH (ref 1.4–7.0)
Neutrophils: 76 %
Platelets: 566 10*3/uL — ABNORMAL HIGH (ref 150–450)
RBC: 4.44 x10E6/uL (ref 3.77–5.28)
RDW: 12.1 % (ref 11.7–15.4)
WBC: 12.2 10*3/uL — ABNORMAL HIGH (ref 3.4–10.8)

## 2023-01-21 MED ORDER — SODIUM CHLORIDE 1 G PO TABS: 1.0000 g | ORAL_TABLET | Freq: Every day | ORAL | 0 refills | Status: AC

## 2023-01-21 NOTE — Telephone Encounter (Signed)
I prescribed Sodium Tablet 1 g to be taken once daily for three days. Follow-up labs will be required during the next visit with Dr. Allena Katz to monitor trends.

## 2023-01-22 ENCOUNTER — Other Ambulatory Visit: Payer: Self-pay | Admitting: Internal Medicine

## 2023-01-28 ENCOUNTER — Encounter: Payer: Self-pay | Admitting: Internal Medicine

## 2023-01-31 ENCOUNTER — Other Ambulatory Visit: Payer: Self-pay | Admitting: Internal Medicine

## 2023-01-31 DIAGNOSIS — I480 Paroxysmal atrial fibrillation: Secondary | ICD-10-CM | POA: Insufficient documentation

## 2023-01-31 MED ORDER — AMIODARONE HCL 200 MG PO TABS: 200.0000 mg | ORAL_TABLET | Freq: Every day | ORAL | 0 refills | Status: DC

## 2023-01-31 MED ORDER — APIXABAN 5 MG PO TABS: 5.0000 mg | ORAL_TABLET | Freq: Two times a day (BID) | ORAL | 0 refills | Status: DC

## 2023-02-07 ENCOUNTER — Encounter: Payer: Self-pay | Admitting: Internal Medicine

## 2023-02-07 ENCOUNTER — Ambulatory Visit (INDEPENDENT_AMBULATORY_CARE_PROVIDER_SITE_OTHER): Payer: PPO | Admitting: Internal Medicine

## 2023-02-07 VITALS — BP 134/77 | HR 64 | Ht 62.0 in | Wt 180.6 lb

## 2023-02-07 DIAGNOSIS — R051 Acute cough: Secondary | ICD-10-CM | POA: Diagnosis not present

## 2023-02-07 DIAGNOSIS — F02B18 Dementia in other diseases classified elsewhere, moderate, with other behavioral disturbance: Secondary | ICD-10-CM

## 2023-02-07 DIAGNOSIS — F3170 Bipolar disorder, currently in remission, most recent episode unspecified: Secondary | ICD-10-CM

## 2023-02-07 DIAGNOSIS — G300 Alzheimer's disease with early onset: Secondary | ICD-10-CM | POA: Diagnosis not present

## 2023-02-07 DIAGNOSIS — E890 Postprocedural hypothyroidism: Secondary | ICD-10-CM

## 2023-02-07 DIAGNOSIS — E1169 Type 2 diabetes mellitus with other specified complication: Secondary | ICD-10-CM

## 2023-02-07 DIAGNOSIS — I10 Essential (primary) hypertension: Secondary | ICD-10-CM | POA: Diagnosis not present

## 2023-02-07 DIAGNOSIS — K219 Gastro-esophageal reflux disease without esophagitis: Secondary | ICD-10-CM

## 2023-02-07 DIAGNOSIS — I480 Paroxysmal atrial fibrillation: Secondary | ICD-10-CM

## 2023-02-07 DIAGNOSIS — E871 Hypo-osmolality and hyponatremia: Secondary | ICD-10-CM | POA: Diagnosis not present

## 2023-02-07 MED ORDER — DONEPEZIL HCL 5 MG PO TABS: 5.0000 mg | ORAL_TABLET | Freq: Every day | ORAL | 1 refills | Status: DC

## 2023-02-07 MED ORDER — BENZONATATE 100 MG PO CAPS: 100.0000 mg | ORAL_CAPSULE | Freq: Two times a day (BID) | ORAL | 0 refills | Status: AC | PRN

## 2023-02-07 NOTE — Progress Notes (Signed)
 Established Patient Office Visit  Subjective:  Patient ID: [name], female    DOB: 1945-10-30  Age: 78 y.o. MRN: [id number]  CC:  Chief Complaint  Patient presents with   Hypothyroidism    Four month follow up    Hyperlipidemia    Four month follow up    Cough    Dry cough for two weeks    HPI [name] is a 78 y.o. female with past medical history of HTN, PSVT, GERD, hypothyroidism, HLD, mood disorder, OA and obesity who presents for f/u of her chronic medical conditions.  Hypothyroidism: She has been taking levothyroxine  125 mcg QD regularly. She had been having excessive sweating for the last few months, which has improved since decreasing doses of levothyroxine  and Cymbalta .  She reports chronic fatigue as well. Denies any recent change in appetite or weight.  Denies any tremors or palpitations currently.  Denies any fever, chills or LAD.   HTN: BP is well-controlled.  Patient denies headache, dizziness, chest pain, dyspnea or palpitations.  Atrial fibrillation: She was recently admitted for hyponatremia, likely from dehydration.  She developed atrial fibrillation during hospitalization, was placed on amiodarone  and Eliquis .  She also takes diltiazem  240 mg QD.   Type II DM: Her last HbA1c was 6.0 in 01/25.  She is on metformin  and has been tolerating it well.  Denies any polyuria or polyphagia currently.  Bipolar disorder: She is currently on Cymbalta  and Wellbutrin .  She also takes Klonopin  for anxiety.  Followed by psychiatry.  She has chronic fatigue, but denies insomnia, SI or HI currently.  Denies any recent manic episodes.   She reports memory concerns.  Has had episodes of confusion while driving in her neighborhood, and has almost got lost in her neighborhood.  She has had instances when she got ready for her doctors appointment on wrong days.  She has been taking donepezil  now.  She has been evaluated by neurology, but could not pursue further workup due  to transportation issue to [place]. She is currently independent for ADLs.  She lives by herself.  She relies on her brother for transportation.  She also reports dry cough, especially at nighttime for the last 2 weeks.  Denies nasal congestion, dyspnea or wheezing.  Denies fever or chills.  Past Medical History:  Diagnosis Date   Allergic rhinitis    Arthritis    B12 deficiency    Bipolar disorder (HCC)    Coronary artery disease    Depression    Diabetes mellitus (HCC)    Diabetes mellitus (HCC) 10/05/2021   Essential hypertension    GERD (gastroesophageal reflux disease)    Hashimoto's thyroiditis    History of transient ischemic attack (TIA)    Or possibly migraine as well as right amaurosis fugax and ataxia - Dr. Maurice   Hyperlipidemia    Hypothyroidism    PSVT (paroxysmal supraventricular tachycardia) (HCC)    Right bundle branch block     Past Surgical History:  Procedure Laterality Date   CATARACT EXTRACTION W/PHACO Left 05/23/2020   Procedure: CATARACT EXTRACTION PHACO AND INTRAOCULAR LENS PLACEMENT LEFT EYE;  Surgeon: Harrie Agent, MD;  Location: [facility];  Service: Ophthalmology;  Laterality: Left;  left CDE=15.69   CATARACT EXTRACTION W/PHACO Right 06/13/2020   Procedure: CATARACT EXTRACTION PHACO AND INTRAOCULAR LENS PLACEMENT RIGHT EYE;  Surgeon: Harrie Agent, MD;  Location: [facility];  Service: Ophthalmology;  Laterality: Right;  right CDE=11.33   COLONOSCOPY WITH PROPOFOL  N/A 04/22/2022  Procedure: COLONOSCOPY WITH PROPOFOL ;  Surgeon: Shaaron Lamar HERO, MD;  Location: [facility];  Service: Endoscopy;  Laterality: N/A;  8:30 am, asa 3   LAPAROSCOPIC CHOLECYSTECTOMY  2010   POLYPECTOMY  04/22/2022   Procedure: POLYPECTOMY INTESTINAL;  Surgeon: Shaaron Lamar HERO, MD;  Location: [facility];  Service: Endoscopy;;   THYROIDECTOMY  2009   TOTAL KNEE ARTHROPLASTY Left 02/16/2021   Procedure: TOTAL KNEE ARTHROPLASTY;  Surgeon: Melodi Lerner, MD;  Location: [facility];   Service: Orthopedics;  Laterality: Left;    Family History  Problem Relation Age of Onset   Stroke Mother    Thyroid  disease Mother    Depression Father    Hypertension Sister    Aneurysm Sister    Diabetes Brother    Heart attack Brother    Hypertension Brother    Heart failure Brother    Thyroid  disease Brother    Stroke Maternal Grandmother    Heart failure Maternal Grandmother    Heart failure Maternal Grandfather    Stroke Maternal Grandfather    Heart failure Paternal Grandmother    Cancer - Colon Neg Hx     Social History   Socioeconomic History   Marital status: Widowed    Spouse name: Not on file   Number of children: 2   Years of education: 81   Highest education level: Not on file  Occupational History   Occupation: homemaker    Employer: RETIRED  Tobacco Use   Smoking status: Former    Current packs/day: 0.00    Average packs/day: 0.5 packs/day for 10.0 years (5.0 ttl pk-yrs)    Types: Cigarettes    Start date: 01/25/1973    Quit date: 01/26/1983    Years since quitting: 40.0   Smokeless tobacco: Never   Tobacco comments:    quit 30 years ago  Vaping Use   Vaping status: Never Used  Substance and Sexual Activity   Alcohol use: No    Alcohol/week: 0.0 standard drinks of alcohol   Drug use: No   Sexual activity: Not Currently    Birth control/protection: Post-menopausal  Other Topics Concern   Not on file  Social History Narrative   Regular exercise: walk when ableCaffeine use: occasionally   Right handed   Drinks caffeine prn-coffee daily   One floor home   Lives alone with two cats   retired   Chief Executive Officer Drivers of Corporate Investment Banker Strain: Low Risk  (02/13/2021)   Overall Financial Resource Strain (CARDIA)    Difficulty of Paying Living Expenses: Not hard at all  Food Insecurity: No Food Insecurity (01/12/2023)   Hunger Vital Sign    Worried About Running Out of Food in the Last Year: Never true    Ran Out of Food in the Last Year:  Never true  Transportation Needs: No Transportation Needs (01/12/2023)   PRAPARE - Administrator, Civil Service (Medical): No    Lack of Transportation (Non-Medical): No  Physical Activity: Inactive (02/13/2021)   Exercise Vital Sign    Days of Exercise per Week: 0 days    Minutes of Exercise per Session: 0 min  Stress: No Stress Concern Present (02/13/2021)   [employer] of Occupational Health - Occupational Stress Questionnaire    Feeling of Stress : Not at all  Social Connections: Unknown (06/01/2021)   [facility]   Social Network    Social Network: Not on file  Intimate Partner Violence: Patient  Unable To Answer (01/12/2023)   Humiliation, Afraid, Rape, and Kick questionnaire    Fear of Current or Ex-Partner: Patient unable to answer    Emotionally Abused: Patient unable to answer    Physically Abused: Patient unable to answer    Sexually Abused: Patient unable to answer    Outpatient Medications Prior to Visit  Medication Sig Dispense Refill   acetaminophen  (TYLENOL ) 500 MG tablet Take 1,000 mg by mouth every 6 (six) hours as needed for moderate pain or headache.     albuterol  (VENTOLIN  HFA) 108 (90 Base) MCG/ACT inhaler Inhale 2 puffs into the lungs every 6 (six) hours as needed for wheezing or shortness of breath. 18 g 0   alendronate  (FOSAMAX ) 70 MG tablet TAKE 1 TABLET EVERY WEEK IN THE MORNING 30 MIN BEFORE EATING WITH AN 8OZ GLASS OF WATER  (SIT UP 30 MIN) 4 tablet 11   amiodarone  (PACERONE ) 200 MG tablet Take 1 tablet (200 mg total) by mouth daily. 30 tablet 0   apixaban  (ELIQUIS ) 5 MG TABS tablet Take 1 tablet (5 mg total) by mouth 2 (two) times daily. 60 tablet 0   atorvastatin  (LIPITOR ) 80 MG tablet Take 1 tablet (80 mg total) by mouth daily. 90 tablet 3   blood glucose meter kit and supplies KIT Dispense based on patient and insurance preference. Use up to four times daily as directed. 1 each 0   buPROPion  (WELLBUTRIN  XL)  300 MG 24 hr tablet Take 300 mg by mouth daily.      clonazePAM  (KLONOPIN ) 0.5 MG tablet Take 0.25-0.5 mg by mouth in the morning, at noon, and at bedtime.     diltiazem  (CARDIZEM  CD) 240 MG 24 hr capsule TAKE (1) CAPSULE BY MOUTH ONCE DAILY. 90 capsule 3   DULoxetine  (CYMBALTA ) 20 MG capsule Take 20 mg by mouth 2 (two) times daily.     Lancets (ONETOUCH DELICA PLUS LANCET33G) MISC USE TO TEST BLOOD SUGAR TWICE DAILY. 100 each 0   levothyroxine  (SYNTHROID ) 125 MCG tablet Take 1 tablet (125 mcg total) by mouth daily before breakfast. 90 tablet 1   metFORMIN  (GLUCOPHAGE ) 500 MG tablet Take 1 tablet (500 mg total) by mouth 2 (two) times daily with a meal. 60 tablet 5   mirabegron  ER (MYRBETRIQ ) 25 MG TB24 tablet Take 1 tablet (25 mg total) by mouth daily. 30 tablet 5   ondansetron  (ZOFRAN ) 4 MG tablet Take 1 tablet (4 mg total) by mouth every 8 (eight) hours as needed for nausea or vomiting. 20 tablet 0   ONETOUCH ULTRA TEST test strip USE TO TEST BLOOD SUGAR TWICE DAILY. 50 strip 0   topiramate  (TOPAMAX ) 50 MG tablet Take 50 mg by mouth at bedtime.     DEXILANT  60 MG capsule Take 1 capsule by mouth daily.     donepezil  (ARICEPT ) 5 MG tablet TAKE (1) TABLET BY MOUTH AT BEDTIME. 90 tablet 0   pantoprazole  (PROTONIX ) 40 MG tablet TAKE 1 TABLET PO DAILY AT LUNCH. 30 tablet 0   No facility-administered medications prior to visit.    Allergies  Allergen Reactions   Amoxicillin-Pot Clavulanate Rash   Atenolol  Rash   Penicillins Rash    Tolerated Cephalosporin Date: 02/17/21.     Sulfonamide Derivatives Rash    ROS Review of Systems  Constitutional:  Positive for fatigue. Negative for chills and fever.  HENT:  Negative for congestion, sinus pressure and sinus pain.   Eyes:  Negative for pain and discharge.  Respiratory:  Positive for cough.  Negative for shortness of breath.   Cardiovascular:  Negative for chest pain and palpitations.  Gastrointestinal:  Negative for diarrhea, nausea and  vomiting.  Genitourinary:  Negative for dysuria and hematuria.  Musculoskeletal:  Negative for neck pain and neck stiffness.  Skin:  Negative for rash.  Neurological:  Negative for dizziness and syncope.  Psychiatric/Behavioral:  Positive for confusion. Negative for agitation and behavioral problems. The patient is nervous/anxious.       Objective:    Physical Exam Vitals reviewed.  Constitutional:      General: She is not in acute distress.    Appearance: She is obese. She is not diaphoretic.  HENT:     Head: Normocephalic and atraumatic.     Nose: No congestion.     Mouth/Throat:     Mouth: Mucous membranes are moist.     Pharynx: No posterior oropharyngeal erythema.  Eyes:     General: No scleral icterus.    Extraocular Movements: Extraocular movements intact.  Cardiovascular:     Rate and Rhythm: Normal rate and regular rhythm.     Pulses: Normal pulses.     Heart sounds: Normal heart sounds. No murmur heard. Pulmonary:     Breath sounds: Normal breath sounds. No wheezing or rales.  Musculoskeletal:     Cervical back: Neck supple. No tenderness.     Right lower leg: No edema.     Left lower leg: No edema.  Skin:    General: Skin is warm.     Findings: No rash.  Neurological:     General: No focal deficit present.     Mental Status: She is alert and oriented to person, place, and time.     Sensory: No sensory deficit.     Motor: No weakness.  Psychiatric:        Mood and Affect: Mood normal.        Behavior: Behavior normal.     BP 134/77 (BP Location: Left Arm, Patient Position: Sitting, Cuff Size: Normal)   Pulse 64   Ht 5' 2 (1.575 m)   Wt 180 lb 9.6 oz (81.9 kg)   SpO2 96%   BMI 33.03 kg/m  Wt Readings from Last 3 Encounters:  02/07/23 180 lb 9.6 oz (81.9 kg)  01/20/23 184 lb 1.3 oz (83.5 kg)  01/07/23 190 lb (86.2 kg)    Lab Results  Component Value Date   TSH 3.674 01/07/2023   Lab Results  Component Value Date   WBC 12.2 (H) 01/20/2023    HGB 14.6 01/20/2023   HCT 42.7 01/20/2023   MCV 96 01/20/2023   PLT 566 (H) 01/20/2023   Lab Results  Component Value Date   NA 130 (L) 02/07/2023   K 4.1 02/07/2023   CO2 21 02/07/2023   GLUCOSE 95 02/07/2023   BUN 5 (L) 02/07/2023   CREATININE 0.62 02/07/2023   BILITOT 0.5 01/20/2023   ALKPHOS 105 01/20/2023   AST 16 01/20/2023   ALT 19 01/20/2023   PROT 6.9 01/20/2023   ALBUMIN 4.6 01/20/2023   CALCIUM  9.7 02/07/2023   ANIONGAP 11 01/11/2023   EGFR 92 02/07/2023   Lab Results  Component Value Date   CHOL 149 05/26/2021   Lab Results  Component Value Date   HDL 59 05/26/2021   Lab Results  Component Value Date   LDLCALC 73 05/26/2021   Lab Results  Component Value Date   TRIG 94 05/26/2021   Lab Results  Component Value Date  CHOLHDL 2.5 05/26/2021   Lab Results  Component Value Date   HGBA1C 6.0 (H) 02/07/2023      Assessment & Plan:   Problem List Items Addressed This Visit       Cardiovascular and Mediastinum   Essential hypertension   BP Readings from Last 1 Encounters:  02/07/23 134/77   Well-controlled with Diltiazem  240 mg once daily Losartan  was discontinued in the recent hospitalization due to hyponatremia Counseled for compliance with the medications Advised DASH diet and moderate exercise/walking as tolerated      Paroxysmal atrial fibrillation (HCC) - Primary   Currently in sinus rhythm On amiodarone  and diltiazem  On Eliquis  Followed by cardiology        Digestive   Gastroesophageal reflux disease   Takes Pantoprazole  40 mg once daily, but had better response with Dexilant  - started Dexilant  again        Endocrine   Postoperative hypothyroidism   Lab Results  Component Value Date   TSH 3.674 01/07/2023   On Levothyroxine  125 mcg QD recently Followed by endocrinology      Type 2 diabetes mellitus with other specified complication (HCC)   Lab Results  Component Value Date   HGBA1C 6.0 (H) 02/07/2023    Associated with HTN and HLD Well-controlled On Metformin  500 mh BID Advised to check her blood glucose when she has episodes of sweating and facial flushing-could be hypoglycemia Advised to follow diabetic diet On ARB and statin F/u CMP and lipid panel Diabetic eye exam: Advised to follow up with Ophthalmology for diabetic eye exam      Relevant Orders   Basic Metabolic Panel (BMET) (Completed)   Hemoglobin A1c (Completed)     Nervous and Auditory   Dementia   MMSE: 24/30 Has had episodes of confusion, could be from multiple psychiatric medicines On donepezil  5 mg daily Referred to neurology, but could not pursue further workup due to transportation issue to [place].       Relevant Medications   donepezil  (ARICEPT ) 5 MG tablet     Other   Bipolar disorder (HCC) (Chronic)   Followed by Dr. Jess On Wellbutrin  and Cymbalta  On clonazepam  as needed      Hyponatremia   Due to dehydration and/or SIADH Recently discontinued losartan  Advised to maintain adequate hydration Recheck BMP      Acute cough   Could be due to postnasal drip from allergies and/or dry air exposure at nighttime Advised to use Flonase Tessalon  PRN for cough      Relevant Medications   benzonatate  (TESSALON ) 100 MG capsule     Meds ordered this encounter  Medications   benzonatate  (TESSALON ) 100 MG capsule    Sig: Take 1 capsule (100 mg total) by mouth 2 (two) times daily as needed for cough.    Dispense:  20 capsule    Refill:  0   donepezil  (ARICEPT ) 5 MG tablet    Sig: Take 1 tablet (5 mg total) by mouth at bedtime.    Dispense:  90 tablet    Refill:  1    Follow-up: Return in about 4 months (around 06/07/2023) for DM and HTN.    Suzzane MARLA Blanch, MD

## 2023-02-07 NOTE — Patient Instructions (Signed)
 Please start taking Donepezil as prescribed.  Please take Tessalon as needed for cough.  Please continue to take medications as prescribed.  Please continue to follow low carb diet and ambulate as tolerated.

## 2023-02-08 LAB — BASIC METABOLIC PANEL
BUN/Creatinine Ratio: 8 — ABNORMAL LOW (ref 12–28)
BUN: 5 mg/dL — ABNORMAL LOW (ref 8–27)
CO2: 21 mmol/L (ref 20–29)
Calcium: 9.7 mg/dL (ref 8.7–10.3)
Chloride: 93 mmol/L — ABNORMAL LOW (ref 96–106)
Creatinine, Ser: 0.62 mg/dL (ref 0.57–1.00)
Glucose: 95 mg/dL (ref 70–99)
Potassium: 4.1 mmol/L (ref 3.5–5.2)
Sodium: 130 mmol/L — ABNORMAL LOW (ref 134–144)
eGFR: 92 mL/min/{1.73_m2} (ref >59)

## 2023-02-08 LAB — HEMOGLOBIN A1C
Est. average glucose Bld gHb Est-mCnc: 126 mg/dL
Hgb A1c MFr Bld: 6 % — ABNORMAL HIGH (ref 4.8–5.6)

## 2023-02-10 ENCOUNTER — Other Ambulatory Visit: Payer: Self-pay | Admitting: Internal Medicine

## 2023-02-10 DIAGNOSIS — R051 Acute cough: Secondary | ICD-10-CM | POA: Insufficient documentation

## 2023-02-10 NOTE — Assessment & Plan Note (Addendum)
Currently in sinus rhythm On amiodarone and diltiazem On Eliquis Followed by cardiology

## 2023-02-10 NOTE — Assessment & Plan Note (Signed)
Could be due to postnasal drip from allergies and/or dry air exposure at nighttime Advised to use Flonase Tessalon PRN for cough

## 2023-02-10 NOTE — Assessment & Plan Note (Signed)
Followed by Dr. Betti Cruz On Wellbutrin and Cymbalta On clonazepam as needed

## 2023-02-10 NOTE — Assessment & Plan Note (Addendum)
Due to dehydration and/or SIADH Recently discontinued losartan Advised to maintain adequate hydration Recheck BMP

## 2023-02-10 NOTE — Assessment & Plan Note (Signed)
Takes Pantoprazole 40 mg once daily, but had better response with Dexilant - started Dexilant again

## 2023-02-10 NOTE — Assessment & Plan Note (Addendum)
BP Readings from Last 1 Encounters:  02/07/23 134/77   Well-controlled with Diltiazem 240 mg once daily Losartan was discontinued in the recent hospitalization due to hyponatremia Counseled for compliance with the medications Advised DASH diet and moderate exercise/walking as tolerated

## 2023-02-10 NOTE — Assessment & Plan Note (Signed)
Lab Results  Component Value Date   HGBA1C 6.0 (H) 02/07/2023   Associated with HTN and HLD Well-controlled On Metformin 500 mh BID Advised to check her blood glucose when she has episodes of sweating and facial flushing-could be hypoglycemia Advised to follow diabetic diet On ARB and statin F/u CMP and lipid panel Diabetic eye exam: Advised to follow up with Ophthalmology for diabetic eye exam

## 2023-02-10 NOTE — Assessment & Plan Note (Addendum)
Lab Results  Component Value Date   TSH 3.674 01/07/2023   On Levothyroxine 125 mcg QD recently Followed by endocrinology

## 2023-02-10 NOTE — Assessment & Plan Note (Signed)
MMSE: 24/30 Has had episodes of confusion, could be from multiple psychiatric medicines On donepezil 5 mg daily Referred to neurology, but could not pursue further workup due to transportation issue to [place].

## 2023-02-14 ENCOUNTER — Other Ambulatory Visit: Payer: Self-pay | Admitting: Internal Medicine

## 2023-02-14 DIAGNOSIS — K219 Gastro-esophageal reflux disease without esophagitis: Secondary | ICD-10-CM

## 2023-02-14 MED ORDER — PANTOPRAZOLE SODIUM 40 MG PO TBEC: 40.0000 mg | DELAYED_RELEASE_TABLET | Freq: Every day | ORAL | 3 refills | Status: AC

## 2023-02-17 ENCOUNTER — Other Ambulatory Visit: Payer: Self-pay | Admitting: Internal Medicine

## 2023-02-17 MED ORDER — ONETOUCH ULTRA TEST VI STRP: 1.0000 | ORAL_STRIP | Freq: Three times a day (TID) | 11 refills | Status: AC

## 2023-02-26 ENCOUNTER — Other Ambulatory Visit: Payer: Self-pay | Admitting: Nurse Practitioner

## 2023-02-26 DIAGNOSIS — E038 Other specified hypothyroidism: Secondary | ICD-10-CM

## 2023-02-28 ENCOUNTER — Ambulatory Visit: Payer: PPO | Attending: Physician Assistant | Admitting: Physician Assistant

## 2023-02-28 ENCOUNTER — Encounter: Payer: Self-pay | Admitting: Physician Assistant

## 2023-02-28 VITALS — BP 118/62 | HR 67 | Ht 61.0 in | Wt 184.0 lb

## 2023-02-28 DIAGNOSIS — I471 Supraventricular tachycardia, unspecified: Secondary | ICD-10-CM

## 2023-02-28 DIAGNOSIS — E871 Hypo-osmolality and hyponatremia: Secondary | ICD-10-CM

## 2023-02-28 DIAGNOSIS — I2510 Atherosclerotic heart disease of native coronary artery without angina pectoris: Secondary | ICD-10-CM

## 2023-02-28 DIAGNOSIS — I452 Bifascicular block: Secondary | ICD-10-CM | POA: Diagnosis not present

## 2023-02-28 DIAGNOSIS — I480 Paroxysmal atrial fibrillation: Secondary | ICD-10-CM | POA: Diagnosis not present

## 2023-02-28 NOTE — Progress Notes (Addendum)
Cardiology Office Note    Date:  02/28/2023  ID:  [name], [name] Mar 30, 1945, MRN [id number] PCP:  Anabel Halon, MD  Cardiologist:  Nona Dell, MD  Electrophysiologist:  None   Chief Complaint: f/u afib  History of Present Illness: .    [name] is a 78 y.o. female with visit-pertinent history of PAF, PSVT, PACs, PVCs seen on prior event monitor, DM, B12 deficiency, HTN, bipolar disorder, GERD, Hashimoto's thyroiditis, TIA vs migraine (previously seen by neuro), HLD, RBBB +LAFB, minimal nonobstructive CAD by coronary CTA 03/2020, dementia who presented for follow-up. Event monitor 05/2019 showed PSVT and occasional PACs/PVCs.. Stress test 10/2019 was intermediate risk due to LVEF 43%, no ischemia or infarct, though EF felt falsely low due to LVEF having been normal by recent echo otherwise. Coronary CTA 03/2020 showed minimal nonobstructive disease. She was recently in the hospital with hypovolemia, diarrheal illness, metabolic encephalopathy, hyponatremia, requiring IVF. She was seen by EP for new onset atrial fibrillation . She was started on Eliquis and amiodarone. She was also continued on diltiazem. AAD options otherwise felt limited by conduction disease with RBBB/LAFB. Echo showed EF 55-60%, G1DD, mild RVE.  She is seen for follow-up today with her son. She lives alone but her family has all stepped in and now helps administer her medications and also has someone come in in the evenings to help. She has been doing well since DC without any interim cardiac complaints. No CP, SOB, palpitations.   Labwork independently reviewed: 01/2022 K 4.1, Cr 0.62, Na 130 c/w prior 12/2022 LFTS ok, CBC OK, TSH OK 2023 LDL 73, trig 94 followed by primary care  ROS: .    Please see the history of present illness.  All other systems are reviewed and otherwise negative.  Studies Reviewed: Marland Kitchen    EKG:  EKG is ordered today, personally reviewed, demonstrating NSR 61bpm, borderline RBBB,  LAFB, nonspecific STTW changes  CV Studies: Cardiac studies reviewed are outlined and summarized above. Otherwise please see EMR for full report.   Current Reported Medications:.    Current Meds  Medication Sig   acetaminophen (TYLENOL) 500 MG tablet Take 1,000 mg by mouth every 6 (six) hours as needed for moderate pain or headache.   albuterol (VENTOLIN HFA) 108 (90 Base) MCG/ACT inhaler Inhale 2 puffs into the lungs every 6 (six) hours as needed for wheezing or shortness of breath.   alendronate (FOSAMAX) 70 MG tablet TAKE 1 TABLET EVERY WEEK IN THE MORNING 30 MIN BEFORE EATING WITH AN 8OZ GLASS OF WATER (SIT UP 30 MIN)   amiodarone (PACERONE) 200 MG tablet Take 1 tablet (200 mg total) by mouth daily.   apixaban (ELIQUIS) 5 MG TABS tablet Take 1 tablet (5 mg total) by mouth 2 (two) times daily.   atorvastatin (LIPITOR) 80 MG tablet Take 1 tablet (80 mg total) by mouth daily.   benzonatate (TESSALON) 100 MG capsule Take 1 capsule (100 mg total) by mouth 2 (two) times daily as needed for cough.   blood glucose meter kit and supplies KIT Dispense based on patient and insurance preference. Use up to four times daily as directed.   buPROPion (WELLBUTRIN XL) 300 MG 24 hr tablet Take 300 mg by mouth daily.    clonazePAM (KLONOPIN) 0.5 MG tablet Take 0.25-0.5 mg by mouth in the morning, at noon, and at bedtime.   diltiazem (CARDIZEM CD) 240 MG 24 hr capsule TAKE (1) CAPSULE BY MOUTH ONCE DAILY.   donepezil (  ARICEPT) 5 MG tablet Take 1 tablet (5 mg total) by mouth at bedtime.   DULoxetine (CYMBALTA) 20 MG capsule Take 20 mg by mouth 2 (two) times daily.   glucose blood (ONETOUCH ULTRA TEST) test strip 1 each by Other route in the morning, at noon, and at bedtime. Use as instructed   Lancets (ONETOUCH DELICA PLUS LANCET33G) MISC USE TO TEST BLOOD SUGAR TWICE DAILY.   levothyroxine (SYNTHROID) 125 MCG tablet Take 1 tablet (125 mcg total) by mouth daily before breakfast.   metFORMIN (GLUCOPHAGE) 500  MG tablet Take 1 tablet (500 mg total) by mouth 2 (two) times daily with a meal.   mirabegron ER (MYRBETRIQ) 25 MG TB24 tablet Take 1 tablet (25 mg total) by mouth daily.   ondansetron (ZOFRAN) 4 MG tablet Take 1 tablet (4 mg total) by mouth every 8 (eight) hours as needed for nausea or vomiting.   pantoprazole (PROTONIX) 40 MG tablet Take 1 tablet (40 mg total) by mouth daily.    Physical Exam:    VS:  BP 118/62   Pulse 67   Ht 5\' 1"  (1.549 m)   Wt 184 lb (83.5 kg)   SpO2 95%   BMI 34.77 kg/m    Wt Readings from Last 3 Encounters:  02/28/23 184 lb (83.5 kg)  02/07/23 180 lb 9.6 oz (81.9 kg)  01/20/23 184 lb 1.3 oz (83.5 kg)    GEN: Well nourished, well developed in no acute distress NECK: No JVD; No carotid bruits CARDIAC: RRR, no murmurs, rubs, gallops RESPIRATORY:  Clear to auscultation without rales, wheezing or rhonchi  ABDOMEN: Soft, non-tender, non-distended EXTREMITIES:  No edema; No acute deformity   Asessement and Plan:.    1. Paroxysmal atrial fibrillation, also h/o PSVT, PACs, PVCs with underlying chronic RBBB+LAFB - she is maintaining NSR on amiodarone and diltiazem. She is now appropriately anticoagulated with Eliquis and doing well, dose appropriate. I will reach out to Dr. Lalla Brothers to inquire whether amiodarone was intended to be a long term medicine or short term given the acute dehydration she had presented with. If we continue this long term, would need to ensure routine monitoring of LFTs, thyroid, eye exams, and lungs. Defer PFTs at this time given dementia; last CXR no acute pulm abnormality. Pt's son would like to be point of contact. His number is [phone]. Asked nurse to update computer. He filled out DPR. We also discussed notifying for any fast or slow HR at home when family checks in on her.  2. Nonobstructive CAD - no recent anginal symptoms. No longer on ASA given concomitant Eliquis.  3. Hyponatremia - this appears to be an intermittent finding, more  persistent since December 2024. Not on any specific cardiac meds that would cause this. Further management per PCP. Appears euvolemic.    Disposition: F/u with Dr. Diona Browner or me in 3 months.  Signed, Laurann Montana, PA-C

## 2023-02-28 NOTE — Patient Instructions (Signed)
Medication Instructions:  Your physician recommends that you continue on your current medications as directed. Please refer to the Current Medication list given to you today.  *If you need a refill on your cardiac medications before your next appointment, please call your pharmacy*   Lab Work: NONE   If you have labs (blood work) drawn today and your tests are completely normal, you will receive your results only by: [facility] (if you have MyChart) OR A paper copy in the mail If you have any lab test that is abnormal or we need to change your treatment, we will call you to review the results.   Testing/Procedures: NONE    Follow-Up: At [facility], you and your health needs are our priority.  As part of our continuing mission to provide you with exceptional heart care, we have created designated Provider Care Teams.  These Care Teams include your primary Cardiologist (physician) and Advanced Practice Providers (APPs -  Physician Assistants and Nurse Practitioners) who all work together to provide you with the care you need, when you need it.  We recommend signing up for the patient portal called "MyChart".  Sign up information is provided on this After Visit Summary.  MyChart is used to connect with patients for Virtual Visits (Telemedicine).  Patients are able to view lab/test results, encounter notes, upcoming appointments, etc.  Non-urgent messages can be sent to your provider as well.   To learn more about what you can do with MyChart, go to [email or web address].    Your next appointment:   3 month(s)  Provider:   You may see Rozann Lesches, MD or one of the following Advanced Practice Providers on your designated Care Team:   Bernerd Pho, PA-C  Ermalinda Barrios, PA-C     Other Instructions Thank you for choosing [facility]!

## 2023-03-01 ENCOUNTER — Telehealth: Payer: Self-pay | Admitting: Physician Assistant

## 2023-03-01 NOTE — Telephone Encounter (Signed)
Patients son notified and verbalized understanding. Pt's son had no questions or concerns at this time.

## 2023-03-01 NOTE — Telephone Encounter (Signed)
 Hi triage - FYI patient has dementia and son [name] requested to be the primary point of contact for this call (patient agreeable).  I told [name] and her son yesterday that I would reach out to Dr. [name] to inquire about the duration of her amiodarone  therapy since she is back doing well and in normal rhythm. He recommends we go ahead and stop amiodarone  and see how she does. Please also remove from med list. Otherwise would continue present regimen. He suggests using some sort of home monitoring device to watch for breakthrough afib like Apple Watch or Kardia mobile. Apple Watch is probably the best option for her with her dementia.  Keep f/u as planned. Thanks!

## 2023-03-07 ENCOUNTER — Other Ambulatory Visit: Payer: Self-pay | Admitting: Internal Medicine

## 2023-03-07 DIAGNOSIS — I480 Paroxysmal atrial fibrillation: Secondary | ICD-10-CM

## 2023-03-17 ENCOUNTER — Other Ambulatory Visit: Payer: Self-pay | Admitting: Internal Medicine

## 2023-03-17 DIAGNOSIS — I10 Essential (primary) hypertension: Secondary | ICD-10-CM

## 2023-03-18 DIAGNOSIS — E890 Postprocedural hypothyroidism: Secondary | ICD-10-CM | POA: Diagnosis not present

## 2023-03-18 DIAGNOSIS — E1169 Type 2 diabetes mellitus with other specified complication: Secondary | ICD-10-CM | POA: Diagnosis not present

## 2023-03-19 LAB — TSH+T4F+T3FREE
Free T4: 1.75 ng/dL (ref 0.82–1.77)
T3, Free: 2.1 pg/mL (ref 2.0–4.4)
TSH: 6.09 u[IU]/mL — ABNORMAL HIGH (ref 0.450–4.500)

## 2023-03-19 LAB — HEMOGLOBIN A1C
Est. average glucose Bld gHb Est-mCnc: 126 mg/dL
Hgb A1c MFr Bld: 6 % — ABNORMAL HIGH (ref 4.8–5.6)

## 2023-03-31 ENCOUNTER — Encounter: Payer: Self-pay | Admitting: Nurse Practitioner

## 2023-03-31 ENCOUNTER — Ambulatory Visit: Payer: PPO | Admitting: Nurse Practitioner

## 2023-03-31 VITALS — BP 124/72 | HR 65 | Ht 61.0 in | Wt 185.8 lb

## 2023-03-31 DIAGNOSIS — E890 Postprocedural hypothyroidism: Secondary | ICD-10-CM

## 2023-03-31 DIAGNOSIS — E038 Other specified hypothyroidism: Secondary | ICD-10-CM

## 2023-03-31 MED ORDER — LEVOTHYROXINE SODIUM 125 MCG PO TABS: 125.0000 ug | ORAL_TABLET | Freq: Every day | ORAL | 1 refills | Status: DC

## 2023-03-31 NOTE — Progress Notes (Signed)
 Endocrinology Follow Up Note                                         03/31/2023, 3:18 PM  Subjective:   Subjective    [name] is a 78 y.o.-year-old female patient being seen in follow up after being seen in consultation for hypothyroidism referred by Anabel Halon, MD.     Past Medical History:  Diagnosis Date   Allergic rhinitis    Arthritis    B12 deficiency    Bipolar disorder (HCC)    Coronary artery disease    Depression    Diabetes mellitus (HCC)    Diabetes mellitus (HCC) 10/05/2021   Essential hypertension    GERD (gastroesophageal reflux disease)    Hashimoto's thyroiditis    History of transient ischemic attack (TIA)    Or possibly migraine as well as right amaurosis fugax and ataxia - Dr. Sandria Manly   Hyperlipidemia    Hypothyroidism    PSVT (paroxysmal supraventricular tachycardia) (HCC)    Right bundle branch block     Past Surgical History:  Procedure Laterality Date   CATARACT EXTRACTION W/PHACO Left 05/23/2020   Procedure: CATARACT EXTRACTION PHACO AND INTRAOCULAR LENS PLACEMENT LEFT EYE;  Surgeon: Fabio Pierce, MD;  Location: [facility];  Service: Ophthalmology;  Laterality: Left;  left CDE=15.69   CATARACT EXTRACTION W/PHACO Right 06/13/2020   Procedure: CATARACT EXTRACTION PHACO AND INTRAOCULAR LENS PLACEMENT RIGHT EYE;  Surgeon: Fabio Pierce, MD;  Location: [facility];  Service: Ophthalmology;  Laterality: Right;  right CDE=11.33   COLONOSCOPY WITH PROPOFOL N/A 04/22/2022   Procedure: COLONOSCOPY WITH PROPOFOL;  Surgeon: Corbin Ade, MD;  Location: [facility];  Service: Endoscopy;  Laterality: N/A;  8:30 am, asa 3   LAPAROSCOPIC CHOLECYSTECTOMY  2010   POLYPECTOMY  04/22/2022   Procedure: POLYPECTOMY INTESTINAL;  Surgeon: Corbin Ade, MD;  Location: [facility];  Service: Endoscopy;;   THYROIDECTOMY  2009   TOTAL KNEE ARTHROPLASTY Left 02/16/2021   Procedure: TOTAL KNEE  ARTHROPLASTY;  Surgeon: Ollen Gross, MD;  Location: [facility];  Service: Orthopedics;  Laterality: Left;    Social History   Socioeconomic History   Marital status: Widowed    Spouse name: Not on file   Number of children: 2   Years of education: 25   Highest education level: Not on file  Occupational History   Occupation: homemaker    Employer: RETIRED  Tobacco Use   Smoking status: Former    Current packs/day: 0.00    Average packs/day: 0.5 packs/day for 10.0 years (5.0 ttl pk-yrs)    Types: Cigarettes    Start date: 01/25/1973    Quit date: 01/26/1983    Years since quitting: 40.2   Smokeless tobacco: Never   Tobacco comments:    quit 30 years ago  Vaping Use   Vaping status: Never Used  Substance and Sexual Activity   Alcohol use: No    Alcohol/week: 0.0 standard drinks of alcohol  Drug use: No   Sexual activity: Not Currently    Birth control/protection: Post-menopausal  Other Topics Concern   Not on file  Social History Narrative   Regular exercise: walk when ableCaffeine use: occasionally   Right handed   Drinks caffeine prn-coffee daily   One floor home   Lives alone with two cats   retired   [employer] Executive [employer] Strain: Low Risk  (02/13/2021)   Overall Financial Resource Strain (CARDIA)    Difficulty of Paying Living Expenses: Not hard at all  Food Insecurity: No Food Insecurity (01/12/2023)   Hunger Vital Sign    Worried About Running Out of Food in the Last Year: Never true    Ran Out of Food in the Last Year: Never true  Transportation Needs: No Transportation Needs (01/12/2023)   PRAPARE - Administrator, Civil Service (Medical): No    Lack of Transportation (Non-Medical): No  Physical Activity: Inactive (02/13/2021)   Exercise Vital Sign    Days of Exercise per Week: 0 days    Minutes of Exercise per Session: 0 min  Stress: No Stress Concern Present (02/13/2021)   [employer] of Occupational Health -  Occupational Stress Questionnaire    Feeling of Stress : Not at all  Social Connections: Unknown (06/01/2021)   Received from [facility], [facility]   Social Network    Social Network: Not on file    Family History  Problem Relation Age of Onset   Stroke Mother    Thyroid disease Mother    Depression Father    Hypertension Sister    Aneurysm Sister    Diabetes Brother    Heart attack Brother    Hypertension Brother    Heart failure Brother    Thyroid disease Brother    Stroke Maternal Grandmother    Heart failure Maternal Grandmother    Heart failure Maternal Grandfather    Stroke Maternal Grandfather    Heart failure Paternal Grandmother    Cancer - Colon Neg Hx     Outpatient Encounter Medications as of 03/31/2023  Medication Sig   acetaminophen (TYLENOL) 500 MG tablet Take 1,000 mg by mouth every 6 (six) hours as needed for moderate pain or headache.   albuterol (VENTOLIN HFA) 108 (90 Base) MCG/ACT inhaler Inhale 2 puffs into the lungs every 6 (six) hours as needed for wheezing or shortness of breath.   alendronate (FOSAMAX) 70 MG tablet TAKE 1 TABLET EVERY WEEK IN THE MORNING 30 MIN BEFORE EATING WITH AN 8OZ GLASS OF WATER (SIT UP 30 MIN)   atorvastatin (LIPITOR) 80 MG tablet Take 1 tablet (80 mg total) by mouth daily.   benzonatate (TESSALON) 100 MG capsule Take 1 capsule (100 mg total) by mouth 2 (two) times daily as needed for cough.   blood glucose meter kit and supplies KIT Dispense based on patient and insurance preference. Use up to four times daily as directed.   buPROPion (WELLBUTRIN XL) 300 MG 24 hr tablet Take 300 mg by mouth daily.    clonazePAM (KLONOPIN) 0.5 MG tablet Take 0.25-0.5 mg by mouth in the morning, at noon, and at bedtime.   diltiazem (CARDIZEM CD) 240 MG 24 hr capsule TAKE (1) CAPSULE BY MOUTH ONCE DAILY.   donepezil (ARICEPT) 5 MG tablet Take 1 tablet (5 mg total) by mouth at bedtime.   DULoxetine (CYMBALTA) 20 MG capsule Take 20 mg by mouth 2  (two) times daily.   ELIQUIS  5 MG TABS tablet TAKE ONE TABLET BY MOUTH TWICE DAILY   glucose blood (ONETOUCH ULTRA TEST) test strip 1 each by Other route in the morning, at noon, and at bedtime. Use as instructed   Lancets (ONETOUCH DELICA PLUS LANCET33G) MISC USE TO TEST BLOOD SUGAR TWICE DAILY.   metFORMIN (GLUCOPHAGE) 500 MG tablet Take 1 tablet (500 mg total) by mouth 2 (two) times daily with a meal.   mirabegron ER (MYRBETRIQ) 25 MG TB24 tablet Take 1 tablet (25 mg total) by mouth daily.   ondansetron (ZOFRAN) 4 MG tablet Take 1 tablet (4 mg total) by mouth every 8 (eight) hours as needed for nausea or vomiting.   pantoprazole (PROTONIX) 40 MG tablet Take 1 tablet (40 mg total) by mouth daily.   [DISCONTINUED] levothyroxine (SYNTHROID) 125 MCG tablet Take 1 tablet (125 mcg total) by mouth daily before breakfast.   levothyroxine (SYNTHROID) 125 MCG tablet Take 1 tablet (125 mcg total) by mouth daily before breakfast.   losartan (COZAAR) 50 MG tablet Take 1 tablet (50 mg total) by mouth 2 (two) times daily. (Patient not taking: Reported on 03/31/2023)   topiramate (TOPAMAX) 50 MG tablet Take 50 mg by mouth at bedtime.   No facility-administered encounter medications on file as of 03/31/2023.    ALLERGIES: Allergies  Allergen Reactions   Amoxicillin-Pot Clavulanate Rash   Atenolol Rash   Penicillins Rash    Tolerated Cephalosporin Date: 02/17/21.     Sulfonamide Derivatives Rash   VACCINATION STATUS: Immunization History  Administered Date(s) Administered   Fluad Quad(high Dose 65+) 10/30/2020, 10/05/2021   Fluad Trivalent(High Dose 65+) 10/06/2022   Influenza,inj,quad, With Preservative 10/26/2018   Moderna Sars-Covid-2 Vaccination 02/26/2019, 03/19/2019   Pneumococcal Conjugate-13 04/21/2020   Pneumococcal Polysaccharide-23 05/26/2021   Tdap 06/10/2021   Zoster Recombinant(Shingrix) 06/10/2021, 10/07/2021     HPI   [name]  is a patient with the above medical  history. she was diagnosed with hypothyroidism at approximate age of 6 years (after total thyroidectomy- done after abnormal biopsy-determined benign), which required subsequent initiation of thyroid hormone replacement therapy. she was given various doses of Levothyroxine over the years, currently prescribed 137 micrograms.    She does have memory impairment and lives alone.  She states her son lives nearby and daughter lives in [place] and that they were discussing whether or not to hire some help for her in the house 3 days a week.  She does not have any routine schedule with her day.  She receives the majority of her medication in pill packs.  I reviewed patient's thyroid tests:  Lab Results  Component Value Date   TSH 6.090 (H) 03/18/2023   TSH 3.674 01/07/2023   TSH 7.040 (H) 12/13/2022   TSH 0.607 09/09/2022   TSH 10.300 (H) 06/03/2022   TSH 8.640 (H) 03/30/2022   TSH 17.800 (H) 02/01/2022   TSH 1.390 10/05/2021   TSH 0.333 (L) 07/01/2021   TSH 0.716 05/26/2021   FREET4 1.75 03/18/2023   FREET4 1.50 12/13/2022   FREET4 2.07 (H) 09/09/2022   FREET4 1.10 06/03/2022   FREET4 1.45 03/30/2022   FREET4 1.17 02/01/2022   FREET4 1.59 10/05/2021   FREET4 2.25 (H) 07/01/2021   FREET4 1.88 (H) 05/26/2021   FREET4 1.63 01/14/2021    Pt denies feeling nodules in neck, hoarseness, dysphagia/odynophagia, SOB with lying down.  She did have thyroid ultrasound 1 year post total thyroidectomy and no residual tissue was noted.  I reviewed her chart and  she also has a history of OSA, A Flutter, GERD, IBS, dementia, osteoporosis, OA, Bipolar disorder, iron deficiency anemia, HLD and low vitamin D.   ROS:  Constitutional: no weight gain/loss, + fatigue-improved somewhat, no subjective hyperthermia, no subjective hypothermia, + memory impairment (sees neurology for dementia) Eyes: no blurry vision, no xerophthalmia ENT: no sore throat, no nodules palpated in throat, no dysphagia/odynophagia,  no hoarseness Cardiovascular: no chest pain, no SOB, no leg swelling Respiratory: no cough, no SOB Gastrointestinal: no nausea/vomiting/diarrhea, + intermittent constipation Musculoskeletal: no muscle/joint aches Skin: no rashes, + excessive sweating-somewhat improved Neurological: no tremors, no numbness, no tingling, no dizziness Psychiatric: no depression, no anxiety (hx Bipolar disorder- sees psych)   Objective:   Objective     BP 124/72 (BP Location: Left Arm, Patient Position: Sitting, Cuff Size: Large)   Pulse 65   Ht 5\' 1"  (1.549 m)   Wt 185 lb 12.8 oz (84.3 kg)   BMI 35.11 kg/m  Wt Readings from Last 3 Encounters:  03/31/23 185 lb 12.8 oz (84.3 kg)  02/28/23 184 lb (83.5 kg)  02/07/23 180 lb 9.6 oz (81.9 kg)    BP Readings from Last 3 Encounters:  03/31/23 124/72  02/28/23 118/62  02/07/23 134/77      Physical Exam- Limited  Constitutional:  Body mass index is 35.11 kg/m. , not in acute distress, normal state of mind Eyes:  EOMI, no exophthalmos Musculoskeletal: no gross deformities, strength intact in all four extremities, no gross restriction of joint movements Skin:  no rashes, no hyperemia Neurological: no tremor with outstretched hands   CMP ( most recent) CMP     Component Value Date/Time   NA 130 (L) 02/07/2023 1424   K 4.1 02/07/2023 1424   CL 93 (L) 02/07/2023 1424   CO2 21 02/07/2023 1424   GLUCOSE 95 02/07/2023 1424   GLUCOSE 120 (H) 01/11/2023 0131   BUN 5 (L) 02/07/2023 1424   CREATININE 0.62 02/07/2023 1424   CREATININE 0.62 07/23/2014 1548   CALCIUM 9.7 02/07/2023 1424   PROT 6.9 01/20/2023 1139   ALBUMIN 4.6 01/20/2023 1139   AST 16 01/20/2023 1139   ALT 19 01/20/2023 1139   ALKPHOS 105 01/20/2023 1139   BILITOT 0.5 01/20/2023 1139   EGFR 92 02/07/2023 1424   GFRNONAA >60 01/11/2023 0131     Diabetic Labs (most recent): Lab Results  Component Value Date   HGBA1C 6.0 (H) 03/18/2023   HGBA1C 6.0 (H) 02/07/2023   HGBA1C  6.4 (H) 10/06/2022     Lipid Panel ( most recent) Lipid Panel     Component Value Date/Time   CHOL 149 05/26/2021 1619   TRIG 94 05/26/2021 1619   HDL 59 05/26/2021 1619   CHOLHDL 2.5 05/26/2021 1619   LDLCALC 73 05/26/2021 1619   LABVLDL 17 05/26/2021 1619       Lab Results  Component Value Date   TSH 6.090 (H) 03/18/2023   TSH 3.674 01/07/2023   TSH 7.040 (H) 12/13/2022   TSH 0.607 09/09/2022   TSH 10.300 (H) 06/03/2022   TSH 8.640 (H) 03/30/2022   TSH 17.800 (H) 02/01/2022   TSH 1.390 10/05/2021   TSH 0.333 (L) 07/01/2021   TSH 0.716 05/26/2021   FREET4 1.75 03/18/2023   FREET4 1.50 12/13/2022   FREET4 2.07 (H) 09/09/2022   FREET4 1.10 06/03/2022   FREET4 1.45 03/30/2022   FREET4 1.17 02/01/2022   FREET4 1.59 10/05/2021   FREET4 2.25 (H) 07/01/2021   FREET4 1.88 (H) 05/26/2021  FREET4 1.63 01/14/2021     Latest Reference Range & Units 03/30/22 15:23 06/03/22 14:38 09/09/22 15:15 12/13/22 13:06 01/07/23 11:09 03/18/23 11:26  TSH 0.450 - 4.500 uIU/mL 8.640 (H) 10.300 (H) 0.607 7.040 (H) 3.674 6.090 (H)  Triiodothyronine,Free,Serum 2.0 - 4.4 pg/mL 2.8 2.1    2.1  T4,Free(Direct) 0.82 - 1.77 ng/dL 1.61 0.96 0.45 (H) 4.09  1.75  (H): Data is abnormally high  Assessment & Plan:   ASSESSMENT / PLAN:  1. Hypothyroidism- postsurgical  Patient with long-standing hypothyroidism, on levothyroxine therapy.      Her previsit TFTs are consistent with appropriate hormone replacement (TSH is slightly elevated but Free T4 is WNL).  She was recently in the hospital and was on Amiodarone for a brief period of time which may throw thyroid labs off.  For now, she is advised continue her dose of Levothyroxine 125 mcg po daily before breakfast.   He son came with her to today's appt.  Her children help organize her medications given her memory impairment.  - We discussed about correct intake of levothyroxine, at fasting, with water, separated by at least 30 minutes from breakfast,  and separated by more than 4 hours from calcium, iron, multivitamins, acid reflux medications (PPIs). -Patient is made aware of the fact that thyroid hormone replacement is needed for life, dose to be adjusted by periodic monitoring of thyroid function tests.  - Will check thyroid tests before next visit: TSH, free T4.        I spent  30  minutes in the care of the patient today including review of labs from Thyroid Function, CMP, and other relevant labs ; imaging/biopsy records (current and previous including abstractions from other facilities); face-to-face time discussing  her lab results and symptoms, medications doses, her options of short and long term treatment based on the latest standards of care / guidelines;   and documenting the encounter.  Avel Peace  participated in the discussions, expressed understanding, and voiced agreement with the above plans.  All questions were answered to her satisfaction. she is encouraged to contact clinic should she have any questions or concerns prior to her return visit.   FOLLOW UP PLAN:  Return in about 3 months (around 07/01/2023) for Thyroid follow up, Previsit labs.  Ronny Bacon, [facility] [address] [place], [place] [address] Phone: [phone] Fax: [phone]  03/31/2023, 3:18 PM

## 2023-03-31 NOTE — Patient Instructions (Signed)

## 2023-04-01 ENCOUNTER — Encounter: Payer: Self-pay | Admitting: *Deleted

## 2023-04-08 ENCOUNTER — Other Ambulatory Visit: Payer: Self-pay | Admitting: Internal Medicine

## 2023-04-08 DIAGNOSIS — I480 Paroxysmal atrial fibrillation: Secondary | ICD-10-CM

## 2023-04-18 ENCOUNTER — Ambulatory Visit: Payer: Self-pay

## 2023-04-18 ENCOUNTER — Institutional Professional Consult (permissible substitution): Payer: PPO | Admitting: Psychology

## 2023-04-25 ENCOUNTER — Encounter: Payer: PPO | Admitting: Psychology

## 2023-04-27 ENCOUNTER — Encounter: Payer: Self-pay | Admitting: Internal Medicine

## 2023-04-28 ENCOUNTER — Other Ambulatory Visit: Payer: Self-pay | Admitting: Internal Medicine

## 2023-04-28 DIAGNOSIS — N3941 Urge incontinence: Secondary | ICD-10-CM

## 2023-04-29 DIAGNOSIS — M1711 Unilateral primary osteoarthritis, right knee: Secondary | ICD-10-CM | POA: Diagnosis not present

## 2023-05-11 ENCOUNTER — Other Ambulatory Visit: Payer: Self-pay | Admitting: Internal Medicine

## 2023-05-11 DIAGNOSIS — I480 Paroxysmal atrial fibrillation: Secondary | ICD-10-CM

## 2023-05-17 DIAGNOSIS — F332 Major depressive disorder, recurrent severe without psychotic features: Secondary | ICD-10-CM | POA: Diagnosis not present

## 2023-05-17 DIAGNOSIS — F3181 Bipolar II disorder: Secondary | ICD-10-CM | POA: Diagnosis not present

## 2023-05-28 ENCOUNTER — Other Ambulatory Visit: Payer: Self-pay | Admitting: Internal Medicine

## 2023-05-28 DIAGNOSIS — F02B18 Dementia in other diseases classified elsewhere, moderate, with other behavioral disturbance: Secondary | ICD-10-CM

## 2023-06-02 ENCOUNTER — Encounter: Payer: Self-pay | Admitting: Cardiology

## 2023-06-02 ENCOUNTER — Ambulatory Visit: Payer: PPO | Attending: Cardiology | Admitting: Cardiology

## 2023-06-02 VITALS — BP 134/60 | HR 77 | Ht 61.5 in | Wt 188.0 lb

## 2023-06-02 DIAGNOSIS — I10 Essential (primary) hypertension: Secondary | ICD-10-CM | POA: Diagnosis not present

## 2023-06-02 DIAGNOSIS — I480 Paroxysmal atrial fibrillation: Secondary | ICD-10-CM

## 2023-06-02 DIAGNOSIS — I471 Supraventricular tachycardia, unspecified: Secondary | ICD-10-CM

## 2023-06-02 DIAGNOSIS — R931 Abnormal findings on diagnostic imaging of heart and coronary circulation: Secondary | ICD-10-CM

## 2023-06-02 NOTE — Progress Notes (Signed)
 Cardiology Office Note  Date: 06/02/2023   ID: SAVHANNA WITTENBORN, DOB 11/27/1945, MRN [id number]  History of Present Illness: [name] is a 78 y.o. female last seen in February by Ms. Dunn PA-C, I reviewed her note.  She is here for a routine visit.  She does not report any sense of palpitations or unusual shortness of breath.  Still lives in her own home and has regular visits from her son who lives nearby.  We went over her medications.  She states that she has been compliant with therapy, does not report any spontaneous bleeding problems on Eliquis .  She was taken off of amiodarone  since her last visit but otherwise continues on Cardizem  CD.  Physical Exam: VS:  BP 134/60   Pulse 77   Ht 5' 1.5" (1.562 m)   Wt 188 lb (85.3 kg)   SpO2 94%   BMI 34.95 kg/m , BMI Body mass index is 34.95 kg/m.  Wt Readings from Last 3 Encounters:  06/02/23 188 lb (85.3 kg)  03/31/23 185 lb 12.8 oz (84.3 kg)  02/28/23 184 lb (83.5 kg)    General: Patient appears comfortable at rest. HEENT: Conjunctiva and lids normal. Neck: Supple, no elevated JVP or carotid bruits. Lungs: Clear to auscultation, nonlabored breathing at rest. Cardiac: Regular rate and rhythm, no S3, 1/6 systolic murmur, no pericardial rub.  ECG:  An ECG dated 02/28/2023 was personally reviewed today and demonstrated:  Sinus rhythm with incomplete right bundle branch block and left anterior fascicular block.  Labwork: 01/10/2023: Magnesium  1.9 01/20/2023: ALT 19; AST 16; Hemoglobin 14.6; Platelets 566 02/07/2023: BUN 5; Creatinine, Ser 0.62; Potassium 4.1; Sodium 130 03/18/2023: TSH 6.090     Component Value Date/Time   CHOL 149 05/26/2021 1619   TRIG 94 05/26/2021 1619   HDL 59 05/26/2021 1619   CHOLHDL 2.5 05/26/2021 1619   LDLCALC 73 05/26/2021 1619   Other Studies Reviewed Today:  Echocardiogram 01/10/2023:  1. Left ventricular ejection fraction, by estimation, is 55 to 60%. The  left ventricle has normal  function. The left ventricle has no regional  wall motion abnormalities. There is mild concentric left ventricular  hypertrophy. Left ventricular diastolic  parameters are consistent with Grade I diastolic dysfunction (impaired  relaxation).   2. Right ventricular systolic function is normal. The right ventricular  size is mildly enlarged. There is normal pulmonary artery systolic  pressure. The estimated right ventricular systolic pressure is 27.8 mmHg.   3. The mitral valve is normal in structure. No evidence of mitral valve  regurgitation. No evidence of mitral stenosis.   4. The aortic valve is tricuspid. Aortic valve regurgitation is not  visualized. No aortic stenosis is present.   5. The inferior vena cava is normal in size with greater than 50%  respiratory variability, suggesting right atrial pressure of 3 mmHg.   Assessment and Plan:  1.  Paroxysmal atrial fibrillation diagnosed in February 2025 during hospital stay with other comorbid illnesses.  CHA2DS2-VASc score is 5.  She was seen by the EP service and started on amiodarone  for rhythm suppression in the short-term (subsequently discontinued).  At this point she remains symptomatically stable with no obvious recurring palpitations and her heart rate is regular today.  Plan to continue Eliquis  5 mg twice daily for stroke prophylaxis.  She is also on Cardizem  CD 240 mg daily.  2.  PSVT.  No obvious breakthrough episodes on Cardizem  CD 240 mg daily.   3.  Coronary calcium   score of 86 with minimal nonobstructive coronary atherosclerosis by CT imaging in March 2022.  No angina.  Continue Lipitor  80 mg daily.   4.  Primary hypertension.  No changes made to current regimen.  Disposition:  Follow up 6 months.  Signed, Gerard Knight, M.D., F.A.C.C. [facility]

## 2023-06-02 NOTE — Patient Instructions (Signed)
 Medication Instructions:  Your physician recommends that you continue on your current medications as directed. Please refer to the Current Medication list given to you today.   Labwork: None today  Testing/Procedures: None today  Follow-Up: 6 months  Any Other Special Instructions Will Be Listed Below (If Applicable).  If you need a refill on your cardiac medications before your next appointment, please call your pharmacy.

## 2023-06-07 ENCOUNTER — Ambulatory Visit: Payer: PPO | Admitting: Internal Medicine

## 2023-06-10 ENCOUNTER — Other Ambulatory Visit: Payer: Self-pay | Admitting: Internal Medicine

## 2023-06-10 DIAGNOSIS — E1169 Type 2 diabetes mellitus with other specified complication: Secondary | ICD-10-CM

## 2023-06-10 DIAGNOSIS — I480 Paroxysmal atrial fibrillation: Secondary | ICD-10-CM

## 2023-06-30 DIAGNOSIS — E890 Postprocedural hypothyroidism: Secondary | ICD-10-CM | POA: Diagnosis not present

## 2023-07-05 LAB — T4, FREE: Free T4: 1.57 ng/dL (ref 0.82–1.77)

## 2023-07-05 LAB — TSH: TSH: 6.34 u[IU]/mL — ABNORMAL HIGH (ref 0.450–4.500)

## 2023-07-12 ENCOUNTER — Encounter: Payer: Self-pay | Admitting: Nurse Practitioner

## 2023-07-12 ENCOUNTER — Ambulatory Visit: Admitting: Nurse Practitioner

## 2023-07-12 VITALS — BP 116/72 | HR 74 | Ht 61.5 in | Wt 188.4 lb

## 2023-07-12 DIAGNOSIS — E890 Postprocedural hypothyroidism: Secondary | ICD-10-CM | POA: Diagnosis not present

## 2023-07-12 DIAGNOSIS — E038 Other specified hypothyroidism: Secondary | ICD-10-CM

## 2023-07-12 MED ORDER — LEVOTHYROXINE SODIUM 125 MCG PO TABS: 125.0000 ug | ORAL_TABLET | Freq: Every day | ORAL | 1 refills | Status: DC

## 2023-07-12 NOTE — Patient Instructions (Signed)

## 2023-07-12 NOTE — Progress Notes (Signed)
 Endocrinology Follow Up Note                                         07/12/2023, 3:43 PM  Subjective:   Subjective    [name] is a 78 y.o.-year-old female patient being seen in follow up after being seen in consultation for hypothyroidism referred by Meldon Sport, MD.     Past Medical History:  Diagnosis Date   Allergic rhinitis    Arthritis    B12 deficiency    Bipolar disorder (HCC)    Coronary artery disease    Depression    Diabetes mellitus (HCC)    Diabetes mellitus (HCC) 10/05/2021   Essential hypertension    GERD (gastroesophageal reflux disease)    Hashimoto's thyroiditis    History of transient ischemic attack (TIA)    Or possibly migraine as well as right amaurosis fugax and ataxia - Dr. Dania Dupre   Hyperlipidemia    Hypothyroidism    PSVT (paroxysmal supraventricular tachycardia) (HCC)    Right bundle branch block     Past Surgical History:  Procedure Laterality Date   CATARACT EXTRACTION W/PHACO Left 05/23/2020   Procedure: CATARACT EXTRACTION PHACO AND INTRAOCULAR LENS PLACEMENT LEFT EYE;  Surgeon: Tarri Farm, MD;  Location: [facility];  Service: Ophthalmology;  Laterality: Left;  left CDE=15.69   CATARACT EXTRACTION W/PHACO Right 06/13/2020   Procedure: CATARACT EXTRACTION PHACO AND INTRAOCULAR LENS PLACEMENT RIGHT EYE;  Surgeon: Tarri Farm, MD;  Location: [facility];  Service: Ophthalmology;  Laterality: Right;  right CDE=11.33   COLONOSCOPY WITH PROPOFOL  N/A 04/22/2022   Procedure: COLONOSCOPY WITH PROPOFOL ;  Surgeon: Suzette Espy, MD;  Location: [facility];  Service: Endoscopy;  Laterality: N/A;  8:30 am, asa 3   LAPAROSCOPIC CHOLECYSTECTOMY  2010   POLYPECTOMY  04/22/2022   Procedure: POLYPECTOMY INTESTINAL;  Surgeon: Suzette Espy, MD;  Location: [facility];  Service: Endoscopy;;   THYROIDECTOMY  2009   TOTAL KNEE ARTHROPLASTY Left 02/16/2021   Procedure: TOTAL KNEE  ARTHROPLASTY;  Surgeon: Liliane Rei, MD;  Location: [facility];  Service: Orthopedics;  Laterality: Left;    Social History   Socioeconomic History   Marital status: Widowed    Spouse name: Not on file   Number of children: 2   Years of education: 39   Highest education level: Not on file  Occupational History   Occupation: homemaker    Employer: RETIRED  Tobacco Use   Smoking status: Former    Current packs/day: 0.00    Average packs/day: 0.5 packs/day for 10.0 years (5.0 ttl pk-yrs)    Types: Cigarettes    Start date: 01/25/1973    Quit date: 01/26/1983    Years since quitting: 40.4   Smokeless tobacco: Never   Tobacco comments:    quit 30 years ago  Vaping Use   Vaping status: Never Used  Substance and Sexual Activity   Alcohol use: No    Alcohol/week: 0.0 standard drinks of alcohol  Drug use: No   Sexual activity: Not Currently    Birth control/protection: Post-menopausal  Other Topics Concern   Not on file  Social History Narrative   Regular exercise: walk when ableCaffeine use: occasionally   Right handed   Drinks caffeine prn-coffee daily   One floor home   Lives alone with two cats   retired   [employer] Executive [employer] Strain: Low Risk  (02/13/2021)   Overall Financial Resource Strain (CARDIA)    Difficulty of Paying Living Expenses: Not hard at all  Food Insecurity: No Food Insecurity (01/12/2023)   Hunger Vital Sign    Worried About Running Out of Food in the Last Year: Never true    Ran Out of Food in the Last Year: Never true  Transportation Needs: No Transportation Needs (01/12/2023)   PRAPARE - Administrator, Civil Service (Medical): No    Lack of Transportation (Non-Medical): No  Physical Activity: Inactive (02/13/2021)   Exercise Vital Sign    Days of Exercise per Week: 0 days    Minutes of Exercise per Session: 0 min  Stress: No Stress Concern Present (02/13/2021)   [employer] of Occupational Health -  Occupational Stress Questionnaire    Feeling of Stress : Not at all  Social Connections: Unknown (06/01/2021)   Received from [facility]   Social Network    Social Network: Not on file    Family History  Problem Relation Age of Onset   Stroke Mother    Thyroid  disease Mother    Depression Father    Hypertension Sister    Aneurysm Sister    Diabetes Brother    Heart attack Brother    Hypertension Brother    Heart failure Brother    Thyroid  disease Brother    Stroke Maternal Grandmother    Heart failure Maternal Grandmother    Heart failure Maternal Grandfather    Stroke Maternal Grandfather    Heart failure Paternal Grandmother    Cancer - Colon Neg Hx     Outpatient Encounter Medications as of 07/12/2023  Medication Sig   acetaminophen  (TYLENOL ) 500 MG tablet Take 1,000 mg by mouth every 6 (six) hours as needed for moderate pain or headache.   albuterol  (VENTOLIN  HFA) 108 (90 Base) MCG/ACT inhaler Inhale 2 puffs into the lungs every 6 (six) hours as needed for wheezing or shortness of breath.   alendronate  (FOSAMAX ) 70 MG tablet TAKE 1 TABLET EVERY WEEK IN THE MORNING 30 MIN BEFORE EATING WITH AN 8OZ GLASS OF WATER  (SIT UP 30 MIN)   atorvastatin  (LIPITOR ) 80 MG tablet Take 1 tablet (80 mg total) by mouth daily.   benzonatate  (TESSALON ) 100 MG capsule Take 1 capsule (100 mg total) by mouth 2 (two) times daily as needed for cough.   blood glucose meter kit and supplies KIT Dispense based on patient and insurance preference. Use up to four times daily as directed.   buPROPion  (WELLBUTRIN  XL) 300 MG 24 hr tablet Take 300 mg by mouth daily.    clonazePAM  (KLONOPIN ) 0.5 MG tablet Take 0.25-0.5 mg by mouth in the morning, at noon, and at bedtime.   diltiazem  (CARDIZEM  CD) 240 MG 24 hr capsule TAKE (1) CAPSULE BY MOUTH ONCE DAILY.   donepezil  (ARICEPT ) 5 MG tablet TAKE ONE TABLET BY MOUTH AT BEDTIME   DULoxetine  (CYMBALTA ) 20 MG capsule Take 20 mg by mouth 2 (two) times daily.    ELIQUIS  5 MG TABS tablet TAKE  ONE TABLET BY MOUTH TWICE DAILY   glucose blood (ONETOUCH ULTRA TEST) test strip 1 each by Other route in the morning, at noon, and at bedtime. Use as instructed   Lancets (ONETOUCH DELICA PLUS LANCET33G) MISC USE TO TEST BLOOD SUGAR TWICE DAILY.   metFORMIN  (GLUCOPHAGE ) 500 MG tablet Take 1 tablet (500 mg total) by mouth 2 (two) times daily with a meal.   MYRBETRIQ  25 MG TB24 tablet TAKE ONE TABLET BY MOUTH EVERY DAY   ondansetron  (ZOFRAN ) 4 MG tablet Take 1 tablet (4 mg total) by mouth every 8 (eight) hours as needed for nausea or vomiting.   pantoprazole  (PROTONIX ) 40 MG tablet Take 1 tablet (40 mg total) by mouth daily.   [DISCONTINUED] levothyroxine  (SYNTHROID ) 125 MCG tablet Take 1 tablet (125 mcg total) by mouth daily before breakfast.   levothyroxine  (SYNTHROID ) 125 MCG tablet Take 1 tablet (125 mcg total) by mouth daily before breakfast.   No facility-administered encounter medications on file as of 07/12/2023.    ALLERGIES: Allergies  Allergen Reactions   Amoxicillin-Pot Clavulanate Rash   Atenolol  Rash   Penicillins Rash    Tolerated Cephalosporin Date: 02/17/21.     Sulfonamide Derivatives Rash   VACCINATION STATUS: Immunization History  Administered Date(s) Administered   Fluad Quad(high Dose 65+) 10/30/2020, 10/05/2021   Fluad Trivalent(High Dose 65+) 10/06/2022   Influenza,inj,quad, With Preservative 10/26/2018   Moderna Sars-Covid-2 Vaccination 02/26/2019, 03/19/2019   Pneumococcal Conjugate-13 04/21/2020   Pneumococcal Polysaccharide-23 05/26/2021   Tdap 06/10/2021   Zoster Recombinant(Shingrix) 06/10/2021, 10/07/2021     HPI   [name]  is a patient with the above medical history. she was diagnosed with hypothyroidism at approximate age of 56 years (after total thyroidectomy- done after abnormal biopsy-determined benign), which required subsequent initiation of thyroid  hormone replacement therapy. she was given various  doses of Levothyroxine  over the years, currently prescribed 125 micrograms.    She does have memory impairment and lives alone.  She states her son lives nearby and daughter lives in [place] and that they were discussing whether or not to hire some help for her in the house 3 days a week.  She does not have any routine schedule with her day.  She receives the majority of her medication in pill packs.  I reviewed patient's thyroid  tests:  Lab Results  Component Value Date   TSH 6.340 (H) 06/30/2023   TSH 6.090 (H) 03/18/2023   TSH 3.674 01/07/2023   TSH 7.040 (H) 12/13/2022   TSH 0.607 09/09/2022   TSH 10.300 (H) 06/03/2022   TSH 8.640 (H) 03/30/2022   TSH 17.800 (H) 02/01/2022   TSH 1.390 10/05/2021   TSH 0.333 (L) 07/01/2021   FREET4 1.57 06/30/2023   FREET4 1.75 03/18/2023   FREET4 1.50 12/13/2022   FREET4 2.07 (H) 09/09/2022   FREET4 1.10 06/03/2022   FREET4 1.45 03/30/2022   FREET4 1.17 02/01/2022   FREET4 1.59 10/05/2021   FREET4 2.25 (H) 07/01/2021   FREET4 1.88 (H) 05/26/2021    Pt denies feeling nodules in neck, hoarseness, dysphagia/odynophagia, SOB with lying down.  She did have thyroid  ultrasound 1 year post total thyroidectomy and no residual tissue was noted.  I reviewed her chart and she also has a history of OSA, A Flutter, GERD, IBS, dementia, osteoporosis, OA, Bipolar disorder, iron deficiency anemia, HLD and low vitamin D.  ROS:  Constitutional: no weight gain/loss, + fatigue-improved somewhat, no subjective hyperthermia, no subjective hypothermia, + memory impairment (sees neurology for dementia)-  now has aide in the home Eyes: no blurry vision, no xerophthalmia ENT: no sore throat, no nodules palpated in throat, no dysphagia/odynophagia, no hoarseness Cardiovascular: no chest pain, no SOB, no leg swelling Respiratory: no cough, no SOB Gastrointestinal: no nausea/vomiting/diarrhea, + intermittent constipation Musculoskeletal: no muscle/joint aches Skin:  no rashes, + excessive sweating-somewhat improved Neurological: no tremors, no numbness, no tingling, no dizziness Psychiatric: no depression, no anxiety (hx Bipolar disorder- sees psych)   Objective:   Objective     BP 116/72 (BP Location: Right Arm, Patient Position: Sitting, Cuff Size: Large)   Pulse 74   Ht 5' 1.5 (1.562 m)   Wt 188 lb 6.4 oz (85.5 kg)   BMI 35.02 kg/m  Wt Readings from Last 3 Encounters:  07/12/23 188 lb 6.4 oz (85.5 kg)  06/02/23 188 lb (85.3 kg)  03/31/23 185 lb 12.8 oz (84.3 kg)    BP Readings from Last 3 Encounters:  07/12/23 116/72  06/02/23 134/60  03/31/23 124/72      Physical Exam- Limited  Constitutional:  Body mass index is 35.02 kg/m. , not in acute distress, normal state of mind Eyes:  EOMI, no exophthalmos Musculoskeletal: no gross deformities, strength intact in all four extremities, no gross restriction of joint movements Skin:  no rashes, no hyperemia Neurological: no tremor with outstretched hands   CMP ( most recent) CMP     Component Value Date/Time   NA 130 (L) 02/07/2023 1424   K 4.1 02/07/2023 1424   CL 93 (L) 02/07/2023 1424   CO2 21 02/07/2023 1424   GLUCOSE 95 02/07/2023 1424   GLUCOSE 120 (H) 01/11/2023 0131   BUN 5 (L) 02/07/2023 1424   CREATININE 0.62 02/07/2023 1424   CREATININE 0.62 07/23/2014 1548   CALCIUM  9.7 02/07/2023 1424   PROT 6.9 01/20/2023 1139   ALBUMIN 4.6 01/20/2023 1139   AST 16 01/20/2023 1139   ALT 19 01/20/2023 1139   ALKPHOS 105 01/20/2023 1139   BILITOT 0.5 01/20/2023 1139   EGFR 92 02/07/2023 1424   GFRNONAA >60 01/11/2023 0131     Diabetic Labs (most recent): Lab Results  Component Value Date   HGBA1C 6.0 (H) 03/18/2023   HGBA1C 6.0 (H) 02/07/2023   HGBA1C 6.4 (H) 10/06/2022     Lipid Panel ( most recent) Lipid Panel     Component Value Date/Time   CHOL 149 05/26/2021 1619   TRIG 94 05/26/2021 1619   HDL 59 05/26/2021 1619   CHOLHDL 2.5 05/26/2021 1619   LDLCALC 73  05/26/2021 1619   LABVLDL 17 05/26/2021 1619       Lab Results  Component Value Date   TSH 6.340 (H) 06/30/2023   TSH 6.090 (H) 03/18/2023   TSH 3.674 01/07/2023   TSH 7.040 (H) 12/13/2022   TSH 0.607 09/09/2022   TSH 10.300 (H) 06/03/2022   TSH 8.640 (H) 03/30/2022   TSH 17.800 (H) 02/01/2022   TSH 1.390 10/05/2021   TSH 0.333 (L) 07/01/2021   FREET4 1.57 06/30/2023   FREET4 1.75 03/18/2023   FREET4 1.50 12/13/2022   FREET4 2.07 (H) 09/09/2022   FREET4 1.10 06/03/2022   FREET4 1.45 03/30/2022   FREET4 1.17 02/01/2022   FREET4 1.59 10/05/2021   FREET4 2.25 (H) 07/01/2021   FREET4 1.88 (H) 05/26/2021     Latest Reference Range & Units 06/03/22 14:38 09/09/22 15:15 12/13/22 13:06 01/07/23 11:09 03/18/23 11:26 06/30/23 10:38  TSH 0.450 - 4.500 uIU/mL 10.300 (H) 0.607 7.040 (H) 3.674 6.090 (H) 6.340 (  H)  Triiodothyronine,Free,Serum 2.0 - 4.4 pg/mL 2.1    2.1   T4,Free(Direct) 0.82 - 1.77 ng/dL 1.61 0.96 (H) 0.45  4.09 1.57  (H): Data is abnormally high  Assessment & Plan:   ASSESSMENT / PLAN:  1. Hypothyroidism- postsurgical  Patient with long-standing hypothyroidism, on levothyroxine  therapy.      Her previsit TFTs are consistent with appropriate hormone replacement (TSH is slightly elevated but Free T4 is on upper end of normal suggesting she would not tolerate a dose increase- and historically she did not tolerate the 137 mcg). She is advised to continue Levothyroxine  125 mcg po daily before breakfast.  Her children help organize her medications given her memory impairment and they have hired help for her as well.  - We discussed about correct intake of levothyroxine , at fasting, with water , separated by at least 30 minutes from breakfast, and separated by more than 4 hours from calcium , iron, multivitamins, acid reflux medications (PPIs). -Patient is made aware of the fact that thyroid  hormone replacement is needed for life, dose to be adjusted by periodic monitoring of  thyroid  function tests.  - Will check thyroid  tests before next visit: TSH, free T4.       I spent  18  minutes in the care of the patient today including review of labs from Thyroid  Function, CMP, and other relevant labs ; imaging/biopsy records (current and previous including abstractions from other facilities); face-to-face time discussing  her lab results and symptoms, medications doses, her options of short and long term treatment based on the latest standards of care / guidelines;   and documenting the encounter.  Kathleen Papa  participated in the discussions, expressed understanding, and voiced agreement with the above plans.  All questions were answered to her satisfaction. she is encouraged to contact clinic should she have any questions or concerns prior to her return visit.   FOLLOW UP PLAN:  Return in about 4 months (around 11/11/2023) for Thyroid  follow up, Previsit labs.  Hulon Magic, [facility] [address] [place], [place] [address] Phone: [phone] Fax: [phone]  07/12/2023, 3:43 PM

## 2023-07-19 ENCOUNTER — Ambulatory Visit: Admitting: Internal Medicine

## 2023-08-10 ENCOUNTER — Telehealth: Payer: Self-pay

## 2023-08-10 NOTE — Telephone Encounter (Signed)
 Copied from CRM [id number]. Topic: Clinical - Medication Question >> Aug 10, 2023 12:50 PM Geneva B wrote: Reason for CRM: [facility] needs list of all pt rx please call [phone]  or fax [phone] ask for robin

## 2023-08-10 NOTE — Telephone Encounter (Signed)
 Med list printed and faxed to [facility].

## 2023-08-16 ENCOUNTER — Ambulatory Visit: Admitting: Internal Medicine

## 2023-08-16 ENCOUNTER — Encounter: Payer: Self-pay | Admitting: Internal Medicine

## 2023-08-16 VITALS — BP 139/82 | HR 67 | Ht 61.5 in | Wt 194.0 lb

## 2023-08-16 DIAGNOSIS — G4733 Obstructive sleep apnea (adult) (pediatric): Secondary | ICD-10-CM | POA: Diagnosis not present

## 2023-08-16 DIAGNOSIS — F02B18 Dementia in other diseases classified elsewhere, moderate, with other behavioral disturbance: Secondary | ICD-10-CM | POA: Diagnosis not present

## 2023-08-16 DIAGNOSIS — G309 Alzheimer's disease, unspecified: Secondary | ICD-10-CM

## 2023-08-16 DIAGNOSIS — M810 Age-related osteoporosis without current pathological fracture: Secondary | ICD-10-CM | POA: Diagnosis not present

## 2023-08-16 DIAGNOSIS — I480 Paroxysmal atrial fibrillation: Secondary | ICD-10-CM | POA: Diagnosis not present

## 2023-08-16 DIAGNOSIS — E1169 Type 2 diabetes mellitus with other specified complication: Secondary | ICD-10-CM | POA: Diagnosis not present

## 2023-08-16 DIAGNOSIS — E890 Postprocedural hypothyroidism: Secondary | ICD-10-CM | POA: Diagnosis not present

## 2023-08-16 DIAGNOSIS — F3170 Bipolar disorder, currently in remission, most recent episode unspecified: Secondary | ICD-10-CM | POA: Diagnosis not present

## 2023-08-16 DIAGNOSIS — I10 Essential (primary) hypertension: Secondary | ICD-10-CM

## 2023-08-16 DIAGNOSIS — E782 Mixed hyperlipidemia: Secondary | ICD-10-CM

## 2023-08-16 NOTE — Assessment & Plan Note (Addendum)
 Has run out of alendronate  now We will check DEXA scan Advised to continue vitamin D supplement

## 2023-08-16 NOTE — Assessment & Plan Note (Addendum)
 Currently in sinus rhythm Amiodarone  has been discontinued now On diltiazem  240 mg for rate control On Eliquis  5 mg BID for AC Followed by cardiology

## 2023-08-16 NOTE — Assessment & Plan Note (Signed)
MMSE: 24/30 Has had episodes of confusion, could be from multiple psychiatric medicines On donepezil 5 mg daily Referred to neurology, but could not pursue further workup due to transportation issue to [place].

## 2023-08-16 NOTE — Assessment & Plan Note (Signed)
 Followed by Dr. Betti Cruz On Wellbutrin and Cymbalta On clonazepam as needed

## 2023-08-16 NOTE — Assessment & Plan Note (Signed)
 Lab Results  Component Value Date   HGBA1C 6.0 (H) 03/18/2023   Associated with HTN and HLD Well-controlled On Metformin  500 mh BID Advised to check her blood glucose when she has episodes of sweating and facial flushing-could be hypoglycemia Advised to follow diabetic diet On ARB and statin F/u CMP and lipid panel Diabetic eye exam: Advised to follow up with Ophthalmology for diabetic eye exam

## 2023-08-16 NOTE — Assessment & Plan Note (Signed)
Has snoring, daytime fatigue and an apneic episode STOP-BANG: 6 At high risk of moderate to severe OSA Ordered home sleep study in the past, but she prefers not to do it currently -she is initially preferred sleep study in sleep lab, but later canceled referral to sleep specialist Focus on weight loss by following low-carb diet

## 2023-08-16 NOTE — Assessment & Plan Note (Signed)
 On atorvastatin 80 mg QD ?

## 2023-08-16 NOTE — Assessment & Plan Note (Addendum)
 Lab Results  Component Value Date   TSH 6.340 (H) 06/30/2023   With free T4 WNL On Levothyroxine  125 mcg QD Followed by endocrinology

## 2023-08-16 NOTE — Progress Notes (Signed)
 Established Patient Office Visit  Subjective:  Patient ID: [name], female    DOB: 04/01/1945  Age: 78 y.o. MRN: [id number]  CC:  Chief Complaint  Patient presents with   Diabetes   Hypertension   Fatigue    Pt reports sx of fatigue.     HPI [name] is a 78 y.o. female with past medical history of HTN, PSVT, GERD, hypothyroidism, HLD, mood disorder, OA and obesity who presents for f/u of her chronic medical conditions.  Hypothyroidism: She has been taking levothyroxine  125 mcg QD regularly. She had been having excessive sweating for the last few months, which has improved since decreasing doses of levothyroxine  and Cymbalta .  She reports chronic fatigue as well. Denies any recent change in appetite or weight.  Denies any tremors or palpitations currently.  Denies any fever, chills or LAD.   HTN: BP is well-controlled.  Patient denies headache, dizziness, chest pain, dyspnea or palpitations.  Atrial fibrillation: She developed atrial fibrillation during hospitalization in 03/24, was placed on amiodarone  and Eliquis  initially.  She takes diltiazem  240 mg QD and Eliquis  5 mg twice daily now.   Type II DM: Her last HbA1c was 6.0 in 02/25.  She is on metformin  and has been tolerating it well.  Denies any polyuria or polyphagia currently.  Bipolar disorder: She is currently on Cymbalta  and Wellbutrin .  She also takes Klonopin  for anxiety.  Followed by psychiatry.  She has chronic fatigue, but denies insomnia, SI or HI currently.  Denies any recent manic episodes.   She reports memory concerns.  Has had episodes of confusion while driving in her neighborhood, and has almost got lost in her neighborhood.  She has had instances when she got ready for her doctors appointment on wrong days.  She has been taking donepezil  now.  She has been evaluated by neurology, but could not pursue further workup due to transportation issue to [place]. She is currently independent for ADLs.   She lives by herself.  She relies on her brother for transportation.   Past Medical History:  Diagnosis Date   Allergic rhinitis    Arthritis    B12 deficiency    Bipolar disorder (HCC)    Coronary artery disease    Depression    Diabetes mellitus (HCC)    Diabetes mellitus (HCC) 10/05/2021   Essential hypertension    GERD (gastroesophageal reflux disease)    Hashimoto's thyroiditis    History of transient ischemic attack (TIA)    Or possibly migraine as well as right amaurosis fugax and ataxia - Dr. Maurice   Hyperlipidemia    Hypothyroidism    PSVT (paroxysmal supraventricular tachycardia) (HCC)    Right bundle branch block     Past Surgical History:  Procedure Laterality Date   CATARACT EXTRACTION W/PHACO Left 05/23/2020   Procedure: CATARACT EXTRACTION PHACO AND INTRAOCULAR LENS PLACEMENT LEFT EYE;  Surgeon: Harrie Agent, MD;  Location: [facility];  Service: Ophthalmology;  Laterality: Left;  left CDE=15.69   CATARACT EXTRACTION W/PHACO Right 06/13/2020   Procedure: CATARACT EXTRACTION PHACO AND INTRAOCULAR LENS PLACEMENT RIGHT EYE;  Surgeon: Harrie Agent, MD;  Location: [facility];  Service: Ophthalmology;  Laterality: Right;  right CDE=11.33   COLONOSCOPY WITH PROPOFOL  N/A 04/22/2022   Procedure: COLONOSCOPY WITH PROPOFOL ;  Surgeon: Shaaron Lamar HERO, MD;  Location: [facility];  Service: Endoscopy;  Laterality: N/A;  8:30 am, asa 3   LAPAROSCOPIC CHOLECYSTECTOMY  2010   POLYPECTOMY  04/22/2022  Procedure: POLYPECTOMY INTESTINAL;  Surgeon: Shaaron Lamar HERO, MD;  Location: [facility];  Service: Endoscopy;;   THYROIDECTOMY  2009   TOTAL KNEE ARTHROPLASTY Left 02/16/2021   Procedure: TOTAL KNEE ARTHROPLASTY;  Surgeon: Melodi Lerner, MD;  Location: [facility];  Service: Orthopedics;  Laterality: Left;    Family History  Problem Relation Age of Onset   Stroke Mother    Thyroid  disease Mother    Depression Father    Hypertension Sister    Aneurysm Sister    Diabetes Brother     Heart attack Brother    Hypertension Brother    Heart failure Brother    Thyroid  disease Brother    Stroke Maternal Grandmother    Heart failure Maternal Grandmother    Heart failure Maternal Grandfather    Stroke Maternal Grandfather    Heart failure Paternal Grandmother    Cancer - Colon Neg Hx     Social History   Socioeconomic History   Marital status: Widowed    Spouse name: Not on file   Number of children: 2   Years of education: 86   Highest education level: Not on file  Occupational History   Occupation: homemaker    Employer: RETIRED  Tobacco Use   Smoking status: Former    Current packs/day: 0.00    Average packs/day: 0.5 packs/day for 10.0 years (5.0 ttl pk-yrs)    Types: Cigarettes    Start date: 01/25/1973    Quit date: 01/26/1983    Years since quitting: 40.5   Smokeless tobacco: Never   Tobacco comments:    quit 30 years ago  Vaping Use   Vaping status: Never Used  Substance and Sexual Activity   Alcohol use: No    Alcohol/week: 0.0 standard drinks of alcohol   Drug use: No   Sexual activity: Not Currently    Birth control/protection: Post-menopausal  Other Topics Concern   Not on file  Social History Narrative   Regular exercise: walk when ableCaffeine use: occasionally   Right handed   Drinks caffeine prn-coffee daily   One floor home   Lives alone with two cats   retired   Chief Executive Officer Drivers of Corporate investment banker Strain: Low Risk  (02/13/2021)   Overall Financial Resource Strain (CARDIA)    Difficulty of Paying Living Expenses: Not hard at all  Food Insecurity: No Food Insecurity (01/12/2023)   Hunger Vital Sign    Worried About Running Out of Food in the Last Year: Never true    Ran Out of Food in the Last Year: Never true  Transportation Needs: No Transportation Needs (01/12/2023)   PRAPARE - Administrator, Civil Service (Medical): No    Lack of Transportation (Non-Medical): No  Physical Activity: Inactive (02/13/2021)    Exercise Vital Sign    Days of Exercise per Week: 0 days    Minutes of Exercise per Session: 0 min  Stress: No Stress Concern Present (02/13/2021)   [employer] of Occupational Health - Occupational Stress Questionnaire    Feeling of Stress : Not at all  Social Connections: Unknown (06/01/2021)   [facility]   Social Network    Social Network: Not on file  Intimate Partner Violence: Patient Unable To Answer (01/12/2023)   Humiliation, Afraid, Rape, and Kick questionnaire    Fear of Current or Ex-Partner: Patient unable to answer    Emotionally Abused: Patient unable to answer    Physically Abused: Patient unable to  answer    Sexually Abused: Patient unable to answer    Outpatient Medications Prior to Visit  Medication Sig Dispense Refill   acetaminophen  (TYLENOL ) 500 MG tablet Take 1,000 mg by mouth every 6 (six) hours as needed for moderate pain or headache.     albuterol  (VENTOLIN  HFA) 108 (90 Base) MCG/ACT inhaler Inhale 2 puffs into the lungs every 6 (six) hours as needed for wheezing or shortness of breath. 18 g 0   alendronate  (FOSAMAX ) 70 MG tablet TAKE 1 TABLET EVERY WEEK IN THE MORNING 30 MIN BEFORE EATING WITH AN 8OZ GLASS OF WATER  (SIT UP 30 MIN) 4 tablet 11   atorvastatin  (LIPITOR ) 80 MG tablet Take 1 tablet (80 mg total) by mouth daily. 90 tablet 3   benzonatate  (TESSALON ) 100 MG capsule Take 1 capsule (100 mg total) by mouth 2 (two) times daily as needed for cough. 20 capsule 0   blood glucose meter kit and supplies KIT Dispense based on patient and insurance preference. Use up to four times daily as directed. 1 each 0   buPROPion  (WELLBUTRIN  XL) 300 MG 24 hr tablet Take 300 mg by mouth daily.      clonazePAM  (KLONOPIN ) 0.5 MG tablet Take 0.25-0.5 mg by mouth in the morning, at noon, and at bedtime.     diltiazem  (CARDIZEM  CD) 240 MG 24 hr capsule TAKE (1) CAPSULE BY MOUTH ONCE DAILY. 90 capsule 3   donepezil  (ARICEPT ) 5 MG tablet TAKE ONE TABLET BY  MOUTH AT BEDTIME 90 tablet 1   DULoxetine  (CYMBALTA ) 20 MG capsule Take 20 mg by mouth 2 (two) times daily.     ELIQUIS  5 MG TABS tablet TAKE ONE TABLET BY MOUTH TWICE DAILY 60 tablet 5   glucose blood (ONETOUCH ULTRA TEST) test strip 1 each by Other route in the morning, at noon, and at bedtime. Use as instructed 100 strip 11   Lancets (ONETOUCH DELICA PLUS LANCET33G) MISC USE TO TEST BLOOD SUGAR TWICE DAILY. 100 each 0   levothyroxine  (SYNTHROID ) 125 MCG tablet Take 1 tablet (125 mcg total) by mouth daily before breakfast. 90 tablet 1   metFORMIN  (GLUCOPHAGE ) 500 MG tablet Take 1 tablet (500 mg total) by mouth 2 (two) times daily with a meal. 60 tablet 5   MYRBETRIQ  25 MG TB24 tablet TAKE ONE TABLET BY MOUTH EVERY DAY 30 tablet 5   ondansetron  (ZOFRAN ) 4 MG tablet Take 1 tablet (4 mg total) by mouth every 8 (eight) hours as needed for nausea or vomiting. 20 tablet 0   pantoprazole  (PROTONIX ) 40 MG tablet Take 1 tablet (40 mg total) by mouth daily. 90 tablet 3   No facility-administered medications prior to visit.    Allergies  Allergen Reactions   Amoxicillin-Pot Clavulanate Rash   Atenolol  Rash   Penicillins Rash    Tolerated Cephalosporin Date: 02/17/21.     Sulfonamide Derivatives Rash    ROS Review of Systems  Constitutional:  Positive for fatigue. Negative for chills and fever.  HENT:  Negative for congestion, sinus pressure and sinus pain.   Eyes:  Negative for pain and discharge.  Respiratory:  Negative for cough and shortness of breath.   Cardiovascular:  Negative for chest pain and palpitations.  Gastrointestinal:  Negative for diarrhea, nausea and vomiting.  Genitourinary:  Negative for dysuria and hematuria.  Musculoskeletal:  Negative for neck pain and neck stiffness.  Skin:  Negative for rash.  Neurological:  Negative for dizziness and syncope.  Psychiatric/Behavioral:  Positive for confusion.  Negative for agitation and behavioral problems. The patient is  nervous/anxious.       Objective:    Physical Exam Vitals reviewed.  Constitutional:      General: She is not in acute distress.    Appearance: She is obese. She is not diaphoretic.  HENT:     Head: Normocephalic and atraumatic.     Nose: No congestion.     Mouth/Throat:     Mouth: Mucous membranes are moist.     Pharynx: No posterior oropharyngeal erythema.  Eyes:     General: No scleral icterus.    Extraocular Movements: Extraocular movements intact.  Cardiovascular:     Rate and Rhythm: Normal rate and regular rhythm.     Heart sounds: Normal heart sounds. No murmur heard. Pulmonary:     Breath sounds: Normal breath sounds. No wheezing or rales.  Musculoskeletal:     Cervical back: Neck supple. No tenderness.     Right lower leg: No edema.     Left lower leg: No edema.  Skin:    General: Skin is warm.     Findings: No rash.  Neurological:     General: No focal deficit present.     Mental Status: She is alert and oriented to person, place, and time.     Sensory: No sensory deficit.     Motor: No weakness.  Psychiatric:        Mood and Affect: Mood normal.        Behavior: Behavior normal.     BP 139/82   Pulse 67   Ht 5' 1.5 (1.562 m)   Wt 194 lb (88 kg)   SpO2 94%   BMI 36.06 kg/m  Wt Readings from Last 3 Encounters:  08/16/23 194 lb (88 kg)  07/12/23 188 lb 6.4 oz (85.5 kg)  06/02/23 188 lb (85.3 kg)    Lab Results  Component Value Date   TSH 6.340 (H) 06/30/2023   Lab Results  Component Value Date   WBC 12.2 (H) 01/20/2023   HGB 14.6 01/20/2023   HCT 42.7 01/20/2023   MCV 96 01/20/2023   PLT 566 (H) 01/20/2023   Lab Results  Component Value Date   NA 130 (L) 02/07/2023   K 4.1 02/07/2023   CO2 21 02/07/2023   GLUCOSE 95 02/07/2023   BUN 5 (L) 02/07/2023   CREATININE 0.62 02/07/2023   BILITOT 0.5 01/20/2023   ALKPHOS 105 01/20/2023   AST 16 01/20/2023   ALT 19 01/20/2023   PROT 6.9 01/20/2023   ALBUMIN 4.6 01/20/2023   CALCIUM  9.7  02/07/2023   ANIONGAP 11 01/11/2023   EGFR 92 02/07/2023   Lab Results  Component Value Date   CHOL 149 05/26/2021   Lab Results  Component Value Date   HDL 59 05/26/2021   Lab Results  Component Value Date   LDLCALC 73 05/26/2021   Lab Results  Component Value Date   TRIG 94 05/26/2021   Lab Results  Component Value Date   CHOLHDL 2.5 05/26/2021   Lab Results  Component Value Date   HGBA1C 6.0 (H) 03/18/2023      Assessment & Plan:   Problem List Items Addressed This Visit       Cardiovascular and Mediastinum   Essential hypertension   BP Readings from Last 1 Encounters:  08/16/23 139/82   Well-controlled with Diltiazem  240 mg once daily Losartan  was discontinued in the recent hospitalization due to hyponatremia Counseled for compliance with the medications  Advised DASH diet and moderate exercise/walking as tolerated      Paroxysmal atrial fibrillation (HCC)   Currently in sinus rhythm Amiodarone  has been discontinued now On diltiazem  240 mg for rate control On Eliquis  5 mg BID for AC Followed by cardiology      Relevant Orders   CBC with Differential/Platelet     Respiratory   OSA (obstructive sleep apnea)   Has snoring, daytime fatigue and an apneic episode STOP-BANG: 6 At high risk of moderate to severe OSA Ordered home sleep study in the past, but she prefers not to do it currently -she is initially preferred sleep study in sleep lab, but later canceled referral to sleep specialist Focus on weight loss by following low-carb diet        Endocrine   Postoperative hypothyroidism   Lab Results  Component Value Date   TSH 6.340 (H) 06/30/2023   With free T4 WNL On Levothyroxine  125 mcg QD Followed by endocrinology      Type 2 diabetes mellitus with other specified complication (HCC) - Primary   Lab Results  Component Value Date   HGBA1C 6.0 (H) 03/18/2023   Associated with HTN and HLD Well-controlled On Metformin  500 mh BID Advised  to check her blood glucose when she has episodes of sweating and facial flushing-could be hypoglycemia Advised to follow diabetic diet On ARB and statin F/u CMP and lipid panel Diabetic eye exam: Advised to follow up with Ophthalmology for diabetic eye exam      Relevant Orders   CMP14+EGFR   Hemoglobin A1c   Urine Microalbumin w/creat. ratio     Nervous and Auditory   Dementia   MMSE: 24/30 Has had episodes of confusion, could be from multiple psychiatric medicines On donepezil  5 mg daily Referred to neurology, but could not pursue further workup due to transportation issue to [place].         Musculoskeletal and Integument   Age-related osteoporosis without current pathological fracture   Has run out of alendronate  now We will check DEXA scan Advised to continue vitamin D supplement      Relevant Orders   DG Bone Density     Other   Bipolar disorder (HCC) (Chronic)   Followed by Dr. Jess On Wellbutrin  and Cymbalta  On clonazepam  as needed      Hyperlipidemia   On atorvastatin  80 mg QD      Relevant Orders   Lipid Profile      No orders of the defined types were placed in this encounter.   Follow-up: Return in about 4 months (around 12/17/2023) for Annual physical.    Suzzane MARLA Blanch, MD

## 2023-08-16 NOTE — Assessment & Plan Note (Signed)
 BP Readings from Last 1 Encounters:  08/16/23 139/82   Well-controlled with Diltiazem  240 mg once daily Losartan  was discontinued in the recent hospitalization due to hyponatremia Counseled for compliance with the medications Advised DASH diet and moderate exercise/walking as tolerated

## 2023-08-16 NOTE — Patient Instructions (Signed)
Please continue to take medications as prescribed.  Please continue to follow low carb diet and ambulate as tolerated. 

## 2023-08-17 ENCOUNTER — Ambulatory Visit: Payer: Self-pay | Admitting: Internal Medicine

## 2023-08-17 DIAGNOSIS — E1169 Type 2 diabetes mellitus with other specified complication: Secondary | ICD-10-CM | POA: Diagnosis not present

## 2023-08-17 LAB — CBC WITH DIFFERENTIAL/PLATELET
Basophils Absolute: 0 x10E3/uL (ref 0.0–0.2)
Basos: 0 %
EOS (ABSOLUTE): 0.1 x10E3/uL (ref 0.0–0.4)
Eos: 1 %
Hematocrit: 42.1 % (ref 34.0–46.6)
Hemoglobin: 14 g/dL (ref 11.1–15.9)
Immature Grans (Abs): 0.1 x10E3/uL (ref 0.0–0.1)
Immature Granulocytes: 1 %
Lymphocytes Absolute: 2.1 x10E3/uL (ref 0.7–3.1)
Lymphs: 18 %
MCH: 32.1 pg (ref 26.6–33.0)
MCHC: 33.3 g/dL (ref 31.5–35.7)
MCV: 97 fL (ref 79–97)
Monocytes Absolute: 1 x10E3/uL — ABNORMAL HIGH (ref 0.1–0.9)
Monocytes: 8 %
Neutrophils Absolute: 8.6 x10E3/uL — ABNORMAL HIGH (ref 1.4–7.0)
Neutrophils: 72 %
Platelets: 412 x10E3/uL (ref 150–450)
RBC: 4.36 x10E6/uL (ref 3.77–5.28)
RDW: 12.8 % (ref 11.7–15.4)
WBC: 11.9 x10E3/uL — ABNORMAL HIGH (ref 3.4–10.8)

## 2023-08-17 LAB — CMP14+EGFR
ALT: 23 IU/L (ref 0–32)
AST: 20 IU/L (ref 0–40)
Albumin: 4.5 g/dL (ref 3.8–4.8)
Alkaline Phosphatase: 97 IU/L (ref 44–121)
BUN/Creatinine Ratio: 12 (ref 12–28)
BUN: 9 mg/dL (ref 8–27)
Bilirubin Total: 0.4 mg/dL (ref 0.0–1.2)
CO2: 23 mmol/L (ref 20–29)
Calcium: 9.5 mg/dL (ref 8.7–10.3)
Chloride: 98 mmol/L (ref 96–106)
Creatinine, Ser: 0.76 mg/dL (ref 0.57–1.00)
Globulin, Total: 2.1 g/dL (ref 1.5–4.5)
Glucose: 93 mg/dL (ref 70–99)
Potassium: 3.8 mmol/L (ref 3.5–5.2)
Sodium: 139 mmol/L (ref 134–144)
Total Protein: 6.6 g/dL (ref 6.0–8.5)
eGFR: 81 mL/min/1.73 (ref >59)

## 2023-08-17 LAB — HEMOGLOBIN A1C
Est. average glucose Bld gHb Est-mCnc: 137 mg/dL
Hgb A1c MFr Bld: 6.4 % — ABNORMAL HIGH (ref 4.8–5.6)

## 2023-08-17 LAB — LIPID PANEL
Chol/HDL Ratio: 2.2 ratio (ref 0.0–4.4)
Cholesterol, Total: 143 mg/dL (ref 100–199)
HDL: 64 mg/dL (ref >39)
LDL Chol Calc (NIH): 64 mg/dL (ref 0–99)
Triglycerides: 76 mg/dL (ref 0–149)
VLDL Cholesterol Cal: 15 mg/dL (ref 5–40)

## 2023-08-19 LAB — CMP14+EGFR

## 2023-08-19 LAB — CBC WITH DIFFERENTIAL/PLATELET

## 2023-08-19 LAB — LIPID PANEL

## 2023-08-19 LAB — MICROALBUMIN / CREATININE URINE RATIO
Creatinine, Urine: 20.6 mg/dL
Microalb/Creat Ratio: <15 mg/g{creat} (ref 0–29)
Microalbumin, Urine: <3 ug/mL

## 2023-08-19 LAB — HEMOGLOBIN A1C

## 2023-08-25 ENCOUNTER — Other Ambulatory Visit: Payer: Self-pay | Admitting: Nurse Practitioner

## 2023-08-25 DIAGNOSIS — E038 Other specified hypothyroidism: Secondary | ICD-10-CM

## 2023-08-29 ENCOUNTER — Other Ambulatory Visit: Payer: Self-pay | Admitting: Cardiology

## 2023-09-27 ENCOUNTER — Other Ambulatory Visit: Payer: Self-pay

## 2023-09-27 DIAGNOSIS — F02B18 Dementia in other diseases classified elsewhere, moderate, with other behavioral disturbance: Secondary | ICD-10-CM

## 2023-09-27 MED ORDER — DONEPEZIL HCL 5 MG PO TABS: 5.0000 mg | ORAL_TABLET | Freq: Every day | ORAL | 1 refills | Status: AC

## 2023-10-13 ENCOUNTER — Other Ambulatory Visit: Payer: Self-pay | Admitting: Internal Medicine

## 2023-10-13 DIAGNOSIS — N3941 Urge incontinence: Secondary | ICD-10-CM

## 2023-10-21 ENCOUNTER — Other Ambulatory Visit: Payer: Self-pay | Admitting: Internal Medicine

## 2023-10-21 DIAGNOSIS — E782 Mixed hyperlipidemia: Secondary | ICD-10-CM

## 2023-11-16 ENCOUNTER — Ambulatory Visit: Admitting: Nurse Practitioner

## 2023-11-25 ENCOUNTER — Other Ambulatory Visit: Payer: Self-pay | Admitting: Internal Medicine

## 2023-11-25 DIAGNOSIS — E1169 Type 2 diabetes mellitus with other specified complication: Secondary | ICD-10-CM

## 2023-11-27 ENCOUNTER — Other Ambulatory Visit: Payer: Self-pay | Admitting: Internal Medicine

## 2023-11-27 DIAGNOSIS — I480 Paroxysmal atrial fibrillation: Secondary | ICD-10-CM

## 2023-12-21 DIAGNOSIS — F3181 Bipolar II disorder: Secondary | ICD-10-CM | POA: Diagnosis not present

## 2024-01-05 DIAGNOSIS — E890 Postprocedural hypothyroidism: Secondary | ICD-10-CM | POA: Diagnosis not present

## 2024-01-06 LAB — TSH: TSH: 48.7 u[IU]/mL — ABNORMAL HIGH (ref 0.450–4.500)

## 2024-01-06 LAB — T4, FREE: Free T4: 0.95 ng/dL (ref 0.82–1.77)

## 2024-01-10 NOTE — Progress Notes (Signed)
° °  01/10/2024  Patient ID: [name], female   DOB: 1945-06-18, 78 y.o.   MRN: [id number]  Pharmacy Quality Measure Review  This patient is appearing on a report for being at risk of failing the adherence measure for diabetes medications this calendar year.   Medication: metformin  Last fill date: 12/28/23 for 30 day supply  Insurance report was not up to date. No action needed at this time.   Lang Sieve, PharmD, BCGP Clinical Pharmacist  [phone]

## 2024-01-12 ENCOUNTER — Ambulatory Visit: Admitting: Nurse Practitioner

## 2024-01-16 ENCOUNTER — Encounter: Payer: Self-pay | Admitting: Nurse Practitioner

## 2024-01-16 ENCOUNTER — Ambulatory Visit: Admitting: Nurse Practitioner

## 2024-01-16 VITALS — BP 106/76 | HR 66 | Ht 61.5 in | Wt 197.8 lb

## 2024-01-16 DIAGNOSIS — E890 Postprocedural hypothyroidism: Secondary | ICD-10-CM | POA: Diagnosis not present

## 2024-01-16 DIAGNOSIS — E038 Other specified hypothyroidism: Secondary | ICD-10-CM

## 2024-01-16 MED ORDER — LEVOTHYROXINE SODIUM 125 MCG PO TABS: 125.0000 ug | ORAL_TABLET | Freq: Every day | ORAL | 1 refills | Status: AC

## 2024-01-16 NOTE — Patient Instructions (Signed)

## 2024-01-16 NOTE — Progress Notes (Signed)
 "                                                                    Endocrinology Follow Up Note                                         01/16/2024, 2:30 PM  Subjective:   Subjective    [name] is a 78 y.o.-year-old female patient being seen in follow up after being seen in consultation for hypothyroidism referred by Tobie Suzzane POUR, MD.     Past Medical History:  Diagnosis Date   Allergic rhinitis    Arthritis    B12 deficiency    Bipolar disorder (HCC)    Coronary artery disease    Depression    Diabetes mellitus (HCC)    Diabetes mellitus (HCC) 10/05/2021   Essential hypertension    GERD (gastroesophageal reflux disease)    Hashimoto's thyroiditis    History of transient ischemic attack (TIA)    Or possibly migraine as well as right amaurosis fugax and ataxia - Dr. Maurice   Hyperlipidemia    Hypothyroidism    PSVT (paroxysmal supraventricular tachycardia)    Right bundle branch block     Past Surgical History:  Procedure Laterality Date   CATARACT EXTRACTION W/PHACO Left 05/23/2020   Procedure: CATARACT EXTRACTION PHACO AND INTRAOCULAR LENS PLACEMENT LEFT EYE;  Surgeon: Harrie Agent, MD;  Location: [facility];  Service: Ophthalmology;  Laterality: Left;  left CDE=15.69   CATARACT EXTRACTION W/PHACO Right 06/13/2020   Procedure: CATARACT EXTRACTION PHACO AND INTRAOCULAR LENS PLACEMENT RIGHT EYE;  Surgeon: Harrie Agent, MD;  Location: [facility];  Service: Ophthalmology;  Laterality: Right;  right CDE=11.33   COLONOSCOPY WITH PROPOFOL  N/A 04/22/2022   Procedure: COLONOSCOPY WITH PROPOFOL ;  Surgeon: Shaaron Lamar HERO, MD;  Location: [facility];  Service: Endoscopy;  Laterality: N/A;  8:30 am, asa 3   LAPAROSCOPIC CHOLECYSTECTOMY  2010   POLYPECTOMY  04/22/2022   Procedure: POLYPECTOMY INTESTINAL;  Surgeon: Shaaron Lamar HERO, MD;  Location: [facility];  Service: Endoscopy;;   THYROIDECTOMY  2009   TOTAL KNEE ARTHROPLASTY Left 02/16/2021   Procedure: TOTAL KNEE  ARTHROPLASTY;  Surgeon: Melodi Lerner, MD;  Location: [facility];  Service: Orthopedics;  Laterality: Left;    Social History   Socioeconomic History   Marital status: Widowed    Spouse name: Not on file   Number of children: 2   Years of education: 46   Highest education level: Not on file  Occupational History   Occupation: homemaker    Employer: RETIRED  Tobacco Use   Smoking status: Former    Current packs/day: 0.00    Average packs/day: 0.5 packs/day for 10.0 years (5.0 ttl pk-yrs)    Types: Cigarettes    Start date: 01/25/1973    Quit date: 01/26/1983    Years since quitting: 41.0   Smokeless tobacco: Never   Tobacco comments:    quit 30 years ago  Vaping Use   Vaping status: Never Used  Substance and Sexual Activity   Alcohol use: No    Alcohol/week: 0.0 standard drinks of alcohol  Drug use: No   Sexual activity: Not Currently    Birth control/protection: Post-menopausal  Other Topics Concern   Not on file  Social History Narrative   Regular exercise: walk when ableCaffeine use: occasionally   Right handed   Drinks caffeine prn-coffee daily   One floor home   Lives alone with two cats   retired   Social Drivers of Health   Tobacco Use: Medium Risk (01/16/2024)   Patient History    Smoking Tobacco Use: Former    Smokeless Tobacco Use: Never    Passive Exposure: Not on file  Financial Resource Strain: Low Risk (02/13/2021)   Overall Financial Resource Strain (CARDIA)    Difficulty of Paying Living Expenses: Not hard at all  Food Insecurity: No Food Insecurity (01/12/2023)   Hunger Vital Sign    Worried About Running Out of Food in the Last Year: Never true    Ran Out of Food in the Last Year: Never true  Transportation Needs: No Transportation Needs (01/12/2023)   PRAPARE - Administrator, Civil Service (Medical): No    Lack of Transportation (Non-Medical): No  Physical Activity: Inactive (02/13/2021)   Exercise Vital Sign    Days of Exercise per  Week: 0 days    Minutes of Exercise per Session: 0 min  Stress: No Stress Concern Present (02/13/2021)   [employer] of Occupational [employer] - Occupational Stress Questionnaire    Feeling of Stress : Not at all  Social Connections: Unknown (06/01/2021)   Received from [facility]   Social Network    Social Network: Not on file  Depression (PHQ2-9): Low Risk (08/16/2023)   Depression (PHQ2-9)    PHQ-2 Score: 0  Alcohol Screen: Low Risk (02/13/2021)   Alcohol Screen    Last Alcohol Screening Score (AUDIT): 0  Housing: Low Risk (01/12/2023)   Housing Stability Vital Sign    Unable to Pay for Housing in the Last Year: No    Number of Times Moved in the Last Year: 0    Homeless in the Last Year: No  Utilities: Not At Risk (01/12/2023)   [facility] Utilities    Threatened with loss of utilities: No  Health Literacy: Not on file    Family History  Problem Relation Age of Onset   Stroke Mother    Thyroid  disease Mother    Depression Father    Hypertension Sister    Aneurysm Sister    Diabetes Brother    Heart attack Brother    Hypertension Brother    Heart failure Brother    Thyroid  disease Brother    Stroke Maternal Grandmother    Heart failure Maternal Grandmother    Heart failure Maternal Grandfather    Stroke Maternal Grandfather    Heart failure Paternal Grandmother    Cancer - Colon Neg Hx     Outpatient Encounter Medications as of 01/16/2024  Medication Sig   acetaminophen  (TYLENOL ) 500 MG tablet Take 1,000 mg by mouth every 6 (six) hours as needed for moderate pain or headache.   albuterol  (VENTOLIN  HFA) 108 (90 Base) MCG/ACT inhaler Inhale 2 puffs into the lungs every 6 (six) hours as needed for wheezing or shortness of breath.   alendronate  (FOSAMAX ) 70 MG tablet TAKE 1 TABLET EVERY WEEK IN THE MORNING 30 MIN BEFORE EATING WITH AN 8OZ GLASS OF WATER  (SIT UP 30 MIN)   atorvastatin  (LIPITOR ) 80 MG tablet Take 1 tablet (80 mg total) by mouth daily.   benzonatate   (  TESSALON ) 100 MG capsule Take 1 capsule (100 mg total) by mouth 2 (two) times daily as needed for cough.   blood glucose meter kit and supplies KIT Dispense based on patient and insurance preference. Use up to four times daily as directed.   buPROPion  (WELLBUTRIN  XL) 300 MG 24 hr tablet Take 300 mg by mouth daily.    clonazePAM (KLONOPIN) 0.5 MG tablet Take 0.25-0.5 mg by mouth in the morning, at noon, and at bedtime.   diltiazem  (CARDIZEM  CD) 240 MG 24 hr capsule TAKE (1) CAPSULE BY MOUTH ONCE DAILY.   donepezil  (ARICEPT ) 5 MG tablet Take 1 tablet (5 mg total) by mouth at bedtime.   DULoxetine  (CYMBALTA ) 20 MG capsule Take 20 mg by mouth 2 (two) times daily.   ELIQUIS  5 MG TABS tablet TAKE ONE TABLET BY MOUTH TWICE DAILY   glucose blood (ONETOUCH ULTRA TEST) test strip 1 each by Other route in the morning, at noon, and at bedtime. Use as instructed   Lancets (ONETOUCH DELICA PLUS LANCET33G) MISC USE TO TEST BLOOD SUGAR TWICE DAILY.   metFORMIN  (GLUCOPHAGE ) 500 MG tablet Take 1 tablet (500 mg total) by mouth 2 (two) times daily with a meal.   MYRBETRIQ  25 MG TB24 tablet TAKE ONE TABLET BY MOUTH EVERY DAY   ondansetron  (ZOFRAN ) 4 MG tablet Take 1 tablet (4 mg total) by mouth every 8 (eight) hours as needed for nausea or vomiting.   pantoprazole  (PROTONIX ) 40 MG tablet Take 1 tablet (40 mg total) by mouth daily.   [DISCONTINUED] levothyroxine  (SYNTHROID ) 125 MCG tablet TAKE ONE TABLET BY MOUTH EVERY DAY BEFORE BREAKFAST   levothyroxine  (SYNTHROID ) 125 MCG tablet Take 1 tablet (125 mcg total) by mouth daily before breakfast.   No facility-administered encounter medications on file as of 01/16/2024.    ALLERGIES: Allergies  Allergen Reactions   Amoxicillin-Pot Clavulanate Rash   Atenolol  Rash   Penicillins Rash    Tolerated Cephalosporin Date: 02/17/21.     Sulfonamide Derivatives Rash   VACCINATION STATUS: Immunization History  Administered Date(s) Administered   Fluad Quad(high Dose  65+) 10/30/2020, 10/05/2021   Fluad Trivalent(High Dose 65+) 10/06/2022   Influenza,inj,quad, With Preservative 10/26/2018   Moderna Sars-Covid-2 Vaccination 02/26/2019, 03/19/2019   Pneumococcal Conjugate-13 04/21/2020   Pneumococcal Polysaccharide-23 05/26/2021   Tdap 06/10/2021   Zoster Recombinant(Shingrix) 06/10/2021, 10/07/2021     HPI   [name]  is a patient with the above medical history. she was diagnosed with hypothyroidism at approximate age of 57 years (after total thyroidectomy- done after abnormal biopsy-determined benign), which required subsequent initiation of thyroid  hormone replacement therapy. she was given various doses of Levothyroxine  over the years, currently prescribed 125 micrograms.    She does have memory impairment and lives alone.  She states her son lives nearby and daughter lives in [place] and that they were discussing whether or not to hire some help for her in the house 3 days a week.  She does not have any routine schedule with her day.  She receives the majority of her medication in pill packs.  I reviewed patient's thyroid  tests:  Lab Results  Component Value Date   TSH 48.700 (H) 01/05/2024   TSH 6.340 (H) 06/30/2023   TSH 6.090 (H) 03/18/2023   TSH 3.674 01/07/2023   TSH 7.040 (H) 12/13/2022   TSH 0.607 09/09/2022   TSH 10.300 (H) 06/03/2022   TSH 8.640 (H) 03/30/2022   TSH 17.800 (H) 02/01/2022   TSH 1.390 10/05/2021  FREET4 0.95 01/05/2024   FREET4 1.57 06/30/2023   FREET4 1.75 03/18/2023   FREET4 1.50 12/13/2022   FREET4 2.07 (H) 09/09/2022   FREET4 1.10 06/03/2022   FREET4 1.45 03/30/2022   FREET4 1.17 02/01/2022   FREET4 1.59 10/05/2021   FREET4 2.25 (H) 07/01/2021    Pt denies feeling nodules in neck, hoarseness, dysphagia/odynophagia, SOB with lying down.  She did have thyroid  ultrasound 1 year post total thyroidectomy and no residual tissue was noted.  I reviewed her chart and she also has a history of OSA, A  Flutter, GERD, IBS, dementia, osteoporosis, OA, Bipolar disorder, iron deficiency anemia, HLD and low vitamin D.  ROS:  Constitutional: no weight gain/loss, + fatigue-improved somewhat, no subjective hyperthermia, no subjective hypothermia, + memory impairment (sees neurology for dementia)- now has aide in the home Eyes: no blurry vision, no xerophthalmia ENT: no sore throat, no nodules palpated in throat, no dysphagia/odynophagia, no hoarseness Cardiovascular: no chest pain, no SOB, no leg swelling Respiratory: no cough, no SOB Gastrointestinal: no nausea/vomiting/diarrhea, + intermittent constipation Musculoskeletal: no muscle/joint aches Skin: no rashes, + excessive sweating-somewhat improved Neurological: no tremors, no numbness, no tingling, no dizziness Psychiatric: no depression, no anxiety (hx Bipolar disorder- sees psych)   Objective:   Objective     BP 106/76 (BP Location: Right Arm, Patient Position: Sitting, Cuff Size: Large)   Pulse 66   Ht 5' 1.5 (1.562 m)   Wt 197 lb 12.8 oz (89.7 kg)   BMI 36.77 kg/m  Wt Readings from Last 3 Encounters:  01/16/24 197 lb 12.8 oz (89.7 kg)  08/16/23 194 lb (88 kg)  07/12/23 188 lb 6.4 oz (85.5 kg)    BP Readings from Last 3 Encounters:  01/16/24 106/76  08/16/23 139/82  07/12/23 116/72      Physical Exam- Limited  Constitutional:  Body mass index is 36.77 kg/m. , not in acute distress, normal state of mind Eyes:  EOMI, no exophthalmos Musculoskeletal: no gross deformities, strength intact in all four extremities, no gross restriction of joint movements Skin:  no rashes, no hyperemia Neurological: no tremor with outstretched hands   CMP ( most recent) CMP     Component Value Date/Time   NA 139 08/16/2023 1607   K 3.8 08/16/2023 1607   CL 98 08/16/2023 1607   CO2 23 08/16/2023 1607   GLUCOSE 93 08/16/2023 1607   GLUCOSE 120 (H) 01/11/2023 0131   BUN 9 08/16/2023 1607   CREATININE 0.76 08/16/2023 1607    CREATININE 0.62 07/23/2014 1548   CALCIUM  9.5 08/16/2023 1607   PROT 6.6 08/16/2023 1607   ALBUMIN 4.5 08/16/2023 1607   AST 20 08/16/2023 1607   ALT 23 08/16/2023 1607   ALKPHOS 97 08/16/2023 1607   BILITOT 0.4 08/16/2023 1607   EGFR 81 08/16/2023 1607   GFRNONAA >60 01/11/2023 0131     Diabetic Labs (most recent): Lab Results  Component Value Date   HGBA1C 6.4 (H) 08/16/2023   HGBA1C CANCELED 08/16/2023   HGBA1C 6.0 (H) 03/18/2023     Lipid Panel ( most recent) Lipid Panel     Component Value Date/Time   CHOL 143 08/16/2023 1607   TRIG 76 08/16/2023 1607   HDL 64 08/16/2023 1607   CHOLHDL 2.2 08/16/2023 1607   LDLCALC 64 08/16/2023 1607   LABVLDL 15 08/16/2023 1607       Lab Results  Component Value Date   TSH 48.700 (H) 01/05/2024   TSH 6.340 (H) 06/30/2023   TSH  6.090 (H) 03/18/2023   TSH 3.674 01/07/2023   TSH 7.040 (H) 12/13/2022   TSH 0.607 09/09/2022   TSH 10.300 (H) 06/03/2022   TSH 8.640 (H) 03/30/2022   TSH 17.800 (H) 02/01/2022   TSH 1.390 10/05/2021   FREET4 0.95 01/05/2024   FREET4 1.57 06/30/2023   FREET4 1.75 03/18/2023   FREET4 1.50 12/13/2022   FREET4 2.07 (H) 09/09/2022   FREET4 1.10 06/03/2022   FREET4 1.45 03/30/2022   FREET4 1.17 02/01/2022   FREET4 1.59 10/05/2021   FREET4 2.25 (H) 07/01/2021     Latest Reference Range & Units 12/13/22 13:06 01/07/23 11:09 03/18/23 11:26 06/30/23 10:38 01/05/24 13:09  TSH 0.450 - 4.500 uIU/mL 7.040 (H) 3.674 6.090 (H) 6.340 (H) 48.700 (H)  Triiodothyronine,Free,Serum 2.0 - 4.4 pg/mL   2.1    T4,Free(Direct) 0.82 - 1.77 ng/dL 8.49  8.24 8.42 9.04  (H): Data is abnormally high  Assessment & Plan:   ASSESSMENT / PLAN:  1. Hypothyroidism- postsurgical  Patient with long-standing hypothyroidism, on levothyroxine  therapy.      Her previsit TFTs are consistent with under-replacement with high TSH and low normal Free T4, however historically she did not tolerate the 137 mcg.  I am thinking this  may be related to missing some doses or taking it improperly.   The hired caregiver notes Curley is responsible for taking her thyroid  pill each morning.  I did suggest getting pill organizer for her thyroid  medication so the caregiver can check when she comes in to make sure she took it (rather than counting the pills in her bottle).  She is advised to continue Levothyroxine  125 mcg po daily before breakfast.    - We discussed about correct intake of levothyroxine , at fasting, with water , separated by at least 30 minutes from breakfast, and separated by more than 4 hours from calcium , iron, multivitamins, acid reflux medications (PPIs). -Patient is made aware of the fact that thyroid  hormone replacement is needed for life, dose to be adjusted by periodic monitoring of thyroid  function tests.  - Will check thyroid  tests before next visit: TSH, free T4.      I spent  22  minutes in the care of the patient today including review of labs from Thyroid  Function, CMP, and other relevant labs ; imaging/biopsy records (current and previous including abstractions from other facilities); face-to-face time discussing  her lab results and symptoms, medications doses, her options of short and long term treatment based on the latest standards of care / guidelines;   and documenting the encounter.  Dagoberto GORMAN Idler  participated in the discussions, expressed understanding, and voiced agreement with the above plans.  All questions were answered to her satisfaction. she is encouraged to contact clinic should she have any questions or concerns prior to her return visit.   FOLLOW UP PLAN:  Return in about 3 months (around 04/15/2024) for Thyroid  follow up, Previsit labs.  Benton Rio, [facility] [address] [place], [place] [address] Phone: [phone] Fax: [phone]  01/16/2024, 2:30 PM  "

## 2024-01-24 ENCOUNTER — Encounter: Payer: Self-pay | Admitting: Internal Medicine

## 2024-01-30 ENCOUNTER — Ambulatory Visit: Payer: Self-pay | Admitting: Internal Medicine

## 2024-02-07 ENCOUNTER — Encounter: Payer: Self-pay | Admitting: Internal Medicine

## 2024-02-07 ENCOUNTER — Ambulatory Visit: Payer: Self-pay | Admitting: Internal Medicine

## 2024-02-07 VITALS — BP 136/57 | HR 70 | Ht 61.5 in | Wt 201.0 lb

## 2024-02-07 DIAGNOSIS — F3170 Bipolar disorder, currently in remission, most recent episode unspecified: Secondary | ICD-10-CM

## 2024-02-07 DIAGNOSIS — E782 Mixed hyperlipidemia: Secondary | ICD-10-CM

## 2024-02-07 DIAGNOSIS — Z23 Encounter for immunization: Secondary | ICD-10-CM

## 2024-02-07 DIAGNOSIS — F02B18 Dementia in other diseases classified elsewhere, moderate, with other behavioral disturbance: Secondary | ICD-10-CM

## 2024-02-07 DIAGNOSIS — Z6837 Body mass index (BMI) 37.0-37.9, adult: Secondary | ICD-10-CM

## 2024-02-07 DIAGNOSIS — E890 Postprocedural hypothyroidism: Secondary | ICD-10-CM | POA: Diagnosis not present

## 2024-02-07 DIAGNOSIS — I480 Paroxysmal atrial fibrillation: Secondary | ICD-10-CM | POA: Diagnosis not present

## 2024-02-07 DIAGNOSIS — Z7984 Long term (current) use of oral hypoglycemic drugs: Secondary | ICD-10-CM

## 2024-02-07 DIAGNOSIS — I10 Essential (primary) hypertension: Secondary | ICD-10-CM | POA: Diagnosis not present

## 2024-02-07 DIAGNOSIS — G309 Alzheimer's disease, unspecified: Secondary | ICD-10-CM | POA: Diagnosis not present

## 2024-02-07 DIAGNOSIS — E66812 Obesity, class 2: Secondary | ICD-10-CM | POA: Diagnosis not present

## 2024-02-07 DIAGNOSIS — E1169 Type 2 diabetes mellitus with other specified complication: Secondary | ICD-10-CM | POA: Diagnosis not present

## 2024-02-07 MED ORDER — TIRZEPATIDE 2.5 MG/0.5ML ~~LOC~~ SOAJ: 2.5000 mg | SUBCUTANEOUS | 0 refills | Status: AC

## 2024-02-07 NOTE — Assessment & Plan Note (Signed)
 Currently in sinus rhythm On diltiazem  240 mg for rate control On Eliquis  5 mg BID for AC Followed by cardiology

## 2024-02-07 NOTE — Progress Notes (Signed)
 "  Established Patient Office Visit  Subjective:  Patient ID: [name], female    DOB: 26-Dec-1945  Age: 79 y.o. MRN: [id number]  CC:  Chief Complaint  Patient presents with   Medical Management of Chronic Issues    Follow up     HPI [name] is a 79 y.o. female with past medical history of HTN, PSVT, GERD, hypothyroidism, HLD, mood disorder, OA and obesity who presents for f/u of her chronic medical conditions.  Hypothyroidism: She has been taking levothyroxine  125 mcg QD regularly.  Her TSH was significantly elevated recently, likely due to noncompliance. She reports chronic fatigue as well. Denies any recent change in appetite or weight.  Denies any tremors or palpitations currently.  Denies any fever, chills or LAD.   HTN: BP is well-controlled.  Patient denies headache, dizziness, chest pain, dyspnea or palpitations.  Atrial fibrillation: She developed atrial fibrillation during hospitalization in 03/24, was placed on amiodarone  and Eliquis  initially.  She takes diltiazem  240 mg QD and Eliquis  5 mg twice daily now.  Type II DM: Her last HbA1c was 6.4 in 07/25.  She is on metformin , but reports intermittent diarrhea due to it.  She is willing to start Mounjaro  now.  Denies any polyuria or polyphagia currently.  Bipolar disorder: She is currently on Cymbalta  and Wellbutrin .  She also takes Klonopin for anxiety.  Followed by psychiatry.  She has chronic fatigue, but denies insomnia, SI or HI currently.  Denies any recent manic episodes.  She reports memory concerns.  Has had episodes of confusion while driving in her neighborhood, and has almost got lost in her neighborhood.  She has had instances when she got ready for her doctors appointment on wrong days.  She has been taking donepezil  now.  She has been evaluated by neurology, but could not pursue further workup due to transportation issue to [place]. She is currently independent for ADLs.  She lives by herself.  She  relies on her brother for transportation.   Past Medical History:  Diagnosis Date   Allergic rhinitis    Arthritis    B12 deficiency    Bipolar disorder (HCC)    Coronary artery disease    Depression    Diabetes mellitus (HCC)    Diabetes mellitus (HCC) 10/05/2021   Essential hypertension    GERD (gastroesophageal reflux disease)    Hashimoto's thyroiditis    History of transient ischemic attack (TIA)    Or possibly migraine as well as right amaurosis fugax and ataxia - Dr. Maurice   Hyperlipidemia    Hypothyroidism    PSVT (paroxysmal supraventricular tachycardia)    Right bundle branch block     Past Surgical History:  Procedure Laterality Date   CATARACT EXTRACTION W/PHACO Left 05/23/2020   Procedure: CATARACT EXTRACTION PHACO AND INTRAOCULAR LENS PLACEMENT LEFT EYE;  Surgeon: Harrie Agent, MD;  Location: [facility];  Service: Ophthalmology;  Laterality: Left;  left CDE=15.69   CATARACT EXTRACTION W/PHACO Right 06/13/2020   Procedure: CATARACT EXTRACTION PHACO AND INTRAOCULAR LENS PLACEMENT RIGHT EYE;  Surgeon: Harrie Agent, MD;  Location: [facility];  Service: Ophthalmology;  Laterality: Right;  right CDE=11.33   COLONOSCOPY WITH PROPOFOL  N/A 04/22/2022   Procedure: COLONOSCOPY WITH PROPOFOL ;  Surgeon: Shaaron Lamar HERO, MD;  Location: [facility];  Service: Endoscopy;  Laterality: N/A;  8:30 am, asa 3   LAPAROSCOPIC CHOLECYSTECTOMY  2010   POLYPECTOMY  04/22/2022   Procedure: POLYPECTOMY INTESTINAL;  Surgeon: Shaaron Lamar HERO, MD;  Location: [facility];  Service: Endoscopy;;   THYROIDECTOMY  2009   TOTAL KNEE ARTHROPLASTY Left 02/16/2021   Procedure: TOTAL KNEE ARTHROPLASTY;  Surgeon: Melodi Lerner, MD;  Location: [facility];  Service: Orthopedics;  Laterality: Left;    Family History  Problem Relation Age of Onset   Stroke Mother    Thyroid  disease Mother    Depression Father    Hypertension Sister    Aneurysm Sister    Diabetes Brother    Heart attack Brother     Hypertension Brother    Heart failure Brother    Thyroid  disease Brother    Stroke Maternal Grandmother    Heart failure Maternal Grandmother    Heart failure Maternal Grandfather    Stroke Maternal Grandfather    Heart failure Paternal Grandmother    Cancer - Colon Neg Hx     Social History   Socioeconomic History   Marital status: Widowed    Spouse name: Not on file   Number of children: 2   Years of education: 66   Highest education level: Not on file  Occupational History   Occupation: homemaker    Employer: RETIRED  Tobacco Use   Smoking status: Former    Current packs/day: 0.00    Average packs/day: 0.5 packs/day for 10.0 years (5.0 ttl pk-yrs)    Types: Cigarettes    Start date: 01/25/1973    Quit date: 01/26/1983    Years since quitting: 41.0   Smokeless tobacco: Never   Tobacco comments:    quit 30 years ago  Vaping Use   Vaping status: Never Used  Substance and Sexual Activity   Alcohol use: No    Alcohol/week: 0.0 standard drinks of alcohol   Drug use: No   Sexual activity: Not Currently    Birth control/protection: Post-menopausal  Other Topics Concern   Not on file  Social History Narrative   Regular exercise: walk when ableCaffeine use: occasionally   Right handed   Drinks caffeine prn-coffee daily   One floor home   Lives alone with two cats   retired   Social Drivers of Health   Tobacco Use: Medium Risk (02/07/2024)   Patient History    Smoking Tobacco Use: Former    Smokeless Tobacco Use: Never    Passive Exposure: Not on Actuary Strain: Low Risk (02/13/2021)   Overall Financial Resource Strain (CARDIA)    Difficulty of Paying Living Expenses: Not hard at all  Food Insecurity: No Food Insecurity (01/12/2023)   Hunger Vital Sign    Worried About Running Out of Food in the Last Year: Never true    Ran Out of Food in the Last Year: Never true  Transportation Needs: No Transportation Needs (01/12/2023)   PRAPARE -  Administrator, Civil Service (Medical): No    Lack of Transportation (Non-Medical): No  Physical Activity: Inactive (02/13/2021)   Exercise Vital Sign    Days of Exercise per Week: 0 days    Minutes of Exercise per Session: 0 min  Stress: No Stress Concern Present (02/13/2021)   [employer] of Occupational Health - Occupational Stress Questionnaire    Feeling of Stress : Not at all  Social Connections: Unknown (06/01/2021)   [facility]   Social Network    Social Network: Not on file  Intimate Partner Violence: Patient Unable To Answer (01/12/2023)   Humiliation, Afraid, Rape, and Kick questionnaire    Fear of Current or Ex-Partner:  Patient unable to answer    Emotionally Abused: Patient unable to answer    Physically Abused: Patient unable to answer    Sexually Abused: Patient unable to answer  Depression (PHQ2-9): Low Risk (08/16/2023)   Depression (PHQ2-9)    PHQ-2 Score: 0  Alcohol Screen: Low Risk (02/13/2021)   Alcohol Screen    Last Alcohol Screening Score (AUDIT): 0  Housing: Low Risk (01/12/2023)   Housing Stability Vital Sign    Unable to Pay for Housing in the Last Year: No    Number of Times Moved in the Last Year: 0    Homeless in the Last Year: No  Utilities: Not At Risk (01/12/2023)   AHC Utilities    Threatened with loss of utilities: No  Health Literacy: Not on file    Outpatient Medications Prior to Visit  Medication Sig Dispense Refill   acetaminophen  (TYLENOL ) 500 MG tablet Take 1,000 mg by mouth every 6 (six) hours as needed for moderate pain or headache.     albuterol  (VENTOLIN  HFA) 108 (90 Base) MCG/ACT inhaler Inhale 2 puffs into the lungs every 6 (six) hours as needed for wheezing or shortness of breath. 18 g 0   alendronate  (FOSAMAX ) 70 MG tablet TAKE 1 TABLET EVERY WEEK IN THE MORNING 30 MIN BEFORE EATING WITH AN 8OZ GLASS OF WATER  (SIT UP 30 MIN) 4 tablet 11   atorvastatin  (LIPITOR ) 80 MG tablet Take 1 tablet (80 mg  total) by mouth daily. 90 tablet 3   benzonatate  (TESSALON ) 100 MG capsule Take 1 capsule (100 mg total) by mouth 2 (two) times daily as needed for cough. 20 capsule 0   blood glucose meter kit and supplies KIT Dispense based on patient and insurance preference. Use up to four times daily as directed. 1 each 0   buPROPion  (WELLBUTRIN  XL) 300 MG 24 hr tablet Take 300 mg by mouth daily.      clonazePAM (KLONOPIN) 0.5 MG tablet Take 0.25-0.5 mg by mouth in the morning, at noon, and at bedtime.     diltiazem  (CARDIZEM  CD) 240 MG 24 hr capsule TAKE (1) CAPSULE BY MOUTH ONCE DAILY. 90 capsule 3   donepezil  (ARICEPT ) 5 MG tablet Take 1 tablet (5 mg total) by mouth at bedtime. 90 tablet 1   DULoxetine  (CYMBALTA ) 20 MG capsule Take 20 mg by mouth 2 (two) times daily.     ELIQUIS  5 MG TABS tablet TAKE ONE TABLET BY MOUTH TWICE DAILY 60 tablet 5   glucose blood (ONETOUCH ULTRA TEST) test strip 1 each by Other route in the morning, at noon, and at bedtime. Use as instructed 100 strip 11   Lancets (ONETOUCH DELICA PLUS LANCET33G) MISC USE TO TEST BLOOD SUGAR TWICE DAILY. 100 each 0   levothyroxine  (SYNTHROID ) 125 MCG tablet Take 1 tablet (125 mcg total) by mouth daily before breakfast. 90 tablet 1   metFORMIN  (GLUCOPHAGE ) 500 MG tablet Take 1 tablet (500 mg total) by mouth 2 (two) times daily with a meal. 60 tablet 5   MYRBETRIQ  25 MG TB24 tablet TAKE ONE TABLET BY MOUTH EVERY DAY 30 tablet 5   ondansetron  (ZOFRAN ) 4 MG tablet Take 1 tablet (4 mg total) by mouth every 8 (eight) hours as needed for nausea or vomiting. 20 tablet 0   pantoprazole  (PROTONIX ) 40 MG tablet Take 1 tablet (40 mg total) by mouth daily. 90 tablet 3   No facility-administered medications prior to visit.    Allergies  Allergen Reactions   Amoxicillin-Pot  Clavulanate Rash   Atenolol  Rash   Penicillins Rash    Tolerated Cephalosporin Date: 02/17/21.     Sulfonamide Derivatives Rash    ROS Review of Systems  Constitutional:   Positive for fatigue. Negative for chills and fever.  HENT:  Negative for congestion, sinus pressure and sinus pain.   Eyes:  Negative for pain and discharge.  Respiratory:  Negative for cough and shortness of breath.   Cardiovascular:  Negative for chest pain and palpitations.  Gastrointestinal:  Negative for diarrhea, nausea and vomiting.  Genitourinary:  Negative for dysuria and hematuria.  Musculoskeletal:  Negative for neck pain and neck stiffness.  Skin:  Negative for rash.  Neurological:  Negative for dizziness and syncope.  Psychiatric/Behavioral:  Positive for confusion. Negative for agitation and behavioral problems. The patient is nervous/anxious.       Objective:    Physical Exam Vitals reviewed.  Constitutional:      General: She is not in acute distress.    Appearance: She is obese. She is not diaphoretic.  HENT:     Head: Normocephalic and atraumatic.     Nose: No congestion.     Mouth/Throat:     Mouth: Mucous membranes are moist.     Pharynx: No posterior oropharyngeal erythema.  Eyes:     General: No scleral icterus.    Extraocular Movements: Extraocular movements intact.  Cardiovascular:     Rate and Rhythm: Normal rate and regular rhythm.     Heart sounds: Normal heart sounds. No murmur heard. Pulmonary:     Breath sounds: Normal breath sounds. No wheezing or rales.  Musculoskeletal:     Cervical back: Neck supple. No tenderness.     Right lower leg: No edema.     Left lower leg: No edema.  Skin:    General: Skin is warm.     Findings: No rash.  Neurological:     General: No focal deficit present.     Mental Status: She is alert and oriented to person, place, and time.     Sensory: No sensory deficit.     Motor: No weakness.  Psychiatric:        Mood and Affect: Mood normal.        Behavior: Behavior normal.     BP (!) 136/57   Pulse 70   Ht 5' 1.5 (1.562 m)   Wt 201 lb (91.2 kg)   SpO2 91%   BMI 37.36 kg/m  Wt Readings from Last 3  Encounters:  02/07/24 201 lb (91.2 kg)  01/16/24 197 lb 12.8 oz (89.7 kg)  08/16/23 194 lb (88 kg)    Lab Results  Component Value Date   TSH 48.700 (H) 01/05/2024   Lab Results  Component Value Date   WBC 11.9 (H) 08/16/2023   HGB 14.0 08/16/2023   HCT 42.1 08/16/2023   MCV 97 08/16/2023   PLT 412 08/16/2023   Lab Results  Component Value Date   NA 139 08/16/2023   K 3.8 08/16/2023   CO2 23 08/16/2023   GLUCOSE 93 08/16/2023   BUN 9 08/16/2023   CREATININE 0.76 08/16/2023   BILITOT 0.4 08/16/2023   ALKPHOS 97 08/16/2023   AST 20 08/16/2023   ALT 23 08/16/2023   PROT 6.6 08/16/2023   ALBUMIN 4.5 08/16/2023   CALCIUM  9.5 08/16/2023   ANIONGAP 11 01/11/2023   EGFR 81 08/16/2023   Lab Results  Component Value Date   CHOL 143 08/16/2023   Lab Results  Component Value Date   HDL 64 08/16/2023   Lab Results  Component Value Date   LDLCALC 64 08/16/2023   Lab Results  Component Value Date   TRIG 76 08/16/2023   Lab Results  Component Value Date   CHOLHDL 2.2 08/16/2023   Lab Results  Component Value Date   HGBA1C 6.4 (H) 08/16/2023      Assessment & Plan:   Problem List Items Addressed This Visit       Cardiovascular and Mediastinum   Essential hypertension   BP Readings from Last 1 Encounters:  02/07/24 (!) 136/57   Well-controlled with Diltiazem  240 mg once daily Losartan  was discontinued in the recent hospitalization due to hyponatremia Counseled for compliance with the medications Advised DASH diet and moderate exercise/walking as tolerated      Relevant Orders   CMP14+EGFR   CBC   Paroxysmal atrial fibrillation (HCC)   Currently in sinus rhythm On diltiazem  240 mg for rate control On Eliquis  5 mg BID for AC Followed by cardiology        Endocrine   Postoperative hypothyroidism   Lab Results  Component Value Date   TSH 48.700 (H) 01/05/2024   With free T4 WNL On Levothyroxine  125 mcg QD, she was not taking it regularly - but  has started taking it regularly now Followed by endocrinology      Type 2 diabetes mellitus with other specified complication (HCC) - Primary   Lab Results  Component Value Date   HGBA1C 6.4 (H) 08/16/2023   Associated with HTN and HLD Well-controlled On Metformin  500 mg BID, but has occasional diarrhea with it Started Mounjaro  2.5 mg qw, plan to increase dose as tolerated Advised to check her blood glucose when she has episodes of sweating and facial flushing-could be hypoglycemia Advised to follow diabetic diet On ARB and statin F/u CMP and lipid panel Diabetic eye exam: Advised to follow up with Ophthalmology for diabetic eye exam      Relevant Medications   tirzepatide  (MOUNJARO ) 2.5 MG/0.5ML Pen   Other Relevant Orders   CMP14+EGFR   Hemoglobin A1c   Bayer DCA Hb A1c Waived     Nervous and Auditory   DAT (dementia of Alzheimer type) (HCC)   MMSE: 24/30 previously Has had episodes of confusion, could be from multiple psychiatric medicines On donepezil  5 mg daily Referred to neurology, but could not pursue further workup due to transportation issue to [place].         Other   Bipolar disorder (HCC) (Chronic)   Followed by Dr. Jess On Wellbutrin  and Cymbalta  On clonazepam as needed      Hyperlipidemia   On atorvastatin  80 mg QD      Relevant Orders   Lipid Profile   Morbid obesity (HCC)   BMI Readings from Last 3 Encounters:  02/07/24 37.36 kg/m  01/16/24 36.77 kg/m  08/16/23 36.06 kg/m   Associated with HTN, type II DM and HLD Advised to follow low-carb diet and perform moderate exercise/walking as tolerated Started Mounjaro  for type II DM      Relevant Medications   tirzepatide  (MOUNJARO ) 2.5 MG/0.5ML Pen   Other Visit Diagnoses       Encounter for immunization       Relevant Orders   Flu vaccine HIGH DOSE PF(Fluzone Trivalent) (Completed)          Meds ordered this encounter  Medications   tirzepatide  (MOUNJARO ) 2.5 MG/0.5ML Pen     Sig: Inject 2.5 mg  into the skin once a week.    Dispense:  2 mL    Refill:  0    Follow-up: Return in about 3 months (around 05/07/2024) for DM.    Suzzane MARLA Blanch, MD "

## 2024-02-07 NOTE — Assessment & Plan Note (Signed)
 On atorvastatin 80 mg QD ?

## 2024-02-07 NOTE — Patient Instructions (Addendum)
 Please start taking Mounjaro  as prescribed.  Please stop taking Metformin  once you start Mounjaro .  Please continue to take medications as prescribed.  Please continue to follow low carb diet and perform moderate exercise/walking at least 150 mins/week.  Please get fasting blood tests done before the next visit.

## 2024-02-07 NOTE — Assessment & Plan Note (Signed)
 Followed by Dr. Betti Cruz On Wellbutrin and Cymbalta On clonazepam as needed

## 2024-02-07 NOTE — Assessment & Plan Note (Signed)
 MMSE: 24/30 previously Has had episodes of confusion, could be from multiple psychiatric medicines On donepezil  5 mg daily Referred to neurology, but could not pursue further workup due to transportation issue to [place].

## 2024-02-07 NOTE — Assessment & Plan Note (Signed)
 BMI Readings from Last 3 Encounters:  02/07/24 37.36 kg/m  01/16/24 36.77 kg/m  08/16/23 36.06 kg/m   Associated with HTN, type II DM and HLD Advised to follow low-carb diet and perform moderate exercise/walking as tolerated Started Mounjaro  for type II DM

## 2024-02-07 NOTE — Assessment & Plan Note (Signed)
 Lab Results  Component Value Date   TSH 48.700 (H) 01/05/2024   With free T4 WNL On Levothyroxine  125 mcg QD, she was not taking it regularly - but has started taking it regularly now Followed by endocrinology

## 2024-02-07 NOTE — Assessment & Plan Note (Addendum)
 Lab Results  Component Value Date   HGBA1C 6.4 (H) 08/16/2023   Associated with HTN and HLD Well-controlled On Metformin  500 mg BID, but has occasional diarrhea with it Started Mounjaro  2.5 mg qw, plan to increase dose as tolerated Advised to check her blood glucose when she has episodes of sweating and facial flushing-could be hypoglycemia Advised to follow diabetic diet On ARB and statin F/u CMP and lipid panel Diabetic eye exam: Advised to follow up with Ophthalmology for diabetic eye exam

## 2024-02-07 NOTE — Assessment & Plan Note (Signed)
 BP Readings from Last 1 Encounters:  02/07/24 (!) 136/57   Well-controlled with Diltiazem  240 mg once daily Losartan  was discontinued in the recent hospitalization due to hyponatremia Counseled for compliance with the medications Advised DASH diet and moderate exercise/walking as tolerated

## 2024-02-08 ENCOUNTER — Telehealth: Payer: Self-pay | Admitting: Pharmacy Technician

## 2024-02-08 ENCOUNTER — Other Ambulatory Visit (HOSPITAL_COMMUNITY): Payer: Self-pay

## 2024-02-08 NOTE — Telephone Encounter (Signed)
 Pt son informed

## 2024-02-08 NOTE — Telephone Encounter (Signed)
 Pharmacy Patient Advocate Encounter   Received notification from Patient Advice Request messages that prior authorization for Mounjaro  2.5mg /0.31ml auto-injectors is required/requested.   Insurance verification completed.   The patient is insured through [facility] ADVANTAGE/RX ADVANCE.   Per test claim: PA required; PA submitted to above mentioned insurance via Latent Key/confirmation #/EOC [id number] Status is pending

## 2024-02-08 NOTE — Telephone Encounter (Signed)
 Pharmacy Patient Advocate Encounter  Received notification from [facility] [employer]ANT[employer]/RX [employer]VANCE that Prior Authorization for  Mounjaro  2.5mg /0.21ml auto-injectors has been APPROVED from 02/08/2024 to 02/07/2025. Ran test claim, Copay is $213.12. This test claim was processed through [facility]- copay amounts may vary at other pharmacies due to pharmacy/plan contracts, or as the patient moves through the different stages of their insurance plan.   PA #/Case ID/Reference #: [id number]

## 2024-02-09 LAB — BAYER DCA HB A1C WAIVED: HB A1C (BAYER DCA - WAIVED): 6.1 % — ABNORMAL HIGH (ref 4.8–5.6)

## 2024-03-02 ENCOUNTER — Ambulatory Visit: Admitting: Cardiology

## 2024-05-01 ENCOUNTER — Ambulatory Visit: Admitting: Nurse Practitioner

## 2024-05-09 ENCOUNTER — Ambulatory Visit: Payer: Self-pay | Admitting: Internal Medicine
